# Patient Record
Sex: Female | Born: 1960 | Race: White | Hispanic: No | Marital: Married | Smoking: Never smoker
Health system: Southern US, Community
[De-identification: ages and names within clinical notes are randomized; demographics above are authoritative.]

## PROBLEM LIST (undated history)

## (undated) DIAGNOSIS — F419 Anxiety disorder, unspecified: Secondary | ICD-10-CM

## (undated) DIAGNOSIS — M26609 Unspecified temporomandibular joint disorder, unspecified side: Secondary | ICD-10-CM

## (undated) DIAGNOSIS — D649 Anemia, unspecified: Secondary | ICD-10-CM

## (undated) DIAGNOSIS — R079 Chest pain, unspecified: Secondary | ICD-10-CM

## (undated) DIAGNOSIS — K760 Fatty (change of) liver, not elsewhere classified: Secondary | ICD-10-CM

## (undated) DIAGNOSIS — IMO0001 Reserved for inherently not codable concepts without codable children: Secondary | ICD-10-CM

## (undated) DIAGNOSIS — I10 Essential (primary) hypertension: Secondary | ICD-10-CM

## (undated) DIAGNOSIS — K219 Gastro-esophageal reflux disease without esophagitis: Secondary | ICD-10-CM

## (undated) DIAGNOSIS — E7800 Pure hypercholesterolemia, unspecified: Secondary | ICD-10-CM

## (undated) HISTORY — PX: HERNIA REPAIR: SHX51

## (undated) HISTORY — DX: Pure hypercholesterolemia, unspecified: E78.00

## (undated) HISTORY — PX: SUBMANDIBULAR MASS EXCISION: SHX5310

## (undated) HISTORY — PX: ABDOMINAL HYSTERECTOMY: SHX81

## (undated) HISTORY — PX: KNEE SURGERY: SHX244

## (undated) HISTORY — PX: BILATERAL SALPINGOOPHORECTOMY: SHX1223

---

## 1994-03-14 HISTORY — PX: SUBMANDIBULAR MASS EXCISION: SHX5310

## 2004-05-12 ENCOUNTER — Ambulatory Visit: Payer: Self-pay | Admitting: Internal Medicine

## 2004-08-11 ENCOUNTER — Ambulatory Visit: Payer: Self-pay

## 2004-09-06 ENCOUNTER — Ambulatory Visit: Payer: Self-pay | Admitting: Unknown Physician Specialty

## 2005-08-02 ENCOUNTER — Ambulatory Visit: Payer: Self-pay | Admitting: Internal Medicine

## 2006-03-21 ENCOUNTER — Ambulatory Visit: Payer: Self-pay | Admitting: Gastroenterology

## 2006-03-29 ENCOUNTER — Other Ambulatory Visit: Payer: Self-pay

## 2006-04-04 ENCOUNTER — Ambulatory Visit: Payer: Self-pay | Admitting: Gastroenterology

## 2006-08-10 ENCOUNTER — Ambulatory Visit: Payer: Self-pay | Admitting: Internal Medicine

## 2007-03-15 HISTORY — PX: ABDOMINAL HYSTERECTOMY: SHX81

## 2007-03-15 HISTORY — PX: BILATERAL SALPINGOOPHORECTOMY: SHX1223

## 2007-04-18 ENCOUNTER — Ambulatory Visit: Payer: Self-pay | Admitting: Urology

## 2007-06-13 ENCOUNTER — Ambulatory Visit: Payer: Self-pay | Admitting: Oncology

## 2007-06-20 ENCOUNTER — Ambulatory Visit: Payer: Self-pay | Admitting: Oncology

## 2007-06-28 ENCOUNTER — Encounter: Admission: RE | Admit: 2007-06-28 | Discharge: 2007-06-28 | Payer: Self-pay | Admitting: Sports Medicine

## 2007-07-01 ENCOUNTER — Encounter: Admission: RE | Admit: 2007-07-01 | Discharge: 2007-07-01 | Payer: Self-pay | Admitting: Obstetrics and Gynecology

## 2007-07-13 ENCOUNTER — Ambulatory Visit: Payer: Self-pay | Admitting: Oncology

## 2007-07-23 ENCOUNTER — Encounter (INDEPENDENT_AMBULATORY_CARE_PROVIDER_SITE_OTHER): Payer: Self-pay | Admitting: Obstetrics and Gynecology

## 2007-07-23 ENCOUNTER — Inpatient Hospital Stay (HOSPITAL_COMMUNITY): Admission: RE | Admit: 2007-07-23 | Discharge: 2007-07-25 | Payer: Self-pay | Admitting: Obstetrics and Gynecology

## 2007-08-15 ENCOUNTER — Ambulatory Visit: Payer: Self-pay | Admitting: Internal Medicine

## 2007-08-22 ENCOUNTER — Ambulatory Visit: Payer: Self-pay | Admitting: Gastroenterology

## 2007-12-13 ENCOUNTER — Ambulatory Visit: Payer: Self-pay | Admitting: Oncology

## 2008-01-13 ENCOUNTER — Ambulatory Visit: Payer: Self-pay | Admitting: Oncology

## 2008-02-12 ENCOUNTER — Ambulatory Visit: Payer: Self-pay | Admitting: Oncology

## 2008-04-14 ENCOUNTER — Ambulatory Visit: Payer: Self-pay | Admitting: Oncology

## 2008-04-22 ENCOUNTER — Ambulatory Visit: Payer: Self-pay | Admitting: Oncology

## 2008-05-12 ENCOUNTER — Ambulatory Visit: Payer: Self-pay | Admitting: Oncology

## 2008-07-12 ENCOUNTER — Ambulatory Visit: Payer: Self-pay | Admitting: Oncology

## 2008-07-22 ENCOUNTER — Ambulatory Visit: Payer: Self-pay | Admitting: Oncology

## 2008-08-12 ENCOUNTER — Ambulatory Visit: Payer: Self-pay | Admitting: Oncology

## 2008-08-18 ENCOUNTER — Ambulatory Visit: Payer: Self-pay | Admitting: Internal Medicine

## 2009-03-14 ENCOUNTER — Ambulatory Visit: Payer: Self-pay | Admitting: Oncology

## 2009-03-24 ENCOUNTER — Ambulatory Visit: Payer: Self-pay | Admitting: Oncology

## 2009-04-14 ENCOUNTER — Ambulatory Visit: Payer: Self-pay | Admitting: Oncology

## 2010-01-25 ENCOUNTER — Ambulatory Visit: Payer: Self-pay | Admitting: Gastroenterology

## 2010-02-16 ENCOUNTER — Ambulatory Visit: Payer: Self-pay | Admitting: Surgery

## 2010-03-11 ENCOUNTER — Ambulatory Visit: Payer: Self-pay | Admitting: Internal Medicine

## 2010-04-04 ENCOUNTER — Encounter: Payer: Self-pay | Admitting: Obstetrics and Gynecology

## 2010-05-04 ENCOUNTER — Ambulatory Visit: Payer: Self-pay | Admitting: Gastroenterology

## 2010-05-06 LAB — PATHOLOGY REPORT

## 2010-05-10 ENCOUNTER — Ambulatory Visit: Payer: Self-pay | Admitting: Surgery

## 2010-05-18 ENCOUNTER — Ambulatory Visit: Payer: Self-pay | Admitting: Surgery

## 2010-07-27 NOTE — Op Note (Signed)
NAMELUEVENIA, MCAVOY                ACCOUNT NO.:  [id number]   MEDICAL RECORD NO.:  [id number]          PATIENT TYPE:  INP   LOCATION:  9319                          FACILITY:  [facility]   PHYSICIAN:  Gerald Leitz, MD          DATE OF BIRTH:  02/23/1961   DATE OF PROCEDURE:  07/23/2007  DATE OF DISCHARGE:                               OPERATIVE REPORT   PREOPERATIVE DIAGNOSIS:  Right adnexal mass.   POSTOPERATIVE DIAGNOSIS:  Right adnexal mass.   PROCEDURE:  Total abdominal hysterectomy and bilateral salpingo-  oophorectomy.   SURGEON:  Gerald Leitz, MD   ASSISTANT:  Bing Neighbors. Delcambre, MD   ANESTHESIA:  General.   FINDINGS:  A large 8-10 cm right ovarian mass.  The left ovary appears  normal.   SPECIMENS:  Bilateral ovaries, uterus, and cervix.   DISPOSITION OF SPECIMEN:  Pathology.  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  Final pathology is pending.   ESTIMATED BLOOD LOSS:  100 mL.   URINE OUTPUT:  450 mL.   FLUIDS:  One liter of lactated Ringer's.   COMPLICATIONS:  None.   PROCEDURE IN DETAIL:  The patient was taken to the operating room.  She  was placed under general anesthesia.  She was prepped and draped in the  usual sterile fashion.  Pfannenstiel skin incision was made with a  scalpel and carried down to the underlying layer of fascia.  The fascia  was incised in the midline and the incision was extended laterally with  Mayo scissors.  Superior aspect of the fascial incision was grasped with  Kocher clamps, elevated and the underlying rectus muscles were dissected  off with Mayo scissors.  This was repeated on the inferior aspect of the  fascial incision.  The rectus muscles were separated in the midline.  The peritoneum was identified, tented up, and entered sharply with  Metzenbaum scissors.  The pelvis was examined with the findings noted  above.  Pelvic washings were collected.  Balfour retractor was placed  into the incision and the bowel was  packed away with moist laparotomy  sponges.  Two Kelly clamps were placed on the cornua and used for  retractions.  The round ligament on the right was suture ligated with 0-  Vicryl and transected.  The ureter on the right was identified and the  infundibulopelvic ligament was clamped with a Heaney clamp.  The right  ovary and fallopian tube was excised and sent to pathology.  The  infundibulopelvic ligament was suture ligated with a free tie of 0-  Vicryl followed by suture ligature of 0-Vicryl and excellent hemostasis  was noted.  Attention was turned to the left round ligament which was  suture ligated with 0-Vicryl and then transected.  The anterior leaf of  the broad ligament was incised along with bladder flexion to the midline  in both sides.  The bladder was gently dissected up to lower uterine  segment and the cervix using a sponge stick.  Infundibulopelvic ligament  on the left was then doubly clamped, transected  and suture ligated with  free tie of 0-Vicryl followed by suture ligature of 0-Vicryl, excellent  hemostasis was noted.  The uterine arteries were skeletonized  bilaterally, clamped with Heaney clamp, transected and suture ligated  with 0-Vicryl.  The uterosacral ligaments were clamped on both sides,  transected and suture ligated in a similar fashion.  The cervix and  uterus were amputated with scissors.  The vaginal cuff angles were  closed with 0-Vicryl and transfixed to the ipsilateral uterosacral  ligament.  The remainder of the vaginal cuff was closed with 0-Vicryl in  a running locked fashion.  Excellent hemostasis was noted.  The pelvis  was irrigated copiously with warm normal saline.  Excellent hemostasis  was assured.  All laparotomy sponges and instruments were removed from  the abdomen.  The fascia was closed with 0-PDS and the skin was closed  with staples.  Sponge, lap, and needle counts were correct x3.  The  patient was taken to the recovery room and  awakened in the stable  condition.      Gerald Leitz, MD  Electronically Signed     TC/MEDQ  D:  07/23/2007  T:  07/24/2007  Job:  [id number]

## 2010-07-27 NOTE — Discharge Summary (Signed)
Evelyn James, Evelyn James                ACCOUNT NO.:  [id number]   MEDICAL RECORD NO.:  [id number]          PATIENT TYPE:  INP   LOCATION:  9319                          FACILITY:  [facility]   PHYSICIAN:  Gerald Leitz, MD          DATE OF BIRTH:  10/25/1960   DATE OF ADMISSION:  07/23/2007  DATE OF DISCHARGE:  07/25/2007                               DISCHARGE SUMMARY   ADMISSION DIAGNOSIS:  Right adnexal mass.   DISCHARGE DIAGNOSIS:  Right adnexal mass, status post total abdominal  hysterectomy and bilateral salpingo-oophorectomy.   BRIEF HOSPITAL COURSE:  The patient was admitted on Jul 23, 2007 after  undergoing a total abdominal hysterectomy and bilateral salpingo-  oophorectomy for right adnexal mass.  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  Final pathology is  pending.  The patient did well postoperatively.  Hemoglobin on postop  day #1 was 9.9.  She was discharged home in stable condition.  She is to  return to the office on Jul 25, 2007 at 2 p.m. for staple removal.   ACTIVITY AT HOME:  Ad lib.   DIET:  Diabetic.      Gerald Leitz, MD  Electronically Signed     TC/MEDQ  D:  07/25/2007  T:  07/25/2007  Job:  [id number]

## 2010-07-27 NOTE — H&P (Signed)
Evelyn James, Evelyn James NO.:  [id number]   MEDICAL RECORD NO.:  [id number]           PATIENT TYPE:   LOCATION:                                 FACILITY:   PHYSICIAN:  Gerald Leitz, MD          DATE OF BIRTH:  Apr 01, 1960   DATE OF ADMISSION:  DATE OF DISCHARGE:                              HISTORY & PHYSICAL   The patient scheduled for surgery on Jul 23, 2007.   HISTORY OF PRESENT ILLNESS:  This is a 50 year old G2, P2 with a right  adnexal mass measuring 8.6 x 7.4 x 7.3 cm. This is thought to be arising  from the right ovary.  She had a CA-125 of 8.1. The patient desires  removal of this mass.   PAST MEDICAL HISTORY:  1. Diabetes.  2. Gastroesophageal reflux disease.  3. Hypercholesterolemia.  4. Hypertension.  5. Anxiety.   OB HISTORY:  Spontaneous vaginal delivery x2.   GYN HISTORY:  Contraception:  Depo-Provera. No history of sexually  transmitted diseases.  No history of abnormal Pap smear.  Last Pap smear  in March 2009 was normal.   PAST SURGICAL HISTORY:  1. 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  2. Left arthroscopic knee surgery in July 2007.   MEDICATIONS:  1. Depo-Provera.  2. Nexium 40 mg b.i.d.  3. Metformin 1000 mg b.i.d.  4. Pravachol 20 mg q. day.  5. Lisinopril 20 mg q. day.  6. Lexapro 15 mg q. day.  7. Flexeril p.r.n.  8. Oxycodone p.r.n.   ALLERGIES:  SULFA.   SOCIAL HISTORY:  The patient is divorced.  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 The patient denies  tobacco, alcohol or illicit drug use.   FAMILY HISTORY:  Negative for breast, ovarian, and colon cancer.   REVIEW OF SYSTEMS:  Negative except as stated in history of current  illness.   PHYSICAL EXAMINATION:  VITAL SIGNS:  Blood pressure 120/72.  Weight 212  pounds.  Temperature 97.6.  CARDIOVASCULAR:  Regular rate and rhythm.  LUNGS: Clear to auscultation bilaterally.  ABDOMEN:  Soft, nontender, nondistended.  Positive  bowel sounds.  EXTREMITIES:  No clubbing, cyanosis or edema.  PELVIC:  Normal external female genitalia.  No vulvar, vaginal or  cervical lesions are noted.  Bimanual exam reveals right adnexal mass  and fullness. No tenderness.   IMPRESSION AND PLAN:  Large right adnexal mass thought to arise from the  right ovary.  The patient is scheduled for total abdominal hysterectomy  and bilateral salpingo-oophorectomy given the size of the mass.  Risks,  benefits, and alternatives of the surgery were discussed with the  patient including but not limited to infection, bleeding, damage to  bowel, bladder and surrounding organs with the need for further surgery.  She is aware that this mass is most likely benign. If it is found to be  malignant, she is advised that she may require staging at a later date.  She is aware that removal of her ovaries would result in surgical  menopause.  All of her questions were answered.  We also discussed risk  of transfusion, HIV, hepatitis B and C.  She voiced understanding of all  risks and desires to proceed with abdominal hysterectomy and bilateral  salpingo-oophorectomy.      Gerald Leitz, MD  Electronically Signed     TC/MEDQ  D:  07/22/2007  T:  07/22/2007  Job:  [id number]

## 2010-07-30 NOTE — Discharge Summary (Signed)
Evelyn James, Evelyn James                ACCOUNT NO.:  [id number]   MEDICAL RECORD NO.:  [id number]          PATIENT TYPE:  INP   LOCATION:  9319                          FACILITY:  [facility]   PHYSICIAN:  Gerald Leitz, MD          DATE OF BIRTH:  19-Jul-1960   DATE OF ADMISSION:  07/23/2007  DATE OF DISCHARGE:  07/25/2007                               DISCHARGE SUMMARY   ADMISSION DIAGNOSIS:  Right adnexal mass.   POSTOPERATIVE DIAGNOSES:  1. Right adnexal mass.  2. Status post total abdominal hysterectomy and bilateral salpingo-      oophorectomy.   BRIEF HOSPITAL COURSE:  The patient has noted an 8-10-cm right ovarian  mass and underwent total abdominal hysterectomy and bilateral salpingo-  oophorectomy on Jul 23, 2007.  Frozen section was sent, which showed a  benign mucinous adenoma.  She did well postoperatively.  On postop day  #1, she did receive routine postoperative care and was discharged home  on postop day #2 in stable condition on the following medications.  She  will followup in [facility] on Jul 27, 2007 for staple removal and  pelvic rest for 6 weeks.      Gerald Leitz, MD  Electronically Signed     TC/MEDQ  D:  09/04/2007  T:  09/05/2007  Job:  [id number]

## 2010-10-05 ENCOUNTER — Other Ambulatory Visit: Payer: Self-pay | Admitting: Dermatology

## 2011-02-16 LAB — HM PAP SMEAR

## 2011-05-17 ENCOUNTER — Ambulatory Visit: Payer: Self-pay | Admitting: Internal Medicine

## 2011-05-17 LAB — HM MAMMOGRAPHY

## 2011-06-01 ENCOUNTER — Ambulatory Visit: Payer: Self-pay | Admitting: Gastroenterology

## 2011-09-26 LAB — HM DIABETES EYE EXAM

## 2011-12-08 ENCOUNTER — Ambulatory Visit (HOSPITAL_COMMUNITY)
Admission: EM | Admit: 2011-12-08 | Discharge: 2011-12-09 | DRG: 125 | Disposition: A | Discharge status: Home / Self Care | Payer: BC Managed Care – PPO | Attending: Cardiology | Admitting: Cardiology

## 2011-12-08 ENCOUNTER — Emergency Department (HOSPITAL_COMMUNITY): Payer: BC Managed Care – PPO

## 2011-12-08 ENCOUNTER — Encounter (HOSPITAL_COMMUNITY): Payer: Self-pay | Admitting: Emergency Medicine

## 2011-12-08 DIAGNOSIS — I200 Unstable angina: Secondary | ICD-10-CM

## 2011-12-08 DIAGNOSIS — I10 Essential (primary) hypertension: Secondary | ICD-10-CM | POA: Diagnosis present

## 2011-12-08 DIAGNOSIS — Z881 Allergy status to other antibiotic agents status: Secondary | ICD-10-CM

## 2011-12-08 DIAGNOSIS — Z882 Allergy status to sulfonamides status: Secondary | ICD-10-CM

## 2011-12-08 DIAGNOSIS — E785 Hyperlipidemia, unspecified: Secondary | ICD-10-CM | POA: Diagnosis present

## 2011-12-08 DIAGNOSIS — D649 Anemia, unspecified: Secondary | ICD-10-CM | POA: Diagnosis present

## 2011-12-08 DIAGNOSIS — Z791 Long term (current) use of non-steroidal anti-inflammatories (NSAID): Secondary | ICD-10-CM

## 2011-12-08 DIAGNOSIS — R0789 Other chest pain: Principal | ICD-10-CM | POA: Diagnosis present

## 2011-12-08 DIAGNOSIS — R079 Chest pain, unspecified: Secondary | ICD-10-CM | POA: Diagnosis present

## 2011-12-08 DIAGNOSIS — K219 Gastro-esophageal reflux disease without esophagitis: Secondary | ICD-10-CM | POA: Diagnosis present

## 2011-12-08 DIAGNOSIS — Z6841 Body Mass Index (BMI) 40.0 and over, adult: Secondary | ICD-10-CM

## 2011-12-08 DIAGNOSIS — R002 Palpitations: Secondary | ICD-10-CM | POA: Diagnosis present

## 2011-12-08 DIAGNOSIS — E119 Type 2 diabetes mellitus without complications: Secondary | ICD-10-CM | POA: Diagnosis present

## 2011-12-08 DIAGNOSIS — M26609 Unspecified temporomandibular joint disorder, unspecified side: Secondary | ICD-10-CM | POA: Diagnosis present

## 2011-12-08 DIAGNOSIS — D72829 Elevated white blood cell count, unspecified: Secondary | ICD-10-CM | POA: Diagnosis present

## 2011-12-08 DIAGNOSIS — Z8249 Family history of ischemic heart disease and other diseases of the circulatory system: Secondary | ICD-10-CM

## 2011-12-08 DIAGNOSIS — Z7982 Long term (current) use of aspirin: Secondary | ICD-10-CM

## 2011-12-08 DIAGNOSIS — E663 Overweight: Secondary | ICD-10-CM | POA: Diagnosis present

## 2011-12-08 HISTORY — DX: Unspecified temporomandibular joint disorder, unspecified side: M26.609

## 2011-12-08 HISTORY — DX: Chest pain, unspecified: R07.9

## 2011-12-08 HISTORY — DX: Anemia, unspecified: D64.9

## 2011-12-08 HISTORY — DX: Reserved for inherently not codable concepts without codable children: IMO0001

## 2011-12-08 HISTORY — DX: Gastro-esophageal reflux disease without esophagitis: K21.9

## 2011-12-08 HISTORY — DX: Essential (primary) hypertension: I10

## 2011-12-08 LAB — CBC
Platelets: 261 10*3/uL (ref 150–400)
RBC: 4.39 MIL/uL (ref 3.87–5.11)
WBC: 11.9 10*3/uL — ABNORMAL HIGH (ref 4.0–10.5)

## 2011-12-08 LAB — BASIC METABOLIC PANEL
CO2: 27 mEq/L (ref 19–32)
Calcium: 10 mg/dL (ref 8.4–10.5)
Chloride: 101 mEq/L (ref 96–112)
Sodium: 140 mEq/L (ref 135–145)

## 2011-12-08 LAB — D-DIMER, QUANTITATIVE: D-Dimer, Quant: 0.27 ug/mL-FEU (ref 0.00–0.48)

## 2011-12-08 LAB — GLUCOSE, CAPILLARY: Glucose-Capillary: 93 mg/dL (ref 70–99)

## 2011-12-08 MED ORDER — ASPIRIN 325 MG PO TABS
325.0000 mg | ORAL_TABLET | ORAL | Status: AC
  Administered 2011-12-08: 325 mg via ORAL
  Filled 2011-12-08: qty 1

## 2011-12-08 NOTE — ED Notes (Signed)
Pt reports having dental work done about 8:20--received novacaine on L lower side for cavity filling--reports has got novacaine before, with no reaction--this time she said that dentist had to give her more than normal; since this AM, has been having chest tightness; denies SOB/n/v/diaphoresis

## 2011-12-08 NOTE — ED Notes (Signed)
States started having chest tightness today. Located center chest with no radiation. States has had tightness all day. Has had similar episodes, however, shorter in duration. Rates pain as 3/10

## 2011-12-08 NOTE — ED Notes (Signed)
i-stat troponin  .00

## 2011-12-08 NOTE — ED Notes (Signed)
i-Stat Troponin Results:  cTnl 0.00

## 2011-12-08 NOTE — ED Notes (Signed)
Wt 236  Ht 5\' 3" 

## 2011-12-08 NOTE — ED Provider Notes (Signed)
History     CSN: [id number]  Arrival date & time 12/08/11  1533   First MD Initiated Contact with Patient 12/08/11 2002      Chief Complaint  Patient presents with  . Chest Pain    (Consider location/radiation/quality/duration/timing/severity/associated sxs/prior treatment) HPI Pt states she started having central chest pressure starting at 0815 after several injections of local anesthetic for dental procedure. No SOB, throat swelling. + episodic nausea. Pain has since resolved. No lower ext swelling or pain. No prev CAD. Has had several negative stress test the most recent several years ago.  Past Medical History  Diagnosis Date  . Diabetes mellitus   . Hypertension   . Hyperlipidemia   . Reflux   . TMJ dysfunction   . Anemia     Noted 11/2011  . Chest pain     Admitted 11/2011: cath with mild nonobstructive coronary plaque, normal EF, negative cardiac enzymes and negative d-dimer.    Past Surgical History  Procedure Date  . Abdominal hysterectomy   . Submandibular mass excision   . Knee surgery     Family History  Problem Relation Age of Onset  . Heart disease Father     Father had CABG in his 72s  . Lung cancer Mother     History  Substance Use Topics  . Smoking status: Never Smoker   . Smokeless tobacco: Not on file  . Alcohol Use: No    OB History    Grav Para Term Preterm Abortions TAB SAB Ect Mult Living                  Review of Systems  Constitutional: Negative for fever and chills.  HENT: Negative for neck pain.   Respiratory: Negative for cough, shortness of breath, wheezing and stridor.   Cardiovascular: Positive for chest pain. Negative for palpitations and leg swelling.  Gastrointestinal: Positive for nausea. Negative for vomiting, abdominal pain and diarrhea.  Musculoskeletal: Negative for back pain.  Skin: Negative for rash and wound.  Neurological: Negative for dizziness, weakness, light-headedness, numbness and headaches.     Allergies  Sulfa antibiotics  Home Medications   Current Outpatient Rx  Name Route Sig Dispense Refill  . COLESTIPOL HCL 1 G PO TABS Oral Take 1 g by mouth 2 (two) times daily.    Marland Kitchen DICYCLOMINE HCL 10 MG PO CAPS Oral Take 10 mg by mouth 4 (four) times daily -  before meals and at bedtime.    Marland Kitchen ESCITALOPRAM OXALATE 10 MG PO TABS Oral Take 5 mg by mouth daily.    Marland Kitchen ESTROGENS CONJUGATED 0.45 MG PO TABS Oral Take 0.45 mg by mouth daily. Take daily for 21 days then do not take for 7 days.    Marland Kitchen LISINOPRIL 20 MG PO TABS Oral Take 20 mg by mouth daily.    Marland Kitchen NAPROXEN SODIUM 220 MG PO TABS Oral Take 220 mg by mouth daily as needed. For pain    . PANTOPRAZOLE SODIUM 40 MG PO TBEC Oral Take 40 mg by mouth daily.    Marland Kitchen PRAVASTATIN SODIUM 20 MG PO TABS Oral Take 20 mg by mouth daily.    . SUCRALFATE 1 G PO TABS Oral Take 1 g by mouth 4 (four) times daily.    . ASPIRIN 81 MG PO TBEC Oral Take 1 tablet (81 mg total) by mouth daily.      Many patients with diabetes and mild coronary plaq ...  . METFORMIN HCL 1000 MG PO  TABS Oral Take 1 tablet (1,000 mg total) by mouth 2 (two) times daily with a meal.      IMPORTANT: DO NOT RESTART UNTIL THE EVENING OF 9/2 ...    BP 133/68  Pulse 84  Temp 98.6 F (37 C) (Oral)  Resp 16  Ht 5\' 3"  (1.6 m)  Wt 235 lb 4.8 oz (106.731 kg)  BMI 41.68 kg/m2  SpO2 98%  Physical Exam  Nursing note and vitals reviewed. Constitutional: She is oriented to person, place, and time. She appears well-developed and well-nourished. No distress.  HENT:  Head: Normocephalic and atraumatic.  Mouth/Throat: Oropharynx is clear and moist.  Eyes: EOM are normal. Pupils are equal, round, and reactive to light.  Neck: Normal range of motion. Neck supple.  Cardiovascular: Normal rate and regular rhythm.   Pulmonary/Chest: Effort normal and breath sounds normal. No respiratory distress. She has no wheezes. She has no rales.  Abdominal: Soft. Bowel sounds are normal. She exhibits no  mass. There is no tenderness. There is no rebound and no guarding.  Musculoskeletal: Normal range of motion. She exhibits no edema and no tenderness.       No lower ext, calf swelling or tenderness  Neurological: She is alert and oriented to person, place, and time.  Skin: Skin is warm and dry. No rash noted. No erythema.  Psychiatric: She has a normal mood and affect. Her behavior is normal.    ED Course  Procedures (including critical care time)  Labs Reviewed  CBC - Abnormal; Notable for the following:    WBC 11.9 (*)     Hemoglobin 11.7 (*)     All other components within normal limits  BASIC METABOLIC PANEL - Abnormal; Notable for the following:    Glucose, Bld 104 (*)     All other components within normal limits  GLUCOSE, CAPILLARY - Abnormal; Notable for the following:    Glucose-Capillary 106 (*)     All other components within normal limits  GLUCOSE, CAPILLARY  D-DIMER, QUANTITATIVE  POCT I-STAT TROPONIN I  POCT I-STAT TROPONIN I  POCT I-STAT TROPONIN I  LAB REPORT - SCANNED   Dg Chest 2 View  12/08/2011  *RADIOLOGY REPORT*  Clinical Data: Chest tightness.  CHEST - 2 VIEW  Comparison: No priors.  Findings: Lung volumes are normal.  No consolidative airspace disease.  No pleural effusions.  No pneumothorax.  No pulmonary nodule or mass noted.  Pulmonary vasculature and the cardiomediastinal silhouette are within normal limits.  IMPRESSION: 1. No radiographic evidence of acute cardiopulmonary disease.   Original Report Authenticated By: Florencia Reasons, M.D.      1. Unstable angina      Date: 12/08/2011  Rate: 98  Rhythm: normal sinus rhythm  QRS Axis: normal  Intervals: normal  ST/T Wave abnormalities: nonspecific T wave changes  Conduction Disutrbances:none  Narrative Interpretation:   Old EKG Reviewed: none available Inverted T wave in V3   MDM          Loren Racer, MD 12/10/11 [phone]

## 2011-12-08 NOTE — ED Notes (Signed)
The pt has returned from c-t.  Pt alert no complaints of pain anywhere.  repalced on the monitor .Marland Kitchen Sign other at his bedside

## 2011-12-08 NOTE — ED Notes (Signed)
The pt has had chest tightness since this am when she was in the dentists office getting lidocaine.  At present she feels better but has some sl chest tightness.  She has had a stress test in the past but cardiac  Injury was ruled out.  Alert oriented skin warm and dry

## 2011-12-09 ENCOUNTER — Encounter (HOSPITAL_COMMUNITY): Payer: Self-pay | Admitting: Physician Assistant

## 2011-12-09 ENCOUNTER — Encounter (HOSPITAL_COMMUNITY): Admission: EM | Disposition: A | Discharge status: Home / Self Care | Payer: Self-pay | Source: Home / Self Care

## 2011-12-09 DIAGNOSIS — I200 Unstable angina: Secondary | ICD-10-CM

## 2011-12-09 DIAGNOSIS — R079 Chest pain, unspecified: Secondary | ICD-10-CM | POA: Diagnosis present

## 2011-12-09 HISTORY — PX: LEFT HEART CATHETERIZATION WITH CORONARY ANGIOGRAM: SHX5451

## 2011-12-09 LAB — POCT I-STAT TROPONIN I
Troponin i, poc: 0 ng/mL (ref 0.00–0.08)
Troponin i, poc: 0 ng/mL (ref 0.00–0.08)

## 2011-12-09 SURGERY — LEFT HEART CATHETERIZATION WITH CORONARY ANGIOGRAM
Anesthesia: LOCAL

## 2011-12-09 MED ORDER — MIDAZOLAM HCL 2 MG/2ML IJ SOLN
INTRAMUSCULAR | Status: AC
  Filled 2011-12-09: qty 2

## 2011-12-09 MED ORDER — ASPIRIN 81 MG PO CHEW
324.0000 mg | CHEWABLE_TABLET | ORAL | Status: AC
  Administered 2011-12-09: 324 mg via ORAL
  Filled 2011-12-09: qty 4

## 2011-12-09 MED ORDER — ACETAMINOPHEN 325 MG PO TABS: 650.0000 mg | ORAL_TABLET | ORAL | Status: DC | PRN

## 2011-12-09 MED ORDER — ASPIRIN 81 MG PO TBEC: 81.0000 mg | DELAYED_RELEASE_TABLET | Freq: Every day | ORAL | Status: DC

## 2011-12-09 MED ORDER — SODIUM CHLORIDE 0.9 % IV SOLN
INTRAVENOUS | Status: DC
  Administered 2011-12-09: 13:00:00 via INTRAVENOUS

## 2011-12-09 MED ORDER — SODIUM CHLORIDE 0.9 % IV SOLN: INTRAVENOUS | Status: DC

## 2011-12-09 MED ORDER — LISINOPRIL 20 MG PO TABS: 20.0000 mg | ORAL_TABLET | Freq: Every day | ORAL | Status: DC

## 2011-12-09 MED ORDER — PANTOPRAZOLE SODIUM 40 MG PO TBEC: 40.0000 mg | DELAYED_RELEASE_TABLET | Freq: Every day | ORAL | Status: DC

## 2011-12-09 MED ORDER — HEPARIN (PORCINE) IN NACL 2-0.9 UNIT/ML-% IJ SOLN
INTRAMUSCULAR | Status: AC
  Filled 2011-12-09: qty 1000

## 2011-12-09 MED ORDER — ESCITALOPRAM OXALATE 5 MG PO TABS: 5.0000 mg | ORAL_TABLET | Freq: Every day | ORAL | Status: DC

## 2011-12-09 MED ORDER — LIDOCAINE HCL (PF) 1 % IJ SOLN
INTRAMUSCULAR | Status: AC
  Filled 2011-12-09: qty 30

## 2011-12-09 MED ORDER — ONDANSETRON HCL 4 MG/2ML IJ SOLN: 4.0000 mg | Freq: Four times a day (QID) | INTRAMUSCULAR | Status: DC | PRN

## 2011-12-09 MED ORDER — FENTANYL CITRATE 0.05 MG/ML IJ SOLN
INTRAMUSCULAR | Status: AC
  Filled 2011-12-09: qty 2

## 2011-12-09 MED ORDER — METFORMIN HCL 1000 MG PO TABS: 1000.0000 mg | ORAL_TABLET | Freq: Two times a day (BID) | ORAL | Status: DC

## 2011-12-09 MED ORDER — DICYCLOMINE HCL 10 MG PO CAPS: 10.0000 mg | ORAL_CAPSULE | Freq: Three times a day (TID) | ORAL | Status: DC

## 2011-12-09 MED ORDER — COLESTIPOL HCL 1 G PO TABS: 1.0000 g | ORAL_TABLET | Freq: Two times a day (BID) | ORAL | Status: DC

## 2011-12-09 MED ORDER — ATORVASTATIN CALCIUM 10 MG PO TABS: 10.0000 mg | ORAL_TABLET | Freq: Every day | ORAL | Status: DC

## 2011-12-09 MED ORDER — SUCRALFATE 1 G PO TABS: 1.0000 g | ORAL_TABLET | Freq: Four times a day (QID) | ORAL | Status: DC

## 2011-12-09 MED ORDER — NITROGLYCERIN 0.2 MG/ML ON CALL CATH LAB
INTRAVENOUS | Status: AC
  Filled 2011-12-09: qty 1

## 2011-12-09 NOTE — CV Procedure (Signed)
  Cardiac Catheterization Procedure Note  Name: Evelyn James MRN: [id number] DOB: June 24, 1960  Procedure: Left Heart Cath, Selective Coronary Angiography, LV angiography  Indication:    Procedural details: The right radial was prepped, draped, and anesthetized with 1% lidocaine. Using modified Seldinger technique, a 5 French sheath was introduced into the right radial artery. Standard Judkins catheters were used for coronary angiography and left ventriculography. Catheter exchanges were performed over a guidewire. There were no immediate procedural complications. The patient was transferred to the post catheterization recovery area for further monitoring.  Procedural Findings:   Hemodynamics:     AO 139/79    LV 138/7   Coronary angiography:   Coronary dominance: Right  Left mainstem:   Normal  Left anterior descending (LAD):   Moderate sized.  Mild 25% long stenosis.  Mild nonobstructive apical disease.  Diagonal large and normal.    Left circumflex (LCx):  AV groove normal.  OM1 small and normal.  OM2 moderated sized and normal.  Posterolateral small and normal.  Right coronary artery (RCA):  Large.  Normal.  Moderate sized PDA with minimal distal plaque.  PL modere to small sized and normal.  Left ventriculography: Left ventricular systolic function is normal, LVEF is estimated at 55%, there is no significant mitral regurgitation   Final Conclusions:  Mild coronary plaque.  Normal LV function.    Recommendations: No further cardiac workup.  Continue with primary risk reduction.   Rollene Rotunda 12/09/2011, 1:59 PM

## 2011-12-09 NOTE — ED Provider Notes (Signed)
Medical screening examination/treatment/procedure(s) were performed by non-physician practitioner and as supervising physician I was immediately available for consultation/collaboration.   Celene Kras, MD 12/09/11 [phone]

## 2011-12-09 NOTE — H&P (Signed)
CARDIOLOGY H&P  Patient ID: Evelyn James, MRN: [id number], DOB/AGE: 1960-04-06 51 y.o. Admit date: 12/08/2011   Date of Consult: 12/09/2011 Primary Physician: Verna Czech Primary Cardiologist: None, new  Chief Complaint: chest pain Reason for Consult: chest pain, abnormal EKG  HPI: Ms. Evelyn James is a 51 y/o F with no prior cardiac hx but DMx10 yrs, HTN, HL, family hx of CAD who presented to [facility] with complaints of chest pain. She was at her dentist's office yesterday for a filling and after being given the numbing medication began to develop transient chest tightness and trembling. This eased off, but during her procedure she required more numbing medicine and again developed the same sensation. This too spontaneously resolved after a few minutes. She went on to work then developed recurrent chest pressure with associated weakness. She checked her BP which was 228/91 (unusual for her), HR was in the 70s. She became concerned so came to the ER. BP was 152/76 on arrival. Her CP lasted from about 3:30pm-6pm at its most prominent, and gradually eased off to nothing by 11pm. She had mild nausea but denied any associated SOB, diaphoresis, palpitations, presyncope, or syncope. Troponins neg x 3, d-dimer negative. WBC slightly elevated at 11.9, Hgb slightly decreased at 11.9. No complaints of bleeding. This AM she noted she was still having some low grade residual pressure. Her pain was made somewhat worse by leaning over, possibly relieved by leaning back but she isn't sure. She does endorse some left breast tenderness that is different than her CP and she attributes this to her fibrocystic breast disease. She also has had some R sided jaw pain the last several weeks but she thought this was due to TMJ dysfunction. She also report more frequent chest pressure similar to her initial presenting CP that occurs with emotional stress and is relieved by calming down.  In the past, she has had sensation of  racing heart with associated diaphoresis and weakness that sometimes initiates abruptly after bending over or lifting something heavy. The palpitations have also occurred in the context of just generally walking around. This CP was unlike those episodes. She was able to do the elliptical 1 week ago at 5 minute intervals without CP or SOB but hasn't been back due to work constraints.   Initially we were called to proctor stress echo testing on her but this was deferred given EKG changes between her various tracings. Admit EKG 9/26:  NSR 98bpm with TWI III, avF, V3; low voltage QRS. Early AM EKG today: while still having chest pressure showed NSR 86bpm with again TWI III, avF, V3. Pre-stress-echo tracing while standing: NSR 95bpm with TWI in III, V2-V5 with 0.46mm ST depression in V3. Pre-stress echo tracing while supine: NSR 81bpm with resolution of inferior TWI but did show residual 0.14mm ST depression in V3.  Past Medical History  Diagnosis Date  . Diabetes mellitus   . Hypertension   . Hyperlipidemia   . Reflux   . TMJ dysfunction       Most Recent Cardiac Studies: Stress test 2 yrs ago which pt states was [facility] at [facility]   Surgical History:  Past Surgical History  Procedure Date  . Abdominal hysterectomy   . Submandibular mass excision   . Knee surgery      Home Meds: Prior to Admission medications   Medication Sig Start Date End Date Taking? Authorizing Provider  colestipol (COLESTID) 1 G tablet Take 1 g by mouth 2 (two) times  daily.   Yes Historical Provider, MD  dicyclomine (BENTYL) 10 MG capsule Take 10 mg by mouth 4 (four) times daily -  before meals and at bedtime.   Yes Historical Provider, MD  escitalopram (LEXAPRO) 10 MG tablet Take 5 mg by mouth daily.   Yes Historical Provider, MD  estrogens, conjugated, (PREMARIN) 0.45 MG tablet Take 0.45 mg by mouth daily. Take daily for 21 days then do not take for 7 days.   Yes Historical Provider, MD  lisinopril (PRINIVIL,ZESTRIL)  20 MG tablet Take 20 mg by mouth daily.   Yes Historical Provider, MD  metFORMIN (GLUCOPHAGE) 1000 MG tablet Take 1,000 mg by mouth 2 (two) times daily with a meal.   Yes Historical Provider, MD  naproxen sodium (ANAPROX) 220 MG tablet Take 220 mg by mouth daily as needed. For pain   Yes Historical Provider, MD  pantoprazole (PROTONIX) 40 MG tablet Take 40 mg by mouth daily.   Yes Historical Provider, MD  pravastatin (PRAVACHOL) 20 MG tablet Take 20 mg by mouth daily.   Yes Historical Provider, MD  sucralfate (CARAFATE) 1 G tablet Take 1 g by mouth 4 (four) times daily.   Yes Historical Provider, MD    Inpatient Medications:     . aspirin  325 mg Oral STAT    Allergies:  Allergies  Allergen Reactions  . Sulfa Antibiotics Other (See Comments)    Topical--red itchy; oral--makes mouth raw    History   Social History  . Marital Status: Divorced    Spouse Name: N/A    Number of Children: N/A  . Years of Education: N/A   Occupational History  . Not on file.   Social History Main Topics  . Smoking status: Never Smoker   . Smokeless tobacco: Not on file  . Alcohol Use: No  . Drug Use: No  . Sexually Active:    Other Topics Concern  . Not on file   Social History Narrative  . No narrative on file     Family History  Problem Relation Age of Onset  . Heart disease Father     Father had CABG in his 30s  . Lung cancer Mother      Review of Systems: General: negative for chills, fever, night sweats or weight changes.  Cardiovascular: negative for edema, orthopnea, paroxysmal nocturnal dyspnea, shortness of breath or dyspnea on exertion. Otherwise see above Dermatological: negative for rash Respiratory: negative for cough or wheezing Urologic: negative for hematuria Abdominal: negative for nausea, vomiting, diarrhea, bright red blood per rectum, melena, or hematemesis Neurologic: negative for visual changes, syncope, or dizziness All other systems reviewed and are  otherwise negative except as noted above.  Labs: Trop neg x 3 Lab Results  Component Value Date   WBC 11.9* 12/08/2011   HGB 11.7* 12/08/2011   HCT 36.7 12/08/2011   MCV 83.6 12/08/2011   PLT 261 12/08/2011     Lab 12/08/11 1546  NA 140  K 4.0  CL 101  CO2 27  BUN 12  CREATININE 0.57  CALCIUM 10.0  PROT --  BILITOT --  ALKPHOS --  ALT --  AST --  GLUCOSE 104*    Lab Results  Component Value Date   DDIMER 0.27 12/08/2011    Radiology/Studies:  Dg Chest 2 View 12/08/2011  *RADIOLOGY REPORT*  Clinical Data: Chest tightness.  CHEST - 2 VIEW  Comparison: No priors.  Findings: Lung volumes are normal.  No consolidative airspace disease.  No pleural  effusions.  No pneumothorax.  No pulmonary nodule or mass noted.  Pulmonary vasculature and the cardiomediastinal silhouette are within normal limits.  IMPRESSION: 1. No radiographic evidence of acute cardiopulmonary disease.   Original Report Authenticated By: Florencia Reasons, M.D.     EKG:  Admit EKG 9/26:  NSR 98bpm with TWI III, avF, V3; low voltage QRS.  Early AM EKG today: while still having chest pressure showed NSR 86bpm with again TWI III, avF, V3. Pre-stress-echo tracing while standing: NSR 95bpm with TWI in III, V2-V5 with 0.26mm ST depression in V3. Pre-stress echo tracing while supine: NSR 81bpm with resolution of inferior TWI but did show residual 0.39mm ST depression in V3  Physical Exam: Blood pressure 126/67, pulse 83, temperature 98.7 F (37.1 C), temperature source Oral, resp. rate 13, height 5\' 3"  (1.6 m), weight 235 lb 4.8 oz (106.731 kg), SpO2 97.00%. General: Well developed, well nourished overweight WF in no acute distress. Head: Normocephalic, atraumatic, sclera non-icteric, no xanthomas, nares are without discharge.  Neck: Negative for carotid bruits. JVD not elevated. Lungs: Clear bilaterally to auscultation without wheezes, rales, or rhonchi. Breathing is unlabored. Heart: RRR with S1 S2. No murmurs, rubs,  or gallops appreciated. Abdomen: Soft, non-tender, non-distended with normoactive bowel sounds. No hepatomegaly. No rebound/guarding. No obvious abdominal masses. Msk:  Strength and tone appear normal for age. Extremities: No clubbing or cyanosis. No edema.  Distal pedal pulses are 2+ and equal bilaterally. Neuro: Alert and oriented X 3. No facial asymmetry. No focal deficit. Moves all extremities spontaneously. Psych:  Responds to questions appropriately with a normal affect.   Assessment and Plan:   1. Chest pain with typical/atypical features with dynamic EKG changes, concerning for [place] 2. Intermittent palpitations independent of #1, ?SVT 3. HTN, accelerated yesterday, currently controlled 4. Hyperlipidemia, on Pravastatin 5. NIDDM, on Metformin 6. Family hx of CAD  She describes both typical and atypical features for cardiac CP. She has had some changes between EKG tracings while in the ED. Cardiac risk factors include HTN, HL, diabetes for 10 years, and family history of CAD. Will discuss chest pain with MD - do agree she needs further cardiac testing. Her occasional palpitations sound independent of this chest pain and if they recur, she may benefit from event monitoring to r/o transient arrhythmia.  Signed, Ronie Spies PA-C 12/09/2011, 11:36 AM  History and all data above reviewed.  Patient examined.  I agree with the findings as above.  The patient has pain with typical and atypical features.  She has dynamic EKG changes.  Her pain has been at rest yesterday and today and increasing in frequency and severity with emotional stress.  This would be considered unstable angina.  She has significant risk factors including diabetes.  She was to have a dobutamine echo ordered by the ER.  However, the pretest probability is too high making this a higher risk procedure. The patient exam reveals COR:RRR, no rub  ,  Lungs: Clear  ,  Abd: Positive bowel sounds, no rebound no guarding, Ext No edema  .   All available labs, radiology testing, previous records reviewed. Agree with documented assessment and plan. According to ACC/AHA guidelines and appropriate use criteria cardiac catheterization is indicated.  The patient understands that risks included but are not limited to stroke (1 in 1000), death (1 in 1000), kidney failure [usually temporary] (1 in 500), bleeding (1 in 200), allergic reaction [possibly serious] (1 in 200).  The patient understands and agrees to proceed.  Fayrene Fearing Adelae Yodice  12:37 PM  12/09/2011

## 2011-12-09 NOTE — ED Notes (Signed)
i-Stat Troponin Results:  cTnl 0.00 ng/mL

## 2011-12-09 NOTE — Discharge Summary (Signed)
Discharge Summary   Patient ID: Evelyn James MRN: [id number], DOB/AGE: 1960-10-12 51 y.o. Admit date: 12/08/2011 D/C date:     12/09/2011  Primary Cardiologist: seen by Dr. Antoine Poche this admission  Primary Discharge Diagnoses:  1. Chest pain, noncardiac - cardiac cath with mild coronary plaque, normal LV function 2. Palpitations - would recommend OP cardiac monitoring if they recur 3. Mild anemia  Secondary Discharge Diagnoses:  1. Non-insulin-dependent diabetes mellitus 2. HTN 3. Hyperlipidemia 4. H/o acid reflux 5. TMJ dysfunction  Hospital Course: Evelyn James is a 51 y/o F with hx of HTN, HL, DM, family history of CAD who presented to [facility] with complaints of chest pain. This first occurred while undergoing a dental procedure, spontaneously resolved, then recurred while she was at work. Please see H&P for full details of HPI. She also had increasing chest pressure with emotional stress over the last few weeks. She denied any SOB, diaphoresis, vomiting, or syncope. She did have mild nausea and weakness. In the past she has also had palpitations/racing heart on occasions that tended to occur while bending over or lifting something heavy, but she has not had this recently with the presenting chief complaint. She was able to do the elliptical 1 week ago without symptoms but had not been back to the gym due to work constraints. CE's were negative x 3 and d-dimer was negative. Initially the plan was for stress echocardiogram. However, this morning it was noted that she had dynamic EKG changes between several of the tracings she had had thus far. It was unclear if these were lead placement/impedence issues or truly related to coronary ischemia. She was not felt to be a candidate for cardiac CT as images were felt to be less accurate given her habitus. Due to continued intermittent chest pressure and cardiac risk factors, cardiac cath was recommended. This demonstrated only mild  nonobstructive coronary plaque. Primary risk reduction was recommended. No further cardiac workup was planned. Dr. Antoine Poche has seen and examined the patient and feels she is stable for discharge.  She had a mild leukocytosis felt secondary to her dental procedure. She was afebrile and CXR was without infection. Of note she was also mildly anemic this admission with Hgb 11.7. She denied any overt obvious sources of bleeding including melena, hematemesis, hematuria or BRBPR. She was instructed to f/u with her PCP regarding this. Given this finding, she was instructed to talk to her PCP about starting low dose aspirin regimen. She was also told to let her doctor know if she has recurrent heart racing/palpitations as she may benefit from event monitoring.  Discharge Vitals: Blood pressure 139/79, pulse 77, temperature 98.3 F (36.8 C), temperature source Oral, resp. rate 18, height 5\' 3"  (1.6 m), weight 235 lb 4.8 oz (106.731 kg), SpO2 96.00%.  Labs: Lab Results  Component Value Date   WBC 11.9* 12/08/2011   HGB 11.7* 12/08/2011   HCT 36.7 12/08/2011   MCV 83.6 12/08/2011   PLT 261 12/08/2011     Lab 12/08/11 1546  NA 140  K 4.0  CL 101  CO2 27  BUN 12  CREATININE 0.57  CALCIUM 10.0  PROT --  BILITOT --  ALKPHOS --  ALT --  AST --  GLUCOSE 104*    Lab Results  Component Value Date   DDIMER 0.27 12/08/2011    Diagnostic Studies/Procedures   Dg Chest 2 View 12/08/2011  *RADIOLOGY REPORT*  Clinical Data: Chest tightness.  CHEST - 2 VIEW  Comparison: No priors.  Findings: Lung volumes are normal.  No consolidative airspace disease.  No pleural effusions.  No pneumothorax.  No pulmonary nodule or mass noted.  Pulmonary vasculature and the cardiomediastinal silhouette are within normal limits.  IMPRESSION: 1. No radiographic evidence of acute cardiopulmonary disease.   Original Report Authenticated By: Florencia Reasons, M.D.     Discharge Medications   Current Discharge Medication List     START taking these medications   Details  aspirin 81 MG EC tablet Take 1 tablet (81 mg total) by mouth daily.   Comments: Many patients with diabetes and mild coronary plaque would benefit from being on a low-dose aspirin daily. However, with your mild anemia and history of acid reflux, please contact your primary doctor to make sure they approve of you starting a low-dose aspirin regimen.      CONTINUE these medications which have CHANGED   Details  metFORMIN (GLUCOPHAGE) 1000 MG tablet Take 1 tablet (1,000 mg total) by mouth 2 (two) times daily with a meal.   Comments: IMPORTANT: DO NOT RESTART UNTIL THE EVENING OF 12/11/11.      CONTINUE these medications which have NOT CHANGED   Details  colestipol (COLESTID) 1 G tablet Take 1 g by mouth 2 (two) times daily.    dicyclomine (BENTYL) 10 MG capsule Take 10 mg by mouth 4 (four) times daily -  before meals and at bedtime.    escitalopram (LEXAPRO) 10 MG tablet Take 5 mg by mouth daily.    estrogens, conjugated, (PREMARIN) 0.45 MG tablet Take 0.45 mg by mouth daily. Take daily for 21 days then do not take for 7 days.    lisinopril (PRINIVIL,ZESTRIL) 20 MG tablet Take 20 mg by mouth daily.    naproxen sodium (ANAPROX) 220 MG tablet Take 220 mg by mouth daily as needed. For pain    pantoprazole (PROTONIX) 40 MG tablet Take 40 mg by mouth daily.    pravastatin (PRAVACHOL) 20 MG tablet Take 20 mg by mouth daily.    sucralfate (CARAFATE) 1 G tablet Take 1 g by mouth 4 (four) times daily.        Disposition   The patient will be discharged in stable condition to home. Discharge Orders    Future Orders Please Complete By Expires   Diet - low sodium heart healthy      Comments:   Diabetic diet   Increase activity slowly      Comments:   No driving for 2 days. No lifting over 5 lbs for 1 week. No sexual activity for 1 week. You may return to work on 12/12/11. Keep procedure site clean & dry. If you notice increased pain,  swelling, bleeding or pus, call/return!  You may shower, but no soaking baths/hot tubs/pools for 1 week.     Follow-up Information    Follow up with Primary Care Doctor. (Your hemoglobin was mildly low at 11.7. Please follow up with primary doctor for anemia as well as to evaluate for other causes of chest pain if it happens again. Also, please notify your doctor if you have recurrent heart palpitations/racing.)            Duration of Discharge Encounter: Greater than 30 minutes including physician and PA time.  Signed, Ronie Spies PA-C 12/09/2011, 2:58 PM

## 2011-12-09 NOTE — ED Provider Notes (Signed)
51 year old female with hypertension, hyperlipidemia and diabetes in CDU on chest pain protocol. Currently she denies any chest pain, shortness of breath, nausea, vomiting, diaphoresis, headache, leg swelling. She has seen a cardiologist in the past for chest pain who performed a stress test which was negative. She is a nonsmoker. Denies any significant family history of heart disease. On exam patient is resting comfortably in bed and in NAD. She is AAO x3. Heart RRR. Lungs CTA A&P bilateral. No extremity edema. Distal pulses intact. Her skin is warm and dry. She is a normal mood and affect. Awaiting stress test this morning. 12:40 PM Patient being admitted for catheterization.  GERDA ARAKELYAN, PA-C 12/09/11 1240

## 2012-02-27 ENCOUNTER — Encounter: Payer: Self-pay | Admitting: Internal Medicine

## 2012-02-27 ENCOUNTER — Ambulatory Visit (INDEPENDENT_AMBULATORY_CARE_PROVIDER_SITE_OTHER): Payer: BC Managed Care – PPO | Admitting: Internal Medicine

## 2012-02-27 VITALS — BP 119/79 | HR 86 | Temp 99.0°F | Ht 64.0 in | Wt 236.0 lb

## 2012-02-27 DIAGNOSIS — R5381 Other malaise: Secondary | ICD-10-CM

## 2012-02-27 DIAGNOSIS — I2510 Atherosclerotic heart disease of native coronary artery without angina pectoris: Secondary | ICD-10-CM

## 2012-02-27 DIAGNOSIS — E119 Type 2 diabetes mellitus without complications: Secondary | ICD-10-CM

## 2012-02-27 DIAGNOSIS — G4730 Sleep apnea, unspecified: Secondary | ICD-10-CM

## 2012-02-27 DIAGNOSIS — R079 Chest pain, unspecified: Secondary | ICD-10-CM

## 2012-02-27 DIAGNOSIS — I10 Essential (primary) hypertension: Secondary | ICD-10-CM

## 2012-02-27 DIAGNOSIS — Z139 Encounter for screening, unspecified: Secondary | ICD-10-CM

## 2012-02-27 DIAGNOSIS — E1159 Type 2 diabetes mellitus with other circulatory complications: Secondary | ICD-10-CM | POA: Insufficient documentation

## 2012-02-27 DIAGNOSIS — D649 Anemia, unspecified: Secondary | ICD-10-CM | POA: Insufficient documentation

## 2012-02-27 DIAGNOSIS — E7800 Pure hypercholesterolemia, unspecified: Secondary | ICD-10-CM | POA: Insufficient documentation

## 2012-02-27 DIAGNOSIS — R5383 Other fatigue: Secondary | ICD-10-CM

## 2012-02-27 LAB — CBC WITH DIFFERENTIAL/PLATELET
Eosinophils Relative: 2 % (ref 0.0–5.0)
HCT: 35.3 % — ABNORMAL LOW (ref 36.0–46.0)
Hemoglobin: 11.3 g/dL — ABNORMAL LOW (ref 12.0–15.0)
Lymphs Abs: 2.2 10*3/uL (ref 0.7–4.0)
Monocytes Relative: 3.4 % (ref 3.0–12.0)
Neutro Abs: 6.5 10*3/uL (ref 1.4–7.7)
WBC: 9.2 10*3/uL (ref 4.5–10.5)

## 2012-02-27 LAB — HEPATIC FUNCTION PANEL
AST: 20 U/L (ref 0–37)
Albumin: 3.8 g/dL (ref 3.5–5.2)
Alkaline Phosphatase: 63 U/L (ref 39–117)
Bilirubin, Direct: 0.1 mg/dL (ref 0.0–0.3)
Total Protein: 7 g/dL (ref 6.0–8.3)

## 2012-02-27 LAB — FERRITIN: Ferritin: 13.8 ng/mL (ref 10.0–291.0)

## 2012-02-27 LAB — TSH: TSH: 1.26 u[IU]/mL (ref 0.35–5.50)

## 2012-02-27 LAB — BASIC METABOLIC PANEL
BUN: 13 mg/dL (ref 6–23)
Chloride: 100 mEq/L (ref 96–112)
Creatinine, Ser: 0.6 mg/dL (ref 0.4–1.2)
Glucose, Bld: 137 mg/dL — ABNORMAL HIGH (ref 70–99)

## 2012-02-27 LAB — LDL CHOLESTEROL, DIRECT: Direct LDL: 140.9 mg/dL

## 2012-02-27 LAB — LIPID PANEL: VLDL: 33.4 mg/dL (ref 0.0–40.0)

## 2012-02-27 MED ORDER — CYCLOBENZAPRINE HCL 10 MG PO TABS: ORAL_TABLET | ORAL | Status: DC

## 2012-02-27 MED ORDER — PRAVASTATIN SODIUM 20 MG PO TABS: 20.0000 mg | ORAL_TABLET | Freq: Every day | ORAL | Status: DC

## 2012-02-27 MED ORDER — PANTOPRAZOLE SODIUM 40 MG PO TBEC: 40.0000 mg | DELAYED_RELEASE_TABLET | Freq: Every day | ORAL | Status: DC

## 2012-02-27 MED ORDER — METFORMIN HCL 1000 MG PO TABS: 1000.0000 mg | ORAL_TABLET | Freq: Two times a day (BID) | ORAL | Status: DC

## 2012-02-27 MED ORDER — SE-TAN PLUS 162-115.2-1 MG PO CAPS: 1.0000 | ORAL_CAPSULE | Freq: Every day | ORAL | Status: DC

## 2012-02-27 MED ORDER — ESTROGENS CONJUGATED 0.45 MG PO TABS: 0.4500 mg | ORAL_TABLET | Freq: Every day | ORAL | Status: DC

## 2012-02-27 MED ORDER — LISINOPRIL 20 MG PO TABS: 20.0000 mg | ORAL_TABLET | Freq: Every day | ORAL | Status: DC

## 2012-02-27 MED ORDER — GLUCOSE BLOOD VI STRP: ORAL_STRIP | Status: DC

## 2012-02-27 MED ORDER — ESCITALOPRAM OXALATE 10 MG PO TABS: ORAL_TABLET | ORAL | Status: DC

## 2012-02-27 MED ORDER — FLUTICASONE PROPIONATE 50 MCG/ACT NA SUSP: 2.0000 | Freq: Every day | NASAL | Status: DC

## 2012-02-28 ENCOUNTER — Telehealth: Payer: Self-pay | Admitting: Internal Medicine

## 2012-02-28 ENCOUNTER — Encounter: Payer: Self-pay | Admitting: Internal Medicine

## 2012-02-28 MED ORDER — PRAVASTATIN SODIUM 40 MG PO TABS: 40.0000 mg | ORAL_TABLET | Freq: Every day | ORAL | Status: DC

## 2012-02-28 NOTE — Telephone Encounter (Signed)
Pt notified of labs via my chart messaging.  

## 2012-02-28 NOTE — Telephone Encounter (Signed)
Order sent to pharmacy for increased pravastatin dose (increased to 40mg  q day).

## 2012-03-04 ENCOUNTER — Encounter: Payer: Self-pay | Admitting: Internal Medicine

## 2012-03-04 NOTE — Progress Notes (Signed)
Subjective:    Patient ID: Evelyn James, female    DOB: Mar 20, 1960, 51 y.o.   MRN: [id number]  HPI 51 year old female with past history of diabetes, hypercholesterolemia, hypertension and reoccurring allergy problems who comes in today to follow up on these issues as well as for a complete physical exam.  She states she has been doing relatively well.  States blood sugars in the am averaging 120s adn pm sugars 150-170.  She has joined a gym.  Plans to get more serious about exercise.  States she previously went to the ER with chest pressure.  W/up included a heart cath.  States she had minimal build up.  Continues to see cardiology.  No significant chest pain or pressure since. She does have some occasional acid reflux.  Takes protonix.  She is taking one iron per day.  Bowels stable.    Past Medical History  Diagnosis Date  . Diabetes mellitus   . Hypertension   . Hypercholesterolemia   . Reflux   . TMJ dysfunction   . Anemia     Noted 11/2011  . Chest pain     Admitted 11/2011: cath with mild nonobstructive coronary plaque, normal EF, negative cardiac enzymes and negative d-dimer.    Current Outpatient Prescriptions on File Prior to Visit  Medication Sig Dispense Refill  . cetirizine (ZYRTEC) 10 MG tablet Take 10 mg by mouth daily.      . colestipol (COLESTID) 1 G tablet Take 1 g by mouth 2 (two) times daily.      Marland Kitchen dicyclomine (BENTYL) 10 MG capsule Take 10 mg by mouth 4 (four) times daily -  before meals and at bedtime.      Marland Kitchen escitalopram (LEXAPRO) 10 MG tablet Take 2 tablets per day  60 tablet  5  . estrogens, conjugated, (PREMARIN) 0.45 MG tablet Take 1 tablet (0.45 mg total) by mouth daily.  30 tablet  5  . fluticasone (FLOVENT DISKUS) 50 MCG/BLIST diskus inhaler Inhale 1 puff into the lungs 2 (two) times daily.      Marland Kitchen lisinopril (PRINIVIL,ZESTRIL) 20 MG tablet Take 1 tablet (20 mg total) by mouth daily.  90 tablet  3  . metFORMIN (GLUCOPHAGE) 1000 MG tablet Take 1 tablet (1,000  mg total) by mouth 2 (two) times daily with a meal.  180 tablet  3  . naproxen sodium (ANAPROX) 220 MG tablet Take 220 mg by mouth daily as needed. For pain      . pantoprazole (PROTONIX) 40 MG tablet Take 1 tablet (40 mg total) by mouth daily.  90 tablet  3  . sucralfate (CARAFATE) 1 G tablet Take 1 g by mouth 4 (four) times daily.      Marland Kitchen aspirin 81 MG EC tablet Take 1 tablet (81 mg total) by mouth daily.      . fluticasone (FLONASE) 50 MCG/ACT nasal spray Place 2 sprays into the nose daily.  16 g  5    Review of Systems Patient denies any headache, lightheadedness or dizziness.  No significant sinus or allergy symptoms.  No chest pain, tightness or palpitations.  No increased shortness of breath, cough or congestion.  No nausea or vomiting.  Occasional acid reflux.  No abdominal pain or cramping.  No bowel change, such as diarrhea, constipation, BRBPR or melana.  No urine change.        Objective:   Physical Exam Filed Vitals:   02/27/12 0806  BP: 119/79  Pulse: 86  Temp:  99 F (37.2 C)   Blood pressure recheck:  63/32  51 year old female in no acute distress.   HEENT:  Nares- clear.  Oropharynx - without lesions. NECK:  Supple.  Nontender.  No audible bruit.  HEART:  Appears to be regular. LUNGS:  No crackles or wheezing audible.  Respirations even and unlabored.  RADIAL PULSE:  Equal bilaterally.    BREASTS:  No nipple discharge or nipple retraction present.  Could not appreciate any distinct nodules or axillary adenopathy.  ABDOMEN:  Soft, nontender.  Bowel sounds present and normal.  No audible abdominal bruit.  GU:  Normal external genitalia.  Vaginal vault without lesions.  S/p hysterectomy.  Could not appreciate any adnexal masses or tenderness.   RECTAL:  Heme negative.   EXTREMITIES:  No increased edema present.  DP pulses palpable and equal bilaterally.           Assessment & Plan:  GI.  Colonoscopy 05/04/10 negative for colitis.  Two adenomas removed.  EGD revealed  gastritis.  Bx negative for sprue, Barretts and H. Pylori.  Saw Dr Katrinka Blazing.  Had abdominal ultrasound, HIDA (normal function) and MRI liver - normal.  Had hernia repair 05/18/10.   On Protonix.  Follow.  Overall doing better.   GYN.  S/p hysterectomy and BSO.  Follow.    INCREASED PSYCHOSOCIAL STRESSORS.  Stable.    HEALTH MAINTENANCE.  Physical today.  She is s/p hysterectomy.  GI as outlined above.  Mammogram 05/17/11 - BiRADS II.

## 2012-03-05 DIAGNOSIS — I25118 Atherosclerotic heart disease of native coronary artery with other forms of angina pectoris: Secondary | ICD-10-CM | POA: Insufficient documentation

## 2012-03-05 DIAGNOSIS — I2510 Atherosclerotic heart disease of native coronary artery without angina pectoris: Secondary | ICD-10-CM | POA: Insufficient documentation

## 2012-03-05 DIAGNOSIS — G4730 Sleep apnea, unspecified: Secondary | ICD-10-CM | POA: Insufficient documentation

## 2012-03-05 NOTE — Assessment & Plan Note (Signed)
Continue CPAP.  

## 2012-03-05 NOTE — Assessment & Plan Note (Signed)
Sugars as outlined.  Low carb diet and exercise.  Follow.  Check met b and a1c.

## 2012-03-05 NOTE — Assessment & Plan Note (Signed)
Blood pressure under good control.  Same meds.  Check metabolic panel.  

## 2012-03-05 NOTE — Assessment & Plan Note (Signed)
Had a heart cath recently.  Insignificant disease.  Continue aggressive risk factor modification.

## 2012-03-05 NOTE — Assessment & Plan Note (Signed)
Low cholesterol diet and exercise.  On Pravastatin.  Follow.  Check lipid panel and liver function.

## 2012-03-05 NOTE — Assessment & Plan Note (Signed)
Has had GI w/up as outlined.  Recheck cbc/ferritin.  Taking iron daily.

## 2012-03-05 NOTE — Assessment & Plan Note (Signed)
Insignificant disease on recent cath.  Aggressive risk factor modification.

## 2012-03-14 DIAGNOSIS — K760 Fatty (change of) liver, not elsewhere classified: Secondary | ICD-10-CM

## 2012-03-14 HISTORY — DX: Fatty (change of) liver, not elsewhere classified: K76.0

## 2012-03-27 ENCOUNTER — Encounter: Payer: Self-pay | Admitting: Internal Medicine

## 2012-04-02 ENCOUNTER — Other Ambulatory Visit: Payer: Self-pay | Admitting: Internal Medicine

## 2012-04-03 ENCOUNTER — Encounter: Payer: Self-pay | Admitting: Internal Medicine

## 2012-04-03 NOTE — Telephone Encounter (Signed)
Sent in to pharmacy.  

## 2012-04-03 NOTE — Telephone Encounter (Signed)
Already sent in to pharmacy. 

## 2012-04-09 ENCOUNTER — Ambulatory Visit (INDEPENDENT_AMBULATORY_CARE_PROVIDER_SITE_OTHER): Payer: BC Managed Care – PPO | Admitting: Internal Medicine

## 2012-04-09 ENCOUNTER — Encounter: Payer: Self-pay | Admitting: Internal Medicine

## 2012-04-09 VITALS — BP 116/80 | HR 101 | Temp 98.3°F | Ht 64.0 in | Wt 236.0 lb

## 2012-04-09 DIAGNOSIS — D649 Anemia, unspecified: Secondary | ICD-10-CM

## 2012-04-09 DIAGNOSIS — E7800 Pure hypercholesterolemia, unspecified: Secondary | ICD-10-CM

## 2012-04-09 DIAGNOSIS — I10 Essential (primary) hypertension: Secondary | ICD-10-CM

## 2012-04-09 DIAGNOSIS — E119 Type 2 diabetes mellitus without complications: Secondary | ICD-10-CM

## 2012-04-09 DIAGNOSIS — I2510 Atherosclerotic heart disease of native coronary artery without angina pectoris: Secondary | ICD-10-CM

## 2012-04-10 ENCOUNTER — Encounter: Payer: Self-pay | Admitting: Internal Medicine

## 2012-04-10 NOTE — Progress Notes (Signed)
Subjective:    Patient ID: Evelyn James, female    DOB: 07-Oct-1960, 52 y.o.   MRN: [id number]  HPI 52 year old female with past history of diabetes, hypercholesterolemia, hypertension and reoccurring allergy problems who comes in today for a scheduled follow up.  She states she has been doing relatively well.  Last a1c elevated - 7.3.  Had wanted to start medication.  She declined.  She wanted to work on diet and exercise.  Is exercising 3x/week. Is building up - time.  No chest pain or tightness with increased activity or exertion.  Is watching what she eats.  States her blood sugar recently has been in the 90s.  Brought in no recorded sugar readings.  She is taking one iron per day.  Bowels stable.   She does report that over the weekend, she developed a sore throat and some headache - sinus congestion.  Started mucinex.  Doing better.  Feels better.  No chest congestion.    Past Medical History  Diagnosis Date  . Diabetes mellitus   . Hypertension   . Hypercholesterolemia   . Reflux   . TMJ dysfunction   . Anemia     Noted 11/2011  . Chest pain     Admitted 11/2011: cath with mild nonobstructive coronary plaque, normal EF, negative cardiac enzymes and negative d-dimer.    Current Outpatient Prescriptions on File Prior to Visit  Medication Sig Dispense Refill  . aspirin 81 MG EC tablet Take 1 tablet (81 mg total) by mouth daily.      Marland Kitchen BAYER CONTOUR TEST test strip CHECK BLOOD SUGAR TWICE A DAY DX 250.00  100 each  1  . cetirizine (ZYRTEC) 10 MG tablet Take 10 mg by mouth daily.      . colestipol (COLESTID) 1 G tablet Take 1 g by mouth 2 (two) times daily.      . cyclobenzaprine (FLEXERIL) 10 MG tablet 1/2 - 1 tablet q pm prn  30 tablet  0  . dicyclomine (BENTYL) 10 MG capsule Take 10 mg by mouth 4 (four) times daily -  before meals and at bedtime.      Marland Kitchen escitalopram (LEXAPRO) 10 MG tablet Take 2 tablets per day  60 tablet  5  . estrogens, conjugated, (PREMARIN) 0.45 MG tablet  Take 1 tablet (0.45 mg total) by mouth daily.  30 tablet  5  . FeFum-FePo-FA-B Cmp-C-Zn-Mn-Cu (SE-TAN PLUS) 162-115.2-1 MG CAPS Take 1 capsule by mouth daily.  30 capsule  6  . fluticasone (FLONASE) 50 MCG/ACT nasal spray Place 2 sprays into the nose daily.  16 g  5  . fluticasone (FLOVENT DISKUS) 50 MCG/BLIST diskus inhaler Inhale 1 puff into the lungs 2 (two) times daily.      Marland Kitchen lisinopril (PRINIVIL,ZESTRIL) 20 MG tablet Take 1 tablet (20 mg total) by mouth daily.  90 tablet  3  . metFORMIN (GLUCOPHAGE) 1000 MG tablet Take 1 tablet (1,000 mg total) by mouth 2 (two) times daily with a meal.  180 tablet  3  . naproxen sodium (ANAPROX) 220 MG tablet Take 220 mg by mouth daily as needed. For pain      . pantoprazole (PROTONIX) 40 MG tablet Take 1 tablet (40 mg total) by mouth daily.  90 tablet  3  . pravastatin (PRAVACHOL) 40 MG tablet Take 1 tablet (40 mg total) by mouth daily.  30 tablet  3  . sucralfate (CARAFATE) 1 G tablet Take 1 g by mouth 4 (  four) times daily.        Review of Systems Patient denies any significant headache, lightheadedness or dizziness.  Recently minimal sinus headache.  No significant headache today.  Feeling better.  No chest pain, tightness or palpitations.  No increased shortness of breath, cough or congestion.  No nausea or vomiting.  No significant acid reflux reported.  No abdominal pain or cramping.  No bowel change, such as diarrhea, constipation, BRBPR or melana.  No urine change.   Is exercising.  Has adjusted her diet.      Objective:   Physical Exam  Filed Vitals:   04/09/12 1606  BP: 116/80  Pulse: 101  Temp: 98.3 F (79.1 C)   52 year old female in no acute distress.   HEENT:  Nares- clear.  Slightly erythematous turbinates.  Oropharynx - without lesions. NECK:  Supple.  Nontender.  No audible bruit.  HEART:  Appears to be regular. LUNGS:  No crackles or wheezing audible.  Respirations even and unlabored.  RADIAL PULSE:  Equal bilaterally.     ABDOMEN:  Soft, nontender.  Bowel sounds present and normal.  No audible abdominal bruit.   EXTREMITIES:  No increased edema present.  DP pulses palpable and equal bilaterally.           Assessment & Plan:  GI.  Colonoscopy 05/04/10 negative for colitis.  Two adenomas removed.  EGD revealed gastritis.  Bx negative for sprue, Barretts and H. Pylori.  Saw Dr Katrinka Blazing.  Had abdominal ultrasound, HIDA (normal function) and MRI liver - normal.  Had hernia repair 05/18/10.   On Protonix.  Follow.  Overall doing better.   GYN.  S/p hysterectomy and BSO.  Pelvic last visit.    INCREASED PSYCHOSOCIAL STRESSORS.  Stable.    HEALTH MAINTENANCE.  Physical 02/27/12.  She is s/p hysterectomy.  GI as outlined above.  Mammogram 05/17/11 - BiRADS II.   Scheduled for a follow up mammogram.

## 2012-04-10 NOTE — Assessment & Plan Note (Signed)
Last a1c 7.3.  See above.  Declined medication changes.  Working on diet and exercises.  Per her report, sugars improved.  Follow.

## 2012-04-10 NOTE — Assessment & Plan Note (Signed)
See above.  Goal LDL 70.  Working on diet and exercise.  Will recheck lipid profile prior to next appt and adjust pravastatin if needed.

## 2012-04-10 NOTE — Assessment & Plan Note (Signed)
Recently found to have minimal disease on cath.  Aggressive risk factor modification.  Before adjusting medication, she wanted to see what she could do with diet and exercise.  Will follow.  Goal LDL 70.  Obtain better sugar control.

## 2012-04-10 NOTE — Assessment & Plan Note (Signed)
Continue iron supplements.  Last hgb stable - 11.3.  Has had extensive GI w/up as outlined.

## 2012-04-10 NOTE — Assessment & Plan Note (Signed)
Blood pressure under good control.  Same medication regimen.  Follow.   

## 2012-04-28 ENCOUNTER — Other Ambulatory Visit: Payer: Self-pay

## 2012-05-23 ENCOUNTER — Encounter: Payer: Self-pay | Admitting: Internal Medicine

## 2012-05-23 ENCOUNTER — Ambulatory Visit: Payer: Self-pay | Admitting: Internal Medicine

## 2012-06-11 ENCOUNTER — Encounter: Payer: Self-pay | Admitting: Internal Medicine

## 2012-07-04 ENCOUNTER — Other Ambulatory Visit (INDEPENDENT_AMBULATORY_CARE_PROVIDER_SITE_OTHER): Payer: BC Managed Care – PPO

## 2012-07-04 ENCOUNTER — Other Ambulatory Visit: Payer: Self-pay

## 2012-07-04 ENCOUNTER — Encounter: Payer: Self-pay | Admitting: Internal Medicine

## 2012-07-04 DIAGNOSIS — D649 Anemia, unspecified: Secondary | ICD-10-CM

## 2012-07-04 DIAGNOSIS — E119 Type 2 diabetes mellitus without complications: Secondary | ICD-10-CM

## 2012-07-04 DIAGNOSIS — E7800 Pure hypercholesterolemia, unspecified: Secondary | ICD-10-CM

## 2012-07-04 DIAGNOSIS — I10 Essential (primary) hypertension: Secondary | ICD-10-CM

## 2012-07-04 LAB — BASIC METABOLIC PANEL
BUN: 15 mg/dL (ref 6–23)
CO2: 27 mEq/L (ref 19–32)
Calcium: 9 mg/dL (ref 8.4–10.5)
Creatinine, Ser: 0.6 mg/dL (ref 0.4–1.2)

## 2012-07-04 LAB — HEPATIC FUNCTION PANEL
Albumin: 3.6 g/dL (ref 3.5–5.2)
Bilirubin, Direct: 0 mg/dL (ref 0.0–0.3)
Total Protein: 6.8 g/dL (ref 6.0–8.3)

## 2012-07-04 LAB — CBC WITH DIFFERENTIAL/PLATELET
Basophils Relative: 0.4 % (ref 0.0–3.0)
Eosinophils Absolute: 0.2 10*3/uL (ref 0.0–0.7)
Eosinophils Relative: 2.2 % (ref 0.0–5.0)
Lymphocytes Relative: 28.7 % (ref 12.0–46.0)
Neutrophils Relative %: 63.2 % (ref 43.0–77.0)
RBC: 4.25 Mil/uL (ref 3.87–5.11)
WBC: 9.5 10*3/uL (ref 4.5–10.5)

## 2012-07-04 LAB — FERRITIN: Ferritin: 10.1 ng/mL (ref 10.0–291.0)

## 2012-07-04 LAB — LIPID PANEL
HDL: 51.7 mg/dL (ref >39.00)
LDL Cholesterol: 79 mg/dL (ref 0–99)
Total CHOL/HDL Ratio: 3
Triglycerides: 167 mg/dL — ABNORMAL HIGH (ref 0.0–149.0)

## 2012-07-04 MED ORDER — PRAVASTATIN SODIUM 40 MG PO TABS: 40.0000 mg | ORAL_TABLET | Freq: Every day | ORAL | Status: DC

## 2012-07-04 NOTE — Telephone Encounter (Signed)
Pravastatin #90 x 3rf sent to [facility]

## 2012-07-11 ENCOUNTER — Encounter: Payer: Self-pay | Admitting: Internal Medicine

## 2012-07-11 ENCOUNTER — Ambulatory Visit (INDEPENDENT_AMBULATORY_CARE_PROVIDER_SITE_OTHER): Payer: BC Managed Care – PPO | Admitting: Internal Medicine

## 2012-07-11 VITALS — BP 110/80 | HR 96 | Temp 98.6°F | Ht 64.0 in | Wt 234.2 lb

## 2012-07-11 DIAGNOSIS — I10 Essential (primary) hypertension: Secondary | ICD-10-CM

## 2012-07-11 DIAGNOSIS — D649 Anemia, unspecified: Secondary | ICD-10-CM

## 2012-07-11 DIAGNOSIS — E7800 Pure hypercholesterolemia, unspecified: Secondary | ICD-10-CM

## 2012-07-11 DIAGNOSIS — E119 Type 2 diabetes mellitus without complications: Secondary | ICD-10-CM

## 2012-07-11 DIAGNOSIS — G4730 Sleep apnea, unspecified: Secondary | ICD-10-CM

## 2012-07-11 DIAGNOSIS — I2510 Atherosclerotic heart disease of native coronary artery without angina pectoris: Secondary | ICD-10-CM

## 2012-07-11 MED ORDER — GLIMEPIRIDE 2 MG PO TABS: 2.0000 mg | ORAL_TABLET | Freq: Every day | ORAL | Status: DC

## 2012-07-12 ENCOUNTER — Encounter: Payer: Self-pay | Admitting: Internal Medicine

## 2012-07-12 NOTE — Assessment & Plan Note (Signed)
Recently found to have minimal disease on cath.  Aggressive risk factor modification.  Currently asymptomatic.  Follow.   

## 2012-07-12 NOTE — Assessment & Plan Note (Signed)
Lipid profile just checked 07/09/12 and revealed total cholesterol 164, triglycerides 167, HDL 52 and LDL 79.  Continue low cholesterol diet, exercise and current med regimen.       

## 2012-07-12 NOTE — Assessment & Plan Note (Signed)
Continue CPAP.  

## 2012-07-12 NOTE — Assessment & Plan Note (Signed)
Last a1c 7.3.  Discussed the importance of diet and exercise.  Check and record sugars.  Keep up to date with eye checks.  On metformin.  Discussed other treatment options.  Prefers generic medication.  Add amaryl 2mg  q day.  Follow.  Get her back in soon to reassess.

## 2012-07-12 NOTE — Progress Notes (Signed)
Subjective:    Patient ID: Evelyn James, female    DOB: 1960/11/01, 52 y.o.   MRN: [id number]  HPI 52 year old female with past history of diabetes, hypercholesterolemia, hypertension and reoccurring allergy problems who comes in today for a scheduled follow up.  She states she has been doing relatively well.  Last a1c elevated - 7.3.  Had wanted to start medication.  She declined.  She wanted to work on diet and exercise.  She comes in today stating that she is not watching what she eats.  has started exercising - 15 minutes per day.   No chest pain or tightness with increased activity or exertion.  Brought in no recorded sugar readings.  She is taking one iron per day.  Bowels stable.     Past Medical History  Diagnosis Date  . Diabetes mellitus   . Hypertension   . Hypercholesterolemia   . Reflux   . TMJ dysfunction   . Anemia     Noted 11/2011  . Chest pain     Admitted 11/2011: cath with mild nonobstructive coronary plaque, normal EF, negative cardiac enzymes and negative d-dimer.    Current Outpatient Prescriptions on File Prior to Visit  Medication Sig Dispense Refill  . aspirin 81 MG EC tablet Take 1 tablet (81 mg total) by mouth daily.      Marland Kitchen BAYER CONTOUR TEST test strip CHECK BLOOD SUGAR TWICE A DAY DX 250.00  100 each  1  . cetirizine (ZYRTEC) 10 MG tablet Take 10 mg by mouth daily.      . colestipol (COLESTID) 1 G tablet Take 1 g by mouth 2 (two) times daily.      . cyclobenzaprine (FLEXERIL) 10 MG tablet 1/2 - 1 tablet q pm prn  30 tablet  0  . dicyclomine (BENTYL) 10 MG capsule Take 10 mg by mouth 4 (four) times daily -  before meals and at bedtime.      Marland Kitchen escitalopram (LEXAPRO) 10 MG tablet Take 2 tablets per day  60 tablet  5  . estrogens, conjugated, (PREMARIN) 0.45 MG tablet Take 1 tablet (0.45 mg total) by mouth daily.  30 tablet  5  . FeFum-FePo-FA-B Cmp-C-Zn-Mn-Cu (SE-TAN PLUS) 162-115.2-1 MG CAPS Take 1 capsule by mouth daily.  30 capsule  6  . fluticasone  (FLONASE) 50 MCG/ACT nasal spray Place 2 sprays into the nose daily.  16 g  5  . lisinopril (PRINIVIL,ZESTRIL) 20 MG tablet Take 1 tablet (20 mg total) by mouth daily.  90 tablet  3  . metFORMIN (GLUCOPHAGE) 1000 MG tablet Take 1 tablet (1,000 mg total) by mouth 2 (two) times daily with a meal.  180 tablet  3  . naproxen sodium (ANAPROX) 220 MG tablet Take 220 mg by mouth daily as needed. For pain      . pantoprazole (PROTONIX) 40 MG tablet Take 1 tablet (40 mg total) by mouth daily.  90 tablet  3  . pravastatin (PRAVACHOL) 40 MG tablet Take 1 tablet (40 mg total) by mouth daily.  90 tablet  3  . sucralfate (CARAFATE) 1 G tablet Take 1 g by mouth 4 (four) times daily.       No current facility-administered medications on file prior to visit.    Review of Systems Patient denies any significant headache, lightheadedness or dizziness.  Some minimal sinus pressure and nasal congestion.  Discussed using saline and Flonase.  No chest pain, tightness or palpitations.  No increased shortness of  breath, cough or congestion.  No nausea or vomiting.  No significant acid reflux reported.  No abdominal pain or cramping.  No bowel change, such as diarrhea, constipation, BRBPR or melana.  No urine change.  Started exercising.  Not watching what she eats.  Plans to get more serious about her diet.  Feels she is handling stress well.      Objective:   Physical Exam  Filed Vitals:   07/11/12 0955  BP: 110/80  Pulse: 96  Temp: 98.6 F (37 C)   Blood pressure recheck:  128/82, pulse 76  52 year old female in no acute distress.   HEENT:  Nares- clear.  Slightly erythematous turbinates.  Oropharynx - without lesions. NECK:  Supple.  Nontender.  No audible bruit.  HEART:  Appears to be regular. LUNGS:  No crackles or wheezing audible.  Respirations even and unlabored.  RADIAL PULSE:  Equal bilaterally.   ABDOMEN:  Soft, nontender.  Bowel sounds present and normal.  No audible abdominal bruit.    EXTREMITIES:  No increased edema present.  DP pulses palpable and equal bilaterally.           Assessment & Plan:  GI.  Colonoscopy 05/04/10 negative for colitis.  Two adenomas removed.  EGD revealed gastritis.  Bx negative for sprue, Barretts and H. Pylori.  Saw Dr Katrinka Blazing.  Had abdominal ultrasound, HIDA (normal function) and MRI liver - normal.  Had hernia repair 05/18/10.   On Protonix.  Follow.  Overall doing better.   GYN.  S/p hysterectomy and BSO.    INCREASED PSYCHOSOCIAL STRESSORS.  Stable.    HEALTH MAINTENANCE.  Physical 02/27/12.  She is s/p hysterectomy.  GI as outlined above.  Mammogram 05/23/12 - BiRADS II.

## 2012-07-12 NOTE — Assessment & Plan Note (Signed)
Continue iron supplements.  Last hgb stable - 11.3.  Has had extensive GI w/up as outlined.

## 2012-07-12 NOTE — Assessment & Plan Note (Signed)
Blood pressure under good control.  Same medication regimen.  Follow.   

## 2012-08-24 ENCOUNTER — Encounter: Payer: Self-pay | Admitting: Internal Medicine

## 2012-08-24 ENCOUNTER — Ambulatory Visit (INDEPENDENT_AMBULATORY_CARE_PROVIDER_SITE_OTHER): Payer: BC Managed Care – PPO | Admitting: Internal Medicine

## 2012-08-24 VITALS — BP 100/70 | HR 82 | Temp 98.2°F | Ht 64.0 in | Wt 237.5 lb

## 2012-08-24 DIAGNOSIS — I10 Essential (primary) hypertension: Secondary | ICD-10-CM

## 2012-08-24 DIAGNOSIS — E119 Type 2 diabetes mellitus without complications: Secondary | ICD-10-CM

## 2012-08-24 DIAGNOSIS — I2510 Atherosclerotic heart disease of native coronary artery without angina pectoris: Secondary | ICD-10-CM

## 2012-08-24 DIAGNOSIS — D649 Anemia, unspecified: Secondary | ICD-10-CM

## 2012-08-24 DIAGNOSIS — E7800 Pure hypercholesterolemia, unspecified: Secondary | ICD-10-CM

## 2012-08-24 DIAGNOSIS — G4730 Sleep apnea, unspecified: Secondary | ICD-10-CM

## 2012-08-24 LAB — HM DIABETES FOOT EXAM

## 2012-08-26 ENCOUNTER — Encounter: Payer: Self-pay | Admitting: Internal Medicine

## 2012-08-26 NOTE — Assessment & Plan Note (Signed)
Continue CPAP.  

## 2012-08-26 NOTE — Assessment & Plan Note (Signed)
Blood pressure under good control.  Same medication regimen.  Follow.   

## 2012-08-26 NOTE — Assessment & Plan Note (Signed)
Recently found to have minimal disease on cath.  Aggressive risk factor modification.  Currently asymptomatic.  Follow.   

## 2012-08-26 NOTE — Progress Notes (Signed)
Subjective:    Patient ID: Evelyn James, female    DOB: 1960-04-01, 52 y.o.   MRN: [id number]  HPI 52 year old female with past history of diabetes, hypercholesterolemia, hypertension and reoccurring allergy problems who comes in today for a scheduled follow up.  She states she has been doing relatively well.  Last a1c elevated - 7.3.  Added amaryl to her med regimen.  Her sugars are doing better.  She has adjusted her diet.  AM sugars averaging - 100-120 and PM sugars averaging 100-130s recently.  See attached list for details.  She has had some increased stress recently with her father's medical issues.  He has been diagnosed with mild dementia.  Overall she is handling things relatively well.  No acid reflux.  Bowels stable.  Overall she feels she is doing relatively well.      Past Medical History  Diagnosis Date  . Diabetes mellitus   . Hypertension   . Hypercholesterolemia   . Reflux   . TMJ dysfunction   . Anemia     Noted 11/2011  . Chest pain     Admitted 11/2011: cath with mild nonobstructive coronary plaque, normal EF, negative cardiac enzymes and negative d-dimer.    Current Outpatient Prescriptions on File Prior to Visit  Medication Sig Dispense Refill  . BAYER CONTOUR TEST test strip CHECK BLOOD SUGAR TWICE A DAY DX 250.00  100 each  1  . cetirizine (ZYRTEC) 10 MG tablet Take 10 mg by mouth daily.      . colestipol (COLESTID) 1 G tablet Take 1 g by mouth 2 (two) times daily.      . cyclobenzaprine (FLEXERIL) 10 MG tablet 1/2 - 1 tablet q pm prn  30 tablet  0  . dicyclomine (BENTYL) 10 MG capsule Take 10 mg by mouth 4 (four) times daily -  before meals and at bedtime.      Marland Kitchen escitalopram (LEXAPRO) 10 MG tablet Take 2 tablets per day  60 tablet  5  . estrogens, conjugated, (PREMARIN) 0.45 MG tablet Take 1 tablet (0.45 mg total) by mouth daily.  30 tablet  5  . FeFum-FePo-FA-B Cmp-C-Zn-Mn-Cu (SE-TAN PLUS) 162-115.2-1 MG CAPS Take 1 capsule by mouth daily.  30 capsule  6  .  fluticasone (FLONASE) 50 MCG/ACT nasal spray Place 2 sprays into the nose daily.  16 g  5  . glimepiride (AMARYL) 2 MG tablet Take 1 tablet (2 mg total) by mouth daily before breakfast.  30 tablet  2  . lisinopril (PRINIVIL,ZESTRIL) 20 MG tablet Take 1 tablet (20 mg total) by mouth daily.  90 tablet  3  . metFORMIN (GLUCOPHAGE) 1000 MG tablet Take 1 tablet (1,000 mg total) by mouth 2 (two) times daily with a meal.  180 tablet  3  . naproxen sodium (ANAPROX) 220 MG tablet Take 220 mg by mouth daily as needed. For pain      . pantoprazole (PROTONIX) 40 MG tablet Take 1 tablet (40 mg total) by mouth daily.  90 tablet  3  . pravastatin (PRAVACHOL) 40 MG tablet Take 1 tablet (40 mg total) by mouth daily.  90 tablet  3  . sucralfate (CARAFATE) 1 G tablet Take 1 g by mouth 4 (four) times daily.      Marland Kitchen aspirin 81 MG EC tablet Take 1 tablet (81 mg total) by mouth daily.       No current facility-administered medications on file prior to visit.    Review of  Systems Patient denies any significant headache, lightheadedness or dizziness.  No significant sinus or allergy symptoms.  No chest pain, tightness or palpitations.  No increased shortness of breath, cough or congestion.  No nausea or vomiting.  No significant acid reflux reported.  No abdominal pain or cramping.  No bowel change, such as diarrhea, constipation, BRBPR or melana.  No urine change.  Started exercising.  Trying to watch what she eats.  Sugars improving.   Feels she is handling stress well.      Objective:   Physical Exam  Filed Vitals:   08/24/12 0816  BP: 100/70  Pulse: 82  Temp: 98.2 F (36.8 C)   Blood pressure recheck:  128/82, pulse 61  52 year old female in no acute distress.   HEENT:  Nares- clear.  Oropharynx - without lesions. NECK:  Supple.  Nontender.  No audible bruit.  HEART:  Appears to be regular. LUNGS:  No crackles or wheezing audible.  Respirations even and unlabored.  RADIAL PULSE:  Equal bilaterally.    ABDOMEN:  Soft, nontender.  Bowel sounds present and normal.  No audible abdominal bruit.   EXTREMITIES:  No increased edema present.  DP pulses palpable and equal bilaterally.           Assessment & Plan:  GI.  Colonoscopy 05/04/10 negative for colitis.  Two adenomas removed.  EGD revealed gastritis.  Bx negative for sprue, Barretts and H. Pylori.  Saw Dr Katrinka Blazing.  Had abdominal ultrasound, HIDA (normal function) and MRI liver - normal.  Had hernia repair 05/18/10.   On Protonix.  Follow.  Overall doing better. Feels stable.      GYN.  S/p hysterectomy and BSO.    INCREASED PSYCHOSOCIAL STRESSORS.  Stable.    HEALTH MAINTENANCE.  Physical 02/27/12.  She is s/p hysterectomy.  GI as outlined above.  Mammogram 05/23/12 - BiRADS II.

## 2012-08-26 NOTE — Assessment & Plan Note (Signed)
Last a1c 7.3.  Discussed the importance of diet and exercise.  Keep up to date with eye checks.  On metformin and amaryl.  Sugars as outlined.  Improving.  Follow.

## 2012-08-26 NOTE — Assessment & Plan Note (Signed)
Lipid profile just checked 07/09/12 and revealed total cholesterol 164, triglycerides 167, HDL 52 and LDL 79.  Continue low cholesterol diet, exercise and current med regimen.

## 2012-08-26 NOTE — Assessment & Plan Note (Signed)
Continue iron supplements.  Last hgb stable - 11.4.  Has had extensive GI w/up as outlined.

## 2012-08-28 ENCOUNTER — Telehealth: Payer: Self-pay | Admitting: *Deleted

## 2012-08-28 NOTE — Telephone Encounter (Signed)
Called [facility] for the Pantoprazole 40 mg, form was place in Dr. Alverda Skeans

## 2012-09-05 ENCOUNTER — Telehealth: Payer: Self-pay | Admitting: *Deleted

## 2012-09-05 NOTE — Telephone Encounter (Signed)
Faxed over PA request form for the Pantoprazole SOD DR 40 mg tab to [phone]

## 2012-09-08 ENCOUNTER — Encounter: Payer: Self-pay | Admitting: Internal Medicine

## 2012-09-11 ENCOUNTER — Other Ambulatory Visit: Payer: Self-pay | Admitting: Internal Medicine

## 2012-09-13 ENCOUNTER — Telehealth: Payer: Self-pay | Admitting: *Deleted

## 2012-09-13 NOTE — Telephone Encounter (Signed)
Received a Fax from [facility] Pantoprazole was been APPROVED   06.12.2014-07.03.2015

## 2012-09-13 NOTE — Telephone Encounter (Signed)
Faxed over [place] request form for pantoprazole to [phone]

## 2012-09-27 ENCOUNTER — Telehealth: Payer: Self-pay | Admitting: *Deleted

## 2012-09-27 NOTE — Telephone Encounter (Signed)
Called [facility] for the Prior Authorization on the Pantoprazole, received PA request form, place PA request form in Dr.Scotts folder

## 2012-09-28 NOTE — Telephone Encounter (Signed)
Faxed over PA request form to [phone]  

## 2012-10-03 ENCOUNTER — Other Ambulatory Visit: Payer: Self-pay | Admitting: Internal Medicine

## 2012-10-17 ENCOUNTER — Encounter: Payer: Self-pay | Admitting: *Deleted

## 2012-10-17 ENCOUNTER — Encounter: Payer: Self-pay | Admitting: Internal Medicine

## 2012-10-31 ENCOUNTER — Other Ambulatory Visit: Payer: Self-pay | Admitting: Internal Medicine

## 2012-10-31 ENCOUNTER — Other Ambulatory Visit (INDEPENDENT_AMBULATORY_CARE_PROVIDER_SITE_OTHER): Payer: BC Managed Care – PPO

## 2012-10-31 DIAGNOSIS — E7800 Pure hypercholesterolemia, unspecified: Secondary | ICD-10-CM

## 2012-10-31 DIAGNOSIS — D649 Anemia, unspecified: Secondary | ICD-10-CM

## 2012-10-31 DIAGNOSIS — E119 Type 2 diabetes mellitus without complications: Secondary | ICD-10-CM

## 2012-10-31 DIAGNOSIS — I10 Essential (primary) hypertension: Secondary | ICD-10-CM

## 2012-10-31 LAB — BASIC METABOLIC PANEL
Calcium: 9.1 mg/dL (ref 8.4–10.5)
Chloride: 100 mEq/L (ref 96–112)
Creatinine, Ser: 0.6 mg/dL (ref 0.4–1.2)

## 2012-10-31 LAB — LIPID PANEL
LDL Cholesterol: 73 mg/dL (ref 0–99)
Total CHOL/HDL Ratio: 3
Triglycerides: 152 mg/dL — ABNORMAL HIGH (ref 0.0–149.0)

## 2012-10-31 LAB — CBC WITH DIFFERENTIAL/PLATELET
Basophils Relative: 0.6 % (ref 0.0–3.0)
Eosinophils Absolute: 0.2 10*3/uL (ref 0.0–0.7)
Eosinophils Relative: 2 % (ref 0.0–5.0)
Lymphocytes Relative: 28.7 % (ref 12.0–46.0)
MCHC: 32.6 g/dL (ref 30.0–36.0)
Monocytes Relative: 6.6 % (ref 3.0–12.0)
Neutrophils Relative %: 62.1 % (ref 43.0–77.0)
RBC: 3.89 Mil/uL (ref 3.87–5.11)
WBC: 8.5 10*3/uL (ref 4.5–10.5)

## 2012-10-31 LAB — HEPATIC FUNCTION PANEL
ALT: 17 U/L (ref 0–35)
Bilirubin, Direct: 0.1 mg/dL (ref 0.0–0.3)
Total Bilirubin: 0.3 mg/dL (ref 0.3–1.2)

## 2012-10-31 LAB — HEMOGLOBIN A1C: Hgb A1c MFr Bld: 6.6 % — ABNORMAL HIGH (ref 4.6–6.5)

## 2012-11-04 ENCOUNTER — Encounter: Payer: Self-pay | Admitting: Internal Medicine

## 2012-11-05 ENCOUNTER — Encounter: Payer: Self-pay | Admitting: Internal Medicine

## 2012-11-05 ENCOUNTER — Ambulatory Visit (INDEPENDENT_AMBULATORY_CARE_PROVIDER_SITE_OTHER): Payer: BC Managed Care – PPO | Admitting: Internal Medicine

## 2012-11-05 VITALS — BP 104/70 | HR 84 | Temp 98.6°F | Wt 238.5 lb

## 2012-11-05 DIAGNOSIS — D649 Anemia, unspecified: Secondary | ICD-10-CM

## 2012-11-05 DIAGNOSIS — E119 Type 2 diabetes mellitus without complications: Secondary | ICD-10-CM

## 2012-11-05 DIAGNOSIS — E7800 Pure hypercholesterolemia, unspecified: Secondary | ICD-10-CM

## 2012-11-05 DIAGNOSIS — I10 Essential (primary) hypertension: Secondary | ICD-10-CM

## 2012-11-05 DIAGNOSIS — G4730 Sleep apnea, unspecified: Secondary | ICD-10-CM

## 2012-11-05 DIAGNOSIS — Z23 Encounter for immunization: Secondary | ICD-10-CM

## 2012-11-05 DIAGNOSIS — I2510 Atherosclerotic heart disease of native coronary artery without angina pectoris: Secondary | ICD-10-CM

## 2012-11-05 MED ORDER — CEPHALEXIN 500 MG PO CAPS: 500.0000 mg | ORAL_CAPSULE | Freq: Three times a day (TID) | ORAL | Status: DC

## 2012-11-05 NOTE — Progress Notes (Signed)
Subjective:    Patient ID: Evelyn James, female    DOB: [birth year], 52 y.o.   MRN: [id number]  HPI 52 year old female with past history of diabetes, hypercholesterolemia, hypertension and reoccurring allergy problems who comes in today for a scheduled follow up.  She states she has been doing relatively well.  Last a1c just checked.  Improved - 6.6.  Previously added amaryl to her med regimen.  Her sugars are doing better.  She has adjusted her diet.  Brought in no recorded sugar readings.  She is exercising now.  Goes to the gym with her father.   No acid reflux.  Bowels stable.  Overall she feels she is doing relatively well.   Not taking her iron regularly.  She does report noticing a vaginal cyst.  Present for one week.  Is draining.     Past Medical History  Diagnosis Date  . Diabetes mellitus   . Hypertension   . Hypercholesterolemia   . Reflux   . TMJ dysfunction   . Anemia     Noted 11/2011  . Chest pain     Admitted 11/2011: cath with mild nonobstructive coronary plaque, normal EF, negative cardiac enzymes and negative d-dimer.    Current Outpatient Prescriptions on File Prior to Visit  Medication Sig Dispense Refill  . BAYER CONTOUR TEST test strip CHECK BLOOD SUGAR TWICE A DAY  100 each  3  . cetirizine (ZYRTEC) 10 MG tablet Take 10 mg by mouth daily.      . colestipol (COLESTID) 1 G tablet Take 1 g by mouth 2 (two) times daily.      . cyclobenzaprine (FLEXERIL) 10 MG tablet 1/2 - 1 tablet q pm prn  30 tablet  0  . dicyclomine (BENTYL) 10 MG capsule Take 10 mg by mouth 4 (four) times daily -  before meals and at bedtime.      Marland Kitchen escitalopram (LEXAPRO) 10 MG tablet Take 2 tablets per day  60 tablet  5  . FeFum-FePo-FA-B Cmp-C-Zn-Mn-Cu (SE-TAN PLUS) 162-115.2-1 MG CAPS Take 1 capsule by mouth daily.  30 capsule  6  . fluticasone (FLONASE) 50 MCG/ACT nasal spray Place 2 sprays into the nose daily.  16 g  5  . glimepiride (AMARYL) 2 MG tablet TAKE 1 TABLET (2 MG TOTAL) BY MOUTH  DAILY BEFORE BREAKFAST.  30 tablet  5  . lisinopril (PRINIVIL,ZESTRIL) 20 MG tablet Take 1 tablet (20 mg total) by mouth daily.  90 tablet  3  . metFORMIN (GLUCOPHAGE) 1000 MG tablet Take 1 tablet (1,000 mg total) by mouth 2 (two) times daily with a meal.  180 tablet  3  . naproxen sodium (ANAPROX) 220 MG tablet Take 220 mg by mouth daily as needed. For pain      . pantoprazole (PROTONIX) 40 MG tablet Take 1 tablet (40 mg total) by mouth daily.  90 tablet  3  . pravastatin (PRAVACHOL) 40 MG tablet Take 1 tablet (40 mg total) by mouth daily.  90 tablet  3  . PREMARIN 0.45 MG tablet TAKE 1 TABLET BY MOUTH EVERY DAY  30 tablet  5  . sucralfate (CARAFATE) 1 G tablet Take 1 g by mouth 4 (four) times daily.       No current facility-administered medications on file prior to visit.    Review of Systems Patient denies any headache, lightheadedness or dizziness.  No significant sinus or allergy symptoms.  No chest pain, tightness or palpitations.  No increased shortness  of breath, cough or congestion.  No nausea or vomiting.  No significant acid reflux reported.  No abdominal pain or cramping.  No bowel change, such as diarrhea, constipation, BRBPR or melana.  No urine change.  Started exercising.  Trying to watch what she eats.  Sugars improving.   Feels she is handling stress well.  Vaginal cyst as outlined.       Objective:   Physical Exam  Filed Vitals:   11/05/12 0820  BP: 104/70  Pulse: 84  Temp: 98.6 F (70 C)   52 year old female in no acute distress.   HEENT:  Nares- clear.  Oropharynx - without lesions. NECK:  Supple.  Nontender.  No audible bruit.  HEART:  Appears to be regular. LUNGS:  No crackles or wheezing audible.  Respirations even and unlabored.  RADIAL PULSE:  Equal bilaterally.   ABDOMEN:  Soft, nontender.  Bowel sounds present and normal.  No audible abdominal bruit.  GU:  Vaginal cyst - labia (approximately 1/2 x 1 cm cyst).  Draining.  Able to express some pus from the  cyst.  Minimal tenderness.      EXTREMITIES:  No increased edema present.  DP pulses palpable and equal bilaterally.    FEET:  No lesi        Assessment & Plan:  GI.  Colonoscopy 05/04/10 negative for colitis.  Two adenomas removed.  EGD revealed gastritis.  Bx negative for sprue, Barretts and H. Pylori.  Saw Dr Katrinka Blazing.  Had abdominal ultrasound, HIDA (normal function) and MRI liver - normal.  Had hernia repair 05/18/10.   On Protonix.  Follow.  Overall doing better. Feels stable.      GYN.  S/p hysterectomy and BSO.  Has the vaginal cyst.  Is draining.  Will have her apply warm compresses and gave her keflex 500mg  tid x 1 week.  Follow.  Notify me if persistent.    INCREASED PSYCHOSOCIAL STRESSORS.  Stable.    HEALTH MAINTENANCE.  Physical 02/27/12.  She is s/p hysterectomy.  GI as outlined above.  Mammogram 05/23/12 - BiRADS II.

## 2012-11-05 NOTE — Assessment & Plan Note (Addendum)
Continue iron supplements. Has not been taking regularly.   Last hgb - 10.3.  Has had extensive GI w/up as outlined.  Start iron daily.  Recheck cbc/ferritin 4-6 weeks.

## 2012-11-07 ENCOUNTER — Encounter: Payer: Self-pay | Admitting: Internal Medicine

## 2012-11-07 ENCOUNTER — Other Ambulatory Visit: Payer: Self-pay | Admitting: *Deleted

## 2012-11-07 NOTE — Assessment & Plan Note (Signed)
Lipid profile just checked and revealed total cholesterol 164, triglycerides 153, HDL 52 and LDL 79.  Continue low cholesterol diet, exercise and current med regimen.

## 2012-11-07 NOTE — Assessment & Plan Note (Signed)
Continue CPAP.  

## 2012-11-07 NOTE — Assessment & Plan Note (Signed)
Blood pressure under good control.  Same medication regimen.  Follow.   

## 2012-11-08 ENCOUNTER — Encounter: Payer: Self-pay | Admitting: Internal Medicine

## 2012-11-08 MED ORDER — SE-TAN PLUS 162-115.2-1 MG PO CAPS: 1.0000 | ORAL_CAPSULE | Freq: Every day | ORAL | Status: DC

## 2012-11-08 NOTE — Assessment & Plan Note (Signed)
A1c improved.  Exercising now.  Continue diet, exercise and monitoring sugars.  Same medication regimen.  Follow.

## 2012-11-08 NOTE — Assessment & Plan Note (Signed)
Recently found to have minimal disease on cath.  Aggressive risk factor modification.  Currently asymptomatic.  Follow.   

## 2012-11-26 ENCOUNTER — Encounter: Payer: Self-pay | Admitting: Internal Medicine

## 2012-11-27 ENCOUNTER — Other Ambulatory Visit: Payer: Self-pay | Admitting: Internal Medicine

## 2012-12-03 ENCOUNTER — Other Ambulatory Visit: Payer: BC Managed Care – PPO

## 2012-12-06 ENCOUNTER — Ambulatory Visit (INDEPENDENT_AMBULATORY_CARE_PROVIDER_SITE_OTHER): Payer: BC Managed Care – PPO | Admitting: *Deleted

## 2012-12-06 ENCOUNTER — Other Ambulatory Visit (INDEPENDENT_AMBULATORY_CARE_PROVIDER_SITE_OTHER): Payer: BC Managed Care – PPO

## 2012-12-06 DIAGNOSIS — Z23 Encounter for immunization: Secondary | ICD-10-CM

## 2012-12-06 DIAGNOSIS — D649 Anemia, unspecified: Secondary | ICD-10-CM

## 2012-12-06 LAB — CBC WITH DIFFERENTIAL/PLATELET
Basophils Absolute: 0 10*3/uL (ref 0.0–0.1)
Eosinophils Absolute: 0.2 10*3/uL (ref 0.0–0.7)
HCT: 33.1 % — ABNORMAL LOW (ref 36.0–46.0)
Hemoglobin: 10.7 g/dL — ABNORMAL LOW (ref 12.0–15.0)
Lymphs Abs: 2.6 10*3/uL (ref 0.7–4.0)
MCHC: 32.3 g/dL (ref 30.0–36.0)
Monocytes Relative: 5.6 % (ref 3.0–12.0)
Neutro Abs: 6.6 10*3/uL (ref 1.4–7.7)
RDW: 15.1 % — ABNORMAL HIGH (ref 11.5–14.6)

## 2012-12-07 ENCOUNTER — Encounter: Payer: Self-pay | Admitting: Internal Medicine

## 2012-12-07 ENCOUNTER — Telehealth: Payer: Self-pay | Admitting: Internal Medicine

## 2012-12-07 DIAGNOSIS — D649 Anemia, unspecified: Secondary | ICD-10-CM

## 2012-12-07 MED ORDER — INTEGRA 62.5-62.5-40-3 MG PO CAPS: ORAL_CAPSULE | ORAL | Status: DC

## 2012-12-07 NOTE — Telephone Encounter (Signed)
Sent in rx for integra.  See pts my chart message.

## 2012-12-07 NOTE — Telephone Encounter (Signed)
Pt notified of lab results via my chart.  She was notified that she needed f/u labs in a month.  Please schedule her for non fasting labs in one month and contact her with the appt date and time.   Thanks.

## 2013-01-11 ENCOUNTER — Other Ambulatory Visit (INDEPENDENT_AMBULATORY_CARE_PROVIDER_SITE_OTHER): Payer: BC Managed Care – PPO

## 2013-01-11 DIAGNOSIS — D649 Anemia, unspecified: Secondary | ICD-10-CM

## 2013-01-11 LAB — CBC WITH DIFFERENTIAL/PLATELET
Basophils Absolute: 0 10*3/uL (ref 0.0–0.1)
Basophils Relative: 0.3 % (ref 0.0–3.0)
Eosinophils Absolute: 0.2 10*3/uL (ref 0.0–0.7)
Hemoglobin: 10.9 g/dL — ABNORMAL LOW (ref 12.0–15.0)
Lymphocytes Relative: 24.9 % (ref 12.0–46.0)
MCHC: 33 g/dL (ref 30.0–36.0)
Monocytes Absolute: 0.5 10*3/uL (ref 0.1–1.0)
Neutrophils Relative %: 67.3 % (ref 43.0–77.0)
RBC: 4.11 Mil/uL (ref 3.87–5.11)

## 2013-01-11 LAB — FERRITIN: Ferritin: 13.2 ng/mL (ref 10.0–291.0)

## 2013-01-13 ENCOUNTER — Encounter: Payer: Self-pay | Admitting: Internal Medicine

## 2013-01-14 ENCOUNTER — Encounter: Payer: Self-pay | Admitting: Internal Medicine

## 2013-01-14 NOTE — Telephone Encounter (Signed)
Responded to my chart message.  Will follow hgb.

## 2013-02-25 ENCOUNTER — Encounter: Payer: Self-pay | Admitting: Internal Medicine

## 2013-02-25 ENCOUNTER — Ambulatory Visit (INDEPENDENT_AMBULATORY_CARE_PROVIDER_SITE_OTHER): Payer: BC Managed Care – PPO | Admitting: Internal Medicine

## 2013-02-25 VITALS — BP 118/80 | HR 88 | Temp 98.1°F | Ht 64.0 in | Wt 240.5 lb

## 2013-02-25 DIAGNOSIS — D649 Anemia, unspecified: Secondary | ICD-10-CM

## 2013-02-25 DIAGNOSIS — I2510 Atherosclerotic heart disease of native coronary artery without angina pectoris: Secondary | ICD-10-CM

## 2013-02-25 DIAGNOSIS — I10 Essential (primary) hypertension: Secondary | ICD-10-CM

## 2013-02-25 DIAGNOSIS — E7800 Pure hypercholesterolemia, unspecified: Secondary | ICD-10-CM

## 2013-02-25 DIAGNOSIS — Z9109 Other allergy status, other than to drugs and biological substances: Secondary | ICD-10-CM

## 2013-02-25 DIAGNOSIS — E119 Type 2 diabetes mellitus without complications: Secondary | ICD-10-CM

## 2013-02-25 DIAGNOSIS — R52 Pain, unspecified: Secondary | ICD-10-CM

## 2013-02-25 DIAGNOSIS — G4730 Sleep apnea, unspecified: Secondary | ICD-10-CM

## 2013-02-25 NOTE — Assessment & Plan Note (Addendum)
Lipid profile last checked and revealed total cholesterol 164, triglycerides 153, HDL 52 and LDL 79.  Continue low cholesterol diet, exercise and current med regimen.

## 2013-02-25 NOTE — Progress Notes (Signed)
Pre-visit discussion using our clinic review tool. No additional management support is needed unless otherwise documented below in the visit note.  

## 2013-02-25 NOTE — Assessment & Plan Note (Addendum)
Blood pressure under good control.  Same medication regimen.  Follow.   

## 2013-02-25 NOTE — Assessment & Plan Note (Addendum)
Last a1c improved.  Discussed the importance of diet and exercise.  Keep up to date with eye checks.  On metformin and amaryl.  Sugars as outlined.  Will decrease amaryl to 2mg  (1/2 tablet per day).  Follow.

## 2013-02-25 NOTE — Progress Notes (Signed)
Subjective:    Patient ID: Evelyn James, female    DOB: [birth year], 52 y.o.   MRN: [id number]  HPI 52 year old female with past history of diabetes, hypercholesterolemia, hypertension and reoccurring allergy problems who comes in today to follow up on these issues as well as for a complete physical exam.  She states she has been doing relatively well.  Last a1c improved - 6.6.  Previously added amaryl to her med regimen.  Her sugars are doing better. States in the am sugars averaging 90-120 and pm 130s.  She has adjusted her diet.  Brought in no recorded sugar readings.  Sometimes has to adjust her eating in the am to avoid low blood sugar.  No significant problems with lows.  She is exercising now.  Goes to the gym with her father.   Bowels stable. She does report some nausea in the evening.  Taking protonix in the am.  Some discomfort - right side - since Thanksgiving.   Some tenderness to palpation.  Does not worsen with movement.   Overall she feels she is doing relatively well.  Does have some sinus drainage.  flonase and zyrtec work well for her.     Past Medical History  Diagnosis Date  . Diabetes mellitus   . Hypertension   . Hypercholesterolemia   . Reflux   . TMJ dysfunction   . Anemia     Noted 11/2011  . Chest pain     Admitted 11/2011: cath with mild nonobstructive coronary plaque, normal EF, negative cardiac enzymes and negative d-dimer.    Current Outpatient Prescriptions on File Prior to Visit  Medication Sig Dispense Refill  . BAYER CONTOUR TEST test strip CHECK BLOOD SUGAR TWICE A DAY  100 each  3  . cetirizine (ZYRTEC) 10 MG tablet Take 10 mg by mouth daily.      . cyclobenzaprine (FLEXERIL) 10 MG tablet 1/2 - 1 tablet q pm prn  30 tablet  0  . escitalopram (LEXAPRO) 10 MG tablet TAKE 2 TABLETS BY MOUTH EVERY DAY  60 tablet  2  . Fe Fum-FePoly-Vit C-Vit B3 (INTEGRA) 62.5-62.5-40-3 MG CAPS Take one capsule bid  60 capsule  3  . fluticasone (FLONASE) 50 MCG/ACT nasal  spray Place 2 sprays into the nose daily.  16 g  5  . glimepiride (AMARYL) 2 MG tablet TAKE 1 TABLET (2 MG TOTAL) BY MOUTH DAILY BEFORE BREAKFAST.  30 tablet  5  . lisinopril (PRINIVIL,ZESTRIL) 20 MG tablet Take 1 tablet (20 mg total) by mouth daily.  90 tablet  3  . metFORMIN (GLUCOPHAGE) 1000 MG tablet Take 1 tablet (1,000 mg total) by mouth 2 (two) times daily with a meal.  180 tablet  3  . naproxen sodium (ANAPROX) 220 MG tablet Take 220 mg by mouth daily as needed. For pain      . pantoprazole (PROTONIX) 40 MG tablet Take 1 tablet (40 mg total) by mouth daily.  90 tablet  3  . pravastatin (PRAVACHOL) 40 MG tablet Take 1 tablet (40 mg total) by mouth daily.  90 tablet  3  . PREMARIN 0.45 MG tablet TAKE 1 TABLET BY MOUTH EVERY DAY  30 tablet  5   No current facility-administered medications on file prior to visit.    Review of Systems Patient denies any headache, lightheadedness or dizziness.  Some sinus drainage.   No chest pain, tightness or palpitations.  No increased shortness of breath, cough or congestion.  No vomiting.  Some pm nausea.  No significant acid reflux reported.  No abdominal pain or cramping.  No bowel change, such as diarrhea, constipation, BRBPR or melana.  No urine change.  Started exercising.  Trying to watch what she eats.  Sugars as outlined.    Feels she is handling stress well.  Right side pain as outlined.        Objective:   Physical Exam  Filed Vitals:   02/25/13 1543  BP: 118/80  Pulse: 88  Temp: 98.1 F (48.75 C)   52 year old female in no acute distress.   HEENT:  Nares- clear.  Oropharynx - without lesions. NECK:  Supple.  Nontender.  No audible bruit.  HEART:  Appears to be regular. LUNGS:  No crackles or wheezing audible.  Respirations even and unlabored.  RADIAL PULSE:  Equal bilaterally.    BREASTS:  No nipple discharge or nipple retraction present.  Could not appreciate any distinct nodules or axillary adenopathy.  ABDOMEN:  Soft, nontender.   Bowel sounds present and normal.  No audible abdominal bruit.  GU:  Not performed.     EXTREMITIES:  No increased edema present.  DP pulses palpable and equal bilaterally.   FEET:  Without lesions.          Assessment & Plan:  GI.  Colonoscopy 05/04/10 negative for colitis.  Two adenomas removed.  EGD revealed gastritis.  Bx negative for sprue, Barretts and H. Pylori.  Saw Dr Katrinka Blazing.  Had abdominal ultrasound, HIDA (normal function) and MRI liver - normal.  Had hernia repair 05/18/10.   On Protonix.  Some pm nausea.  Add zantac before her evening meal.        GYN.  S/p hysterectomy and BSO.  Currently asymptomatic.    INCREASED PSYCHOSOCIAL STRESSORS.  Stable.    HEALTH MAINTENANCE.  Physical today.  She is s/p hysterectomy.  GI as outlined above.  Mammogram 05/23/12 - BiRADS II.

## 2013-02-25 NOTE — Assessment & Plan Note (Addendum)
Continue iron supplements. Has not been taking regularly.   Last hgb - 10.9.  Has had extensive GI w/up as outlined.   Recheck cbc/ferritin.

## 2013-02-26 ENCOUNTER — Encounter: Payer: Self-pay | Admitting: Internal Medicine

## 2013-02-26 DIAGNOSIS — R52 Pain, unspecified: Secondary | ICD-10-CM | POA: Insufficient documentation

## 2013-02-26 DIAGNOSIS — Z9109 Other allergy status, other than to drugs and biological substances: Secondary | ICD-10-CM | POA: Insufficient documentation

## 2013-02-26 NOTE — Assessment & Plan Note (Signed)
Flonase and zyrtec as directed.  Follow.    

## 2013-02-26 NOTE — Assessment & Plan Note (Signed)
Continue CPAP.  

## 2013-02-26 NOTE — Assessment & Plan Note (Signed)
No abnormality noted on breast exam.  No abdominal pain.  Tylenol as directed.  Call with update over the next 1-2 weeks.  Further w/up pending response.

## 2013-02-26 NOTE — Assessment & Plan Note (Signed)
Recently found to have minimal disease on cath.  Aggressive risk factor modification.  Currently asymptomatic.  Follow.   

## 2013-02-28 ENCOUNTER — Other Ambulatory Visit: Payer: BC Managed Care – PPO

## 2013-03-01 ENCOUNTER — Other Ambulatory Visit (INDEPENDENT_AMBULATORY_CARE_PROVIDER_SITE_OTHER): Payer: BC Managed Care – PPO

## 2013-03-01 DIAGNOSIS — E119 Type 2 diabetes mellitus without complications: Secondary | ICD-10-CM

## 2013-03-01 DIAGNOSIS — G4730 Sleep apnea, unspecified: Secondary | ICD-10-CM

## 2013-03-01 DIAGNOSIS — I10 Essential (primary) hypertension: Secondary | ICD-10-CM

## 2013-03-01 DIAGNOSIS — E7800 Pure hypercholesterolemia, unspecified: Secondary | ICD-10-CM

## 2013-03-01 DIAGNOSIS — D649 Anemia, unspecified: Secondary | ICD-10-CM

## 2013-03-01 LAB — CBC WITH DIFFERENTIAL/PLATELET
Basophils Absolute: 0 10*3/uL (ref 0.0–0.1)
Eosinophils Absolute: 0.2 10*3/uL (ref 0.0–0.7)
Eosinophils Relative: 2.3 % (ref 0.0–5.0)
Hemoglobin: 10.7 g/dL — ABNORMAL LOW (ref 12.0–15.0)
Lymphocytes Relative: 29.5 % (ref 12.0–46.0)
MCHC: 32.6 g/dL (ref 30.0–36.0)
Monocytes Relative: 6 % (ref 3.0–12.0)
Neutro Abs: 5.1 10*3/uL (ref 1.4–7.7)
Neutrophils Relative %: 61.8 % (ref 43.0–77.0)
Platelets: 234 10*3/uL (ref 150.0–400.0)
RDW: 15.7 % — ABNORMAL HIGH (ref 11.5–14.6)
WBC: 8.3 10*3/uL (ref 4.5–10.5)

## 2013-03-01 LAB — HEPATIC FUNCTION PANEL
AST: 16 U/L (ref 0–37)
Albumin: 3.7 g/dL (ref 3.5–5.2)
Alkaline Phosphatase: 57 U/L (ref 39–117)
Total Bilirubin: 0.3 mg/dL (ref 0.3–1.2)

## 2013-03-01 LAB — TSH: TSH: 1.48 u[IU]/mL (ref 0.35–5.50)

## 2013-03-01 LAB — LIPID PANEL
Cholesterol: 167 mg/dL (ref 0–200)
LDL Cholesterol: 80 mg/dL (ref 0–99)
VLDL: 30.6 mg/dL (ref 0.0–40.0)

## 2013-03-01 LAB — BASIC METABOLIC PANEL
Chloride: 105 mEq/L (ref 96–112)
GFR: 136.65 mL/min (ref >60.00)
Potassium: 4.3 mEq/L (ref 3.5–5.1)

## 2013-03-01 LAB — MICROALBUMIN / CREATININE URINE RATIO
Creatinine,U: 230.8 mg/dL
Microalb Creat Ratio: 0.6 mg/g (ref 0.0–30.0)

## 2013-03-01 LAB — HEMOGLOBIN A1C: Hgb A1c MFr Bld: 6.6 % — ABNORMAL HIGH (ref 4.6–6.5)

## 2013-03-01 LAB — FERRITIN: Ferritin: 14.4 ng/mL (ref 10.0–291.0)

## 2013-03-10 ENCOUNTER — Other Ambulatory Visit: Payer: Self-pay | Admitting: Internal Medicine

## 2013-03-10 ENCOUNTER — Encounter: Payer: Self-pay | Admitting: Internal Medicine

## 2013-03-10 ENCOUNTER — Telehealth: Payer: Self-pay | Admitting: Internal Medicine

## 2013-03-10 DIAGNOSIS — D649 Anemia, unspecified: Secondary | ICD-10-CM

## 2013-03-10 NOTE — Telephone Encounter (Signed)
Pt notified of lab results and need for a f/u lab in 4-6 weeks.  Please schedule her for a non fasting lab appt in 4-6 weeks and contact her with an appt date and time.  Thanks.

## 2013-03-11 NOTE — Telephone Encounter (Signed)
Appointment 1/26  Sent pt my chart message letting pt know about appointment

## 2013-03-12 NOTE — Telephone Encounter (Signed)
Mailed unread message to pt  

## 2013-03-16 ENCOUNTER — Other Ambulatory Visit: Payer: Self-pay | Admitting: Internal Medicine

## 2013-03-17 ENCOUNTER — Other Ambulatory Visit: Payer: Self-pay | Admitting: Internal Medicine

## 2013-03-18 ENCOUNTER — Other Ambulatory Visit: Payer: Self-pay | Admitting: Internal Medicine

## 2013-03-18 ENCOUNTER — Encounter: Payer: Self-pay | Admitting: Internal Medicine

## 2013-03-18 DIAGNOSIS — M79621 Pain in right upper arm: Secondary | ICD-10-CM

## 2013-03-19 MED ORDER — CYCLOBENZAPRINE HCL 10 MG PO TABS: ORAL_TABLET | ORAL | Status: DC

## 2013-03-19 NOTE — Telephone Encounter (Signed)
See my Chart message.

## 2013-03-19 NOTE — Telephone Encounter (Signed)
Refilled flexeril #30 with no refills.   

## 2013-03-19 NOTE — Telephone Encounter (Signed)
Spoke to pt.  Having persistent pain right axilla.  Will check ultrasound.  States feels different from her other gallbladder pain.  Ultrasound ordered.

## 2013-03-20 NOTE — Telephone Encounter (Signed)
Rx faxed on 03/19/13 to [facility]

## 2013-03-25 ENCOUNTER — Ambulatory Visit: Payer: Self-pay | Admitting: Internal Medicine

## 2013-03-25 ENCOUNTER — Encounter: Payer: Self-pay | Admitting: Internal Medicine

## 2013-03-25 LAB — HM MAMMOGRAPHY: HM MAMMO: NEGATIVE

## 2013-04-08 ENCOUNTER — Encounter: Payer: Self-pay | Admitting: Internal Medicine

## 2013-04-08 ENCOUNTER — Other Ambulatory Visit (INDEPENDENT_AMBULATORY_CARE_PROVIDER_SITE_OTHER): Payer: BC Managed Care – PPO

## 2013-04-08 DIAGNOSIS — D509 Iron deficiency anemia, unspecified: Secondary | ICD-10-CM

## 2013-04-08 DIAGNOSIS — D649 Anemia, unspecified: Secondary | ICD-10-CM

## 2013-04-08 LAB — HEMOGLOBIN: HEMOGLOBIN: 10.1 g/dL — AB (ref 12.0–15.0)

## 2013-04-08 LAB — FERRITIN: FERRITIN: 11.4 ng/mL (ref 10.0–291.0)

## 2013-04-09 NOTE — Telephone Encounter (Signed)
Order placed for appt with GI

## 2013-04-13 ENCOUNTER — Other Ambulatory Visit: Payer: Self-pay | Admitting: Internal Medicine

## 2013-04-19 ENCOUNTER — Telehealth: Payer: Self-pay | Admitting: Internal Medicine

## 2013-04-19 NOTE — Telephone Encounter (Signed)
My chart message sent to see if pt still with right side pain/axillary pain.

## 2013-04-22 NOTE — Telephone Encounter (Signed)
Mailed unread message to pt, requested call back to office to discuss 

## 2013-04-24 ENCOUNTER — Other Ambulatory Visit (INDEPENDENT_AMBULATORY_CARE_PROVIDER_SITE_OTHER): Payer: BC Managed Care – PPO

## 2013-04-24 ENCOUNTER — Telehealth: Payer: Self-pay | Admitting: *Deleted

## 2013-04-24 DIAGNOSIS — D649 Anemia, unspecified: Secondary | ICD-10-CM

## 2013-04-24 NOTE — Telephone Encounter (Signed)
Order placed for B12

## 2013-04-24 NOTE — Telephone Encounter (Signed)
What labs and dx?  

## 2013-04-25 LAB — VITAMIN B12: Vitamin B-12: 99 pg/mL — ABNORMAL LOW (ref 211–911)

## 2013-04-26 ENCOUNTER — Encounter: Payer: Self-pay | Admitting: Internal Medicine

## 2013-04-29 ENCOUNTER — Ambulatory Visit: Payer: BC Managed Care – PPO

## 2013-04-29 ENCOUNTER — Ambulatory Visit (INDEPENDENT_AMBULATORY_CARE_PROVIDER_SITE_OTHER): Payer: BC Managed Care – PPO | Admitting: *Deleted

## 2013-04-29 ENCOUNTER — Other Ambulatory Visit: Payer: Self-pay | Admitting: *Deleted

## 2013-04-29 DIAGNOSIS — E538 Deficiency of other specified B group vitamins: Secondary | ICD-10-CM

## 2013-04-29 MED ORDER — CYANOCOBALAMIN 1000 MCG/ML IJ SOLN: INTRAMUSCULAR | Status: DC

## 2013-04-29 MED ORDER — CYANOCOBALAMIN 1000 MCG/ML IJ SOLN
1000.0000 ug | Freq: Once | INTRAMUSCULAR | Status: AC
  Administered 2013-04-29: 1000 ug via INTRAMUSCULAR

## 2013-05-02 ENCOUNTER — Encounter: Payer: Self-pay | Admitting: Internal Medicine

## 2013-05-06 ENCOUNTER — Other Ambulatory Visit: Payer: Self-pay | Admitting: Gastroenterology

## 2013-05-18 ENCOUNTER — Encounter: Payer: Self-pay | Admitting: Internal Medicine

## 2013-05-19 ENCOUNTER — Other Ambulatory Visit: Payer: Self-pay | Admitting: Internal Medicine

## 2013-05-20 ENCOUNTER — Ambulatory Visit: Payer: Self-pay | Admitting: Gastroenterology

## 2013-05-20 LAB — HM COLONOSCOPY

## 2013-05-21 ENCOUNTER — Other Ambulatory Visit: Payer: Self-pay | Admitting: Internal Medicine

## 2013-05-21 NOTE — Telephone Encounter (Signed)
Pt picked up glucometer.

## 2013-05-22 ENCOUNTER — Other Ambulatory Visit: Payer: Self-pay | Admitting: Internal Medicine

## 2013-05-22 LAB — PATHOLOGY REPORT

## 2013-05-27 ENCOUNTER — Encounter: Payer: Self-pay | Admitting: Internal Medicine

## 2013-05-27 ENCOUNTER — Other Ambulatory Visit: Payer: Self-pay | Admitting: Internal Medicine

## 2013-06-24 ENCOUNTER — Other Ambulatory Visit: Payer: Self-pay | Admitting: Internal Medicine

## 2013-06-25 ENCOUNTER — Ambulatory Visit: Payer: Self-pay | Admitting: Gastroenterology

## 2013-06-28 ENCOUNTER — Encounter: Payer: Self-pay | Admitting: Internal Medicine

## 2013-06-28 ENCOUNTER — Ambulatory Visit (INDEPENDENT_AMBULATORY_CARE_PROVIDER_SITE_OTHER): Payer: BC Managed Care – PPO | Admitting: Internal Medicine

## 2013-06-28 VITALS — BP 110/70 | HR 77 | Temp 98.1°F | Ht 64.0 in | Wt 238.5 lb

## 2013-06-28 DIAGNOSIS — D649 Anemia, unspecified: Secondary | ICD-10-CM

## 2013-06-28 DIAGNOSIS — M549 Dorsalgia, unspecified: Secondary | ICD-10-CM

## 2013-06-28 DIAGNOSIS — G4730 Sleep apnea, unspecified: Secondary | ICD-10-CM

## 2013-06-28 DIAGNOSIS — E7800 Pure hypercholesterolemia, unspecified: Secondary | ICD-10-CM

## 2013-06-28 DIAGNOSIS — E119 Type 2 diabetes mellitus without complications: Secondary | ICD-10-CM

## 2013-06-28 DIAGNOSIS — I10 Essential (primary) hypertension: Secondary | ICD-10-CM

## 2013-06-28 DIAGNOSIS — I2510 Atherosclerotic heart disease of native coronary artery without angina pectoris: Secondary | ICD-10-CM

## 2013-06-28 LAB — CBC WITH DIFFERENTIAL/PLATELET
BASOS ABS: 0 10*3/uL (ref 0.0–0.1)
Basophils Relative: 0.3 % (ref 0.0–3.0)
Eosinophils Absolute: 0.2 10*3/uL (ref 0.0–0.7)
Eosinophils Relative: 1.8 % (ref 0.0–5.0)
HEMATOCRIT: 32.1 % — AB (ref 36.0–46.0)
Hemoglobin: 10.5 g/dL — ABNORMAL LOW (ref 12.0–15.0)
LYMPHS ABS: 2.4 10*3/uL (ref 0.7–4.0)
Lymphocytes Relative: 25.5 % (ref 12.0–46.0)
MCHC: 32.8 g/dL (ref 30.0–36.0)
MCV: 83.2 fl (ref 78.0–100.0)
Monocytes Absolute: 0.5 10*3/uL (ref 0.1–1.0)
Monocytes Relative: 5.1 % (ref 3.0–12.0)
Neutro Abs: 6.2 10*3/uL (ref 1.4–7.7)
Neutrophils Relative %: 67.3 % (ref 43.0–77.0)
Platelets: 217 10*3/uL (ref 150.0–400.0)
RBC: 3.86 Mil/uL — ABNORMAL LOW (ref 3.87–5.11)
RDW: 15 % — AB (ref 11.5–14.6)
WBC: 9.2 10*3/uL (ref 4.5–10.5)

## 2013-06-28 LAB — LIPID PANEL
CHOLESTEROL: 153 mg/dL (ref 0–200)
HDL: 54.3 mg/dL (ref >39.00)
LDL CALC: 67 mg/dL (ref 0–99)
Total CHOL/HDL Ratio: 3
Triglycerides: 158 mg/dL — ABNORMAL HIGH (ref 0.0–149.0)
VLDL: 31.6 mg/dL (ref 0.0–40.0)

## 2013-06-28 LAB — URINALYSIS, ROUTINE W REFLEX MICROSCOPIC
BILIRUBIN URINE: NEGATIVE
HGB URINE DIPSTICK: NEGATIVE
KETONES UR: NEGATIVE
LEUKOCYTES UA: NEGATIVE
Nitrite: NEGATIVE
PH: 6.5 (ref 5.0–8.0)
Specific Gravity, Urine: 1.02 (ref 1.000–1.030)
Total Protein, Urine: NEGATIVE
Urine Glucose: NEGATIVE
Urobilinogen, UA: 0.2 (ref 0.0–1.0)

## 2013-06-28 LAB — HEPATIC FUNCTION PANEL
ALBUMIN: 3.3 g/dL — AB (ref 3.5–5.2)
ALK PHOS: 57 U/L (ref 39–117)
ALT: 15 U/L (ref 0–35)
AST: 16 U/L (ref 0–37)
BILIRUBIN DIRECT: 0 mg/dL (ref 0.0–0.3)
TOTAL PROTEIN: 6.5 g/dL (ref 6.0–8.3)
Total Bilirubin: 0.5 mg/dL (ref 0.3–1.2)

## 2013-06-28 LAB — BASIC METABOLIC PANEL
BUN: 11 mg/dL (ref 6–23)
CALCIUM: 8.8 mg/dL (ref 8.4–10.5)
CHLORIDE: 103 meq/L (ref 96–112)
CO2: 29 mEq/L (ref 19–32)
CREATININE: 0.6 mg/dL (ref 0.4–1.2)
GFR: 117.33 mL/min (ref >60.00)
Glucose, Bld: 120 mg/dL — ABNORMAL HIGH (ref 70–99)
Potassium: 4 mEq/L (ref 3.5–5.1)
Sodium: 141 mEq/L (ref 135–145)

## 2013-06-28 LAB — FERRITIN: Ferritin: 15.2 ng/mL (ref 10.0–291.0)

## 2013-06-28 LAB — HEMOGLOBIN A1C: Hgb A1c MFr Bld: 6.6 % — ABNORMAL HIGH (ref 4.6–6.5)

## 2013-06-28 NOTE — Progress Notes (Signed)
Subjective:    Patient ID: Evelyn James, female    DOB: 1960-05-19, 53 y.o.   MRN: [id number]  HPI 53 year old female with past history of diabetes, hypercholesterolemia, hypertension and reoccurring allergy problems who comes in today for a scheduled follow up.  She states she has been doing relatively well.  Last a1c improved - 6.6.  Previously added amaryl to her med regimen.  Her sugars are doing better. States in the am sugars averaging 130-140 and pm 150s.  She has adjusted her diet.  Brought in no recorded sugar readings.  No significant problems with lows.  She is not exercising regularly.    Bowels stable. She does report some nausea in the evening.  Taking protonix in the am.  She saw GI.  Back on Bentyl and carafate for one month.  Has f/u planned with GI.  She is having some right lower back pain.  More sore first thing in the am.  No abdominal pain.  Bowels better.       Past Medical History  Diagnosis Date  . Diabetes mellitus   . Hypertension   . Hypercholesterolemia   . Reflux   . TMJ dysfunction   . Anemia     Noted 11/2011  . Chest pain     Admitted 11/2011: cath with mild nonobstructive coronary plaque, normal EF, negative cardiac enzymes and negative d-dimer.    Current Outpatient Prescriptions on File Prior to Visit  Medication Sig Dispense Refill  . BAYER CONTOUR TEST test strip CHECK BLOOD SUGAR TWICE A DAY  100 each  3  . cetirizine (ZYRTEC) 10 MG tablet Take 10 mg by mouth daily.      . cyanocobalamin (,VITAMIN B-12,) 1000 MCG/ML injection INJECT 1ML INTO THIGH ONCE A WEEK X 3 WEEKS, THEN ONCE A MONTH  3 mL  3  . cyclobenzaprine (FLEXERIL) 10 MG tablet 1/2 - 1 tablet q pm prn  30 tablet  0  . escitalopram (LEXAPRO) 10 MG tablet TAKE 2 TABLETS BY MOUTH EVERY DAY  60 tablet  2  . Fe Fum-FePoly-Vit C-Vit B3 (INTEGRA) 62.5-62.5-40-3 MG CAPS TAKE ONE CAPSULE 2 TIMES A DAY  60 capsule  5  . fluticasone (FLONASE) 50 MCG/ACT nasal spray USE 2 SPRAYS IN EACH NOSTRIL  EVERY DAY  16 g  3  . glimepiride (AMARYL) 2 MG tablet TAKE 1 TABLET (2 MG TOTAL) BY MOUTH DAILY BEFORE BREAKFAST.  30 tablet  5  . lisinopril (PRINIVIL,ZESTRIL) 20 MG tablet TAKE 1 TABLET BY MOUTH EVERY DAY  90 tablet  1  . metFORMIN (GLUCOPHAGE) 1000 MG tablet TAKE 1 TABLET BY MOUTH TWICE A DAY WITH A MEAL  180 tablet  1  . naproxen sodium (ANAPROX) 220 MG tablet Take 220 mg by mouth daily as needed. For pain      . pantoprazole (PROTONIX) 40 MG tablet TAKE 1 TABLET BY MOUTH EVERY DAY  90 tablet  1  . pravastatin (PRAVACHOL) 40 MG tablet TAKE 1 TABLET (40 MG TOTAL) BY MOUTH DAILY.  90 tablet  1  . PREMARIN 0.45 MG tablet TAKE 1 TABLET BY MOUTH EVERY DAY  30 tablet  5   No current facility-administered medications on file prior to visit.    Review of Systems Patient denies any headache, lightheadedness or dizziness.   No chest pain, tightness or palpitations.  No increased shortness of breath, cough or congestion.  No vomiting.  Some pm nausea.  No significant acid reflux reported.  No abdominal pain or cramping.  No bowel change, such as diarrhea, constipation, BRBPR or melana.  No urine change.  Not exercising regularly.  Trying to watch what she eats.  Sugars as outlined.    Feels she is handling stress well.  Right lower back pain as outlined.        Objective:   Physical Exam  Filed Vitals:   06/28/13 0802  BP: 110/70  Pulse: 77  Temp: 98.1 F (36.7 C)   Blood pressure recheck:  118/72, pulse 4580  53 year old female in no acute distress.   HEENT:  Nares- clear.  Oropharynx - without lesions. NECK:  Supple.  Nontender.  No audible bruit.  HEART:  Appears to be regular. LUNGS:  No crackles or wheezing audible.  Respirations even and unlabored.  RADIAL PULSE:  Equal bilaterally.  ABDOMEN:  Soft, nontender.  Bowel sounds present and normal.  No audible abdominal bruit.    EXTREMITIES:  No increased edema present.  DP pulses palpable and equal bilaterally.   FEET:  Without  lesions.    MSK:  No pain to palpation.  No CVA tenderness.  No pain with straight leg raise.         Assessment & Plan:  GI.  Colonoscopy 05/04/10 negative for colitis.  Two adenomas removed.  EGD revealed gastritis.  Bx negative for sprue, Barretts and H. Pylori.  Saw Dr Katrinka BlazingSmith.  Had abdominal ultrasound, HIDA (normal function) and MRI liver - normal.  Had hernia repair 05/18/10.   On Protonix.  Some pm nausea.  Can increase protonix to bid.  Follow.  If persistent nausea will require further w/up.        GYN.  S/p hysterectomy and BSO.  Currently asymptomatic.    INCREASED PSYCHOSOCIAL STRESSORS.  Stable.    HEALTH MAINTENANCE.  Physical 02/25/13.  She is s/p hysterectomy.  GI as outlined above.  Mammogram 05/23/12 - BiRADS II.  Had f/u mammogram 03/25/13 Birads I.

## 2013-06-28 NOTE — Progress Notes (Signed)
Pre visit review using our clinic review tool, if applicable. No additional management support is needed unless otherwise documented below in the visit note. 

## 2013-06-30 ENCOUNTER — Encounter: Payer: Self-pay | Admitting: Internal Medicine

## 2013-06-30 DIAGNOSIS — M545 Low back pain, unspecified: Secondary | ICD-10-CM | POA: Insufficient documentation

## 2013-06-30 DIAGNOSIS — M549 Dorsalgia, unspecified: Secondary | ICD-10-CM | POA: Insufficient documentation

## 2013-06-30 NOTE — Assessment & Plan Note (Signed)
Last a1c improved.  Discussed the importance of diet and exercise.  Keep up to date with eye checks.  On metformin and amaryl.  Sugars as outlined.   Follow.   No lows.  Follow met b and a1c.

## 2013-06-30 NOTE — Assessment & Plan Note (Signed)
Continue CPAP.  

## 2013-06-30 NOTE — Assessment & Plan Note (Signed)
Recently found to have minimal disease on cath.  Aggressive risk factor modification.  Currently asymptomatic.  Follow.

## 2013-06-30 NOTE — Assessment & Plan Note (Signed)
Follow lipid panel.  Continue low cholesterol diet, exercise and current med regimen.

## 2013-06-30 NOTE — Assessment & Plan Note (Signed)
Continue iron supplements.  States has been taking regularly.   Has had extensive GI w/up as outlined.   Recheck cbc/ferritin.

## 2013-06-30 NOTE — Assessment & Plan Note (Signed)
Blood pressure under good control.  Same medication regimen.  Follow.   

## 2013-06-30 NOTE — Assessment & Plan Note (Addendum)
Right lower back pain.  Unclear as to the exact etiology.  Tylenol for pain.  Hold on xray.  Follow.  Further w/up pending.  Check urinalysis to confirm no infection.

## 2013-07-08 ENCOUNTER — Other Ambulatory Visit: Payer: Self-pay | Admitting: Internal Medicine

## 2013-07-28 ENCOUNTER — Other Ambulatory Visit: Payer: Self-pay | Admitting: Internal Medicine

## 2013-07-31 ENCOUNTER — Encounter: Payer: Self-pay | Admitting: Internal Medicine

## 2013-07-31 ENCOUNTER — Ambulatory Visit (INDEPENDENT_AMBULATORY_CARE_PROVIDER_SITE_OTHER): Payer: BC Managed Care – PPO | Admitting: Internal Medicine

## 2013-07-31 VITALS — BP 110/70 | HR 82 | Temp 98.0°F | Ht 64.0 in | Wt 235.5 lb

## 2013-07-31 DIAGNOSIS — G4730 Sleep apnea, unspecified: Secondary | ICD-10-CM

## 2013-07-31 DIAGNOSIS — D649 Anemia, unspecified: Secondary | ICD-10-CM

## 2013-07-31 DIAGNOSIS — I2510 Atherosclerotic heart disease of native coronary artery without angina pectoris: Secondary | ICD-10-CM

## 2013-07-31 DIAGNOSIS — E119 Type 2 diabetes mellitus without complications: Secondary | ICD-10-CM

## 2013-07-31 DIAGNOSIS — Z9109 Other allergy status, other than to drugs and biological substances: Secondary | ICD-10-CM

## 2013-07-31 DIAGNOSIS — E7800 Pure hypercholesterolemia, unspecified: Secondary | ICD-10-CM

## 2013-07-31 DIAGNOSIS — I10 Essential (primary) hypertension: Secondary | ICD-10-CM

## 2013-07-31 DIAGNOSIS — R21 Rash and other nonspecific skin eruption: Secondary | ICD-10-CM

## 2013-07-31 NOTE — Progress Notes (Signed)
Subjective:    Patient ID: Evelyn James, female    DOB: 09-01-1960, 53 y.o.   MRN: [id number]  HPI 53 year old female with past history of diabetes, hypercholesterolemia, hypertension and reoccurring allergy problems who comes in today for a scheduled follow up.  She states she has been doing relatively well.  Last a1c improved - 6.6.  Previously added amaryl to her med regimen.  Her sugars are doing better. States in the am sugars averaging 120s and pm 130s.  She has adjusted her diet.  Brought in no recorded sugar readings.  No significant problems with lows.  She is not exercising regularly.    Bowels stable.  Previously was having nausea in the pm.  Placed on Protonix bid.  Nausea improved.  Not a significant issue for her now.  No abdominal pain.  Bowels better.  Some fatigue.  No chest pain or tightness.  Breathing stable.  Has seen GI.  Planning for capsule study 09/12/13.       Past Medical History  Diagnosis Date  . Diabetes mellitus   . Hypertension   . Hypercholesterolemia   . Reflux   . TMJ dysfunction   . Anemia     Noted 11/2011  . Chest pain     Admitted 11/2011: cath with mild nonobstructive coronary plaque, normal EF, negative cardiac enzymes and negative d-dimer.    Current Outpatient Prescriptions on File Prior to Visit  Medication Sig Dispense Refill  . BAYER CONTOUR TEST test strip CHECK BLOOD SUGAR TWICE A DAY  100 each  3  . cetirizine (ZYRTEC) 10 MG tablet Take 10 mg by mouth daily.      . cyanocobalamin (,VITAMIN B-12,) 1000 MCG/ML injection INJECT 1ML INTO THIGH ONCE A WEEK X 3 WEEKS, THEN ONCE A MONTH  3 mL  3  . cyclobenzaprine (FLEXERIL) 10 MG tablet 1/2 - 1 tablet q pm prn  30 tablet  0  . escitalopram (LEXAPRO) 10 MG tablet TAKE 2 TABLETS BY MOUTH EVERY DAY  60 tablet  5  . Fe Fum-FePoly-Vit C-Vit B3 (INTEGRA) 62.5-62.5-40-3 MG CAPS TAKE ONE CAPSULE 2 TIMES A DAY  60 capsule  5  . fluticasone (FLONASE) 50 MCG/ACT nasal spray USE 2 SPRAYS IN EACH NOSTRIL  EVERY DAY  16 g  3  . glimepiride (AMARYL) 2 MG tablet TAKE 1 TABLET (2 MG TOTAL) BY MOUTH DAILY BEFORE BREAKFAST.  30 tablet  5  . lisinopril (PRINIVIL,ZESTRIL) 20 MG tablet TAKE 1 TABLET BY MOUTH EVERY DAY  90 tablet  1  . metFORMIN (GLUCOPHAGE) 1000 MG tablet TAKE 1 TABLET BY MOUTH TWICE A DAY WITH A MEAL  180 tablet  1  . naproxen sodium (ANAPROX) 220 MG tablet Take 220 mg by mouth daily as needed. For pain      . pantoprazole (PROTONIX) 40 MG tablet TAKE 1 TABLET BY MOUTH EVERY DAY  90 tablet  1  . pravastatin (PRAVACHOL) 40 MG tablet TAKE 1 TABLET (40 MG TOTAL) BY MOUTH DAILY.  90 tablet  1  . PREMARIN 0.45 MG tablet TAKE 1 TABLET BY MOUTH EVERY DAY  30 tablet  5   No current facility-administered medications on file prior to visit.    Review of Systems Patient denies any headache, lightheadedness or dizziness.   No chest pain, tightness or palpitations.  No increased shortness of breath, cough or congestion.  No vomiting.  Nausea has improved.  No significant acid reflux reported.  No abdominal pain or  cramping.  No bowel change, such as diarrhea, constipation, BRBPR or melana.  No urine change.  Not exercising regularly.  Trying to watch what she eats.  Sugars as outlined.    Feels she is handling stress well.       Objective:   Physical Exam  Filed Vitals:   07/31/13 0814  BP: 110/70  Pulse: 82  Temp: 98 F (2136.207 C)   53 year old female in no acute distress.   HEENT:  Nares- clear.  Oropharynx - without lesions. NECK:  Supple.  Nontender.  No audible bruit.  HEART:  Appears to be regular. LUNGS:  No crackles or wheezing audible.  Respirations even and unlabored.  RADIAL PULSE:  Equal bilaterally.  ABDOMEN:  Soft, nontender.  Bowel sounds present and normal.  No audible abdominal bruit.    EXTREMITIES:  No increased edema present.  DP pulses palpable and equal bilaterally.   FEET:  Without lesions.   SKIN:  Erythematous rash - circular area right top of ankle and foot.          Assessment & Plan:  GI.  Colonoscopy 05/04/10 negative for colitis.  Two adenomas removed.  EGD revealed gastritis.  Bx negative for sprue, Barretts and H. Pylori.  Saw Dr Katrinka BlazingSmith.  Had abdominal ultrasound, HIDA (normal function) and MRI liver - normal.  Had hernia repair 05/18/10.   On Protonix.  Was having pm nausea.  protonix increased to bid.  Nausea better.  Saw GI.  Had colonoscopy 05/20/13 - diverticulosis.  Planning for capsule study 09/12/13.         GYN.  S/p hysterectomy and BSO.  Currently asymptomatic.    INCREASED PSYCHOSOCIAL STRESSORS.  Stable.    HEALTH MAINTENANCE.  Physical 02/25/13.  She is s/p hysterectomy.  GI as outlined above.  Mammogram 05/23/12 - BiRADS II.  Had f/u mammogram 03/25/13 Birads I.    I spent 25 minutes with the patient and 50% of the time was spent in consultation regarding the above.

## 2013-07-31 NOTE — Progress Notes (Signed)
Pre visit review using our clinic review tool, if applicable. No additional management support is needed unless otherwise documented below in the visit note. 

## 2013-08-05 ENCOUNTER — Encounter: Payer: Self-pay | Admitting: Internal Medicine

## 2013-08-05 DIAGNOSIS — R21 Rash and other nonspecific skin eruption: Secondary | ICD-10-CM | POA: Insufficient documentation

## 2013-08-05 NOTE — Assessment & Plan Note (Signed)
Flonase and zyrtec as directed.  Follow.

## 2013-08-05 NOTE — Assessment & Plan Note (Signed)
Continue iron supplements.  States has been taking regularly.   Has had extensive GI w/up as outlined.   Follow cbc/ferritin.  Planning for capsule study 09/12/13.

## 2013-08-05 NOTE — Assessment & Plan Note (Signed)
Continue CPAP.  

## 2013-08-05 NOTE — Assessment & Plan Note (Signed)
lotrisone cream as directed.  Follow.  Notify me if persistent.

## 2013-08-05 NOTE — Assessment & Plan Note (Signed)
Blood pressure under good control.  Same medication regimen.  Follow.   

## 2013-08-05 NOTE — Assessment & Plan Note (Signed)
Last a1c improved.  Discussed the importance of diet and exercise.  Keep up to date with eye checks.  On metformin and amaryl.  Sugars as outlined.  Improved.  Follow.   No lows.  Follow met b and a1c.

## 2013-08-05 NOTE — Assessment & Plan Note (Signed)
Recently found to have minimal disease on cath.  Aggressive risk factor modification.  Currently asymptomatic.  Follow.

## 2013-08-05 NOTE — Assessment & Plan Note (Signed)
Follow lipid panel.  Continue low cholesterol diet, exercise and current med regimen.

## 2013-08-30 ENCOUNTER — Other Ambulatory Visit: Payer: Self-pay | Admitting: Internal Medicine

## 2013-09-30 DIAGNOSIS — K589 Irritable bowel syndrome without diarrhea: Secondary | ICD-10-CM | POA: Insufficient documentation

## 2013-11-07 ENCOUNTER — Encounter: Payer: Self-pay | Admitting: Internal Medicine

## 2013-11-11 ENCOUNTER — Other Ambulatory Visit (INDEPENDENT_AMBULATORY_CARE_PROVIDER_SITE_OTHER): Payer: BC Managed Care – PPO

## 2013-11-11 DIAGNOSIS — G4730 Sleep apnea, unspecified: Secondary | ICD-10-CM

## 2013-11-11 DIAGNOSIS — I10 Essential (primary) hypertension: Secondary | ICD-10-CM

## 2013-11-11 DIAGNOSIS — E119 Type 2 diabetes mellitus without complications: Secondary | ICD-10-CM

## 2013-11-11 DIAGNOSIS — D649 Anemia, unspecified: Secondary | ICD-10-CM

## 2013-11-11 DIAGNOSIS — E7800 Pure hypercholesterolemia, unspecified: Secondary | ICD-10-CM

## 2013-11-11 LAB — CBC WITH DIFFERENTIAL/PLATELET
BASOS PCT: 0.6 % (ref 0.0–3.0)
Basophils Absolute: 0 10*3/uL (ref 0.0–0.1)
EOS PCT: 2.1 % (ref 0.0–5.0)
Eosinophils Absolute: 0.2 10*3/uL (ref 0.0–0.7)
HEMATOCRIT: 34.3 % — AB (ref 36.0–46.0)
Hemoglobin: 11.1 g/dL — ABNORMAL LOW (ref 12.0–15.0)
LYMPHS ABS: 2.4 10*3/uL (ref 0.7–4.0)
Lymphocytes Relative: 30.1 % (ref 12.0–46.0)
MCHC: 32.3 g/dL (ref 30.0–36.0)
MCV: 83.6 fl (ref 78.0–100.0)
Monocytes Absolute: 0.4 10*3/uL (ref 0.1–1.0)
Monocytes Relative: 5.5 % (ref 3.0–12.0)
NEUTROS ABS: 4.8 10*3/uL (ref 1.4–7.7)
Neutrophils Relative %: 61.7 % (ref 43.0–77.0)
Platelets: 214 10*3/uL (ref 150.0–400.0)
RBC: 4.11 Mil/uL (ref 3.87–5.11)
RDW: 14.8 % (ref 11.5–15.5)
WBC: 7.8 10*3/uL (ref 4.0–10.5)

## 2013-11-11 LAB — HEMOGLOBIN A1C: Hgb A1c MFr Bld: 7.5 % — ABNORMAL HIGH (ref 4.6–6.5)

## 2013-11-11 LAB — LIPID PANEL
Cholesterol: 163 mg/dL (ref 0–200)
HDL: 56.9 mg/dL (ref >39.00)
LDL Cholesterol: 79 mg/dL (ref 0–99)
NONHDL: 106.1
Total CHOL/HDL Ratio: 3
Triglycerides: 138 mg/dL (ref 0.0–149.0)
VLDL: 27.6 mg/dL (ref 0.0–40.0)

## 2013-11-11 LAB — HEPATIC FUNCTION PANEL
ALT: 27 U/L (ref 0–35)
AST: 28 U/L (ref 0–37)
Albumin: 3.5 g/dL (ref 3.5–5.2)
Alkaline Phosphatase: 57 U/L (ref 39–117)
BILIRUBIN TOTAL: 0.5 mg/dL (ref 0.2–1.2)
Bilirubin, Direct: 0.1 mg/dL (ref 0.0–0.3)
Total Protein: 7.2 g/dL (ref 6.0–8.3)

## 2013-11-11 LAB — BASIC METABOLIC PANEL
BUN: 12 mg/dL (ref 6–23)
CO2: 28 mEq/L (ref 19–32)
Calcium: 8.9 mg/dL (ref 8.4–10.5)
Chloride: 102 mEq/L (ref 96–112)
Creatinine, Ser: 0.7 mg/dL (ref 0.4–1.2)
GFR: 100.69 mL/min (ref >60.00)
GLUCOSE: 133 mg/dL — AB (ref 70–99)
POTASSIUM: 4.2 meq/L (ref 3.5–5.1)
SODIUM: 140 meq/L (ref 135–145)

## 2013-11-11 LAB — TSH: TSH: 1.25 u[IU]/mL (ref 0.35–4.50)

## 2013-11-11 LAB — FERRITIN: Ferritin: 24.4 ng/mL (ref 10.0–291.0)

## 2013-11-12 ENCOUNTER — Encounter: Payer: Self-pay | Admitting: Internal Medicine

## 2013-11-12 ENCOUNTER — Other Ambulatory Visit: Payer: Self-pay | Admitting: Internal Medicine

## 2013-11-14 ENCOUNTER — Encounter: Payer: Self-pay | Admitting: Internal Medicine

## 2013-11-14 ENCOUNTER — Ambulatory Visit (INDEPENDENT_AMBULATORY_CARE_PROVIDER_SITE_OTHER): Payer: BC Managed Care – PPO | Admitting: Internal Medicine

## 2013-11-14 VITALS — BP 110/80 | HR 75 | Temp 98.0°F | Ht 64.0 in | Wt 240.5 lb

## 2013-11-14 DIAGNOSIS — I2510 Atherosclerotic heart disease of native coronary artery without angina pectoris: Secondary | ICD-10-CM

## 2013-11-14 DIAGNOSIS — Z23 Encounter for immunization: Secondary | ICD-10-CM

## 2013-11-14 DIAGNOSIS — E119 Type 2 diabetes mellitus without complications: Secondary | ICD-10-CM

## 2013-11-14 DIAGNOSIS — G4730 Sleep apnea, unspecified: Secondary | ICD-10-CM

## 2013-11-14 DIAGNOSIS — E7800 Pure hypercholesterolemia, unspecified: Secondary | ICD-10-CM

## 2013-11-14 DIAGNOSIS — D649 Anemia, unspecified: Secondary | ICD-10-CM

## 2013-11-14 DIAGNOSIS — I10 Essential (primary) hypertension: Secondary | ICD-10-CM

## 2013-11-14 MED ORDER — CYCLOBENZAPRINE HCL 10 MG PO TABS: ORAL_TABLET | ORAL | Status: DC

## 2013-11-14 MED ORDER — SITAGLIPTIN PHOSPHATE 100 MG PO TABS: 100.0000 mg | ORAL_TABLET | Freq: Every day | ORAL | Status: DC

## 2013-11-14 NOTE — Progress Notes (Signed)
Subjective:    Patient ID: Evelyn James, female    DOB: 08-18-1960, 53 y.o.   MRN: [id number]  HPI 53 year old female with past history of diabetes, hypercholesterolemia, hypertension and reoccurring allergy problems who comes in today for a scheduled follow up.  She states she has been doing relatively well.  A1c just checked and elevated - 7.5.   On metformin and amaryl.  Only taking 1/2 of her amaryl on average three times per week.  Does not take regularly because of concern regarding side effects and low sugar.  States in the am sugars averaging 130s and pm sugars averaging 160-200.   She has not been watching her diet.  She is not exercising regularly.  No abdominal pain.  Bowels better.  No chest pain or tightness.  Breathing stable.  Has seen GI.      Past Medical History  Diagnosis Date  . Diabetes mellitus   . Hypertension   . Hypercholesterolemia   . Reflux   . TMJ dysfunction   . Anemia     Noted 11/2011  . Chest pain     Admitted 11/2011: cath with mild nonobstructive coronary plaque, normal EF, negative cardiac enzymes and negative d-dimer.    Current Outpatient Prescriptions on File Prior to Visit  Medication Sig Dispense Refill  . BAYER CONTOUR TEST test strip CHECK BLOOD SUGAR TWICE A DAY  100 each  3  . cetirizine (ZYRTEC) 10 MG tablet Take 10 mg by mouth daily.      . cyanocobalamin (,VITAMIN B-12,) 1000 MCG/ML injection INJECT INTO THIGH ONCE A WEEK X 3 WEEKS, THEN ONCE A MONTH  3 mL  3  . cyclobenzaprine (FLEXERIL) 10 MG tablet 1/2 - 1 tablet q pm prn  30 tablet  0  . escitalopram (LEXAPRO) 10 MG tablet TAKE 2 TABLETS BY MOUTH EVERY DAY  60 tablet  5  . Fe Fum-FePoly-Vit C-Vit B3 (INTEGRA) 62.5-62.5-40-3 MG CAPS TAKE ONE CAPSULE BY MOUTH TWICE A DAY  60 capsule  5  . fluticasone (FLONASE) 50 MCG/ACT nasal spray USE 2 SPRAYS IN EACH NOSTRIL EVERY DAY  16 g  3  . glimepiride (AMARYL) 2 MG tablet TAKE 1 TABLET (2 MG TOTAL) BY MOUTH DAILY BEFORE BREAKFAST.  30  tablet  5  . lisinopril (PRINIVIL,ZESTRIL) 20 MG tablet TAKE 1 TABLET BY MOUTH EVERY DAY  90 tablet  1  . metFORMIN (GLUCOPHAGE) 1000 MG tablet TAKE 1 TABLET BY MOUTH TWICE A DAY WITH A MEAL  180 tablet  1  . naproxen sodium (ANAPROX) 220 MG tablet Take 220 mg by mouth daily as needed. For pain      . pantoprazole (PROTONIX) 40 MG tablet TAKE 1 TABLET BY MOUTH EVERY DAY  90 tablet  1  . pravastatin (PRAVACHOL) 40 MG tablet TAKE 1 TABLET (40 MG TOTAL) BY MOUTH DAILY.  90 tablet  1  . PREMARIN 0.45 MG tablet TAKE 1 TABLET BY MOUTH EVERY DAY  30 tablet  5   No current facility-administered medications on file prior to visit.    Review of Systems Patient denies any headache, lightheadedness or dizziness.   No chest pain, tightness or palpitations.  No increased shortness of breath, cough or congestion.  No vomiting.  No nausea.  No acid reflux reported.  No abdominal pain or cramping.  No bowel change, such as diarrhea, constipation, BRBPR or melana.  No urine change.  Not exercising regularly.  Sugars as outlined.  Not watching her diet.  Increased stress.  Feels she is handling stress well.       Objective:   Physical Exam  Filed Vitals:   11/14/13 0802  BP: 110/80  Pulse: 75  Temp: 98 F (36.7 C)   Blood pressure recheck:  13/80  53 year old female in no acute distress.   HEENT:  Nares- clear.  Oropharynx - without lesions. NECK:  Supple.  Nontender.  No audible bruit.  HEART:  Appears to be regular. LUNGS:  No crackles or wheezing audible.  Respirations even and unlabored.  RADIAL PULSE:  Equal bilaterally.  ABDOMEN:  Soft, nontender.  Bowel sounds present and normal.  No audible abdominal bruit.    EXTREMITIES:  No increased edema present.  DP pulses palpable and equal bilaterally.   FEET:  Without lesions.        Assessment & Plan:  GI.  Colonoscopy 05/04/10 negative for colitis.  Two adenomas removed.  EGD revealed gastritis.  Bx negative for sprue, Barretts and H. Pylori.   Saw Dr Katrinka Blazing.  Had abdominal ultrasound, HIDA (normal function) and MRI liver - normal.  Had hernia repair 05/18/10.   On Protonix.  Was having pm nausea.  Protonix increased to bid.  Nausea better.  Saw GI.  Had colonoscopy 05/20/13 - diverticulosis.          GYN.  S/p hysterectomy and BSO.  Currently asymptomatic.    INCREASED PSYCHOSOCIAL STRESSORS.  Stable.    HEALTH MAINTENANCE.  Physical 02/25/13.  She is s/p hysterectomy.  GI as outlined above.  Mammogram 05/23/12 - BiRADS II.  Had f/u mammogram 03/25/13 Birads I.

## 2013-11-14 NOTE — Progress Notes (Signed)
Pre visit review using our clinic review tool, if applicable. No additional management support is needed unless otherwise documented below in the visit note. 

## 2013-11-18 ENCOUNTER — Encounter: Payer: Self-pay | Admitting: Internal Medicine

## 2013-11-18 NOTE — Assessment & Plan Note (Signed)
Follow lipid panel.  Continue low cholesterol diet, exercise and current med regimen.  Recent cholesterol check with LDL 79.    

## 2013-11-18 NOTE — Assessment & Plan Note (Signed)
Blood pressure under good control.  Same medication regimen.  Follow.   

## 2013-11-18 NOTE — Assessment & Plan Note (Signed)
Continue iron supplements.  States has been taking regularly.   Has had extensive GI w/up as outlined.   Follow cbc/ferritin.  Recent hgb 11.1 (improved).    

## 2013-11-18 NOTE — Assessment & Plan Note (Signed)
Continue CPAP.  

## 2013-11-18 NOTE — Assessment & Plan Note (Signed)
A1c just checked 11/11/13 - 7.5.  Discussed the importance of diet and exercise.  Keep up to date with eye checks.  On metformin and amaryl.  Sugars as outlined.   Intolerant to [facility].  Change to januvia 135m q day.  Follow sugars.  Follow met b and a1c.

## 2013-11-18 NOTE — Assessment & Plan Note (Signed)
Recently found to have minimal disease on cath.  Aggressive risk factor modification.  Currently asymptomatic.  Follow.   

## 2013-12-03 ENCOUNTER — Other Ambulatory Visit: Payer: Self-pay | Admitting: Internal Medicine

## 2013-12-03 ENCOUNTER — Ambulatory Visit (INDEPENDENT_AMBULATORY_CARE_PROVIDER_SITE_OTHER): Payer: BC Managed Care – PPO | Admitting: Internal Medicine

## 2013-12-03 ENCOUNTER — Encounter: Payer: Self-pay | Admitting: Internal Medicine

## 2013-12-03 VITALS — BP 130/80 | HR 103 | Temp 97.9°F | Wt 238.2 lb

## 2013-12-03 DIAGNOSIS — N3000 Acute cystitis without hematuria: Secondary | ICD-10-CM

## 2013-12-03 DIAGNOSIS — N390 Urinary tract infection, site not specified: Secondary | ICD-10-CM

## 2013-12-03 LAB — POCT URINALYSIS DIPSTICK
Bilirubin, UA: NEGATIVE
Glucose, UA: 100
Nitrite, UA: POSITIVE
PH UA: 5
PROTEIN UA: 30
SPEC GRAV UA: 1.01
Urobilinogen, UA: 1

## 2013-12-03 MED ORDER — CIPROFLOXACIN HCL 500 MG PO TABS: 500.0000 mg | ORAL_TABLET | Freq: Two times a day (BID) | ORAL | Status: DC

## 2013-12-03 NOTE — Telephone Encounter (Signed)
Spoke with patient, appt scheduled today at 1:15 per Dr. Lorin Picket

## 2013-12-03 NOTE — Progress Notes (Signed)
Pre visit review using our clinic review tool, if applicable. No additional management support is needed unless otherwise documented below in the visit note. 

## 2013-12-06 ENCOUNTER — Encounter: Payer: Self-pay | Admitting: *Deleted

## 2013-12-06 ENCOUNTER — Other Ambulatory Visit: Payer: Self-pay | Admitting: Internal Medicine

## 2013-12-06 ENCOUNTER — Encounter: Payer: Self-pay | Admitting: Internal Medicine

## 2013-12-06 DIAGNOSIS — R319 Hematuria, unspecified: Secondary | ICD-10-CM

## 2013-12-06 LAB — URINE CULTURE: Colony Count: >=100000

## 2013-12-06 NOTE — Progress Notes (Signed)
Order placed for f/u urinalysis 

## 2013-12-08 ENCOUNTER — Encounter: Payer: Self-pay | Admitting: Internal Medicine

## 2013-12-08 DIAGNOSIS — N390 Urinary tract infection, site not specified: Secondary | ICD-10-CM | POA: Insufficient documentation

## 2013-12-08 NOTE — Progress Notes (Signed)
Subjective:    Patient ID: Evelyn James, female    DOB: Feb 26, 1961, 53 y.o.   MRN: [id number]  Urinary Tract Infection   53 year old female with past history of diabetes, hypercholesterolemia, hypertension and reoccurring allergy problems who comes in today as a work in with concerns regarding a possible urinary tract infection.  Was recently treated for sinus infection with augmentin.  Completed abx 11/29/13.  Yesterday am noticed some discomfort with end urination.  Started drinking more water.  Has noticed dysuria, urgency and increased frequency.  Abdominal pressure.  Back aching.  Took AZO.  No hematuria.  Eating and drinking well.  Previously had vaginal irritation.  Took monistart.  Better.  No vaginal problems now.       Past Medical History  Diagnosis Date  . Diabetes mellitus   . Hypertension   . Hypercholesterolemia   . Reflux   . TMJ dysfunction   . Anemia     Noted 11/2011  . Chest pain     Admitted 11/2011: cath with mild nonobstructive coronary plaque, normal EF, negative cardiac enzymes and negative d-dimer.    Current Outpatient Prescriptions on File Prior to Visit  Medication Sig Dispense Refill  . BAYER CONTOUR TEST test strip CHECK BLOOD SUGAR TWICE A DAY  100 each  3  . cetirizine (ZYRTEC) 10 MG tablet Take 10 mg by mouth daily.      . cyanocobalamin (,VITAMIN B-12,) 1000 MCG/ML injection INJECT INTO THIGH ONCE A WEEK X 3 WEEKS, THEN ONCE A MONTH  3 mL  3  . cyclobenzaprine (FLEXERIL) 10 MG tablet 1/2 - 1 tablet q pm prn  30 tablet  0  . escitalopram (LEXAPRO) 10 MG tablet TAKE 2 TABLETS BY MOUTH EVERY DAY  60 tablet  5  . Fe Fum-FePoly-Vit C-Vit B3 (INTEGRA) 62.5-62.5-40-3 MG CAPS TAKE ONE CAPSULE BY MOUTH TWICE A DAY  60 capsule  5  . fluticasone (FLONASE) 50 MCG/ACT nasal spray USE 2 SPRAYS IN EACH NOSTRIL EVERY DAY  16 g  3  . lisinopril (PRINIVIL,ZESTRIL) 20 MG tablet TAKE 1 TABLET BY MOUTH EVERY DAY  90 tablet  1  . naproxen sodium (ANAPROX) 220 MG  tablet Take 220 mg by mouth daily as needed. For pain      . pantoprazole (PROTONIX) 40 MG tablet TAKE 1 TABLET BY MOUTH EVERY DAY  90 tablet  1  . pravastatin (PRAVACHOL) 40 MG tablet TAKE 1 TABLET (40 MG TOTAL) BY MOUTH DAILY.  90 tablet  1  . PREMARIN 0.45 MG tablet TAKE 1 TABLET BY MOUTH EVERY DAY  30 tablet  5  . sitaGLIPtin (JANUVIA) 100 MG tablet Take 1 tablet (100 mg total) by mouth daily.  30 tablet  2   No current facility-administered medications on file prior to visit.    Review of Systems No fever.   No abdominal pain or cramping. Some abdominal pressure.   No bowel change, such as diarrhea, constipation, BRBPR or melana.  Increased urinary frequency, dysuria and abdominal pressure.  No hematuria.  No vaginal symptoms.  Took AZO.  Sinus symptoms better.  Eating and drinking well.        Objective:   Physical Exam  Filed Vitals:   12/03/13 1322  BP: 130/80  Pulse: 103  Temp: 97.9 F (21.45 C)   53 year old female in no acute distress.   HEENT:  Nares- clear.  Oropharynx - without lesions. NECK:  Supple.  Nontender.  HEART:  Appears to be regular. LUNGS:  No crackles or wheezing audible.  Respirations even and unlabored. ABDOMEN:  Soft, nontender.  Bowel sounds present and normal.  No audible abdominal bruit.    BACK:  Non tender.  No CVA tenderness.          Assessment & Plan:  GI.  Colonoscopy 05/04/10 negative for colitis.  Two adenomas removed.  EGD revealed gastritis.  Bx negative for sprue, Barretts and H. Pylori.  Saw Dr Katrinka Blazing.  Had abdominal ultrasound, HIDA (normal function) and MRI liver - normal.  Had hernia repair 05/18/10.   On Protonix.  Was having pm nausea.  Protonix increased to bid.  Nausea better.  Saw GI.  Had colonoscopy 05/20/13 - diverticulosis.          GYN.  S/p hysterectomy and BSO.  Currently asymptomatic.    HEALTH MAINTENANCE.  Physical 02/25/13.  She is s/p hysterectomy.  GI as outlined above.  Mammogram 05/23/12 - BiRADS II.  Had f/u mammogram  03/25/13 Birads I.

## 2013-12-08 NOTE — Assessment & Plan Note (Signed)
Urine dip c/w uti.  Send for culture.  Treat with cipro as directed.  Continue staying hydrated.  Follow.  Notify me if persistent problems.

## 2013-12-09 ENCOUNTER — Other Ambulatory Visit: Payer: Self-pay | Admitting: Internal Medicine

## 2013-12-09 ENCOUNTER — Encounter: Payer: Self-pay | Admitting: Internal Medicine

## 2013-12-10 ENCOUNTER — Encounter: Payer: Self-pay | Admitting: Internal Medicine

## 2013-12-12 ENCOUNTER — Other Ambulatory Visit: Payer: Self-pay | Admitting: Internal Medicine

## 2013-12-16 ENCOUNTER — Telehealth: Payer: Self-pay | Admitting: *Deleted

## 2013-12-16 NOTE — Telephone Encounter (Signed)
Fax from [facility], Pantoprazole approved through 12/16/14. Pharmacy notified.

## 2013-12-16 NOTE — Telephone Encounter (Signed)
Fax from [facility], needing PA on Pantoprazole. Started online, pending response

## 2013-12-17 ENCOUNTER — Other Ambulatory Visit (INDEPENDENT_AMBULATORY_CARE_PROVIDER_SITE_OTHER): Payer: BC Managed Care – PPO

## 2013-12-17 ENCOUNTER — Encounter: Payer: Self-pay | Admitting: Internal Medicine

## 2013-12-17 DIAGNOSIS — R319 Hematuria, unspecified: Secondary | ICD-10-CM

## 2013-12-17 LAB — URINALYSIS, ROUTINE W REFLEX MICROSCOPIC
Bilirubin Urine: NEGATIVE
Hgb urine dipstick: NEGATIVE
KETONES UR: NEGATIVE
LEUKOCYTES UA: NEGATIVE
Nitrite: NEGATIVE
RBC / HPF: NONE SEEN (ref 0–?)
SPECIFIC GRAVITY, URINE: 1.02 (ref 1.000–1.030)
Total Protein, Urine: NEGATIVE
UROBILINOGEN UA: 0.2 (ref 0.0–1.0)
Urine Glucose: NEGATIVE
pH: 6.5 (ref 5.0–8.0)

## 2013-12-23 ENCOUNTER — Encounter: Payer: Self-pay | Admitting: Internal Medicine

## 2013-12-28 ENCOUNTER — Other Ambulatory Visit: Payer: Self-pay | Admitting: Internal Medicine

## 2013-12-29 ENCOUNTER — Encounter: Payer: Self-pay | Admitting: Internal Medicine

## 2014-01-01 ENCOUNTER — Ambulatory Visit (INDEPENDENT_AMBULATORY_CARE_PROVIDER_SITE_OTHER): Payer: BC Managed Care – PPO | Admitting: Internal Medicine

## 2014-01-01 ENCOUNTER — Encounter: Payer: Self-pay | Admitting: Internal Medicine

## 2014-01-01 VITALS — BP 100/70 | HR 86 | Temp 98.1°F | Ht 64.0 in | Wt 236.8 lb

## 2014-01-01 DIAGNOSIS — G4730 Sleep apnea, unspecified: Secondary | ICD-10-CM

## 2014-01-01 DIAGNOSIS — E669 Obesity, unspecified: Secondary | ICD-10-CM | POA: Insufficient documentation

## 2014-01-01 DIAGNOSIS — E7800 Pure hypercholesterolemia, unspecified: Secondary | ICD-10-CM

## 2014-01-01 DIAGNOSIS — Z91048 Other nonmedicinal substance allergy status: Secondary | ICD-10-CM

## 2014-01-01 DIAGNOSIS — I2510 Atherosclerotic heart disease of native coronary artery without angina pectoris: Secondary | ICD-10-CM

## 2014-01-01 DIAGNOSIS — D649 Anemia, unspecified: Secondary | ICD-10-CM

## 2014-01-01 DIAGNOSIS — E119 Type 2 diabetes mellitus without complications: Secondary | ICD-10-CM

## 2014-01-01 DIAGNOSIS — Z9109 Other allergy status, other than to drugs and biological substances: Secondary | ICD-10-CM

## 2014-01-01 DIAGNOSIS — I10 Essential (primary) hypertension: Secondary | ICD-10-CM

## 2014-01-01 NOTE — Assessment & Plan Note (Signed)
Follow lipid panel.  Continue low cholesterol diet, exercise and current med regimen.  Recent cholesterol check with LDL 79.    

## 2014-01-01 NOTE — Progress Notes (Signed)
Subjective:    Patient ID: Evelyn James, female    DOB: [birth year], 53 y.o.   MRN: [id number]  HPI 53 year old female with past history of diabetes, hypercholesterolemia, hypertension and reoccurring allergy problems who comes in today for a scheduled follow up.  She states she has been doing relatively well.  Last A1c elevated - 7.5.   On metformin and januvia.  AM sugars averaging 130s-140s and pm sugars averaging 150s-200.   She has not been watching her diet.  She is not exercising regularly.  Has been at the beach recently.  Was not in her regular routine of eating.  No abdominal pain.  Bowels better.  No chest pain or tightness.  Breathing stable.  Has seen GI.   Plans to going a gym and start exercising more.  Is planning to get more serious about her diet.     Past Medical History  Diagnosis Date  . Diabetes mellitus   . Hypertension   . Hypercholesterolemia   . Reflux   . TMJ dysfunction   . Anemia     Noted 11/2011  . Chest pain     Admitted 11/2011: cath with mild nonobstructive coronary plaque, normal EF, negative cardiac enzymes and negative d-dimer.    Current Outpatient Prescriptions on File Prior to Visit  Medication Sig Dispense Refill  . BAYER CONTOUR TEST test strip CHECK BLOOD SUGAR TWICE A DAY  100 each  3  . cetirizine (ZYRTEC) 10 MG tablet Take 10 mg by mouth daily.      . cyanocobalamin (,VITAMIN B-12,) 1000 MCG/ML injection INJECT 1ML INTO THIGH ONCE A WEEK X 3 WEEKS, THEN ONCE A MONTH  3 mL  3  . cyclobenzaprine (FLEXERIL) 10 MG tablet 1/2 - 1 tablet q pm prn  30 tablet  0  . escitalopram (LEXAPRO) 10 MG tablet TAKE 2 TABLETS BY MOUTH EVERY DAY  60 tablet  5  . Fe Fum-FePoly-Vit C-Vit B3 (INTEGRA) 62.5-62.5-40-3 MG CAPS TAKE ONE CAPSULE BY MOUTH TWICE A DAY  60 capsule  5  . fluticasone (FLONASE) 50 MCG/ACT nasal spray USE 2 SPRAYS IN EACH NOSTRIL EVERY DAY  16 g  3  . lisinopril (PRINIVIL,ZESTRIL) 20 MG tablet TAKE 1 TABLET BY MOUTH EVERY DAY  90 tablet  1   . metFORMIN (GLUCOPHAGE) 1000 MG tablet TAKE 1 TABLET BY MOUTH TWICE A DAY WITH A MEAL  180 tablet  1  . naproxen sodium (ANAPROX) 220 MG tablet Take 220 mg by mouth daily as needed. For pain      . pantoprazole (PROTONIX) 40 MG tablet TAKE 1 TABLET BY MOUTH EVERY DAY  90 tablet  1  . pravastatin (PRAVACHOL) 40 MG tablet TAKE 1 TABLET (40 MG TOTAL) BY MOUTH DAILY.  90 tablet  1  . PREMARIN 0.45 MG tablet TAKE 1 TABLET BY MOUTH EVERY DAY  30 tablet  5  . sitaGLIPtin (JANUVIA) 100 MG tablet Take 1 tablet (100 mg total) by mouth daily.  30 tablet  2   No current facility-administered medications on file prior to visit.    Review of Systems Patient denies any headache, lightheadedness or dizziness.  Some minimal drainage.  Some cough related.  No fever.  No chest pain, tightness or palpitations.  No increased shortness of breath or chest congestion.  No vomiting.  No nausea.  No acid reflux reported.  No abdominal pain or cramping.  No bowel change, such as diarrhea, constipation, BRBPR or melana.  No  urine change.  Not exercising regularly.  Sugars as outlined.    Not watching her diet.       Objective:   Physical Exam  Filed Vitals:   01/01/14 0758  BP: 100/70  Pulse: 86  Temp: 98.1 F (36.7 C)   Blood pressure recheck:  80112/4368  53 year old female in no acute distress.   HEENT:  Nares- clear.  Slightly erythematous turbinates.  Oropharynx - without lesions. NECK:  Supple.  Nontender.  No audible bruit.  HEART:  Appears to be regular. LUNGS:  No crackles or wheezing audible.  Respirations even and unlabored.  RADIAL PULSE:  Equal bilaterally.  ABDOMEN:  Soft, nontender.  Bowel sounds present and normal.  No audible abdominal bruit.    EXTREMITIES:  No increased edema present.  DP pulses palpable and equal bilaterally.   FEET:  Without lesions.        Assessment & Plan:  GI.  Colonoscopy 05/04/10 negative for colitis.  Two adenomas removed.  EGD revealed gastritis.  Bx negative for  sprue, Barretts and H. Pylori.  Saw Dr Katrinka BlazingSmith.  Had abdominal ultrasound, HIDA (normal function) and MRI liver - normal.  Had hernia repair 05/18/10.   On Protonix.  Was having pm nausea.  Protonix increased to bid.  No nausea.  Saw GI.  Had colonoscopy 05/20/13 - diverticulosis.  Bowels better.  Takes a probiotic 2-3x/week.          GYN.  S/p hysterectomy and BSO.  Currently asymptomatic.    INCREASED PSYCHOSOCIAL STRESSORS.  Stable.    HEALTH MAINTENANCE.  Physical 02/25/13.  She is s/p hysterectomy.  GI as outlined above.  Mammogram 05/23/12 - BiRADS II.  Had f/u mammogram 03/25/13 Birads I.     Problem List Items Addressed This Visit   Anemia     Continue iron supplements.  States has been taking regularly.   Has had extensive GI w/up as outlined.   Follow cbc/ferritin.  Recent hgb 11.1 (improved).       Relevant Orders      CBC with Differential      Ferritin   CAD (coronary artery disease)     Follow lipid panel.  Continue low cholesterol diet, exercise and current med regimen.  Recent cholesterol check with LDL 79.       Diabetes mellitus     A1c lastchecked 11/11/13 - 7.5.  Discussed the importance of diet and exercise.  She does plan to get more serious about her diet.  planning to join a gym.  Is going to start exercising.  Keep up to date with eye checks.  On metformin and [place]uvia now.  Sugars as outlined.   Intolerant to [employer]aryl.  We discussed medication changes.  She wants to hold on changing at this time.  Will work on diet and exercise.  Send in readings over the next few weeks.       Relevant Orders      Hemoglobin A1c      Microalbumin / creatinine urine ratio   Environmental allergies     Some drainage today.  Flonase and saline nasal spray as directed.  Hold pneumonia vaccine until know for sure no progression.  Follow.       Hypercholesterolemia     Follow lipid panel.  Continue low cholesterol diet, exercise and current med regimen.  Recent cholesterol check with LDL 79.        Relevant Orders      Lipid panel  Hepatic function panel   Hypertension - Primary     Blood pressure is doing well.  Continue same medication regimen.  Follow.  Check metabolic panel with next labs.       Relevant Orders      Basic metabolic panel   Severe obesity (BMI >= 40)     Discussed diet, exercise and weight loss.  Follow.      Sleep apnea     Continue CPAP.

## 2014-01-01 NOTE — Assessment & Plan Note (Signed)
Blood pressure is doing well.  Continue same medication regimen.  Follow.  Check metabolic panel with next labs.

## 2014-01-01 NOTE — Assessment & Plan Note (Signed)
Follow lipid panel.  Continue low cholesterol diet, exercise and current med regimen.  Recent cholesterol check with LDL 79.

## 2014-01-01 NOTE — Assessment & Plan Note (Signed)
Continue iron supplements.  States has been taking regularly.   Has had extensive GI w/up as outlined.   Follow cbc/ferritin.  Recent hgb 11.1 (improved).

## 2014-01-01 NOTE — Patient Instructions (Signed)
Send in sugar readings over the next 3-4 weeks.

## 2014-01-01 NOTE — Assessment & Plan Note (Signed)
Some drainage today.  Flonase and saline nasal spray as directed.  Hold pneumonia vaccine until know for sure no progression.  Follow.

## 2014-01-01 NOTE — Assessment & Plan Note (Signed)
A1c lastchecked 11/11/13 - 7.5.  Discussed the importance of diet and exercise.  She does plan to get more serious about her diet.  planning to join a gym.  Is going to start exercising.  Keep up to date with eye checks.  On metformin and [place]uvia now.  Sugars as outlined.   Intolerant to [employer]aryl.  We discussed medication changes.  She wants to hold on changing at this time.  Will work on diet and exercise.  Send in readings over the next few weeks.

## 2014-01-01 NOTE — Assessment & Plan Note (Signed)
Continue CPAP.  

## 2014-01-01 NOTE — Progress Notes (Signed)
Pre visit review using our clinic review tool, if applicable. No additional management support is needed unless otherwise documented below in the visit note. 

## 2014-01-01 NOTE — Assessment & Plan Note (Signed)
Discussed diet, exercise and weight loss.  Follow.    

## 2014-01-06 ENCOUNTER — Ambulatory Visit (INDEPENDENT_AMBULATORY_CARE_PROVIDER_SITE_OTHER): Payer: BC Managed Care – PPO | Admitting: Internal Medicine

## 2014-01-06 ENCOUNTER — Encounter: Payer: Self-pay | Admitting: Internal Medicine

## 2014-01-06 VITALS — BP 130/76 | HR 96 | Temp 98.2°F | Wt 238.0 lb

## 2014-01-06 DIAGNOSIS — E119 Type 2 diabetes mellitus without complications: Secondary | ICD-10-CM

## 2014-01-06 DIAGNOSIS — R300 Dysuria: Secondary | ICD-10-CM

## 2014-01-06 DIAGNOSIS — T3695XA Adverse effect of unspecified systemic antibiotic, initial encounter: Secondary | ICD-10-CM

## 2014-01-06 DIAGNOSIS — B379 Candidiasis, unspecified: Secondary | ICD-10-CM

## 2014-01-06 DIAGNOSIS — N390 Urinary tract infection, site not specified: Secondary | ICD-10-CM

## 2014-01-06 LAB — POCT URINALYSIS DIPSTICK
Bilirubin, UA: NEGATIVE
Glucose, UA: NEGATIVE
Ketones, UA: NEGATIVE
NITRITE UA: NEGATIVE
PROTEIN UA: NEGATIVE
Spec Grav, UA: 1.015
UROBILINOGEN UA: NEGATIVE

## 2014-01-06 MED ORDER — CEPHALEXIN 500 MG PO CAPS: 500.0000 mg | ORAL_CAPSULE | Freq: Four times a day (QID) | ORAL | Status: DC

## 2014-01-06 MED ORDER — FLUCONAZOLE 150 MG PO TABS: 150.0000 mg | ORAL_TABLET | Freq: Once | ORAL | Status: DC

## 2014-01-06 NOTE — Progress Notes (Signed)
HPI  Pt presents to the clinic today with c/o urgency, frequency, dysuria and abdominal pressure. She reports this started 2 days ago. She denies fever, chills, nausea but has had some low back pain. She does get frequent UTI's. Last one 11/2013 treated with Cipro. She does have a history of DM 2, with A1C of 7.5 10/2013- likely a contributing factor. She has taken Uristat OTC with some relief. She denies vaginal complaints.   Review of Systems  Past Medical History  Diagnosis Date  . Diabetes mellitus   . Hypertension   . Hypercholesterolemia   . Reflux   . TMJ dysfunction   . Anemia     Noted 11/2011  . Chest pain     Admitted 11/2011: cath with mild nonobstructive coronary plaque, normal EF, negative cardiac enzymes and negative d-dimer.    Family History  Problem Relation Age of Onset  . Heart disease Father     Father had CABG in his 3560s  . Lung cancer Mother   . Colon polyps Father   . Breast cancer Paternal Grandmother   . Arthritis/Rheumatoid Paternal Grandfather     History   Social History  . Marital Status: Divorced    Spouse Name: N/A    Number of Children: 2  . Years of Education: N/A   Occupational History  . Not on file.   Social History Main Topics  . Smoking status: Never Smoker   . Smokeless tobacco: Never Used  . Alcohol Use: No  . Drug Use: No  . Sexual Activity: Not on file   Other Topics Concern  . Not on file   Social History Narrative  . No narrative on file    Allergies  Allergen Reactions  . Sulfa Antibiotics Other (See Comments)    Topical--red itchy; oral--makes mouth raw    Constitutional: Denies fever, malaise, fatigue, headache or abrupt weight changes.   GU: Pt reports urgency, frequency and pain with urination. Denies burning sensation, blood in urine, odor or discharge. Skin: Denies redness, rashes, lesions or ulcercations.   No other specific complaints in a complete review of systems (except as listed in HPI above).     Objective:   Physical Exam  BP 130/76  Pulse 96  Temp(Src) 98.2 F (36.8 C) (Oral)  Wt 238 lb (107.956 kg)  SpO2 98% Wt Readings from Last 3 Encounters:  01/06/14 238 lb (107.956 kg)  01/01/14 236 lb 12 oz (107.389 kg)  12/03/13 238 lb 4 oz (108.069 kg)    General: Appears her stated age, obese but well developed, well nourished in NAD. Cardiovascular: Normal rate and rhythm. S1,S2 noted.  No murmur, rubs or gallops noted.  Pulmonary/Chest: Normal effort and positive vesicular breath sounds. No respiratory distress. No wheezes, rales or ronchi noted.  Abdomen: Soft. Normal bowel sounds, no bruits noted. No distention or masses noted. Liver, spleen and kidneys non palpable. Tender to palpation over the bladder area. No CVA tenderness.      Assessment & Plan:   Urgency, Frequency, Dysuria secondary to UTI  Urinalysis: small leuks, trac blood Will send urine culture eRx sent if for keflex 500 mg QID x 5 days (recent Cipro and allergy to sulfa) eRx for Diflucan for antibiotic induced yeast infection OK to take AZO OTC Drink plenty of fluids  RTC as needed or if symptoms persist.

## 2014-01-06 NOTE — Patient Instructions (Addendum)

## 2014-01-07 ENCOUNTER — Encounter: Payer: Self-pay | Admitting: Internal Medicine

## 2014-01-07 NOTE — Assessment & Plan Note (Signed)
Last A1C reviewed Continue Metformin and Januvia at this time Reinforced the importance of diet and exercise

## 2014-01-08 ENCOUNTER — Other Ambulatory Visit: Payer: Self-pay | Admitting: Internal Medicine

## 2014-01-20 ENCOUNTER — Ambulatory Visit (INDEPENDENT_AMBULATORY_CARE_PROVIDER_SITE_OTHER): Payer: BC Managed Care – PPO | Admitting: Internal Medicine

## 2014-01-20 ENCOUNTER — Encounter: Payer: Self-pay | Admitting: Internal Medicine

## 2014-01-20 VITALS — BP 138/82 | HR 89 | Temp 98.0°F | Wt 236.0 lb

## 2014-01-20 DIAGNOSIS — N3289 Other specified disorders of bladder: Secondary | ICD-10-CM

## 2014-01-20 DIAGNOSIS — R350 Frequency of micturition: Secondary | ICD-10-CM

## 2014-01-20 DIAGNOSIS — R3989 Other symptoms and signs involving the genitourinary system: Secondary | ICD-10-CM

## 2014-01-20 LAB — POCT URINALYSIS DIPSTICK
Bilirubin, UA: NEGATIVE
Blood, UA: NEGATIVE
Glucose, UA: NEGATIVE
Ketones, UA: NEGATIVE
LEUKOCYTES UA: NEGATIVE
NITRITE UA: NEGATIVE
PH UA: 6
Protein, UA: NEGATIVE
Spec Grav, UA: 1.015
Urobilinogen, UA: NEGATIVE

## 2014-01-20 NOTE — Addendum Note (Signed)
Addended by: Roena MaladyEVONTENNO, Duff Pozzi Y on: 01/20/2014 04:52 PM   Modules accepted: Orders

## 2014-01-20 NOTE — Patient Instructions (Signed)

## 2014-01-20 NOTE — Progress Notes (Signed)
HPI  Pt presents to the clinic today with c/o frequency, bloating and a sensation of pressure in her bladder. She reports this started 2 days ago. She denies fever, chills, nausea or low back pain. She has tried increasing her fluids and taking AZO without much relief. She was seen 10/26 for the same. Diagnosed with a UTI which was treated with Keflex. Urine culture was supposed to be sent but no result in the EMR.   Review of Systems  Past Medical History  Diagnosis Date  . Diabetes mellitus   . Hypertension   . Hypercholesterolemia   . Reflux   . TMJ dysfunction   . Anemia     Noted 11/2011  . Chest pain     Admitted 11/2011: cath with mild nonobstructive coronary plaque, normal EF, negative cardiac enzymes and negative d-dimer.    Family History  Problem Relation Age of Onset  . Heart disease Father     Father had CABG in his 8460s  . Lung cancer Mother   . Colon polyps Father   . Breast cancer Paternal Grandmother   . Arthritis/Rheumatoid Paternal Grandfather     History   Social History  . Marital Status: Divorced    Spouse Name: N/A    Number of Children: 2  . Years of Education: N/A   Occupational History  . Not on file.   Social History Main Topics  . Smoking status: Never Smoker   . Smokeless tobacco: Never Used  . Alcohol Use: No  . Drug Use: No  . Sexual Activity: Not on file   Other Topics Concern  . Not on file   Social History Narrative    Allergies  Allergen Reactions  . Sulfa Antibiotics Other (See Comments)    Topical--red itchy; oral--makes mouth raw    Constitutional: Denies fever, malaise, fatigue, headache or abrupt weight changes.   GU: Pt reports urgency, frequency and sensation of bladder pressure. Denies burning sensation, blood in urine, odor or discharge. Skin: Denies redness, rashes, lesions or ulcercations.   No other specific complaints in a complete review of systems (except as listed in HPI above).    Objective:   Physical Exam  BP 138/82 mmHg  Pulse 89  Temp(Src) 98 F (36.7 C) (Oral)  Wt 236 lb (107.049 kg)  SpO2 98% Wt Readings from Last 3 Encounters:  01/20/14 236 lb (107.049 kg)  01/06/14 238 lb (107.956 kg)  01/01/14 236 lb 12 oz (107.389 kg)    General: Appears her stated age, obese but well developed, well nourished in NAD. Cardiovascular: Normal rate and rhythm. S1,S2 noted.  No murmur, rubs or gallops noted.  Pulmonary/Chest: Normal effort and positive vesicular breath sounds. No respiratory distress. No wheezes, rales or ronchi noted.  Abdomen: Soft. Normal bowel sounds, no bruits noted. No distention or masses noted. Liver, spleen and kidneys non palpable. Tender to palpation over the bladder area. No CVA tenderness.      Assessment & Plan:   Urgency, Frequency, Bladder Pressure:  ? cystitis Urinalysis: normal OK to take AZO OTC Drink plenty of fluids  RTC as needed or if symptoms persist.

## 2014-02-05 ENCOUNTER — Encounter: Payer: Self-pay | Admitting: Internal Medicine

## 2014-02-05 ENCOUNTER — Ambulatory Visit (INDEPENDENT_AMBULATORY_CARE_PROVIDER_SITE_OTHER): Payer: BC Managed Care – PPO | Admitting: Internal Medicine

## 2014-02-05 VITALS — BP 138/72 | HR 90 | Temp 97.6°F | Wt 238.0 lb

## 2014-02-05 DIAGNOSIS — R300 Dysuria: Secondary | ICD-10-CM

## 2014-02-05 LAB — POCT URINALYSIS DIPSTICK
BILIRUBIN UA: NEGATIVE
Glucose, UA: NEGATIVE
Ketones, UA: NEGATIVE
LEUKOCYTES UA: NEGATIVE
NITRITE UA: NEGATIVE
Protein, UA: NEGATIVE
RBC UA: NEGATIVE
Spec Grav, UA: 1.015
UROBILINOGEN UA: 0.2
pH, UA: 6.5

## 2014-02-05 MED ORDER — CIPROFLOXACIN HCL 500 MG PO TABS: 500.0000 mg | ORAL_TABLET | Freq: Two times a day (BID) | ORAL | Status: DC

## 2014-02-05 NOTE — Patient Instructions (Signed)
We will call or email with culture results.  Start Cipro 500mg  twice daily.  Continue increased fluid intake and Azo as needed.  Follow up immediately if any worsening pain, fever, flank pain or other concerns.

## 2014-02-05 NOTE — Progress Notes (Signed)
Pre visit review using our clinic review tool, if applicable. No additional management support is needed unless otherwise documented below in the visit note. 

## 2014-02-05 NOTE — Assessment & Plan Note (Signed)
Recent symptoms of dysuria and urgency most consistent with UTI. However UA is negative. We discussed that elevated BG may also contribute to urinary frequency, however from her report, BG are improved recently. Will start empiric Cipro and send urine for culture. Continue increased fluid intake and Azo as needed. Follow up if symptoms are not improving.

## 2014-02-05 NOTE — Progress Notes (Signed)
   Subjective:    Patient ID: Talmadge CoventryKathy W Roselani Grajeda, female    DOB: Apr 30, 1960, 53 y.o.   MRN: [id number]  HPI 53YO female presents for acute visit.  Started yesterday with increased urinary frequency and burning with urination. Symptoms are typical of previous UTI. No fever, chills. Feels slightly nauseous. Took Azo yesterday with some improvement.  DM - BG have been higher recently with some over 200, but mostly near 170. Started at a local gym and BG improved when exercising.  Review of Systems  Constitutional: Negative for fever, chills and fatigue.  Gastrointestinal: Negative for nausea, vomiting, abdominal pain, diarrhea, constipation and rectal pain.  Genitourinary: Positive for dysuria, urgency and frequency. Negative for hematuria, flank pain, decreased urine volume, vaginal bleeding, vaginal discharge, difficulty urinating, vaginal pain and pelvic pain.       Objective:    BP 138/72 mmHg  Pulse 90  Temp(Src) 97.6 F (36.4 C) (Oral)  Wt 238 lb (107.956 kg)  SpO2 96% Physical Exam  Constitutional: She is oriented to person, place, and time. She appears well-developed and well-nourished. No distress.  HENT:  Head: Normocephalic and atraumatic.  Right Ear: External ear normal.  Left Ear: External ear normal.  Nose: Nose normal.  Mouth/Throat: Oropharynx is clear and moist.  Eyes: Conjunctivae are normal. Pupils are equal, round, and reactive to light. Right eye exhibits no discharge. Left eye exhibits no discharge. No scleral icterus.  Neck: Normal range of motion. Neck supple. No tracheal deviation present. No thyromegaly present.  Cardiovascular: Normal rate, regular rhythm, normal heart sounds and intact distal pulses.  Exam reveals no gallop and no friction rub.   No murmur heard. Pulmonary/Chest: Effort normal and breath sounds normal. No accessory muscle usage. No tachypnea. No respiratory distress. She has no decreased breath sounds. She has no wheezes. She has no rhonchi.  She has no rales. She exhibits no tenderness.  Abdominal: There is no tenderness (no CVA tenderness).  Musculoskeletal: Normal range of motion. She exhibits no edema or tenderness.  Lymphadenopathy:    She has no cervical adenopathy.  Neurological: She is alert and oriented to person, place, and time. No cranial nerve deficit. She exhibits normal muscle tone. Coordination normal.  Skin: Skin is warm and dry. No rash noted. She is not diaphoretic. No erythema. No pallor.  Psychiatric: She has a normal mood and affect. Her behavior is normal. Judgment and thought content normal.          Assessment & Plan:   Problem List Items Addressed This Visit      Unprioritized   Dysuria - Primary    Recent symptoms of dysuria and urgency most consistent with UTI. However UA is negative. We discussed that elevated BG may also contribute to urinary frequency, however from her report, BG are improved recently. Will start empiric Cipro and send urine for culture. Continue increased fluid intake and Azo as needed. Follow up if symptoms are not improving.    Relevant Medications      ciprofloxacin (CIPRO) tablet   Other Relevant Orders      POCT Urinalysis Dipstick (Completed)      CULTURE, URINE COMPREHENSIVE       Return if symptoms worsen or fail to improve.

## 2014-02-08 LAB — CULTURE, URINE COMPREHENSIVE: Colony Count: 60000

## 2014-02-20 ENCOUNTER — Encounter (HOSPITAL_COMMUNITY): Payer: Self-pay | Admitting: Cardiology

## 2014-02-27 ENCOUNTER — Other Ambulatory Visit: Payer: Self-pay | Admitting: Internal Medicine

## 2014-03-12 ENCOUNTER — Encounter: Payer: Self-pay | Admitting: Internal Medicine

## 2014-03-12 ENCOUNTER — Other Ambulatory Visit (INDEPENDENT_AMBULATORY_CARE_PROVIDER_SITE_OTHER): Payer: BC Managed Care – PPO

## 2014-03-12 DIAGNOSIS — I10 Essential (primary) hypertension: Secondary | ICD-10-CM

## 2014-03-12 DIAGNOSIS — E7800 Pure hypercholesterolemia, unspecified: Secondary | ICD-10-CM

## 2014-03-12 DIAGNOSIS — D649 Anemia, unspecified: Secondary | ICD-10-CM

## 2014-03-12 DIAGNOSIS — E119 Type 2 diabetes mellitus without complications: Secondary | ICD-10-CM

## 2014-03-12 LAB — HEPATIC FUNCTION PANEL
ALT: 31 U/L (ref 0–35)
AST: 29 U/L (ref 0–37)
Albumin: 3.7 g/dL (ref 3.5–5.2)
Alkaline Phosphatase: 64 U/L (ref 39–117)
BILIRUBIN DIRECT: 0 mg/dL (ref 0.0–0.3)
TOTAL PROTEIN: 6.9 g/dL (ref 6.0–8.3)
Total Bilirubin: 0.3 mg/dL (ref 0.2–1.2)

## 2014-03-12 LAB — CBC WITH DIFFERENTIAL/PLATELET
Basophils Absolute: 0 10*3/uL (ref 0.0–0.1)
Basophils Relative: 0.5 % (ref 0.0–3.0)
EOS ABS: 0.3 10*3/uL (ref 0.0–0.7)
EOS PCT: 3.2 % (ref 0.0–5.0)
HCT: 36.5 % (ref 36.0–46.0)
Hemoglobin: 11.6 g/dL — ABNORMAL LOW (ref 12.0–15.0)
Lymphocytes Relative: 29.4 % (ref 12.0–46.0)
Lymphs Abs: 2.7 10*3/uL (ref 0.7–4.0)
MCHC: 31.7 g/dL (ref 30.0–36.0)
MCV: 84.1 fl (ref 78.0–100.0)
Monocytes Absolute: 0.6 10*3/uL (ref 0.1–1.0)
Monocytes Relative: 6.1 % (ref 3.0–12.0)
NEUTROS ABS: 5.6 10*3/uL (ref 1.4–7.7)
Neutrophils Relative %: 60.8 % (ref 43.0–77.0)
Platelets: 223 10*3/uL (ref 150.0–400.0)
RBC: 4.34 Mil/uL (ref 3.87–5.11)
RDW: 15.2 % (ref 11.5–15.5)
WBC: 9.2 10*3/uL (ref 4.0–10.5)

## 2014-03-12 LAB — LIPID PANEL
CHOLESTEROL: 165 mg/dL (ref 0–200)
HDL: 54.7 mg/dL (ref >39.00)
LDL Cholesterol: 71 mg/dL (ref 0–99)
NONHDL: 110.3
Total CHOL/HDL Ratio: 3
Triglycerides: 198 mg/dL — ABNORMAL HIGH (ref 0.0–149.0)
VLDL: 39.6 mg/dL (ref 0.0–40.0)

## 2014-03-12 LAB — HEMOGLOBIN A1C: Hgb A1c MFr Bld: 7.5 % — ABNORMAL HIGH (ref 4.6–6.5)

## 2014-03-12 LAB — BASIC METABOLIC PANEL
BUN: 14 mg/dL (ref 6–23)
CALCIUM: 9.1 mg/dL (ref 8.4–10.5)
CO2: 28 meq/L (ref 19–32)
Chloride: 104 mEq/L (ref 96–112)
Creatinine, Ser: 0.6 mg/dL (ref 0.4–1.2)
GFR: 117.03 mL/min (ref >60.00)
Glucose, Bld: 132 mg/dL — ABNORMAL HIGH (ref 70–99)
Potassium: 3.9 mEq/L (ref 3.5–5.1)
SODIUM: 140 meq/L (ref 135–145)

## 2014-03-12 LAB — MICROALBUMIN / CREATININE URINE RATIO
Creatinine,U: 139.9 mg/dL
Microalb Creat Ratio: 1.4 mg/g (ref 0.0–30.0)
Microalb, Ur: 1.9 mg/dL (ref 0.0–1.9)

## 2014-03-12 LAB — FERRITIN: Ferritin: 25.7 ng/mL (ref 10.0–291.0)

## 2014-03-18 ENCOUNTER — Encounter: Payer: BC Managed Care – PPO | Admitting: Internal Medicine

## 2014-03-31 ENCOUNTER — Ambulatory Visit (INDEPENDENT_AMBULATORY_CARE_PROVIDER_SITE_OTHER): Payer: BC Managed Care – PPO | Admitting: Internal Medicine

## 2014-03-31 ENCOUNTER — Encounter: Payer: Self-pay | Admitting: Internal Medicine

## 2014-03-31 ENCOUNTER — Other Ambulatory Visit (HOSPITAL_COMMUNITY)
Admission: RE | Admit: 2014-03-31 | Discharge: 2014-03-31 | Disposition: A | Discharge status: Home / Self Care | Payer: BC Managed Care – PPO | Source: Ambulatory Visit | Attending: Internal Medicine | Admitting: Internal Medicine

## 2014-03-31 VITALS — BP 124/80 | HR 88 | Temp 98.1°F | Ht 63.75 in | Wt 238.0 lb

## 2014-03-31 DIAGNOSIS — G4730 Sleep apnea, unspecified: Secondary | ICD-10-CM

## 2014-03-31 DIAGNOSIS — I10 Essential (primary) hypertension: Secondary | ICD-10-CM

## 2014-03-31 DIAGNOSIS — Z1239 Encounter for other screening for malignant neoplasm of breast: Secondary | ICD-10-CM

## 2014-03-31 DIAGNOSIS — Z1151 Encounter for screening for human papillomavirus (HPV): Secondary | ICD-10-CM | POA: Diagnosis present

## 2014-03-31 DIAGNOSIS — Z124 Encounter for screening for malignant neoplasm of cervix: Secondary | ICD-10-CM

## 2014-03-31 DIAGNOSIS — Z01419 Encounter for gynecological examination (general) (routine) without abnormal findings: Secondary | ICD-10-CM | POA: Insufficient documentation

## 2014-03-31 DIAGNOSIS — E7800 Pure hypercholesterolemia, unspecified: Secondary | ICD-10-CM

## 2014-03-31 DIAGNOSIS — I25118 Atherosclerotic heart disease of native coronary artery with other forms of angina pectoris: Secondary | ICD-10-CM

## 2014-03-31 DIAGNOSIS — E119 Type 2 diabetes mellitus without complications: Secondary | ICD-10-CM

## 2014-03-31 DIAGNOSIS — D649 Anemia, unspecified: Secondary | ICD-10-CM

## 2014-03-31 NOTE — Progress Notes (Signed)
Pre visit review using our clinic review tool, if applicable. No additional management support is needed unless otherwise documented below in the visit note. 

## 2014-04-01 ENCOUNTER — Encounter: Payer: Self-pay | Admitting: Internal Medicine

## 2014-04-01 LAB — CYTOLOGY - PAP

## 2014-04-01 NOTE — Progress Notes (Signed)
Subjective:    Patient ID: Evelyn James, female    DOB: 28-Jul-1960, 54 y.o.   MRN: [id number]  HPI 54 year old female with past history of diabetes, hypercholesterolemia, hypertension and reoccurring allergy problems who comes in today to follow up on these issues as well as for a complete physical exam.   She states she has been doing relatively well.   A1c just checked - elevated - 7.5.   On metformin and januvia.  AM sugars averaging 130s-140s and pm sugars averaging 170- 190   Brought in no recorded sugar readings.  She has not been watching her diet.  She is not exercising regularly.  Has had increased stress recently - caring for her father.  No abdominal pain.  No bowel issues reported.  No chest pain or tightness.  Breathing stable.  Has seen GI.   Plans to start exercising more.  Is planning to get more serious about her diet.  We discussed additional medication for her diabetes.  She is reluctant.  States she wants to try to get her sugars down with diet and exercise.     Past Medical History  Diagnosis Date  . Diabetes mellitus   . Hypertension   . Hypercholesterolemia   . Reflux   . TMJ dysfunction   . Anemia     Noted 11/2011  . Chest pain     Admitted 11/2011: cath with mild nonobstructive coronary plaque, normal EF, negative cardiac enzymes and negative d-dimer.    Current Outpatient Prescriptions on File Prior to Visit  Medication Sig Dispense Refill  . BAYER CONTOUR TEST test strip CHECK BLOOD SUGAR TWICE A DAY 100 each 3  . cetirizine (ZYRTEC) 10 MG tablet Take 10 mg by mouth daily.    . cyanocobalamin (,VITAMIN B-12,) 1000 MCG/ML injection INJECT INTO THIGH ONCE A WEEK X 3 WEEKS, THEN ONCE A MONTH 3 mL 3  . cyclobenzaprine (FLEXERIL) 10 MG tablet 1/2 - 1 tablet q pm prn 30 tablet 0  . escitalopram (LEXAPRO) 10 MG tablet TAKE 2 TABLETS BY MOUTH EVERY DAY 60 tablet 2  . Fe Fum-FePoly-Vit C-Vit B3 (INTEGRA) 62.5-62.5-40-3 MG CAPS TAKE ONE CAPSULE BY MOUTH TWICE A DAY  60 capsule 5  . fluticasone (FLONASE) 50 MCG/ACT nasal spray USE 2 SPRAYS IN EACH NOSTRIL EVERY DAY 16 g 3  . lisinopril (PRINIVIL,ZESTRIL) 20 MG tablet TAKE 1 TABLET BY MOUTH EVERY DAY 90 tablet 1  . metFORMIN (GLUCOPHAGE) 1000 MG tablet TAKE 1 TABLET BY MOUTH TWICE A DAY WITH A MEAL 180 tablet 1  . naproxen sodium (ANAPROX) 220 MG tablet Take 220 mg by mouth daily as needed. For pain    . pantoprazole (PROTONIX) 40 MG tablet TAKE 1 TABLET BY MOUTH EVERY DAY 90 tablet 1  . pravastatin (PRAVACHOL) 40 MG tablet TAKE 1 TABLET (40 MG TOTAL) BY MOUTH DAILY. 90 tablet 1  . PREMARIN 0.45 MG tablet TAKE 1 TABLET BY MOUTH EVERY DAY 30 tablet 5  . sitaGLIPtin (JANUVIA) 100 MG tablet Take 1 tablet (100 mg total) by mouth daily. 30 tablet 2   No current facility-administered medications on file prior to visit.    Review of Systems Patient denies any headache, lightheadedness or dizziness.  No increased sinus congestion or drainage.  No chest pain, tightness or palpitations.  No increased shortness of breath or chest congestion.  No vomiting.  No nausea.  No acid reflux reported.  No abdominal pain or cramping.  No bowel change,  such as diarrhea, constipation, BRBPR or melana.  No urine change.  Not exercising regularly.  Sugars as outlined.   Not watching her diet.   Increased stress.  Is better.       Objective:   Physical Exam  Filed Vitals:   03/31/14 1207  BP: 124/80  Pulse: 88  Temp: 98.1 F (3936.487 C)   54 year old female in no acute distress.   HEENT:  Nares- clear.  Oropharynx - without lesions. NECK:  Supple.  Nontender.  No audible bruit.  HEART:  Appears to be regular. LUNGS:  No crackles or wheezing audible.  Respirations even and unlabored.  RADIAL PULSE:  Equal bilaterally.    BREASTS:  No nipple discharge or nipple retraction present.  Could not appreciate any distinct nodules or axillary adenopathy.  ABDOMEN:  Soft, nontender.  Bowel sounds present and normal.  No audible  abdominal bruit.  GU:  Normal external genitalia.  Vaginal vault without lesions.  Cervix identified.  Pap performed. Could not appreciate any adnexal masses or tenderness.   RECTAL:  Heme negative.   EXTREMITIES:  No increased edema present.  DP pulses palpable and equal bilaterally.      FEET:  Without lesions.        Assessment & Plan:  1. Breast cancer screening - MM DIGITAL SCREENING BILATERAL; Future  2. Pap smear for cervical cancer screening - Cytology - PAP  3. Type 2 diabetes mellitus without complication Sugars as outlined.  A1c just checked - 7.5.  Discussed other treatment options.  She wants to work on diet and exercise before adding additional medication.  Will check and record her sugars bid and send in over the next 3-4 weeks.  Will adjust medication if persistent elevation.    4. Essential hypertension Blood pressure doing well.  Continue current medication regimen.    5. Severe obesity (BMI >= 40) Diet and exercise.    6. Anemia, unspecified anemia type Last hgb improved - 11.6.  Continue iron supplements.    7. Hypercholesterolemia Low cholesterol diet and exercise.  Continue pravastatin.  LDL 71.  Triglycerides increased.  Follow.    8. Sleep apnea Continue CPAP.    9. Coronary artery disease involving native coronary artery of native heart with other form of angina pectoris Continue aggressive risk factor modification.    10. GI.  Colonoscopy 05/04/10 negative for colitis.  Two adenomas removed.  EGD revealed gastritis.  Bx negative for sprue, Barretts and H. Pylori.  Saw Dr Katrinka BlazingSmith.  Had abdominal ultrasound, HIDA (normal function) and MRI liver - normal.  Had hernia repair 05/18/10.   On Protonix.  Was having pm nausea.  Protonix increased to bid.  No nausea.  Saw GI.  Had colonoscopy 05/20/13 - diverticulosis.  Bowels better.  Takes a probiotic 2-3x/week.          11. GYN.  S/p hysterectomy and BSO.  Currently asymptomatic.    12. INCREASED PSYCHOSOCIAL  STRESSORS.  Stable.    HEALTH MAINTENANCE.  Physical today.  She is s/p hysterectomy.  Previous abnormal pap.  PAP today.  GI as outlined above.  Mammogram 05/23/12 - BiRADS II.  Had f/u mammogram 03/25/13 Birads I.  Schedule f/u mammogram.

## 2014-04-11 ENCOUNTER — Other Ambulatory Visit: Payer: Self-pay | Admitting: *Deleted

## 2014-04-11 MED ORDER — ESTROGENS CONJUGATED 0.45 MG PO TABS: 0.4500 mg | ORAL_TABLET | Freq: Every day | ORAL | Status: DC

## 2014-04-18 ENCOUNTER — Encounter: Payer: Self-pay | Admitting: Internal Medicine

## 2014-04-29 ENCOUNTER — Encounter: Payer: Self-pay | Admitting: Internal Medicine

## 2014-05-01 ENCOUNTER — Other Ambulatory Visit: Payer: Self-pay | Admitting: *Deleted

## 2014-05-01 MED ORDER — SITAGLIPTIN PHOSPHATE 100 MG PO TABS: 100.0000 mg | ORAL_TABLET | Freq: Every day | ORAL | Status: DC

## 2014-05-06 ENCOUNTER — Ambulatory Visit: Payer: Self-pay | Admitting: Internal Medicine

## 2014-05-06 LAB — HM MAMMOGRAPHY: HM MAMMO: NEGATIVE

## 2014-05-09 ENCOUNTER — Encounter: Payer: Self-pay | Admitting: Internal Medicine

## 2014-05-23 ENCOUNTER — Other Ambulatory Visit: Payer: Self-pay | Admitting: Internal Medicine

## 2014-06-02 ENCOUNTER — Other Ambulatory Visit: Payer: Self-pay | Admitting: Internal Medicine

## 2014-06-05 ENCOUNTER — Other Ambulatory Visit: Payer: Self-pay

## 2014-06-05 MED ORDER — INTEGRA 62.5-62.5-40-3 MG PO CAPS: 1.0000 | ORAL_CAPSULE | Freq: Two times a day (BID) | ORAL | Status: DC

## 2014-06-05 NOTE — Telephone Encounter (Signed)
[facility] sent [facility]. [facility] notified [facility]. [facility]

## 2014-06-13 ENCOUNTER — Encounter: Payer: Self-pay | Admitting: Internal Medicine

## 2014-06-18 ENCOUNTER — Other Ambulatory Visit: Payer: Self-pay | Admitting: *Deleted

## 2014-06-18 MED ORDER — ESCITALOPRAM OXALATE 10 MG PO TABS: 20.0000 mg | ORAL_TABLET | Freq: Every day | ORAL | Status: DC

## 2014-06-27 ENCOUNTER — Other Ambulatory Visit (INDEPENDENT_AMBULATORY_CARE_PROVIDER_SITE_OTHER): Payer: BC Managed Care – PPO

## 2014-06-27 ENCOUNTER — Other Ambulatory Visit: Payer: Self-pay | Admitting: *Deleted

## 2014-06-27 DIAGNOSIS — E7800 Pure hypercholesterolemia, unspecified: Secondary | ICD-10-CM

## 2014-06-27 DIAGNOSIS — D649 Anemia, unspecified: Secondary | ICD-10-CM | POA: Diagnosis not present

## 2014-06-27 DIAGNOSIS — E119 Type 2 diabetes mellitus without complications: Secondary | ICD-10-CM | POA: Diagnosis not present

## 2014-06-27 LAB — LIPID PANEL
Cholesterol: 157 mg/dL (ref 0–200)
HDL: 54.8 mg/dL (ref >39.00)
LDL Cholesterol: 64 mg/dL (ref 0–99)
NonHDL: 102.2
TRIGLYCERIDES: 189 mg/dL — AB (ref 0.0–149.0)
Total CHOL/HDL Ratio: 3
VLDL: 37.8 mg/dL (ref 0.0–40.0)

## 2014-06-27 LAB — CBC WITH DIFFERENTIAL/PLATELET
BASOS ABS: 0 10*3/uL (ref 0.0–0.1)
Basophils Relative: 0.4 % (ref 0.0–3.0)
EOS ABS: 0.2 10*3/uL (ref 0.0–0.7)
Eosinophils Relative: 1.8 % (ref 0.0–5.0)
HEMATOCRIT: 35.3 % — AB (ref 36.0–46.0)
Hemoglobin: 11.6 g/dL — ABNORMAL LOW (ref 12.0–15.0)
LYMPHS ABS: 2.4 10*3/uL (ref 0.7–4.0)
Lymphocytes Relative: 26 % (ref 12.0–46.0)
MCHC: 32.9 g/dL (ref 30.0–36.0)
MCV: 81.6 fl (ref 78.0–100.0)
MONO ABS: 0.5 10*3/uL (ref 0.1–1.0)
Monocytes Relative: 5.2 % (ref 3.0–12.0)
Neutro Abs: 6.2 10*3/uL (ref 1.4–7.7)
Neutrophils Relative %: 66.6 % (ref 43.0–77.0)
PLATELETS: 209 10*3/uL (ref 150.0–400.0)
RBC: 4.33 Mil/uL (ref 3.87–5.11)
RDW: 15.1 % (ref 11.5–15.5)
WBC: 9.3 10*3/uL (ref 4.0–10.5)

## 2014-06-27 LAB — BASIC METABOLIC PANEL
BUN: 10 mg/dL (ref 6–23)
CO2: 29 mEq/L (ref 19–32)
Calcium: 9.4 mg/dL (ref 8.4–10.5)
Chloride: 101 mEq/L (ref 96–112)
Creatinine, Ser: 0.57 mg/dL (ref 0.40–1.20)
GFR: 116.9 mL/min (ref >60.00)
GLUCOSE: 172 mg/dL — AB (ref 70–99)
POTASSIUM: 4.3 meq/L (ref 3.5–5.1)
SODIUM: 137 meq/L (ref 135–145)

## 2014-06-27 LAB — HEPATIC FUNCTION PANEL
ALBUMIN: 3.7 g/dL (ref 3.5–5.2)
ALT: 24 U/L (ref 0–35)
AST: 23 U/L (ref 0–37)
Alkaline Phosphatase: 60 U/L (ref 39–117)
Bilirubin, Direct: 0 mg/dL (ref 0.0–0.3)
TOTAL PROTEIN: 6.7 g/dL (ref 6.0–8.3)
Total Bilirubin: 0.2 mg/dL (ref 0.2–1.2)

## 2014-06-27 LAB — FERRITIN: FERRITIN: 29.9 ng/mL (ref 10.0–291.0)

## 2014-06-27 LAB — HEMOGLOBIN A1C: Hgb A1c MFr Bld: 7.3 % — ABNORMAL HIGH (ref 4.6–6.5)

## 2014-06-27 MED ORDER — PRAVASTATIN SODIUM 40 MG PO TABS: ORAL_TABLET | ORAL | Status: DC

## 2014-06-28 ENCOUNTER — Encounter: Payer: Self-pay | Admitting: Internal Medicine

## 2014-06-30 ENCOUNTER — Ambulatory Visit (INDEPENDENT_AMBULATORY_CARE_PROVIDER_SITE_OTHER): Payer: BC Managed Care – PPO | Admitting: Internal Medicine

## 2014-06-30 ENCOUNTER — Encounter: Payer: Self-pay | Admitting: Internal Medicine

## 2014-06-30 VITALS — BP 120/80 | HR 81 | Temp 98.0°F | Ht 63.75 in | Wt 240.2 lb

## 2014-06-30 DIAGNOSIS — I10 Essential (primary) hypertension: Secondary | ICD-10-CM

## 2014-06-30 DIAGNOSIS — Z9109 Other allergy status, other than to drugs and biological substances: Secondary | ICD-10-CM

## 2014-06-30 DIAGNOSIS — G4730 Sleep apnea, unspecified: Secondary | ICD-10-CM

## 2014-06-30 DIAGNOSIS — I25118 Atherosclerotic heart disease of native coronary artery with other forms of angina pectoris: Secondary | ICD-10-CM

## 2014-06-30 DIAGNOSIS — N9489 Other specified conditions associated with female genital organs and menstrual cycle: Secondary | ICD-10-CM | POA: Diagnosis not present

## 2014-06-30 DIAGNOSIS — E119 Type 2 diabetes mellitus without complications: Secondary | ICD-10-CM

## 2014-06-30 DIAGNOSIS — Z0000 Encounter for general adult medical examination without abnormal findings: Secondary | ICD-10-CM

## 2014-06-30 DIAGNOSIS — D649 Anemia, unspecified: Secondary | ICD-10-CM

## 2014-06-30 DIAGNOSIS — E7800 Pure hypercholesterolemia, unspecified: Secondary | ICD-10-CM

## 2014-06-30 DIAGNOSIS — Z91048 Other nonmedicinal substance allergy status: Secondary | ICD-10-CM

## 2014-06-30 DIAGNOSIS — R102 Pelvic and perineal pain: Secondary | ICD-10-CM

## 2014-06-30 LAB — URINALYSIS, ROUTINE W REFLEX MICROSCOPIC
Bilirubin Urine: NEGATIVE
Ketones, ur: NEGATIVE
Leukocytes, UA: NEGATIVE
NITRITE: NEGATIVE
SPECIFIC GRAVITY, URINE: 1.02 (ref 1.000–1.030)
Total Protein, Urine: NEGATIVE
Urine Glucose: NEGATIVE
Urobilinogen, UA: 0.2 (ref 0.0–1.0)
pH: 6 (ref 5.0–8.0)

## 2014-06-30 MED ORDER — CYCLOBENZAPRINE HCL 10 MG PO TABS: ORAL_TABLET | ORAL | Status: DC

## 2014-06-30 MED ORDER — FLUTICASONE PROPIONATE 50 MCG/ACT NA SUSP: 2.0000 | Freq: Every day | NASAL | Status: AC

## 2014-06-30 NOTE — Progress Notes (Signed)
Patient ID: ALEXI DORMINEY, female   DOB: 08-30-1960, 54 y.o.   MRN: [id number]   Subjective:    Patient ID: [name], female    DOB: 29-Mar-1960, 54 y.o.   MRN: [id number]  HPI  Patient here for a scheduled follow up.  Still with increased stress with her father's medical issues.  She is planning to retire in 12/16.  Has not been exercising.  Discussed exercise and diet adjustment. A1c improved slightly.  Recent check 7.3,  States am sugars averaging 130s.  No chest pain or tightness.  No sob.  She reports that she feels at times she is not emptying her bladder fully.  No hematuria.  Some pressure.  No nausea or vomiting.  No fever.    Past Medical History  Diagnosis Date  . Diabetes mellitus   . Hypertension   . Hypercholesterolemia   . Reflux   . TMJ dysfunction   . Anemia     Noted 11/2011  . Chest pain     Admitted 11/2011: cath with mild nonobstructive coronary plaque, normal EF, negative cardiac enzymes and negative d-dimer.    Current Outpatient Prescriptions on File Prior to Visit  Medication Sig Dispense Refill  . BAYER CONTOUR TEST test strip CHECK BLOOD SUGAR TWICE A DAY 100 each 3  . cetirizine (ZYRTEC) 10 MG tablet Take 10 mg by mouth daily.    . cyanocobalamin (,VITAMIN B-12,) 1000 MCG/ML injection Inject 1 mL (1,000 mcg total) into the muscle every 30 (thirty) days. 3 mL 3  . escitalopram (LEXAPRO) 10 MG tablet Take 2 tablets (20 mg total) by mouth daily. 60 tablet 2  . estrogens, conjugated, (PREMARIN) 0.45 MG tablet Take 1 tablet (0.45 mg total) by mouth daily. Take daily for 21 days then do not take for 7 days. 30 tablet 5  . Fe Fum-FePoly-Vit C-Vit B3 (INTEGRA) 62.5-62.5-40-3 MG CAPS Take 1 capsule by mouth 2 (two) times daily. 60 capsule 5  . lisinopril (PRINIVIL,ZESTRIL) 20 MG tablet TAKE 1 TABLET BY MOUTH EVERY DAY 90 tablet 1  . metFORMIN (GLUCOPHAGE) 1000 MG tablet TAKE 1 TABLET BY MOUTH TWICE A DAY WITH A MEAL 180 tablet 1  . naproxen sodium (ANAPROX) 220  MG tablet Take 220 mg by mouth daily as needed. For pain    . pantoprazole (PROTONIX) 40 MG tablet TAKE 1 TABLET BY MOUTH EVERY DAY 90 tablet 1  . pravastatin (PRAVACHOL) 40 MG tablet TAKE 1 TABLET (40 MG TOTAL) BY MOUTH DAILY. 90 tablet 1  . sitaGLIPtin (JANUVIA) 100 MG tablet Take 1 tablet (100 mg total) by mouth daily. 30 tablet 2   No current facility-administered medications on file prior to visit.    Review of Systems  Constitutional: Negative for appetite change and unexpected weight change.  HENT: Negative for congestion and sinus pressure.   Respiratory: Negative for cough, chest tightness and shortness of breath.   Cardiovascular: Negative for chest pain, palpitations and leg swelling.  Gastrointestinal: Negative for nausea, abdominal pain and diarrhea.  Genitourinary: Negative for dysuria and difficulty urinating.  Neurological: Negative for dizziness, light-headedness and headaches.  Psychiatric/Behavioral: Negative for dysphoric mood and agitation.       Objective:    Physical Exam  Constitutional: She appears well-developed and well-nourished. No distress.  HENT:  Nose: Nose normal.  Mouth/Throat: Oropharynx is clear and moist.  Neck: Neck supple. No thyromegaly present.  Cardiovascular: Normal rate and regular rhythm.   Pulmonary/Chest: Breath sounds normal. No respiratory distress. She  has no wheezes.  Abdominal: Soft. Bowel sounds are normal. There is no tenderness.  Musculoskeletal: She exhibits no edema or tenderness.  Lymphadenopathy:    She has no cervical adenopathy.  Skin: No rash noted. No erythema.  Psychiatric: She has a normal mood and affect. Her behavior is normal.    BP 120/80 mmHg  Pulse 81  Temp(Src) 98 F (36.7 C) (Oral)  Ht 5' 3.75" (1.619 m)  Wt 240 lb 4 oz (108.977 kg)  BMI 41.58 kg/m2  SpO2 97% Wt Readings from Last 3 Encounters:  06/30/14 240 lb 4 oz (108.977 kg)  03/31/14 238 lb (107.956 kg)  02/05/14 238 lb (107.956 kg)      Lab Results  Component Value Date   WBC 9.3 06/27/2014   HGB 11.6* 06/27/2014   HCT 35.3* 06/27/2014   PLT 209.0 06/27/2014   GLUCOSE 172* 06/27/2014   CHOL 157 06/27/2014   TRIG 189.0* 06/27/2014   HDL 54.80 06/27/2014   LDLDIRECT 140.9 02/27/2012   LDLCALC 64 06/27/2014   ALT 24 06/27/2014   AST 23 06/27/2014   NA 137 06/27/2014   K 4.3 06/27/2014   CL 101 06/27/2014   CREATININE 0.57 06/27/2014   BUN 10 06/27/2014   CO2 29 06/27/2014   TSH 1.25 11/11/2013   HGBA1C 7.3* 06/27/2014   MICROALBUR 1.9 03/12/2014       Assessment & Plan:   Problem List Items Addressed This Visit    Anemia    GI w/up as outlined.  hgb 11.6.  Stable.  Follow.        Relevant Orders   CBC with Differential/Platelet   Ferritin   CAD (coronary artery disease)    Stable.  Continue risk factor modification.        Diabetes mellitus    a1c slightly improved.  Discussed diet and exercise.  Hold on changing medications.  Follow met b and a1c.        Relevant Orders   Hemoglobin Y1V   Basic metabolic panel   Microalbumin / creatinine urine ratio   Environmental allergies    Stable.        Health care maintenance    Physical 03/31/14 - physical.  Colonoscopy 05/04/10 -  Two adenomatous polyps.  Colonoscopy 05/20/13 - diverticulosis.  Mammogram 05/06/14 - Birads I.        Hypercholesterolemia    Low cholesterol diet and exercise.  Follow lipid panel and liver function.  Continue pravastatin.        Relevant Orders   Lipid panel   Hepatic function panel   Hypertension    Blood pressure doing well.  Same medication regimen.  Follow pressures.  Follow metabolic panel.        Pelvic pressure in female - Primary    With increased pressure and feeling not emptying bladder fully - at times. Check urinalysis.  Confirm no infection.       Relevant Orders   Urinalysis, Routine w reflex microscopic (Completed)   CULTURE, URINE COMPREHENSIVE (Completed)   Severe obesity (BMI >= 40)     Diet and exercise.        Sleep apnea    Continue CPAP.       Relevant Orders   TSH     I spent 25 minutes with the patient and more than 50% of the time was spent in consultation regarding the above.     Einar Pheasant, MD

## 2014-06-30 NOTE — Progress Notes (Signed)
Pre visit review using our clinic review tool, if applicable. No additional management support is needed unless otherwise documented below in the visit note. 

## 2014-07-02 ENCOUNTER — Encounter: Payer: Self-pay | Admitting: Internal Medicine

## 2014-07-02 LAB — CULTURE, URINE COMPREHENSIVE
Colony Count: NO GROWTH
ORGANISM ID, BACTERIA: NO GROWTH

## 2014-07-06 ENCOUNTER — Encounter: Payer: Self-pay | Admitting: Internal Medicine

## 2014-07-06 DIAGNOSIS — R102 Pelvic and perineal pain unspecified side: Secondary | ICD-10-CM | POA: Insufficient documentation

## 2014-07-06 DIAGNOSIS — Z0000 Encounter for general adult medical examination without abnormal findings: Secondary | ICD-10-CM | POA: Insufficient documentation

## 2014-07-06 NOTE — Assessment & Plan Note (Signed)
a1c slightly improved.  Discussed diet and exercise.  Hold on changing medications.  Follow met b and a1c.

## 2014-07-06 NOTE — Assessment & Plan Note (Signed)
Continue CPAP.  

## 2014-07-06 NOTE — Assessment & Plan Note (Signed)
GI w/up as outlined.  hgb 11.6.  Stable.  Follow.

## 2014-07-06 NOTE — Assessment & Plan Note (Signed)
Physical 03/31/14 - physical.  Colonoscopy 05/04/10 -  Two adenomatous polyps.  Colonoscopy 05/20/13 - diverticulosis.  Mammogram 05/06/14 - Birads I.

## 2014-07-06 NOTE — Assessment & Plan Note (Signed)
Low cholesterol diet and exercise.  Follow lipid panel and liver function.  Continue pravastatin.

## 2014-07-06 NOTE — Assessment & Plan Note (Signed)
Stable

## 2014-07-06 NOTE — Assessment & Plan Note (Signed)
With increased pressure and feeling not emptying bladder fully - at times. Check urinalysis.  Confirm no infection.

## 2014-07-06 NOTE — Assessment & Plan Note (Signed)
Stable. Continue risk factor modification.  

## 2014-07-06 NOTE — Assessment & Plan Note (Signed)
Blood pressure doing well.  Same medication regimen.  Follow pressures.  Follow metabolic panel.   

## 2014-07-06 NOTE — Assessment & Plan Note (Signed)
Diet and exercise.   

## 2014-07-09 ENCOUNTER — Other Ambulatory Visit: Payer: Self-pay | Admitting: *Deleted

## 2014-07-15 ENCOUNTER — Other Ambulatory Visit: Payer: Self-pay | Admitting: *Deleted

## 2014-07-15 MED ORDER — ESCITALOPRAM OXALATE 10 MG PO TABS: 20.0000 mg | ORAL_TABLET | Freq: Every day | ORAL | Status: DC

## 2014-08-04 ENCOUNTER — Other Ambulatory Visit: Payer: Self-pay | Admitting: *Deleted

## 2014-08-04 MED ORDER — SITAGLIPTIN PHOSPHATE 100 MG PO TABS: 100.0000 mg | ORAL_TABLET | Freq: Every day | ORAL | Status: DC

## 2014-08-18 IMAGING — CR DG CHEST 2V
2 series · 2 of 2 positions shown · non-contrast
Comparison: No priors.

CLINICAL DATA: Chest tightness.

CHEST - 2 VIEW

[w chest pa]
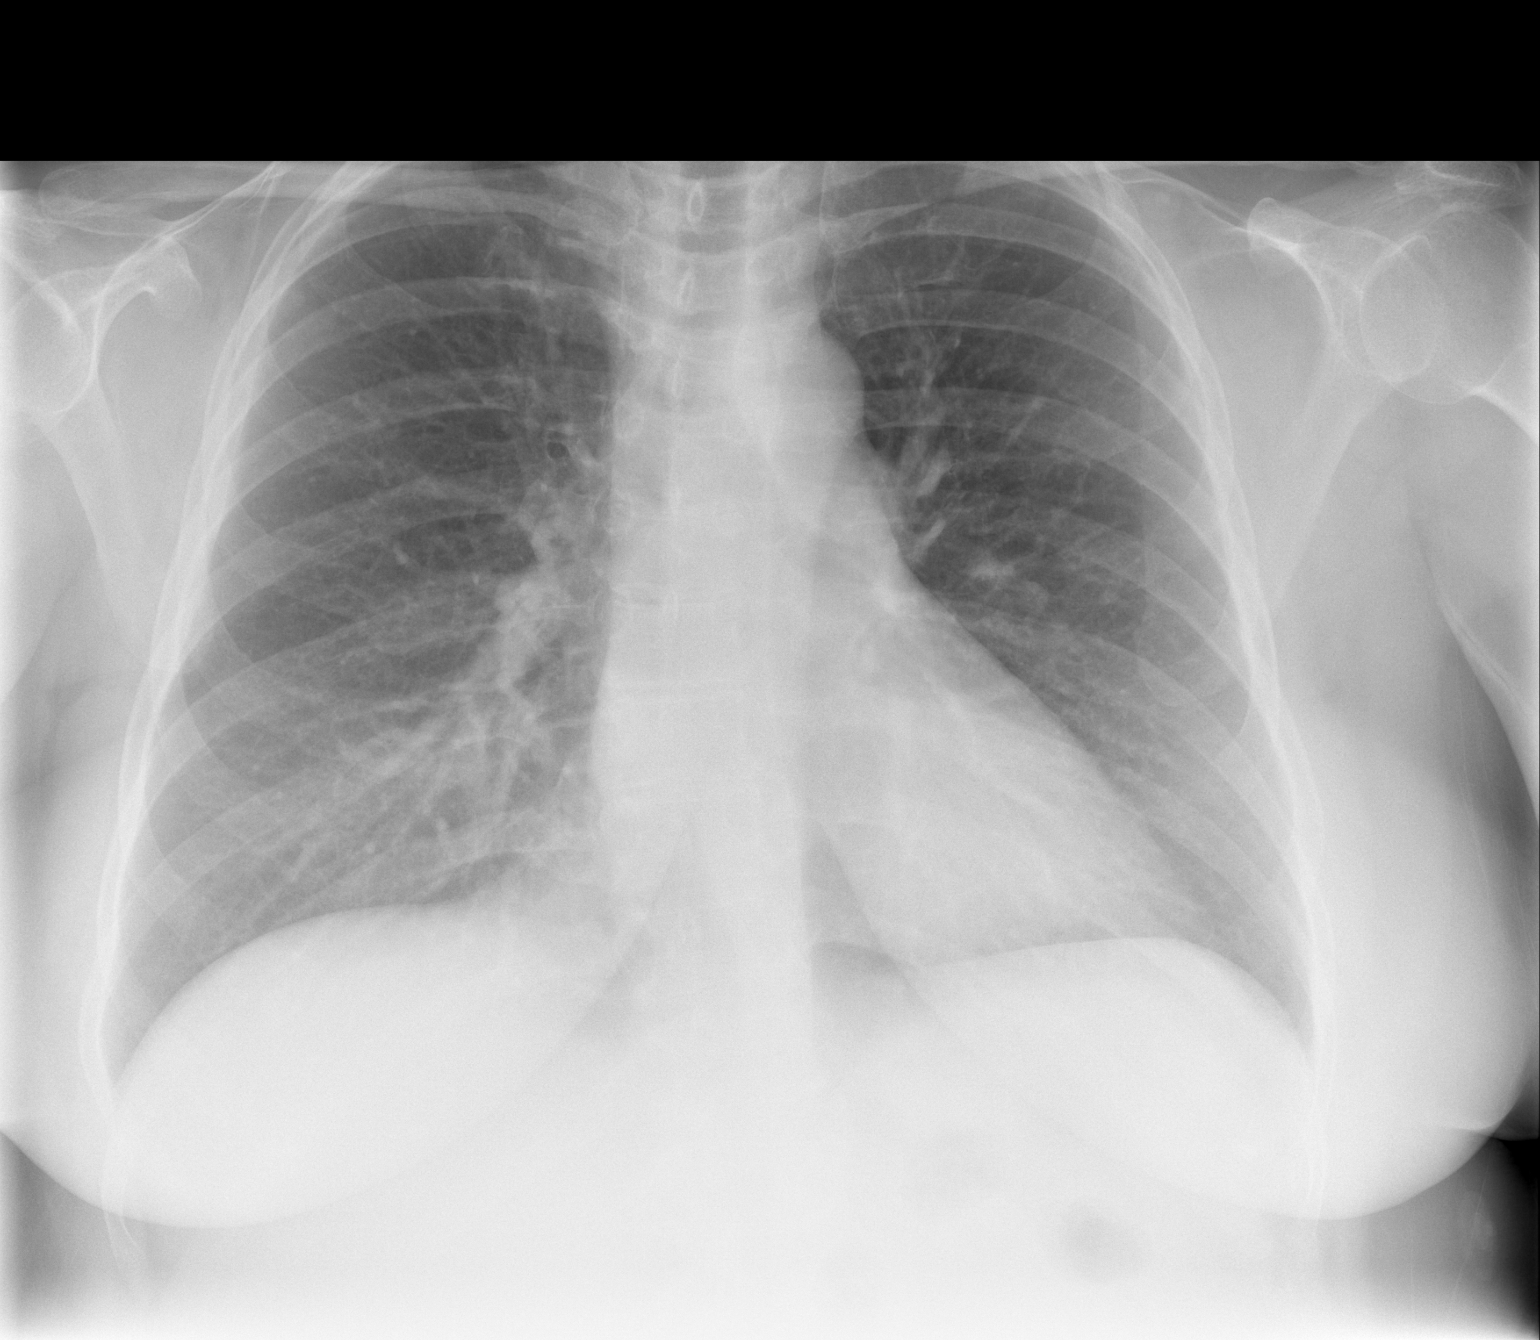

[w chest lat]
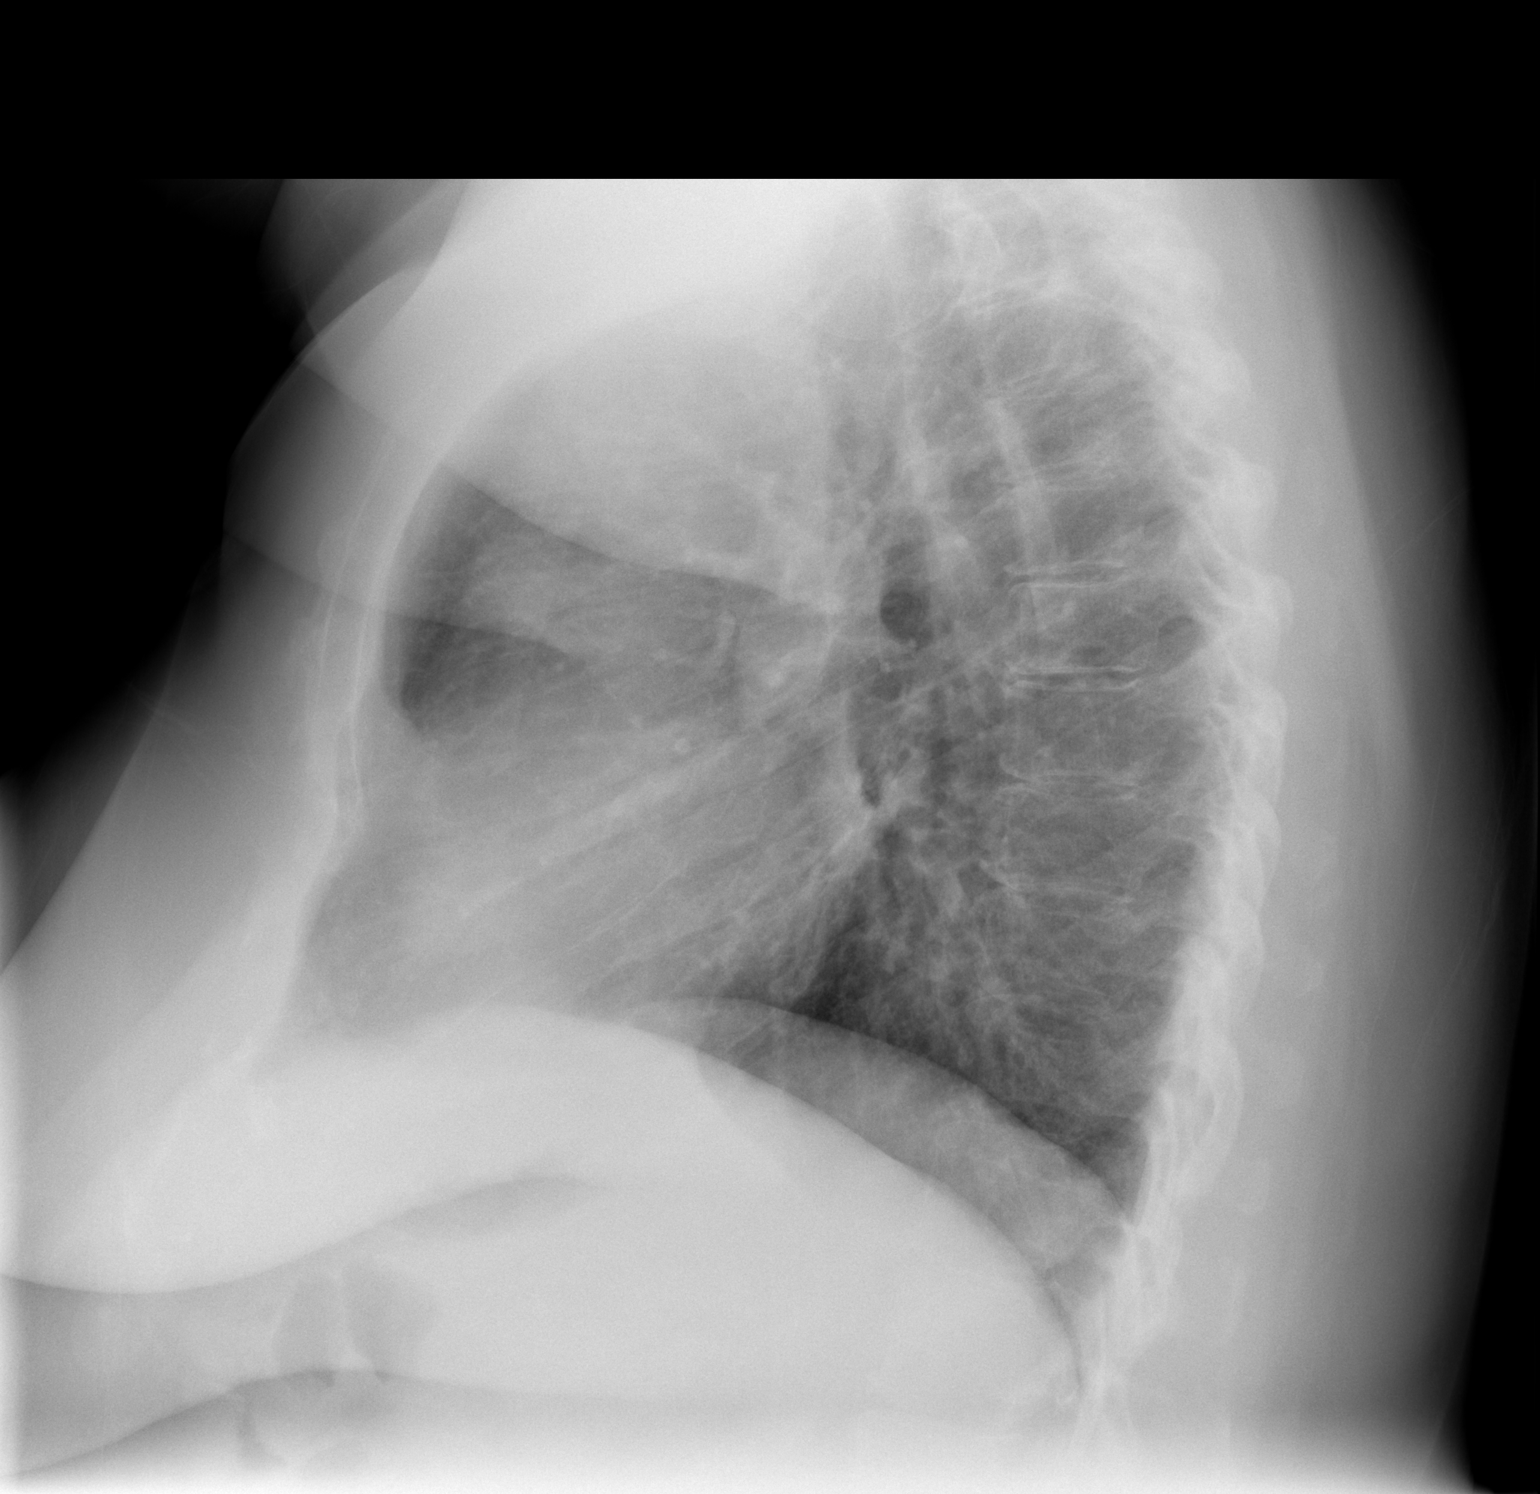

[2 of 2 positions shown; findings below may reference images not displayed]

FINDINGS: Lung volumes are normal.  No consolidative airspace
disease.  No pleural effusions.  No pneumothorax.  No pulmonary
nodule or mass noted.  Pulmonary vasculature and the
cardiomediastinal silhouette are within normal limits.
IMPRESSION: 1. No radiographic evidence of acute cardiopulmonary disease.

## 2014-09-03 ENCOUNTER — Other Ambulatory Visit: Payer: Self-pay | Admitting: *Deleted

## 2014-09-03 MED ORDER — LISINOPRIL 20 MG PO TABS: 20.0000 mg | ORAL_TABLET | Freq: Every day | ORAL | Status: DC

## 2014-09-18 ENCOUNTER — Ambulatory Visit (INDEPENDENT_AMBULATORY_CARE_PROVIDER_SITE_OTHER): Payer: BC Managed Care – PPO | Admitting: Nurse Practitioner

## 2014-09-18 VITALS — BP 120/70 | HR 94 | Temp 98.2°F | Resp 16 | Ht 63.75 in | Wt 237.8 lb

## 2014-09-18 DIAGNOSIS — R300 Dysuria: Secondary | ICD-10-CM | POA: Diagnosis not present

## 2014-09-18 DIAGNOSIS — R399 Unspecified symptoms and signs involving the genitourinary system: Secondary | ICD-10-CM | POA: Insufficient documentation

## 2014-09-18 LAB — POCT URINALYSIS DIPSTICK
Bilirubin, UA: NEGATIVE
Blood, UA: NEGATIVE
Glucose, UA: NEGATIVE
Ketones, UA: NEGATIVE
Leukocytes, UA: NEGATIVE
Nitrite, UA: NEGATIVE
PH UA: 6
Protein, UA: NEGATIVE
Spec Grav, UA: 1.025
Urobilinogen, UA: 0.2

## 2014-09-18 MED ORDER — CIPROFLOXACIN HCL 250 MG PO TABS: 250.0000 mg | ORAL_TABLET | Freq: Two times a day (BID) | ORAL | Status: DC

## 2014-09-18 NOTE — Patient Instructions (Signed)
We will contact you with your results from the culture.   Let us know if symptoms persist.

## 2014-09-18 NOTE — Progress Notes (Signed)
   Subjective:    Patient ID: Talmadge CoventryKathy W Walker, female    DOB: [birth year], 54 y.o.   MRN: [id number]  HPI  Ms. Dan HumphreysWalker is a 54 yo female with a CC UTI symptoms x 5 days.   1) Uristat since Sunday  Lots of water, symptoms worsening   Frequency and urgent Dysuria, discomfort, cramping lower pelvic area  Review of Systems  Constitutional: Negative for fever, chills, diaphoresis and fatigue.  Genitourinary: Positive for dysuria, urgency, frequency and pelvic pain. Negative for hematuria, flank pain, decreased urine volume, vaginal bleeding, vaginal discharge and difficulty urinating.      Objective:   Physical Exam  Constitutional: She is oriented to person, place, and time. She appears well-developed and well-nourished. No distress.  BP 120/70 mmHg  Pulse 94  Temp(Src) 98.2 F (36.8 C)  Resp 16  Ht 5' 3.75" (1.619 m)  Wt 237 lb 12.8 oz (107.865 kg)  BMI 41.15 kg/m2  SpO2 96%   HENT:  Head: Normocephalic and atraumatic.  Right Ear: External ear normal.  Left Ear: External ear normal.  Abdominal: There is no CVA tenderness.  Genitourinary:  Deferred- will do pelvic if no other findings on labs  Neurological: She is alert and oriented to person, place, and time.  Skin: Skin is warm and dry. No rash noted. She is not diaphoretic.  Psychiatric: She has a normal mood and affect. Her behavior is normal. Judgment and thought content normal.      Assessment & Plan:

## 2014-09-18 NOTE — Assessment & Plan Note (Signed)
Pt has severe symptoms without POCT urine being positive for anything. Will culture and treat with cipro 250 mg twice daily for 3 days. FU prn worsening/failure to improve.

## 2014-09-20 LAB — URINE CULTURE: Colony Count: >=100000

## 2014-10-01 ENCOUNTER — Encounter: Payer: Self-pay | Admitting: Nurse Practitioner

## 2014-10-01 NOTE — Assessment & Plan Note (Signed)
POCT urine shows probable UTI. Will start on Cipro bid for 3 days. Will obtain culture. FU prn worsening/failure to improve.

## 2014-10-14 ENCOUNTER — Encounter: Payer: Self-pay | Admitting: Internal Medicine

## 2014-10-14 MED ORDER — ESTROGENS CONJUGATED 0.45 MG PO TABS: 0.4500 mg | ORAL_TABLET | Freq: Every day | ORAL | Status: DC

## 2014-10-14 NOTE — Telephone Encounter (Signed)
Refilled premarin #[id number] with one refill.

## 2014-10-15 ENCOUNTER — Other Ambulatory Visit: Payer: Self-pay

## 2014-10-15 MED ORDER — ESTROGENS CONJUGATED 0.45 MG PO TABS: 0.4500 mg | ORAL_TABLET | Freq: Every day | ORAL | Status: DC

## 2014-10-22 ENCOUNTER — Other Ambulatory Visit: Payer: Self-pay

## 2014-10-22 MED ORDER — ESCITALOPRAM OXALATE 10 MG PO TABS: 20.0000 mg | ORAL_TABLET | Freq: Every day | ORAL | Status: DC

## 2014-10-22 NOTE — Telephone Encounter (Signed)
Pt lexapro was refilled for 3 months

## 2014-10-30 ENCOUNTER — Telehealth: Payer: Self-pay | Admitting: *Deleted

## 2014-10-30 DIAGNOSIS — R300 Dysuria: Secondary | ICD-10-CM

## 2014-10-30 NOTE — Telephone Encounter (Signed)
I have ok'd for urine and culture.

## 2014-10-30 NOTE — Telephone Encounter (Signed)
Pt is complaining of frequency & urgency since Monday. Would like to have it checked since she is coming in for labs anyway. Also states that she will go to Urgent care over the weekend if sx's are worse. Please add UA if you are okay with her having it.

## 2014-10-30 NOTE — Telephone Encounter (Signed)
Thanks

## 2014-10-30 NOTE — Telephone Encounter (Signed)
Pt has lab appoint scheduled with no orders.  Please enter orders with UA.

## 2014-10-30 NOTE — Telephone Encounter (Signed)
Patient requested to have a urine specimen in the morning along wit her fasting labs for a UTi. -Thanks

## 2014-10-30 NOTE — Telephone Encounter (Signed)
Need to know what symptoms she is having?  I will not be in the office tomorrow, so if any problems would need to be seen.  I do not mind ordering the urinalysis and the culture since she has an appt with me next Tuesday.  If any acute problems or symptoms - would need to be seen.  Just let me know.  Can order if need to.

## 2014-10-31 ENCOUNTER — Other Ambulatory Visit (INDEPENDENT_AMBULATORY_CARE_PROVIDER_SITE_OTHER): Payer: BC Managed Care – PPO

## 2014-10-31 DIAGNOSIS — R300 Dysuria: Secondary | ICD-10-CM | POA: Diagnosis not present

## 2014-10-31 DIAGNOSIS — E119 Type 2 diabetes mellitus without complications: Secondary | ICD-10-CM

## 2014-10-31 DIAGNOSIS — D649 Anemia, unspecified: Secondary | ICD-10-CM

## 2014-10-31 DIAGNOSIS — G4730 Sleep apnea, unspecified: Secondary | ICD-10-CM

## 2014-10-31 DIAGNOSIS — E7800 Pure hypercholesterolemia, unspecified: Secondary | ICD-10-CM

## 2014-10-31 LAB — URINALYSIS, ROUTINE W REFLEX MICROSCOPIC
BILIRUBIN URINE: NEGATIVE
HGB URINE DIPSTICK: NEGATIVE
KETONES UR: NEGATIVE
Leukocytes, UA: NEGATIVE
NITRITE: NEGATIVE
PH: 5.5 (ref 5.0–8.0)
RBC / HPF: NONE SEEN (ref 0–?)
Specific Gravity, Urine: >=1.03 — AB (ref 1.000–1.030)
Total Protein, Urine: NEGATIVE
Urine Glucose: NEGATIVE
Urobilinogen, UA: 0.2 (ref 0.0–1.0)

## 2014-10-31 LAB — CBC WITH DIFFERENTIAL/PLATELET
Basophils Absolute: 0.1 10*3/uL (ref 0.0–0.1)
Basophils Relative: 1 % (ref 0.0–3.0)
EOS ABS: 0.2 10*3/uL (ref 0.0–0.7)
EOS PCT: 2.1 % (ref 0.0–5.0)
HCT: 38.4 % (ref 36.0–46.0)
Hemoglobin: 12.4 g/dL (ref 12.0–15.0)
LYMPHS ABS: 3.1 10*3/uL (ref 0.7–4.0)
LYMPHS PCT: 27.1 % (ref 12.0–46.0)
MCHC: 32.4 g/dL (ref 30.0–36.0)
MCV: 83.9 fl (ref 78.0–100.0)
MONOS PCT: 5 % (ref 3.0–12.0)
Monocytes Absolute: 0.6 10*3/uL (ref 0.1–1.0)
Neutro Abs: 7.4 10*3/uL (ref 1.4–7.7)
Neutrophils Relative %: 64.8 % (ref 43.0–77.0)
PLATELETS: 243 10*3/uL (ref 150.0–400.0)
RBC: 4.58 Mil/uL (ref 3.87–5.11)
RDW: 14.9 % (ref 11.5–15.5)
WBC: 11.5 10*3/uL — ABNORMAL HIGH (ref 4.0–10.5)

## 2014-10-31 LAB — HEPATIC FUNCTION PANEL
ALT: 33 U/L (ref 0–35)
AST: 30 U/L (ref 0–37)
Albumin: 4.3 g/dL (ref 3.5–5.2)
Alkaline Phosphatase: 77 U/L (ref 39–117)
BILIRUBIN DIRECT: 0.1 mg/dL (ref 0.0–0.3)
BILIRUBIN TOTAL: 0.3 mg/dL (ref 0.2–1.2)
Total Protein: 7 g/dL (ref 6.0–8.3)

## 2014-10-31 LAB — FERRITIN: Ferritin: 44.9 ng/mL (ref 10.0–291.0)

## 2014-10-31 LAB — LIPID PANEL
CHOLESTEROL: 173 mg/dL (ref 0–200)
HDL: 53.2 mg/dL (ref >39.00)
NonHDL: 119.82
Total CHOL/HDL Ratio: 3
Triglycerides: 227 mg/dL — ABNORMAL HIGH (ref 0.0–149.0)
VLDL: 45.4 mg/dL — ABNORMAL HIGH (ref 0.0–40.0)

## 2014-10-31 LAB — BASIC METABOLIC PANEL
BUN: 13 mg/dL (ref 6–23)
CALCIUM: 9.6 mg/dL (ref 8.4–10.5)
CO2: 29 mEq/L (ref 19–32)
CREATININE: 0.63 mg/dL (ref 0.40–1.20)
Chloride: 100 mEq/L (ref 96–112)
GFR: 104.02 mL/min (ref >60.00)
GLUCOSE: 159 mg/dL — AB (ref 70–99)
POTASSIUM: 4.5 meq/L (ref 3.5–5.1)
Sodium: 141 mEq/L (ref 135–145)

## 2014-10-31 LAB — HEMOGLOBIN A1C: Hgb A1c MFr Bld: 7.3 % — ABNORMAL HIGH (ref 4.6–6.5)

## 2014-10-31 LAB — TSH: TSH: 2.32 u[IU]/mL (ref 0.35–4.50)

## 2014-10-31 LAB — MICROALBUMIN / CREATININE URINE RATIO
Creatinine,U: 178.6 mg/dL
MICROALB UR: 2.6 mg/dL — AB (ref 0.0–1.9)
Microalb Creat Ratio: 1.5 mg/g (ref 0.0–30.0)

## 2014-10-31 LAB — LDL CHOLESTEROL, DIRECT: LDL DIRECT: 97 mg/dL

## 2014-10-31 NOTE — Addendum Note (Signed)
Addended by: Cristela Blue on: 10/31/2014 08:10 AM   Modules accepted: Orders

## 2014-11-02 ENCOUNTER — Encounter: Payer: Self-pay | Admitting: Internal Medicine

## 2014-11-04 ENCOUNTER — Encounter: Payer: Self-pay | Admitting: Internal Medicine

## 2014-11-04 ENCOUNTER — Ambulatory Visit (INDEPENDENT_AMBULATORY_CARE_PROVIDER_SITE_OTHER): Payer: BC Managed Care – PPO | Admitting: Internal Medicine

## 2014-11-04 VITALS — BP 110/70 | HR 86 | Temp 97.9°F | Ht 63.75 in | Wt 234.0 lb

## 2014-11-04 DIAGNOSIS — E7800 Pure hypercholesterolemia, unspecified: Secondary | ICD-10-CM

## 2014-11-04 DIAGNOSIS — E119 Type 2 diabetes mellitus without complications: Secondary | ICD-10-CM

## 2014-11-04 DIAGNOSIS — I25118 Atherosclerotic heart disease of native coronary artery with other forms of angina pectoris: Secondary | ICD-10-CM

## 2014-11-04 DIAGNOSIS — D649 Anemia, unspecified: Secondary | ICD-10-CM

## 2014-11-04 DIAGNOSIS — R300 Dysuria: Secondary | ICD-10-CM | POA: Diagnosis not present

## 2014-11-04 DIAGNOSIS — I10 Essential (primary) hypertension: Secondary | ICD-10-CM

## 2014-11-04 DIAGNOSIS — R399 Unspecified symptoms and signs involving the genitourinary system: Secondary | ICD-10-CM

## 2014-11-04 DIAGNOSIS — D72829 Elevated white blood cell count, unspecified: Secondary | ICD-10-CM

## 2014-11-04 DIAGNOSIS — G4730 Sleep apnea, unspecified: Secondary | ICD-10-CM

## 2014-11-04 LAB — URINALYSIS, ROUTINE W REFLEX MICROSCOPIC
BILIRUBIN URINE: NEGATIVE
Hgb urine dipstick: NEGATIVE
Ketones, ur: NEGATIVE
Leukocytes, UA: NEGATIVE
Nitrite: NEGATIVE
PH: 6 (ref 5.0–8.0)
RBC / HPF: NONE SEEN (ref 0–?)
Specific Gravity, Urine: >=1.03 — AB (ref 1.000–1.030)
Total Protein, Urine: NEGATIVE
UROBILINOGEN UA: 0.2 (ref 0.0–1.0)
Urine Glucose: NEGATIVE

## 2014-11-04 MED ORDER — CYCLOBENZAPRINE HCL 10 MG PO TABS: ORAL_TABLET | ORAL | Status: DC

## 2014-11-04 NOTE — Progress Notes (Signed)
Patient ID: Evelyn James, female   DOB: 20-Jun-1960, 54 y.o.   MRN: [id number]   Subjective:    Patient ID: Evelyn James, female    DOB: 1960-10-24, 54 y.o.   MRN: [id number]  HPI  Patient here for a scheduled follow up.  She is helping take care of her father.  He recently had MI.  Increased stress related to his health issues.  Does not feel she needs any further intervention at this time.  Has adjusted her diet.  AM sugars now averaging 90-130-140 and pm sugars lower.  Just started this over the last several weeks.  No cardiac symptoms with increased activity or exertion.  No sob.  No acid reflux.  Some urinary issues.  Some discomfort with urination, especially end urination.  Worse in am.  Recent urinalysis obtained.  Urine culture not sent.  She is actually feeling a little better today and is ok to give another urine culture and wait for results.  Will let me know if symptoms worsen.  Bowels stable.     Past Medical History  Diagnosis Date  . Diabetes mellitus   . Hypertension   . Hypercholesterolemia   . Reflux   . TMJ dysfunction   . Anemia     Noted 11/2011  . Chest pain     Admitted 11/2011: cath with mild nonobstructive coronary plaque, normal EF, negative cardiac enzymes and negative d-dimer.   Past Surgical History  Procedure Laterality Date  . Abdominal hysterectomy    . Submandibular mass excision    . Knee surgery    . Bilateral salpingoophorectomy      benign ovarian tumor  . Left heart catheterization with coronary angiogram N/A 12/09/2011    Procedure: LEFT HEART CATHETERIZATION WITH CORONARY ANGIOGRAM;  Surgeon: Minus Breeding, MD;  Location: [facility] CATH LAB;  Service: Cardiovascular;  Laterality: N/A;   Family History  Problem Relation Age of Onset  . Heart disease Father     Father had CABG in his 70s  . Lung cancer Mother   . Colon polyps Father   . Breast cancer Paternal Grandmother   . Arthritis/Rheumatoid Paternal Grandfather    Social History    Social History  . Marital Status: Divorced    Spouse Name: N/A  . Number of Children: 2  . Years of Education: N/A   Social History Main Topics  . Smoking status: Never Smoker   . Smokeless tobacco: Never Used  . Alcohol Use: No  . Drug Use: No  . Sexual Activity: Not Asked   Other Topics Concern  . None   Social History Narrative    Outpatient Encounter Prescriptions as of 11/04/2014  Medication Sig  . BAYER CONTOUR TEST test strip CHECK BLOOD SUGAR TWICE A DAY  . cetirizine (ZYRTEC) 10 MG tablet Take 10 mg by mouth daily.  . cyanocobalamin (,VITAMIN B-12,) 1000 MCG/ML injection Inject 1 mL (1,000 mcg total) into the muscle every 30 (thirty) days.  . cyclobenzaprine (FLEXERIL) 10 MG tablet 1/2 - 1 tablet q pm prn  . escitalopram (LEXAPRO) 10 MG tablet Take 2 tablets (20 mg total) by mouth daily.  Marland Kitchen estrogens, conjugated, (PREMARIN) 0.45 MG tablet Take 1 tablet (0.45 mg total) by mouth daily.  . Fe Fum-FePoly-Vit C-Vit B3 (INTEGRA) 62.5-62.5-40-3 MG CAPS Take 1 capsule by mouth 2 (two) times daily.  . fluticasone (FLONASE) 50 MCG/ACT nasal spray Place 2 sprays into both nostrils daily.  Marland Kitchen lisinopril (PRINIVIL,ZESTRIL) 20 MG tablet Take 1  tablet (20 mg total) by mouth daily.  . metFORMIN (GLUCOPHAGE) 1000 MG tablet TAKE 1 TABLET BY MOUTH TWICE A DAY WITH A MEAL  . naproxen sodium (ANAPROX) 220 MG tablet Take 220 mg by mouth daily as needed. For pain  . pantoprazole (PROTONIX) 40 MG tablet TAKE 1 TABLET BY MOUTH EVERY DAY  . pravastatin (PRAVACHOL) 40 MG tablet TAKE 1 TABLET (40 MG TOTAL) BY MOUTH DAILY.  . sitaGLIPtin (JANUVIA) 100 MG tablet Take 1 tablet (100 mg total) by mouth daily.  . [DISCONTINUED] cyclobenzaprine (FLEXERIL) 10 MG tablet 1/2 - 1 tablet q pm prn  . [DISCONTINUED] ciprofloxacin (CIPRO) 250 MG tablet Take 1 tablet (250 mg total) by mouth 2 (two) times daily.   No facility-administered encounter medications on file as of 11/04/2014.    Review of Systems   Constitutional: Negative for appetite change and unexpected weight change.  HENT: Negative for congestion and sinus pressure.   Respiratory: Negative for cough, chest tightness and shortness of breath.   Cardiovascular: Negative for chest pain, palpitations and leg swelling.  Gastrointestinal: Negative for nausea, vomiting, abdominal pain and diarrhea.  Genitourinary: Positive for dysuria. Negative for hematuria.  Musculoskeletal: Negative for back pain.  Skin: Negative for color change and rash.  Neurological: Negative for dizziness, light-headedness and headaches.  Psychiatric/Behavioral: Negative for dysphoric mood and agitation.       Objective:    Physical Exam  Constitutional: She appears well-developed and well-nourished. No distress.  HENT:  Nose: Nose normal.  Mouth/Throat: Oropharynx is clear and moist.  Eyes: Conjunctivae are normal. Right eye exhibits no discharge. Left eye exhibits no discharge.  Neck: Neck supple. No thyromegaly present.  Cardiovascular: Normal rate and regular rhythm.   Pulmonary/Chest: Breath sounds normal. No respiratory distress. She has no wheezes.  Abdominal: Soft. Bowel sounds are normal. There is no tenderness.  Musculoskeletal: She exhibits no edema or tenderness.  Lymphadenopathy:    She has no cervical adenopathy.  Skin: No rash noted. No erythema.  Psychiatric: She has a normal mood and affect. Her behavior is normal.    BP 110/70 mmHg  Pulse 86  Temp(Src) 97.9 F (36.6 C) (Oral)  Ht 5' 3.75" (1.619 m)  Wt 234 lb (106.142 kg)  BMI 40.49 kg/m2  SpO2 94% Wt Readings from Last 3 Encounters:  11/04/14 234 lb (106.142 kg)  09/18/14 237 lb 12.8 oz (107.865 kg)  06/30/14 240 lb 4 oz (108.977 kg)     Lab Results  Component Value Date   WBC 11.5* 10/31/2014   HGB 12.4 10/31/2014   HCT 38.4 10/31/2014   PLT 243.0 10/31/2014   GLUCOSE 159* 10/31/2014   CHOL 173 10/31/2014   TRIG 227.0* 10/31/2014   HDL 53.20 10/31/2014    LDLDIRECT 97.0 10/31/2014   LDLCALC 64 06/27/2014   ALT 33 10/31/2014   AST 30 10/31/2014   NA 141 10/31/2014   K 4.5 10/31/2014   CL 100 10/31/2014   CREATININE 0.63 10/31/2014   BUN 13 10/31/2014   CO2 29 10/31/2014   TSH 2.32 10/31/2014   HGBA1C 7.3* 10/31/2014   MICROALBUR 2.6* 10/31/2014       Assessment & Plan:   Problem List Items Addressed This Visit    Anemia    EGD and colonoscopy as outlined in overview.  Recommended f/u colonoscopy in 10 years.  Recent hgb 10/31/14 - wnl.       CAD (coronary artery disease)    Continue risk factor modification.  Stable.  Diabetes mellitus    Low carb diet and exercise.  She has adjusted her diet.  Sugars per her report have improved.  a1c 7.3.  Follow met b and a1c.        Relevant Orders   Hemoglobin A1c   Dysuria - Primary    Check urinalysis and urine culture.       Relevant Orders   Urinalysis, Routine w reflex microscopic (not at [facility]) (Completed)   CULTURE, URINE COMPREHENSIVE (Completed)   Hypercholesterolemia    Low cholesterol diet and exercise.  On pravastatin.  Recent LDL 97.  Triglycerides 227.  Low carb diet.  Will follow.        Relevant Orders   Lipid panel   Hepatic function panel   Hypertension    Blood pressure doing well.  Same medication regimen.  Follow pressures.  Follow metabolic panel.        Relevant Orders   Basic metabolic panel   Severe obesity (BMI >= 40)    Diet and exercise.  Follow.  She has adjusted her diet.        Sleep apnea    Continue CPAP.       UTI symptoms    Recent urinalysis.  Urine culture not sent.  Symptoms have improved some.  Recheck urinalysis and urine culture.  Hold on abx.         Other Visit Diagnoses    Leukocytosis        Relevant Orders    CBC with Differential/Platelet        Einar Pheasant, MD

## 2014-11-04 NOTE — Progress Notes (Signed)
Pre visit review using our clinic review tool, if applicable. No additional management support is needed unless otherwise documented below in the visit note. 

## 2014-11-06 LAB — CULTURE, URINE COMPREHENSIVE

## 2014-11-07 ENCOUNTER — Other Ambulatory Visit: Payer: Self-pay | Admitting: *Deleted

## 2014-11-07 MED ORDER — CIPROFLOXACIN HCL 500 MG PO TABS: 500.0000 mg | ORAL_TABLET | Freq: Two times a day (BID) | ORAL | Status: AC

## 2014-11-08 ENCOUNTER — Encounter: Payer: Self-pay | Admitting: Internal Medicine

## 2014-11-08 NOTE — Assessment & Plan Note (Addendum)
EGD and colonoscopy as outlined in overview.  Recommended f/u colonoscopy in 10 years.  Recent hgb 10/31/14 - wnl.

## 2014-11-08 NOTE — Assessment & Plan Note (Signed)
Continue CPAP.  

## 2014-11-08 NOTE — Assessment & Plan Note (Signed)
Recent urinalysis.  Urine culture not sent.  Symptoms have improved some.  Recheck urinalysis and urine culture.  Hold on abx.

## 2014-11-08 NOTE — Assessment & Plan Note (Signed)
Low carb diet and exercise.  She has adjusted her diet.  Sugars per her report have improved.  a1c 7.3.  Follow met b and a1c.

## 2014-11-08 NOTE — Assessment & Plan Note (Signed)
Diet and exercise.  Follow.  She has adjusted her diet.

## 2014-11-08 NOTE — Assessment & Plan Note (Signed)
Check urinalysis and urine culture 

## 2014-11-08 NOTE — Assessment & Plan Note (Signed)
Blood pressure doing well.  Same medication regimen.  Follow pressures.  Follow metabolic panel.   

## 2014-11-08 NOTE — Assessment & Plan Note (Signed)
Continue risk factor modification.  Stable.  

## 2014-11-08 NOTE — Assessment & Plan Note (Signed)
Low cholesterol diet and exercise.  On pravastatin.  Recent LDL 97.  Triglycerides 227.  Low carb diet.  Will follow.

## 2014-11-28 ENCOUNTER — Other Ambulatory Visit: Payer: Self-pay | Admitting: Internal Medicine

## 2014-12-17 ENCOUNTER — Other Ambulatory Visit: Payer: Self-pay | Admitting: Internal Medicine

## 2015-01-11 ENCOUNTER — Other Ambulatory Visit: Payer: Self-pay | Admitting: Internal Medicine

## 2015-01-20 ENCOUNTER — Other Ambulatory Visit: Payer: Self-pay | Admitting: Internal Medicine

## 2015-01-29 ENCOUNTER — Other Ambulatory Visit (INDEPENDENT_AMBULATORY_CARE_PROVIDER_SITE_OTHER): Payer: BC Managed Care – PPO

## 2015-01-29 DIAGNOSIS — E7800 Pure hypercholesterolemia, unspecified: Secondary | ICD-10-CM | POA: Diagnosis not present

## 2015-01-29 DIAGNOSIS — I10 Essential (primary) hypertension: Secondary | ICD-10-CM

## 2015-01-29 DIAGNOSIS — D72829 Elevated white blood cell count, unspecified: Secondary | ICD-10-CM | POA: Diagnosis not present

## 2015-01-29 DIAGNOSIS — E119 Type 2 diabetes mellitus without complications: Secondary | ICD-10-CM | POA: Diagnosis not present

## 2015-01-29 LAB — CBC WITH DIFFERENTIAL/PLATELET
BASOS PCT: 0.4 % (ref 0.0–3.0)
Basophils Absolute: 0 10*3/uL (ref 0.0–0.1)
EOS PCT: 1.6 % (ref 0.0–5.0)
Eosinophils Absolute: 0.2 10*3/uL (ref 0.0–0.7)
HCT: 37 % (ref 36.0–46.0)
HEMOGLOBIN: 11.9 g/dL — AB (ref 12.0–15.0)
LYMPHS ABS: 2.7 10*3/uL (ref 0.7–4.0)
Lymphocytes Relative: 24.6 % (ref 12.0–46.0)
MCHC: 32.1 g/dL (ref 30.0–36.0)
MCV: 84.4 fl (ref 78.0–100.0)
MONO ABS: 0.7 10*3/uL (ref 0.1–1.0)
MONOS PCT: 5.9 % (ref 3.0–12.0)
NEUTROS PCT: 67.5 % (ref 43.0–77.0)
Neutro Abs: 7.5 10*3/uL (ref 1.4–7.7)
Platelets: 228 10*3/uL (ref 150.0–400.0)
RBC: 4.38 Mil/uL (ref 3.87–5.11)
RDW: 14.2 % (ref 11.5–15.5)
WBC: 11.1 10*3/uL — ABNORMAL HIGH (ref 4.0–10.5)

## 2015-01-29 LAB — LIPID PANEL
CHOLESTEROL: 159 mg/dL (ref 0–200)
HDL: 57.6 mg/dL (ref >39.00)
LDL CALC: 74 mg/dL (ref 0–99)
NONHDL: 101.63
Total CHOL/HDL Ratio: 3
Triglycerides: 137 mg/dL (ref 0.0–149.0)
VLDL: 27.4 mg/dL (ref 0.0–40.0)

## 2015-01-29 LAB — BASIC METABOLIC PANEL
BUN: 11 mg/dL (ref 6–23)
CHLORIDE: 100 meq/L (ref 96–112)
CO2: 29 meq/L (ref 19–32)
CREATININE: 0.6 mg/dL (ref 0.40–1.20)
Calcium: 9.2 mg/dL (ref 8.4–10.5)
GFR: 109.95 mL/min (ref >60.00)
Glucose, Bld: 131 mg/dL — ABNORMAL HIGH (ref 70–99)
POTASSIUM: 4.5 meq/L (ref 3.5–5.1)
SODIUM: 139 meq/L (ref 135–145)

## 2015-01-29 LAB — HEPATIC FUNCTION PANEL
ALT: 26 U/L (ref 0–35)
AST: 25 U/L (ref 0–37)
Albumin: 4 g/dL (ref 3.5–5.2)
Alkaline Phosphatase: 66 U/L (ref 39–117)
BILIRUBIN TOTAL: 0.3 mg/dL (ref 0.2–1.2)
Bilirubin, Direct: 0.1 mg/dL (ref 0.0–0.3)
Total Protein: 6.9 g/dL (ref 6.0–8.3)

## 2015-01-29 LAB — HEMOGLOBIN A1C: HEMOGLOBIN A1C: 7.4 % — AB (ref 4.6–6.5)

## 2015-01-30 ENCOUNTER — Encounter: Payer: Self-pay | Admitting: Internal Medicine

## 2015-01-31 ENCOUNTER — Other Ambulatory Visit: Payer: Self-pay | Admitting: Internal Medicine

## 2015-02-01 IMAGING — MG MM CAD SCREENING MAMMO
1 series · 4 of 4 positions shown · non-contrast
Comparison: none

REASON FOR EXAM: SCR MAMMO NO ORDER
COMMENTS:

PROCEDURE:     MAM - MAM DGTL SCRN MAM NO ORDER W/CAD  - May 23, 2012  [DATE]
RESULT:     Bilateral digital screening mammogram with CAD dated 05/23/2012
Comparison study/studies dated:
05/17/2011, 03/11/2010, 08/18/2008, and 08/15/2007

[R CC · right · 4 of 4 slices shown]
[im 1/4]
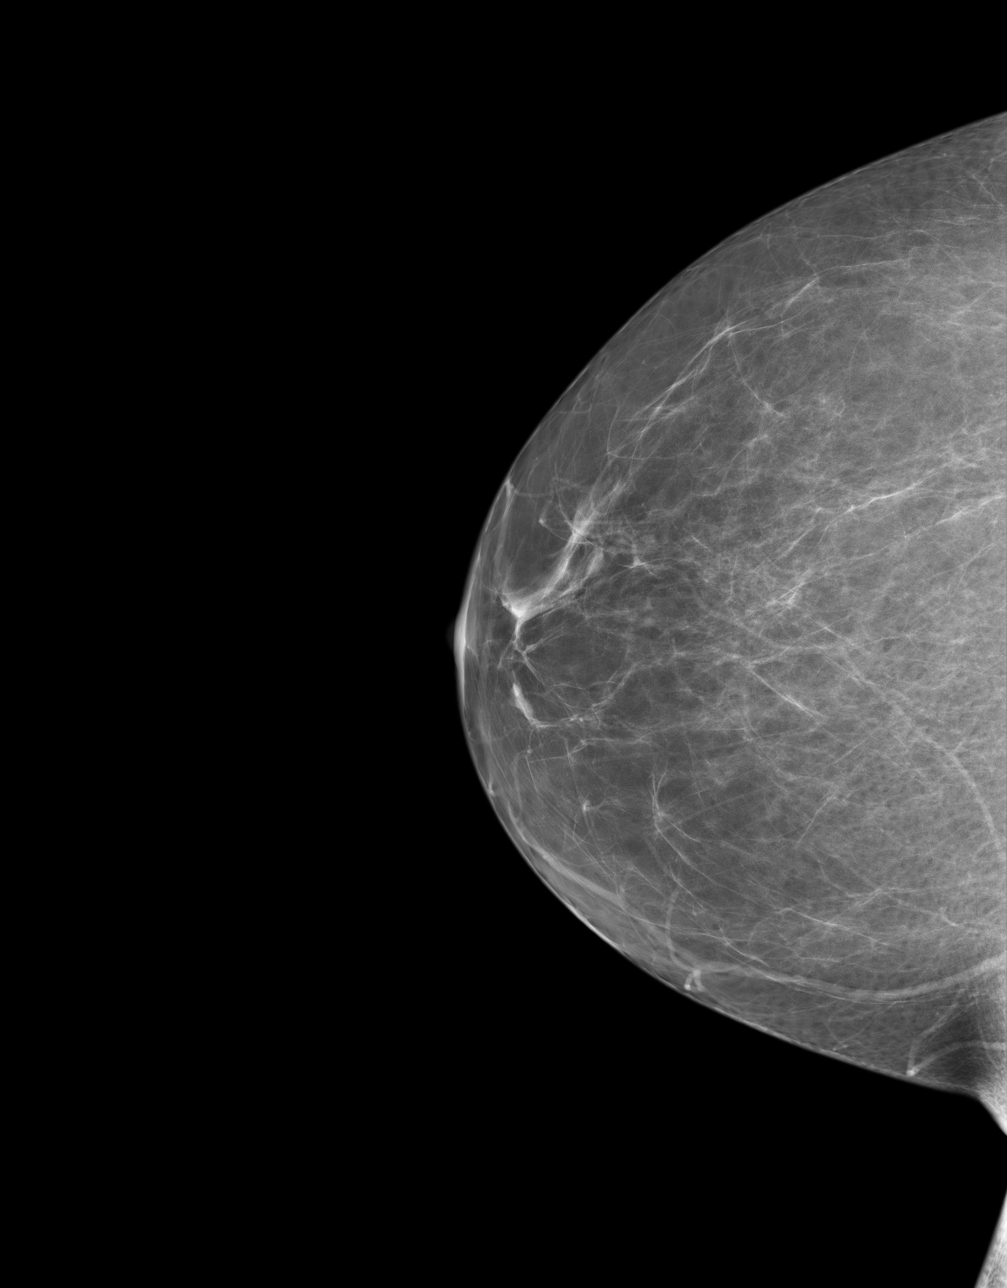
[im 2/4]
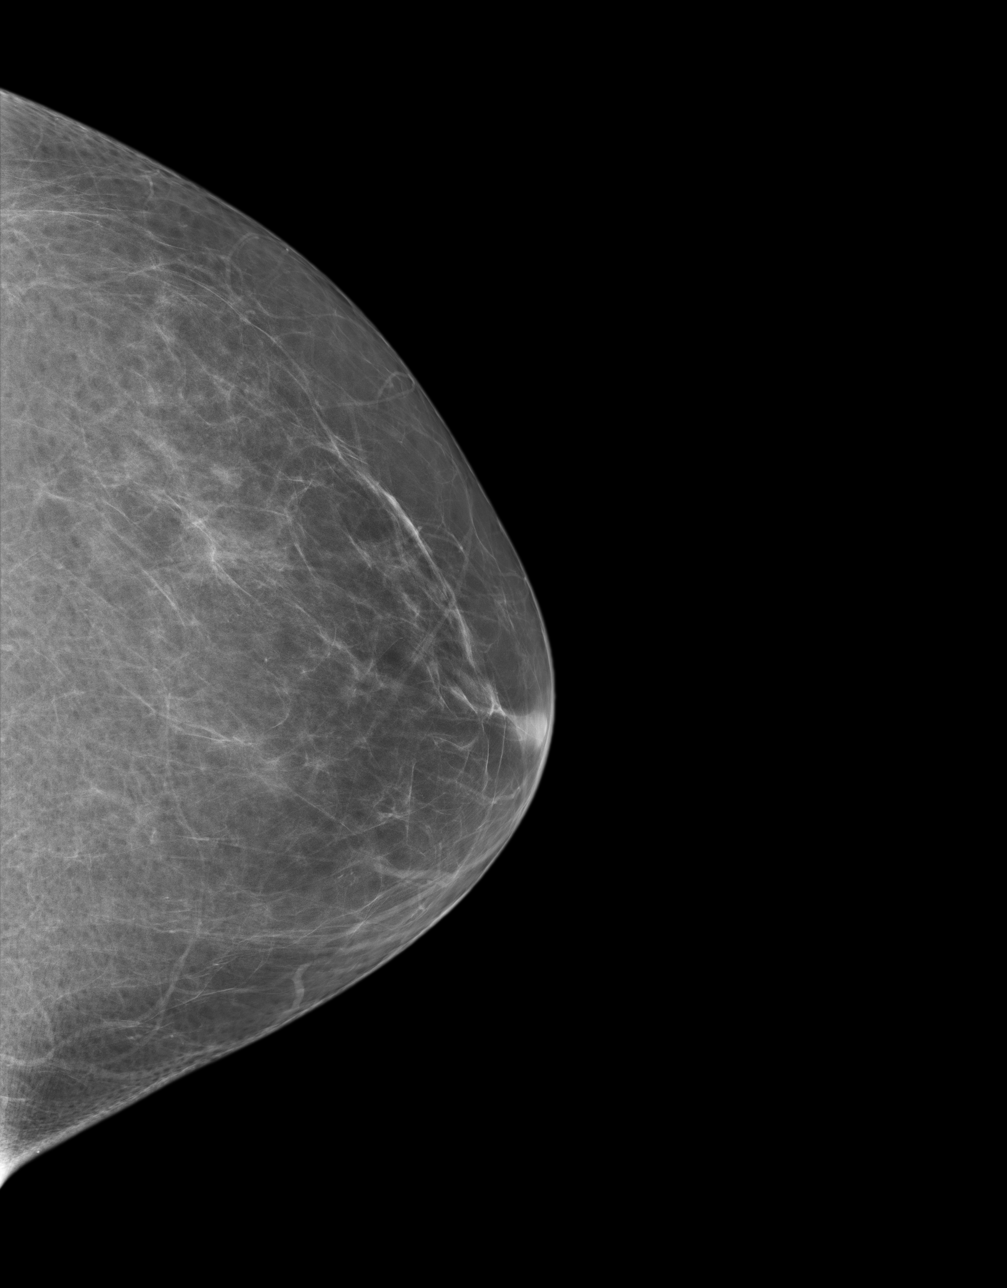
[im 3/4]
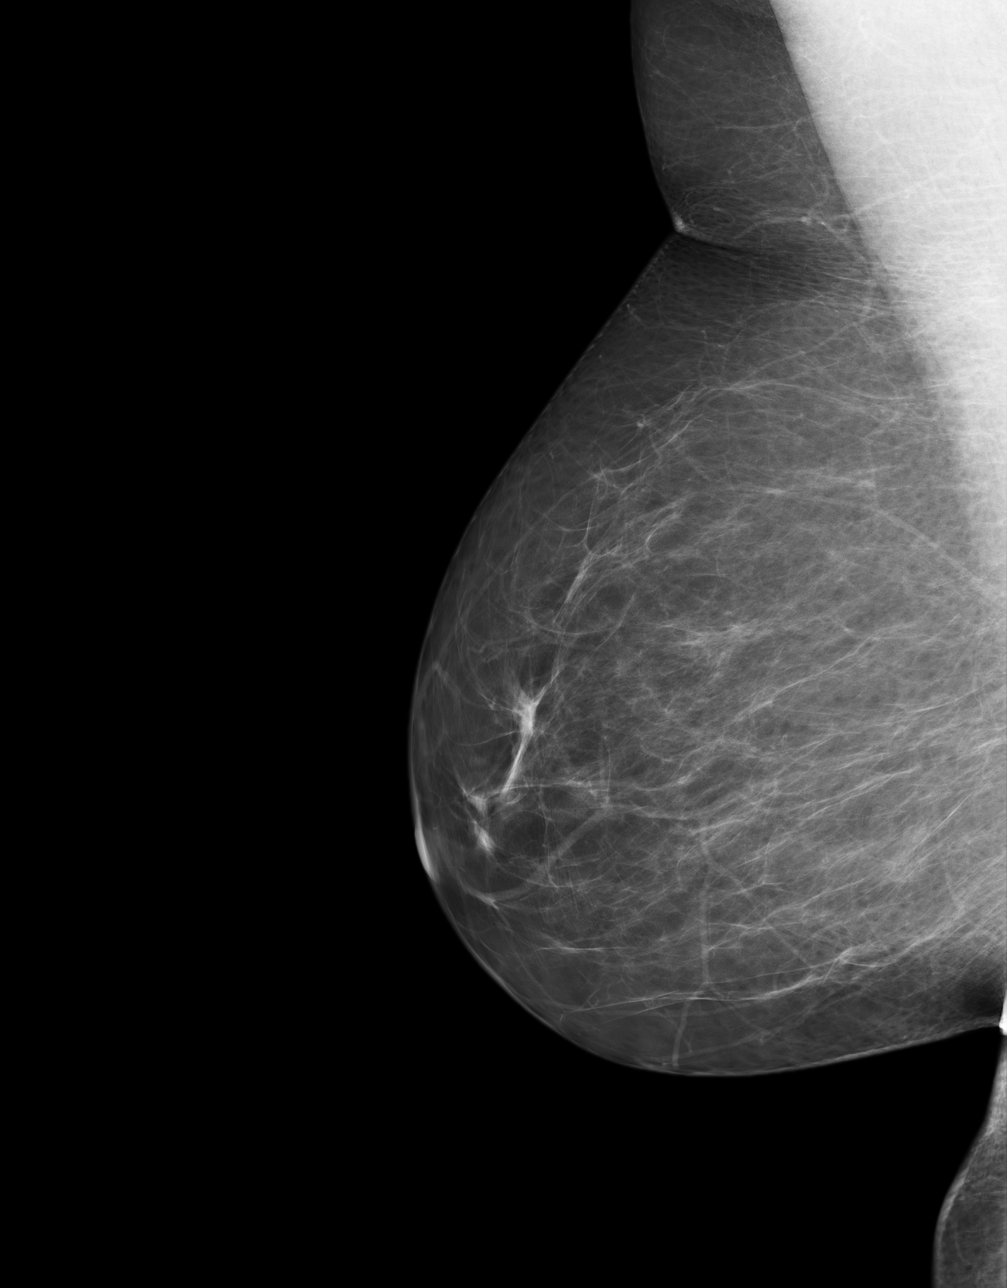
[im 4/4]
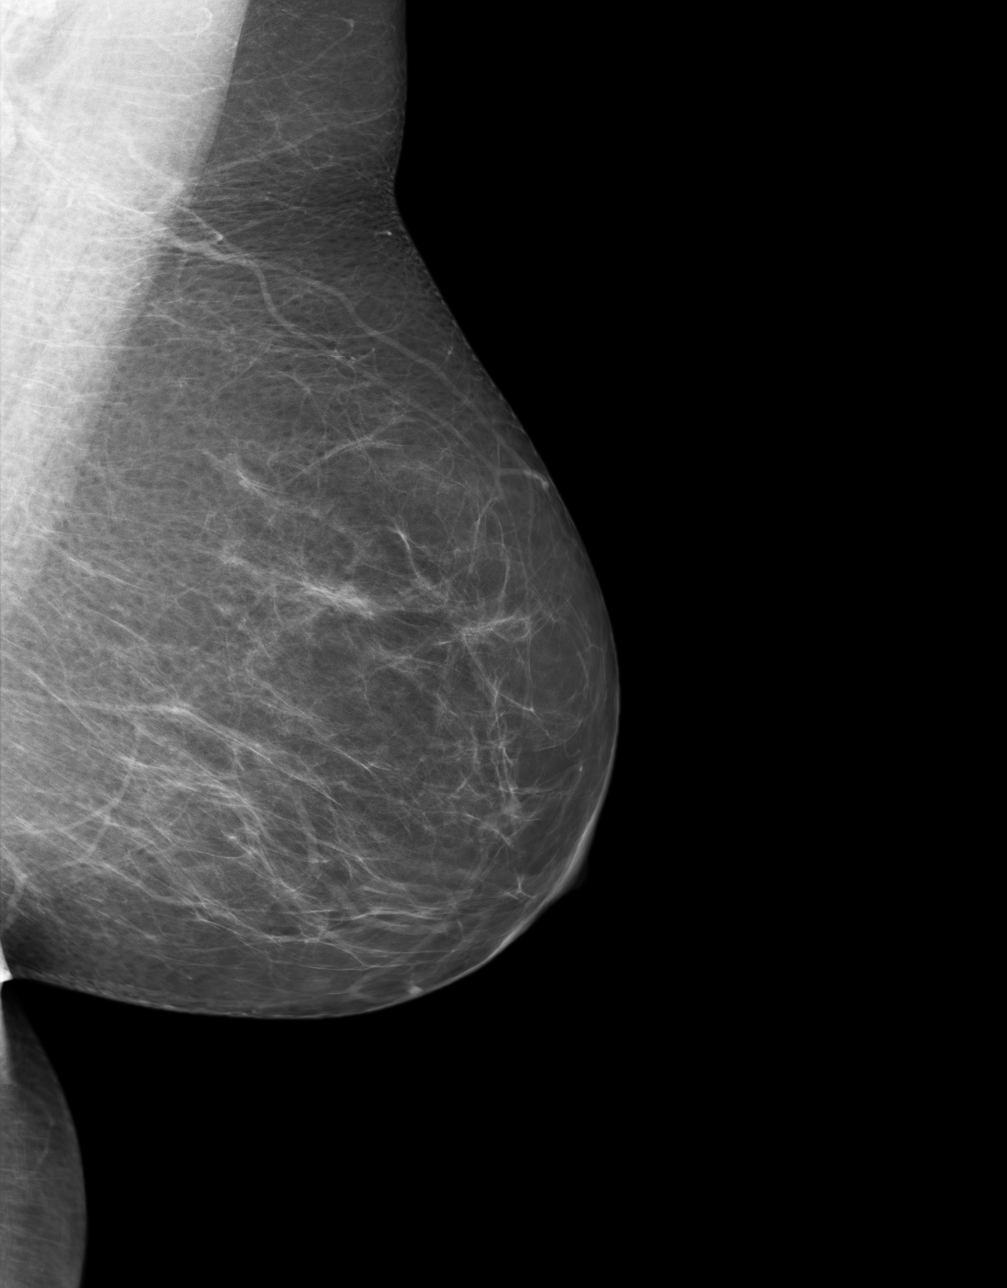

[4 of 4 positions shown; findings below may reference images not displayed]

FINDINGS: BREAST COMPOSITION: The breast composition is SCATTERED FIBROGLANDULAR
TISSUE (glandular tissue is 25-50%)

There is no radiographic evidence of malignant type calcifications ,
architectural distortion, spiculated masses or nodules.
IMPRESSION: BI-RADS: Category 2- Benign Finding

A NEGATIVE MAMMOGRAM REPORT DOES NOT PRECLUDE BIOPSY OR OTHER EVALUATION OF
A CLINICALLY PALPABLE OR OTHERWISE SUSPICIOUS MASS OR LESION. BREAST CANCER
MAY NOT BE DETECTED IN UP TO 10% OF CASES.

## 2015-02-02 ENCOUNTER — Ambulatory Visit (INDEPENDENT_AMBULATORY_CARE_PROVIDER_SITE_OTHER): Payer: BC Managed Care – PPO | Admitting: Internal Medicine

## 2015-02-02 ENCOUNTER — Encounter: Payer: Self-pay | Admitting: Internal Medicine

## 2015-02-02 VITALS — BP 110/70 | HR 96 | Temp 98.1°F | Resp 18 | Ht 63.75 in | Wt 232.0 lb

## 2015-02-02 DIAGNOSIS — Z91048 Other nonmedicinal substance allergy status: Secondary | ICD-10-CM

## 2015-02-02 DIAGNOSIS — G4730 Sleep apnea, unspecified: Secondary | ICD-10-CM | POA: Diagnosis not present

## 2015-02-02 DIAGNOSIS — R110 Nausea: Secondary | ICD-10-CM

## 2015-02-02 DIAGNOSIS — I25118 Atherosclerotic heart disease of native coronary artery with other forms of angina pectoris: Secondary | ICD-10-CM | POA: Diagnosis not present

## 2015-02-02 DIAGNOSIS — D649 Anemia, unspecified: Secondary | ICD-10-CM

## 2015-02-02 DIAGNOSIS — I10 Essential (primary) hypertension: Secondary | ICD-10-CM | POA: Diagnosis not present

## 2015-02-02 DIAGNOSIS — Z9109 Other allergy status, other than to drugs and biological substances: Secondary | ICD-10-CM

## 2015-02-02 DIAGNOSIS — E119 Type 2 diabetes mellitus without complications: Secondary | ICD-10-CM | POA: Diagnosis not present

## 2015-02-02 DIAGNOSIS — E7800 Pure hypercholesterolemia, unspecified: Secondary | ICD-10-CM

## 2015-02-02 NOTE — Progress Notes (Signed)
Patient ID: Evelyn James, female   DOB: 1961-02-06, 54 y.o.   MRN: [id number]   Subjective:    Patient ID: Evelyn James, female    DOB: October 06, 1960, 54 y.o.   MRN: [id number]  HPI  Patient with past history of hypercholesterolemia, diabetes, hypertension, anemia and GERD.  She comes in today to follow up on these issues.  Tries to stay active.  Reports no cardiac symptoms with increased activity or exertion.  No sob.  Does report some intermittent nausea and intermittent loose stool.  Present over the last two months.  Discussed nexium bid.  Discussed probiotics.  No vomiting. Eating and drinking well.  No blood in the stool.     Past Medical History  Diagnosis Date  . Diabetes mellitus   . Hypertension   . Hypercholesterolemia   . Reflux   . TMJ dysfunction   . Anemia     Noted 11/2011  . Chest pain     Admitted 11/2011: cath with mild nonobstructive coronary plaque, normal EF, negative cardiac enzymes and negative d-dimer.   Past Surgical History  Procedure Laterality Date  . Abdominal hysterectomy    . Submandibular mass excision    . Knee surgery    . Bilateral salpingoophorectomy      benign ovarian tumor  . Left heart catheterization with coronary angiogram N/A 12/09/2011    Procedure: LEFT HEART CATHETERIZATION WITH CORONARY ANGIOGRAM;  Surgeon: Minus Breeding, MD;  Location: [facility] CATH LAB;  Service: Cardiovascular;  Laterality: N/A;   Family History  Problem Relation Age of Onset  . Heart disease Father     Father had CABG in his 89s  . Lung cancer Mother   . Colon polyps Father   . Breast cancer Paternal Grandmother   . Arthritis/Rheumatoid Paternal Grandfather    Social History   Social History  . Marital Status: Divorced    Spouse Name: N/A  . Number of Children: 2  . Years of Education: N/A   Social History Main Topics  . Smoking status: Never Smoker   . Smokeless tobacco: Never Used  . Alcohol Use: No  . Drug Use: No  . Sexual Activity: Not Asked    Other Topics Concern  . None   Social History Narrative    Outpatient Encounter Prescriptions as of 02/02/2015  Medication Sig  . BAYER CONTOUR TEST test strip CHECK BLOOD SUGAR TWICE A DAY  . cetirizine (ZYRTEC) 10 MG tablet Take 10 mg by mouth daily.  . cyanocobalamin (,VITAMIN B-12,) 1000 MCG/ML injection Inject 1 mL (1,000 mcg total) into the muscle every 30 (thirty) days.  . cyclobenzaprine (FLEXERIL) 10 MG tablet 1/2 - 1 tablet q pm prn  . escitalopram (LEXAPRO) 10 MG tablet take 2 tablets by mouth once daily  . estrogens, conjugated, (PREMARIN) 0.45 MG tablet Take 1 tablet (0.45 mg total) by mouth daily.  . Fe Fum-FePoly-Vit C-Vit B3 (INTEGRA) 62.5-62.5-40-3 MG CAPS take 1 capsule by mouth twice a day  . fluticasone (FLONASE) 50 MCG/ACT nasal spray Place 2 sprays into both nostrils daily.  Marland Kitchen lisinopril (PRINIVIL,ZESTRIL) 20 MG tablet Take 1 tablet (20 mg total) by mouth daily.  . metFORMIN (GLUCOPHAGE) 1000 MG tablet TAKE 1 TABLET BY MOUTH TWICE A DAY WITH A MEAL  . naproxen sodium (ANAPROX) 220 MG tablet Take 220 mg by mouth daily as needed. For pain  . pantoprazole (PROTONIX) 40 MG tablet TAKE 1 TABLET BY MOUTH EVERY DAY  . pravastatin (PRAVACHOL) 40 MG tablet  take 1 tablet by mouth once daily  . Probiotic Product ([place]) Take by mouth daily as needed.  . [DISCONTINUED] sitaGLIPtin (JANUVIA) 100 MG tablet Take 1 tablet (100 mg total) by mouth daily.   No facility-administered encounter medications on file as of 02/02/2015.    Review of Systems  Constitutional: Negative for appetite change and unexpected weight change.  HENT: Negative for congestion and sinus pressure.   Eyes: Negative for pain and discharge.  Respiratory: Negative for cough, chest tightness and shortness of breath.   Cardiovascular: Negative for chest pain, palpitations and leg swelling.  Gastrointestinal:       Intermittent loose stool and nausea as outlined.  No vomiting.  No blood  in the stool.    Genitourinary: Negative for dysuria and difficulty urinating.  Musculoskeletal: Negative for back pain and joint swelling.  Skin: Negative for color change and rash.  Neurological: Negative for dizziness, light-headedness and headaches.  Psychiatric/Behavioral: Negative for dysphoric mood and agitation.       Objective:    Physical Exam  Constitutional: She appears well-developed and well-nourished. No distress.  HENT:  Nose: Nose normal.  Mouth/Throat: Oropharynx is clear and moist.  Eyes: Conjunctivae are normal. Right eye exhibits no discharge. Left eye exhibits no discharge.  Neck: Neck supple. No thyromegaly present.  Cardiovascular: Normal rate and regular rhythm.   Pulmonary/Chest: Breath sounds normal. No respiratory distress. She has no wheezes.  Abdominal: Soft. Bowel sounds are normal. There is no tenderness.  Musculoskeletal: She exhibits no edema or tenderness.  Lymphadenopathy:    She has no cervical adenopathy.  Skin: No rash noted. No erythema.  Psychiatric: She has a normal mood and affect. Her behavior is normal.    BP 110/70 mmHg  Pulse 96  Temp(Src) 98.1 F (36.7 C) (Oral)  Resp 18  Ht 5' 3.75" (1.619 m)  Wt 232 lb (105.235 kg)  BMI 40.15 kg/m2  SpO2 97% Wt Readings from Last 3 Encounters:  02/02/15 232 lb (105.235 kg)  11/04/14 234 lb (106.142 kg)  09/18/14 237 lb 12.8 oz (107.865 kg)     Lab Results  Component Value Date   WBC 11.1* 01/29/2015   HGB 11.9* 01/29/2015   HCT 37.0 01/29/2015   PLT 228.0 01/29/2015   GLUCOSE 131* 01/29/2015   CHOL 159 01/29/2015   TRIG 137.0 01/29/2015   HDL 57.60 01/29/2015   LDLDIRECT 97.0 10/31/2014   LDLCALC 74 01/29/2015   ALT 26 01/29/2015   AST 25 01/29/2015   NA 139 01/29/2015   K 4.5 01/29/2015   CL 100 01/29/2015   CREATININE 0.60 01/29/2015   BUN 11 01/29/2015   CO2 29 01/29/2015   TSH 2.32 10/31/2014   HGBA1C 7.4* 01/29/2015   MICROALBUR 2.6* 10/31/2014        Assessment & Plan:   Problem List Items Addressed This Visit    Anemia    EGD and colonoscopy 05/20/13 as outlined.  Recommended f/u colonoscopy in 10 years.  hgb 11.9 01/29/15.  Follow cbc.        Relevant Orders   CBC with Differential/Platelet   Ferritin   CAD (coronary artery disease)    Asymptomatic.  Continue risk factor modification.  Stable.        Diabetes mellitus ([place])    Low carb diet and exercise.  Discussed diet and exercise.  Follow met b and a1c.  Discussed adding medication.  She wants to get more serious about her sugars and diet adjustment.  Lab Results  Component Value Date   HGBA1C 7.4* 01/29/2015        Relevant Orders   Hemoglobin A1c   Environmental allergies    Stable.       Hypercholesterolemia    On pravastatin.  Low cholesterol diet and exercise.  Follow lipid panel and liver function tests.        Relevant Orders   Lipid panel   Hepatic function panel   Hypertension - Primary    Blood pressure under good control.  Continue same medication regimen.  Follow pressures.  Follow metabolic panel.        Relevant Orders   Basic metabolic panel   Nausea    Nausea and loose stool as outlined.  Discussed nexium bid.  Probiotic daily.  Discussed further w/up.  She declines.  Will update me on symptoms.        Severe obesity (BMI >= 40) (H[facility])    Discussed diet and exercise.        Sleep apnea    Continue CPAP.           Einar Pheasant, MD

## 2015-02-02 NOTE — Progress Notes (Signed)
Pre-visit discussion using our clinic review tool. No additional management support is needed unless otherwise documented below in the visit note.  

## 2015-02-08 ENCOUNTER — Encounter: Payer: Self-pay | Admitting: Internal Medicine

## 2015-02-08 DIAGNOSIS — R110 Nausea: Secondary | ICD-10-CM | POA: Insufficient documentation

## 2015-02-08 NOTE — Assessment & Plan Note (Signed)
Stable

## 2015-02-08 NOTE — Assessment & Plan Note (Signed)
Blood pressure under good control.  Continue same medication regimen.  Follow pressures.  Follow metabolic panel.   

## 2015-02-08 NOTE — Assessment & Plan Note (Signed)
Nausea and loose stool as outlined.  Discussed nexium bid.  Probiotic daily.  Discussed further w/up.  She declines.  Will update me on symptoms.

## 2015-02-08 NOTE — Assessment & Plan Note (Signed)
Continue CPAP.  

## 2015-02-08 NOTE — Assessment & Plan Note (Signed)
EGD and colonoscopy 05/20/13 as outlined.  Recommended f/u colonoscopy in 10 years.  hgb 11.9 01/29/15.  Follow cbc.

## 2015-02-08 NOTE — Assessment & Plan Note (Signed)
Asymptomatic.  Continue risk factor modification.  Stable.

## 2015-02-08 NOTE — Assessment & Plan Note (Signed)
Discussed diet and exercise 

## 2015-02-08 NOTE — Assessment & Plan Note (Signed)
Low carb diet and exercise.  Discussed diet and exercise.  Follow met b and a1c.  Discussed adding medication.  She wants to get more serious about her sugars and diet adjustment.   Lab Results  Component Value Date   HGBA1C 7.4* 01/29/2015

## 2015-02-08 NOTE — Assessment & Plan Note (Signed)
On pravastatin.  Low cholesterol diet and exercise.  Follow lipid panel and liver function tests.   

## 2015-02-27 ENCOUNTER — Other Ambulatory Visit: Payer: Self-pay | Admitting: Internal Medicine

## 2015-03-19 ENCOUNTER — Other Ambulatory Visit: Payer: Self-pay | Admitting: Internal Medicine

## 2015-04-10 ENCOUNTER — Other Ambulatory Visit: Payer: Self-pay | Admitting: Internal Medicine

## 2015-04-13 NOTE — Telephone Encounter (Signed)
ok'd refill premarin #[phone] with 2 refills.

## 2015-04-13 NOTE — Telephone Encounter (Signed)
Patient last seen in November and medication was refilled in august. Please advise?

## 2015-04-22 ENCOUNTER — Other Ambulatory Visit: Payer: Self-pay | Admitting: Internal Medicine

## 2015-05-12 ENCOUNTER — Ambulatory Visit (INDEPENDENT_AMBULATORY_CARE_PROVIDER_SITE_OTHER): Payer: BC Managed Care – PPO | Admitting: Internal Medicine

## 2015-05-12 ENCOUNTER — Other Ambulatory Visit: Payer: Self-pay | Admitting: Internal Medicine

## 2015-05-12 ENCOUNTER — Encounter: Payer: Self-pay | Admitting: Internal Medicine

## 2015-05-12 VITALS — BP 124/82 | HR 99 | Temp 99.0°F | Wt 231.0 lb

## 2015-05-12 DIAGNOSIS — J309 Allergic rhinitis, unspecified: Secondary | ICD-10-CM

## 2015-05-12 DIAGNOSIS — S0300XA Dislocation of jaw, unspecified side, initial encounter: Secondary | ICD-10-CM

## 2015-05-12 MED ORDER — PREDNISONE 10 MG PO TABS: ORAL_TABLET | ORAL | Status: DC

## 2015-05-12 NOTE — Progress Notes (Signed)
Pre visit review using our clinic review tool, if applicable. No additional management support is needed unless otherwise documented below in the visit note. 

## 2015-05-12 NOTE — Progress Notes (Signed)
HPI  Pt presents to the clinic today with c/o left side facial pain and pressure, tooth pain, nasal congestion, sore throat and cough. This started 1 month ago. She is blowing blood tinged mucous out of her nose. The cough is productive of yellow mucous at times. She denies fever, chills or body aches but has felt fatigue. She has tried Mucinex D, Zyrtec and Flonase with some relief. She has no history of seasonal allergies or breathing problems.  Review of Systems    Past Medical History  Diagnosis Date  . Diabetes mellitus   . Hypertension   . Hypercholesterolemia   . Reflux   . TMJ dysfunction   . Anemia     Noted 11/2011  . Chest pain     Admitted 11/2011: cath with mild nonobstructive coronary plaque, normal EF, negative cardiac enzymes and negative d-dimer.    Family History  Problem Relation Age of Onset  . Heart disease Father     Father had CABG in his 64s  . Lung cancer Mother   . Colon polyps Father   . Breast cancer Paternal Grandmother   . Arthritis/Rheumatoid Paternal Grandfather     Social History   Social History  . Marital Status: Divorced    Spouse Name: N/A  . Number of Children: 2  . Years of Education: N/A   Occupational History  . Not on file.   Social History Main Topics  . Smoking status: Never Smoker   . Smokeless tobacco: Never Used  . Alcohol Use: No  . Drug Use: No  . Sexual Activity: Not on file   Other Topics Concern  . Not on file   Social History Narrative    Allergies  Allergen Reactions  . Sulfa Antibiotics Other (See Comments)    Topical--red itchy; oral--makes mouth raw     Constitutional:  Denies headache, fatigue, fever or abrupt weight changes.  HEENT:  Positive facial pain, nasal congestion and sore throat. Denies eye redness, ear pain, ringing in the ears, wax buildup, runny nose or bloody nose. Respiratory: Positive cough. Denies difficulty breathing or shortness of breath.  Cardiovascular: Denies chest pain,  chest tightness, palpitations or swelling in the hands or feet.   No other specific complaints in a complete review of systems (except as listed in HPI above).  Objective:  BP 124/82 mmHg  Pulse 99  Temp(Src) 99 F (37.2 C) (Oral)  Wt 231 lb (104.781 kg)  SpO2 98%   General: Appears her stated age, in NAD. HEENT: Head: normal shape and size, left maxillary sinus tenderness noted; Eyes: sclera white, no icterus, conjunctiva pink; Ears: Tm's gray and intact, normal light reflex; Nose: mucosa pink and moist, septum midline; Throat/Mouth: + PND. Teeth present, mucosa pink and moist, no exudate noted, no lesions or ulcerations noted. TMJ click noted on left. Neck:  No cervical adenopathy noted.  Cardiovascular: Normal rate and rhythm. S1,S2 noted.  No murmur, rubs or gallops noted.  Pulmonary/Chest: Normal effort and positive vesicular breath sounds. No respiratory distress. No wheezes, rales or ronchi noted.      Assessment & Plan:   Allergic Rhinitis  Can use a Neti Pot which can be purchased from your local drug store. Flonase 2 sprays each nostril for 3 days and then as needed. Zyrtec OTC as needed  TMJ, left:  eRx for Pred Taper x 6 days- monitor sugar levels No Aleve or Ibuprofen, Tylenol as needed for pain   RTC as needed or if symptoms  persist.

## 2015-05-12 NOTE — Patient Instructions (Signed)

## 2015-05-25 ENCOUNTER — Encounter: Payer: Self-pay | Admitting: Internal Medicine

## 2015-06-10 ENCOUNTER — Other Ambulatory Visit: Payer: Self-pay | Admitting: Internal Medicine

## 2015-07-07 ENCOUNTER — Other Ambulatory Visit (INDEPENDENT_AMBULATORY_CARE_PROVIDER_SITE_OTHER): Payer: BC Managed Care – PPO

## 2015-07-07 DIAGNOSIS — E7800 Pure hypercholesterolemia, unspecified: Secondary | ICD-10-CM

## 2015-07-07 DIAGNOSIS — I10 Essential (primary) hypertension: Secondary | ICD-10-CM | POA: Diagnosis not present

## 2015-07-07 DIAGNOSIS — D649 Anemia, unspecified: Secondary | ICD-10-CM | POA: Diagnosis not present

## 2015-07-07 DIAGNOSIS — E119 Type 2 diabetes mellitus without complications: Secondary | ICD-10-CM | POA: Diagnosis not present

## 2015-07-07 LAB — LIPID PANEL
CHOLESTEROL: 169 mg/dL (ref 0–200)
HDL: 58.7 mg/dL (ref >39.00)
LDL Cholesterol: 71 mg/dL (ref 0–99)
NONHDL: 110.62
TRIGLYCERIDES: 197 mg/dL — AB (ref 0.0–149.0)
Total CHOL/HDL Ratio: 3
VLDL: 39.4 mg/dL (ref 0.0–40.0)

## 2015-07-07 LAB — CBC WITH DIFFERENTIAL/PLATELET
BASOS ABS: 0 10*3/uL (ref 0.0–0.1)
BASOS PCT: 0.3 % (ref 0.0–3.0)
EOS ABS: 0.2 10*3/uL (ref 0.0–0.7)
Eosinophils Relative: 2.4 % (ref 0.0–5.0)
HCT: 37.2 % (ref 36.0–46.0)
HEMOGLOBIN: 12 g/dL (ref 12.0–15.0)
LYMPHS PCT: 30.8 % (ref 12.0–46.0)
Lymphs Abs: 3.2 10*3/uL (ref 0.7–4.0)
MCHC: 32.4 g/dL (ref 30.0–36.0)
MCV: 83.1 fl (ref 78.0–100.0)
MONOS PCT: 5.7 % (ref 3.0–12.0)
Monocytes Absolute: 0.6 10*3/uL (ref 0.1–1.0)
NEUTROS ABS: 6.2 10*3/uL (ref 1.4–7.7)
NEUTROS PCT: 60.8 % (ref 43.0–77.0)
Platelets: 207 10*3/uL (ref 150.0–400.0)
RBC: 4.47 Mil/uL (ref 3.87–5.11)
RDW: 14.8 % (ref 11.5–15.5)
WBC: 10.3 10*3/uL (ref 4.0–10.5)

## 2015-07-07 LAB — HEPATIC FUNCTION PANEL
ALBUMIN: 4 g/dL (ref 3.5–5.2)
ALK PHOS: 66 U/L (ref 39–117)
ALT: 41 U/L — AB (ref 0–35)
AST: 34 U/L (ref 0–37)
Bilirubin, Direct: 0.1 mg/dL (ref 0.0–0.3)
Total Bilirubin: 0.3 mg/dL (ref 0.2–1.2)
Total Protein: 7 g/dL (ref 6.0–8.3)

## 2015-07-07 LAB — BASIC METABOLIC PANEL
BUN: 11 mg/dL (ref 6–23)
CALCIUM: 9.5 mg/dL (ref 8.4–10.5)
CHLORIDE: 101 meq/L (ref 96–112)
CO2: 30 mEq/L (ref 19–32)
CREATININE: 0.54 mg/dL (ref 0.40–1.20)
GFR: 123.97 mL/min (ref >60.00)
Glucose, Bld: 156 mg/dL — ABNORMAL HIGH (ref 70–99)
Potassium: 4.3 mEq/L (ref 3.5–5.1)
Sodium: 141 mEq/L (ref 135–145)

## 2015-07-07 LAB — FERRITIN: FERRITIN: 44.5 ng/mL (ref 10.0–291.0)

## 2015-07-07 LAB — HEMOGLOBIN A1C: HEMOGLOBIN A1C: 8.3 % — AB (ref 4.6–6.5)

## 2015-07-08 ENCOUNTER — Encounter: Payer: Self-pay | Admitting: Internal Medicine

## 2015-07-09 ENCOUNTER — Ambulatory Visit (INDEPENDENT_AMBULATORY_CARE_PROVIDER_SITE_OTHER): Payer: BC Managed Care – PPO | Admitting: Internal Medicine

## 2015-07-09 ENCOUNTER — Encounter: Payer: Self-pay | Admitting: Internal Medicine

## 2015-07-09 VITALS — BP 110/70 | HR 82 | Temp 98.2°F | Resp 18 | Ht 64.0 in | Wt 236.0 lb

## 2015-07-09 DIAGNOSIS — E119 Type 2 diabetes mellitus without complications: Secondary | ICD-10-CM

## 2015-07-09 DIAGNOSIS — R51 Headache: Secondary | ICD-10-CM

## 2015-07-09 DIAGNOSIS — G4452 New daily persistent headache (NDPH): Secondary | ICD-10-CM

## 2015-07-09 DIAGNOSIS — R6889 Other general symptoms and signs: Secondary | ICD-10-CM | POA: Diagnosis not present

## 2015-07-09 DIAGNOSIS — E7800 Pure hypercholesterolemia, unspecified: Secondary | ICD-10-CM

## 2015-07-09 DIAGNOSIS — Z0000 Encounter for general adult medical examination without abnormal findings: Secondary | ICD-10-CM

## 2015-07-09 DIAGNOSIS — M25552 Pain in left hip: Secondary | ICD-10-CM

## 2015-07-09 DIAGNOSIS — Z1239 Encounter for other screening for malignant neoplasm of breast: Secondary | ICD-10-CM

## 2015-07-09 DIAGNOSIS — R1013 Epigastric pain: Secondary | ICD-10-CM

## 2015-07-09 DIAGNOSIS — R519 Headache, unspecified: Secondary | ICD-10-CM | POA: Insufficient documentation

## 2015-07-09 DIAGNOSIS — I25118 Atherosclerotic heart disease of native coronary artery with other forms of angina pectoris: Secondary | ICD-10-CM

## 2015-07-09 DIAGNOSIS — I10 Essential (primary) hypertension: Secondary | ICD-10-CM

## 2015-07-09 DIAGNOSIS — E538 Deficiency of other specified B group vitamins: Secondary | ICD-10-CM | POA: Diagnosis not present

## 2015-07-09 DIAGNOSIS — Z0001 Encounter for general adult medical examination with abnormal findings: Secondary | ICD-10-CM

## 2015-07-09 LAB — HM DIABETES FOOT EXAM

## 2015-07-09 MED ORDER — CYCLOBENZAPRINE HCL 10 MG PO TABS: ORAL_TABLET | ORAL | Status: DC

## 2015-07-09 MED ORDER — LORAZEPAM 0.5 MG PO TABS: ORAL_TABLET | ORAL | Status: DC

## 2015-07-09 NOTE — Progress Notes (Signed)
Pre-visit discussion using our clinic review tool. No additional management support is needed unless otherwise documented below in the visit note.  

## 2015-07-12 ENCOUNTER — Encounter: Payer: Self-pay | Admitting: Internal Medicine

## 2015-07-12 DIAGNOSIS — M25552 Pain in left hip: Secondary | ICD-10-CM | POA: Insufficient documentation

## 2015-07-12 DIAGNOSIS — R1013 Epigastric pain: Secondary | ICD-10-CM | POA: Insufficient documentation

## 2015-07-12 NOTE — Assessment & Plan Note (Signed)
Protonix.  Monitor for triggers.  Check liver panel.  Desires no further intervention.  Follow.

## 2015-07-12 NOTE — Assessment & Plan Note (Signed)
Blood pressure under good control.  Continue same medication regimen.  Follow pressures.  Follow metabolic panel.   

## 2015-07-12 NOTE — Assessment & Plan Note (Signed)
Low carb diet and exercise.  Follow met b and a1c.  Discussed changing medication.  She is going to check and record blood sugars and send in readings.  Wants to hold on endocrinology referral.  Review sugars.  Adjust medication as needed.  Follow met b and a1c.  Recent check 8.3.   

## 2015-07-12 NOTE — Assessment & Plan Note (Signed)
Physical today 07/09/14.  Colonoscopy 05/04/14.  mammmogram 05/06/14 - [facility] I.  She will schedule her mammogram.

## 2015-07-12 NOTE — Assessment & Plan Note (Signed)
Asymptomatic.  Continue risk factor modification.    

## 2015-07-12 NOTE — Assessment & Plan Note (Signed)
Discussed diet and exercise.  Follow.  

## 2015-07-12 NOTE — Assessment & Plan Note (Signed)
Headache persistent - daily.  Does not usually have headaches.  No sinus issues.  No vision change.  Persistent headache.  Check esr.  MRI brain.  Further w/up pending.

## 2015-07-12 NOTE — Assessment & Plan Note (Signed)
Tylenol.  Desires no further intervention.  Follow.

## 2015-07-12 NOTE — Progress Notes (Signed)
Patient ID: Evelyn James, female   DOB: Jul 15, 1960, 55 y.o.   MRN: [id number]   Subjective:    Patient ID: Evelyn James, female    DOB: 1960/04/10, 55 y.o.   MRN: [id number]  HPI  Patient here for her physical exam.  She has not been checking her sugars regularly.  Taking her medication.  Not watching her diet.  States her blood sugar yesterday am was 152 and last evening 198.  Not exercising.  Discussed diet and exercise.  She states she does plan to get more serious about her diet and exercise.  Had some issues with TMJ.  Is better.  She does report headache over the last two months.  Persistent daily headache now.  No sinus issues.  No vision changes.  Usually does not have headaches.  No chest pain or tightness.  No sob.  No acid reflux.  No abdominal pain or cramping.  She does report some loose bowels at times.  No blood reported.  Some epigastric discomfort.  Also some left hip pain.  No pain radiating down her leg.  Discussed lab results.     Past Medical History  Diagnosis Date  . Diabetes mellitus   . Hypertension   . Hypercholesterolemia   . Reflux   . TMJ dysfunction   . Anemia     Noted 11/2011  . Chest pain     Admitted 11/2011: cath with mild nonobstructive coronary plaque, normal EF, negative cardiac enzymes and negative d-dimer.   Past Surgical History  Procedure Laterality Date  . Abdominal hysterectomy    . Submandibular mass excision    . Knee surgery    . Bilateral salpingoophorectomy      benign ovarian tumor  . Left heart catheterization with coronary angiogram N/A 12/09/2011    Procedure: LEFT HEART CATHETERIZATION WITH CORONARY ANGIOGRAM;  Surgeon: Minus Breeding, MD;  Location: [facility] CATH LAB;  Service: Cardiovascular;  Laterality: N/A;   Family History  Problem Relation Age of Onset  . Heart disease Father     Father had CABG in his 68s  . Lung cancer Mother   . Colon polyps Father   . Breast cancer Paternal Grandmother   . Arthritis/Rheumatoid Paternal  Grandfather    Social History   Social History  . Marital Status: Divorced    Spouse Name: N/A  . Number of Children: 2  . Years of Education: N/A   Social History Main Topics  . Smoking status: Never Smoker   . Smokeless tobacco: Never Used  . Alcohol Use: No  . Drug Use: No  . Sexual Activity: Not Asked   Other Topics Concern  . None   Social History Narrative    Outpatient Encounter Prescriptions as of 07/09/2015  Medication Sig  . BAYER CONTOUR TEST test strip CHECK BLOOD SUGAR TWICE A DAY  . cetirizine (ZYRTEC) 10 MG tablet Take 10 mg by mouth daily.  . cyanocobalamin (,VITAMIN B-12,) 1000 MCG/ML injection INJECT 1 MILLILITER INTO THE MUSCLE EVERY MONTH.  . cyclobenzaprine (FLEXERIL) 10 MG tablet 1/2 - 1 tablet q pm prn  . escitalopram (LEXAPRO) 10 MG tablet take 2 tablets by mouth once daily  . Fe Fum-FePoly-Vit C-Vit B3 (INTEGRA) 62.5-62.5-40-3 MG CAPS take 1 capsule by mouth twice a day  . fluticasone (FLONASE) 50 MCG/ACT nasal spray Place 2 sprays into both nostrils daily.  Marland Kitchen JANUVIA 100 MG tablet take 1 tablet by mouth once daily  . lisinopril (PRINIVIL,ZESTRIL) 20 MG tablet take  1 tablet by mouth once daily  . metFORMIN (GLUCOPHAGE) 1000 MG tablet TAKE 1 TABLET BY MOUTH TWICE A DAY WITH A MEAL  . naproxen sodium (ANAPROX) 220 MG tablet Take 220 mg by mouth daily as needed. For pain  . pantoprazole (PROTONIX) 40 MG tablet TAKE 1 TABLET BY MOUTH EVERY DAY  . pravastatin (PRAVACHOL) 40 MG tablet take 1 tablet by mouth once daily  . PREMARIN 0.45 MG tablet take 1 tablet by mouth once daily  . Probiotic Product ([place]) Take by mouth daily as needed.  . [DISCONTINUED] cyclobenzaprine (FLEXERIL) 10 MG tablet 1/2 - 1 tablet q pm prn  . [DISCONTINUED] predniSONE (DELTASONE) 10 MG tablet Take 3 tabs on days 1-2, take 2 tabs on days 3-4, take 1 tab on days 5-6  . LORazepam (ATIVAN) 0.5 MG tablet Take 1/2 tablet prior to procedure.  May repeat x 1 if needed.    No facility-administered encounter medications on file as of 07/09/2015.    Review of Systems  Constitutional: Negative for appetite change and unexpected weight change.  HENT: Negative for congestion and sinus pressure.   Eyes: Negative for pain and visual disturbance.  Respiratory: Negative for cough, chest tightness and shortness of breath.   Cardiovascular: Negative for chest pain, palpitations and leg swelling.  Gastrointestinal: Positive for diarrhea. Negative for nausea, vomiting and abdominal pain.  Genitourinary: Negative for dysuria and difficulty urinating.  Musculoskeletal: Negative for back pain.       Left hip pain as outlined.   Skin: Negative for color change and rash.  Neurological: Positive for headaches. Negative for dizziness and light-headedness.  Hematological: Negative for adenopathy. Does not bruise/bleed easily.  Psychiatric/Behavioral: Negative for dysphoric mood and agitation.       Objective:    Physical Exam  Constitutional: She is oriented to person, place, and time. She appears well-developed and well-nourished. No distress.  HENT:  Nose: Nose normal.  Mouth/Throat: Oropharynx is clear and moist.  Eyes: Right eye exhibits no discharge. Left eye exhibits no discharge. No scleral icterus.  Neck: Neck supple. No thyromegaly present.  Cardiovascular: Normal rate and regular rhythm.   Pulmonary/Chest: Breath sounds normal. No accessory muscle usage. No tachypnea. No respiratory distress. She has no decreased breath sounds. She has no wheezes. She has no rhonchi. Right breast exhibits no inverted nipple, no mass, no nipple discharge and no tenderness (no axillary adenopathy). Left breast exhibits no inverted nipple, no mass, no nipple discharge and no tenderness (no axilarry adenopathy).  Abdominal: Soft. Bowel sounds are normal. There is no tenderness.  Musculoskeletal: She exhibits no edema or tenderness.  Lymphadenopathy:    She has no cervical  adenopathy.  Neurological: She is alert and oriented to person, place, and time.  Skin: Skin is warm. No rash noted. No erythema.  Psychiatric: She has a normal mood and affect. Her behavior is normal.    BP 110/70 mmHg  Pulse 82  Temp(Src) 98.2 F (36.8 C) (Oral)  Resp 18  Ht 5' 4"  (1.626 m)  Wt 236 lb (107.049 kg)  BMI 40.49 kg/m2  SpO2 94% Wt Readings from Last 3 Encounters:  07/09/15 236 lb (107.049 kg)  05/12/15 231 lb (104.781 kg)  02/02/15 232 lb (105.235 kg)     Lab Results  Component Value Date   WBC 10.3 07/07/2015   HGB 12.0 07/07/2015   HCT 37.2 07/07/2015   PLT 207.0 07/07/2015   GLUCOSE 156* 07/07/2015   CHOL 169 07/07/2015  TRIG 197.0* 07/07/2015   HDL 58.70 07/07/2015   LDLDIRECT 97.0 10/31/2014   LDLCALC 71 07/07/2015   ALT 41* 07/07/2015   AST 34 07/07/2015   NA 141 07/07/2015   K 4.3 07/07/2015   CL 101 07/07/2015   CREATININE 0.54 07/07/2015   BUN 11 07/07/2015   CO2 30 07/07/2015   TSH 2.32 10/31/2014   HGBA1C 8.3* 07/07/2015   MICROALBUR 2.6* 10/31/2014        Assessment & Plan:   Problem List Items Addressed This Visit    CAD (coronary artery disease)    Asymptomatic.  Continue risk factor modification.        Diabetes mellitus ([place])    Low carb diet and exercise.  Follow met b and a1c.  Discussed changing medication.  She is going to check and record blood sugars and send in readings.  Wants to hold on endocrinology referral.  Review sugars.  Adjust medication as needed.  Follow met b and a1c.  Recent check 8.3.        Epigastric pain    Protonix.  Monitor for triggers.  Check liver panel.  Desires no further intervention.  Follow.       Headache    Headache persistent - daily.  Does not usually have headaches.  No sinus issues.  No vision change.  Persistent headache.  Check esr.  MRI brain.  Further w/up pending.        Relevant Medications   cyclobenzaprine (FLEXERIL) 10 MG tablet   Other Relevant Orders   Hepatic  function panel   Sedimentation rate   MR Brain W Wo Contrast   Health care maintenance    Physical today 07/09/14.  Colonoscopy 05/04/14.  mammmogram 05/06/14 - [facility] I.  She will schedule her mammogram.        Hypercholesterolemia    On pravastatin.  Low cholesterol diet and exercise.  Follow lipid panel and liver function tests.  LDL just checked 71.       Hypertension    Blood pressure under good control.  Continue same medication regimen.  Follow pressures.  Follow metabolic panel.        Left hip pain    Tylenol.  Desires no further intervention.  Follow.       Severe obesity (BMI >= 40) (HCC)    Discussed diet and exercise.  Follow.        Other Visit Diagnoses    Encounter for general adult medical examination with abnormal findings    -  Primary    Screening breast examination        Relevant Orders    MM DIGITAL SCREENING BILATERAL    B12 deficiency        Relevant Orders    Vitamin B12      I spent 25 minutes with the patient and more than 50% of the time was spent in consultation regarding the above.     Einar Pheasant, MD

## 2015-07-12 NOTE — Assessment & Plan Note (Signed)
On pravastatin.  Low cholesterol diet and exercise.  Follow lipid panel and liver function tests.  LDL just checked 71.

## 2015-07-13 ENCOUNTER — Other Ambulatory Visit: Payer: Self-pay | Admitting: Internal Medicine

## 2015-07-14 ENCOUNTER — Ambulatory Visit
Admission: RE | Admit: 2015-07-14 | Discharge: 2015-07-14 | Disposition: A | Discharge status: Home / Self Care | Payer: BC Managed Care – PPO | Source: Ambulatory Visit | Attending: Internal Medicine | Admitting: Internal Medicine

## 2015-07-14 DIAGNOSIS — Z1231 Encounter for screening mammogram for malignant neoplasm of breast: Secondary | ICD-10-CM | POA: Insufficient documentation

## 2015-07-14 DIAGNOSIS — Z1239 Encounter for other screening for malignant neoplasm of breast: Secondary | ICD-10-CM

## 2015-07-16 ENCOUNTER — Other Ambulatory Visit (INDEPENDENT_AMBULATORY_CARE_PROVIDER_SITE_OTHER): Payer: BC Managed Care – PPO

## 2015-07-16 ENCOUNTER — Encounter: Payer: Self-pay | Admitting: Internal Medicine

## 2015-07-16 ENCOUNTER — Other Ambulatory Visit: Payer: BC Managed Care – PPO

## 2015-07-16 DIAGNOSIS — G4452 New daily persistent headache (NDPH): Secondary | ICD-10-CM

## 2015-07-16 DIAGNOSIS — E538 Deficiency of other specified B group vitamins: Secondary | ICD-10-CM | POA: Diagnosis not present

## 2015-07-16 LAB — VITAMIN B12: Vitamin B-12: 339 pg/mL (ref 211–911)

## 2015-07-16 LAB — HEPATIC FUNCTION PANEL
ALT: 48 U/L — AB (ref 0–35)
AST: 43 U/L — ABNORMAL HIGH (ref 0–37)
Albumin: 4 g/dL (ref 3.5–5.2)
Alkaline Phosphatase: 64 U/L (ref 39–117)
BILIRUBIN DIRECT: 0 mg/dL (ref 0.0–0.3)
TOTAL PROTEIN: 6.8 g/dL (ref 6.0–8.3)
Total Bilirubin: 0.2 mg/dL (ref 0.2–1.2)

## 2015-07-16 LAB — SEDIMENTATION RATE: Sed Rate: 36 mm/hr — ABNORMAL HIGH (ref 0–22)

## 2015-07-17 ENCOUNTER — Other Ambulatory Visit: Payer: Self-pay | Admitting: Internal Medicine

## 2015-07-17 DIAGNOSIS — R7989 Other specified abnormal findings of blood chemistry: Secondary | ICD-10-CM

## 2015-07-17 DIAGNOSIS — R945 Abnormal results of liver function studies: Secondary | ICD-10-CM

## 2015-07-17 NOTE — Progress Notes (Signed)
Order placed for abdominal ultrasound.   

## 2015-07-22 ENCOUNTER — Ambulatory Visit: Payer: BC Managed Care – PPO

## 2015-07-25 ENCOUNTER — Other Ambulatory Visit: Payer: Self-pay | Admitting: Internal Medicine

## 2015-07-27 ENCOUNTER — Ambulatory Visit
Admission: RE | Admit: 2015-07-27 | Discharge: 2015-07-27 | Disposition: A | Discharge status: Home / Self Care | Payer: BC Managed Care – PPO | Source: Ambulatory Visit | Attending: Internal Medicine | Admitting: Internal Medicine

## 2015-07-27 DIAGNOSIS — R932 Abnormal findings on diagnostic imaging of liver and biliary tract: Secondary | ICD-10-CM | POA: Insufficient documentation

## 2015-07-27 DIAGNOSIS — R945 Abnormal results of liver function studies: Secondary | ICD-10-CM

## 2015-07-27 DIAGNOSIS — I77811 Abdominal aortic ectasia: Secondary | ICD-10-CM | POA: Diagnosis not present

## 2015-07-27 DIAGNOSIS — R7989 Other specified abnormal findings of blood chemistry: Secondary | ICD-10-CM | POA: Diagnosis not present

## 2015-07-28 ENCOUNTER — Telehealth: Payer: Self-pay | Admitting: Internal Medicine

## 2015-07-28 ENCOUNTER — Other Ambulatory Visit: Payer: Self-pay | Admitting: Internal Medicine

## 2015-07-28 DIAGNOSIS — R7989 Other specified abnormal findings of blood chemistry: Secondary | ICD-10-CM

## 2015-07-28 DIAGNOSIS — R945 Abnormal results of liver function studies: Secondary | ICD-10-CM

## 2015-07-28 NOTE — Telephone Encounter (Signed)
Pt called to get her lab results. Call pt @ [phone]. Thank you!

## 2015-07-28 NOTE — Telephone Encounter (Signed)
Called patient and notified of results per Dr. Roby LoftsScott's note.  Lab appointment scheduled.

## 2015-07-28 NOTE — Progress Notes (Signed)
Order placed for f/u liver panel.  

## 2015-07-29 ENCOUNTER — Ambulatory Visit
Admission: RE | Admit: 2015-07-29 | Discharge: 2015-07-29 | Disposition: A | Discharge status: Home / Self Care | Payer: BC Managed Care – PPO | Source: Ambulatory Visit | Attending: Internal Medicine | Admitting: Internal Medicine

## 2015-07-29 DIAGNOSIS — G4452 New daily persistent headache (NDPH): Secondary | ICD-10-CM

## 2015-07-30 ENCOUNTER — Telehealth: Payer: Self-pay | Admitting: *Deleted

## 2015-07-30 MED ORDER — LORAZEPAM 0.5 MG PO TABS: ORAL_TABLET | ORAL | Status: DC

## 2015-07-30 NOTE — Telephone Encounter (Signed)
MRI was attempted to be done yesterday she took the meds and got in the scanner and they had a power outage so the test had to be rescheduled, so she needs the prescription again for the MRI that is tomorrow morning. thanks

## 2015-07-30 NOTE — Telephone Encounter (Signed)
ok'd refill

## 2015-07-30 NOTE — Telephone Encounter (Signed)
Please advise 

## 2015-07-30 NOTE — Telephone Encounter (Signed)
Patient requested to have Ativan ordered before her MRI in the morning.

## 2015-07-30 NOTE — Telephone Encounter (Signed)
See medications from 07/09/15.  Per note, should have been sent in.  If not, then ok with same directions as before and same number of pills.

## 2015-07-31 ENCOUNTER — Ambulatory Visit
Admission: RE | Admit: 2015-07-31 | Discharge: 2015-07-31 | Disposition: A | Discharge status: Home / Self Care | Payer: BC Managed Care – PPO | Source: Ambulatory Visit | Attending: Internal Medicine | Admitting: Internal Medicine

## 2015-07-31 DIAGNOSIS — J323 Chronic sphenoidal sinusitis: Secondary | ICD-10-CM | POA: Insufficient documentation

## 2015-07-31 DIAGNOSIS — G4452 New daily persistent headache (NDPH): Secondary | ICD-10-CM | POA: Diagnosis present

## 2015-07-31 MED ORDER — GADOBENATE DIMEGLUMINE 529 MG/ML IV SOLN
20.0000 mL | Freq: Once | INTRAVENOUS | Status: AC | PRN
  Administered 2015-07-31: 20 mL via INTRAVENOUS

## 2015-08-01 ENCOUNTER — Other Ambulatory Visit: Payer: Self-pay | Admitting: Internal Medicine

## 2015-08-06 ENCOUNTER — Telehealth: Payer: Self-pay | Admitting: *Deleted

## 2015-08-06 DIAGNOSIS — R519 Headache, unspecified: Secondary | ICD-10-CM

## 2015-08-06 DIAGNOSIS — R51 Headache: Principal | ICD-10-CM

## 2015-08-06 DIAGNOSIS — J323 Chronic sphenoidal sinusitis: Secondary | ICD-10-CM

## 2015-08-06 NOTE — Telephone Encounter (Signed)
Order placed for ent referral.  °

## 2015-08-06 NOTE — Telephone Encounter (Signed)
Patient is aware of MRI results and agrees to see ENT.

## 2015-08-06 NOTE — Telephone Encounter (Signed)
Patient has requested a call in regards to her MRI results from 07-31-15

## 2015-08-28 ENCOUNTER — Other Ambulatory Visit (INDEPENDENT_AMBULATORY_CARE_PROVIDER_SITE_OTHER): Payer: BC Managed Care – PPO

## 2015-08-28 DIAGNOSIS — R7989 Other specified abnormal findings of blood chemistry: Secondary | ICD-10-CM

## 2015-08-28 DIAGNOSIS — R945 Abnormal results of liver function studies: Secondary | ICD-10-CM

## 2015-08-28 LAB — HEPATIC FUNCTION PANEL
ALT: 30 U/L (ref 0–35)
AST: 21 U/L (ref 0–37)
Albumin: 3.9 g/dL (ref 3.5–5.2)
Alkaline Phosphatase: 63 U/L (ref 39–117)
Bilirubin, Direct: 0.1 mg/dL (ref 0.0–0.3)
TOTAL PROTEIN: 6.3 g/dL (ref 6.0–8.3)
Total Bilirubin: 0.3 mg/dL (ref 0.2–1.2)

## 2015-08-29 ENCOUNTER — Encounter: Payer: Self-pay | Admitting: Internal Medicine

## 2015-09-01 ENCOUNTER — Other Ambulatory Visit: Payer: Self-pay | Admitting: Internal Medicine

## 2015-09-08 ENCOUNTER — Encounter: Payer: Self-pay | Admitting: Internal Medicine

## 2015-09-09 MED ORDER — FLUCONAZOLE 150 MG PO TABS: 150.0000 mg | ORAL_TABLET | Freq: Once | ORAL | Status: DC

## 2015-09-09 NOTE — Addendum Note (Signed)
Addended by: Charm BargesSCOTT, Jeramiah Mccaughey S on: 09/09/2015 09:36 PM   Modules accepted: Orders

## 2015-09-09 NOTE — Telephone Encounter (Signed)
rx written for diflucan x 1.

## 2015-09-15 ENCOUNTER — Other Ambulatory Visit: Payer: Self-pay | Admitting: Internal Medicine

## 2015-09-17 ENCOUNTER — Encounter: Payer: Self-pay | Admitting: Internal Medicine

## 2015-09-17 ENCOUNTER — Ambulatory Visit (INDEPENDENT_AMBULATORY_CARE_PROVIDER_SITE_OTHER): Payer: BC Managed Care – PPO | Admitting: Internal Medicine

## 2015-09-17 VITALS — BP 110/70 | HR 87 | Temp 98.0°F | Resp 18 | Ht 64.0 in | Wt 229.5 lb

## 2015-09-17 DIAGNOSIS — D649 Anemia, unspecified: Secondary | ICD-10-CM | POA: Diagnosis not present

## 2015-09-17 DIAGNOSIS — I10 Essential (primary) hypertension: Secondary | ICD-10-CM | POA: Diagnosis not present

## 2015-09-17 DIAGNOSIS — E119 Type 2 diabetes mellitus without complications: Secondary | ICD-10-CM

## 2015-09-17 DIAGNOSIS — E7800 Pure hypercholesterolemia, unspecified: Secondary | ICD-10-CM | POA: Diagnosis not present

## 2015-09-17 DIAGNOSIS — I25118 Atherosclerotic heart disease of native coronary artery with other forms of angina pectoris: Secondary | ICD-10-CM

## 2015-09-17 DIAGNOSIS — G4452 New daily persistent headache (NDPH): Secondary | ICD-10-CM

## 2015-09-17 DIAGNOSIS — E669 Obesity, unspecified: Secondary | ICD-10-CM

## 2015-09-17 DIAGNOSIS — G4730 Sleep apnea, unspecified: Secondary | ICD-10-CM

## 2015-09-17 NOTE — Progress Notes (Signed)
Patient ID: Evelyn James, female   DOB: 1960-11-25, 55 y.o.   MRN: [id number]

## 2015-09-17 NOTE — Progress Notes (Signed)
Patient ID: Evelyn James, female   DOB: 1960-06-19, 55 y.o.   MRN: [id number]   Subjective:    Patient ID: Evelyn James, female    DOB: [birth year], 56 y.o.   MRN: [id number]  HPI  Patient here for a scheduled follow up.  She has been seeing ENT recently.  Persistent headaches.  Found to have sphenoid sinusitis.  See note.  Just finished 2 weeks of augmentin and prednisone.  Symptoms improved while on the medication, but still some persistent headache.  Has f/u planned with ENT next week.  Per ENT, will do f/u CT and then may need surgery.  See note.  No chest pain.  No sob.  No acid reflux.  Discussed diet and exercise.  Eating more vegetables.  Eating lighter.  Has decreased her snacking at night.  States am sugars averaging 140s and pm sugars averaging 170s.  a1c recently last checked 8.3.  Has made these changes since.     Past Medical History  Diagnosis Date  . Diabetes mellitus   . Hypertension   . Hypercholesterolemia   . Reflux   . TMJ dysfunction   . Anemia     Noted 11/2011  . Chest pain     Admitted 11/2011: cath with mild nonobstructive coronary plaque, normal EF, negative cardiac enzymes and negative d-dimer.   Past Surgical History  Procedure Laterality Date  . Abdominal hysterectomy    . Submandibular mass excision    . Knee surgery    . Bilateral salpingoophorectomy      benign ovarian tumor  . Left heart catheterization with coronary angiogram N/A 12/09/2011    Procedure: LEFT HEART CATHETERIZATION WITH CORONARY ANGIOGRAM;  Surgeon: Minus Breeding, MD;  Location: [facility] CATH LAB;  Service: Cardiovascular;  Laterality: N/A;   Family History  Problem Relation Age of Onset  . Heart disease Father     Father had CABG in his 49s  . Lung cancer Mother   . Colon polyps Father   . Breast cancer Paternal Grandmother   . Arthritis/Rheumatoid Paternal Grandfather    Social History   Social History  . Marital Status: Divorced    Spouse Name: N/A  . Number of Children: 2   . Years of Education: N/A   Social History Main Topics  . Smoking status: Never Smoker   . Smokeless tobacco: Never Used  . Alcohol Use: No  . Drug Use: No  . Sexual Activity: Not Asked   Other Topics Concern  . None   Social History Narrative    Outpatient Encounter Prescriptions as of 09/17/2015  Medication Sig  . BAYER CONTOUR NEXT TEST test strip TEST once daily  . cetirizine (ZYRTEC) 10 MG tablet Take 10 mg by mouth daily.  . cyanocobalamin (,VITAMIN B-12,) 1000 MCG/ML injection INJECT 1 MILLILITER INTO THE MUSCLE EVERY MONTH.  . cyclobenzaprine (FLEXERIL) 10 MG tablet 1/2 - 1 tablet q pm prn  . escitalopram (LEXAPRO) 10 MG tablet take 2 tablets by mouth once daily  . escitalopram (LEXAPRO) 20 MG tablet take 1 tablet by mouth once daily  . Fe Fum-FePoly-Vit C-Vit B3 (INTEGRA) 62.5-62.5-40-3 MG CAPS take 1 capsule by mouth twice a day  . fluticasone (FLONASE) 50 MCG/ACT nasal spray Place 2 sprays into both nostrils daily.  Marland Kitchen JANUVIA 100 MG tablet take 1 tablet by mouth once daily  . lisinopril (PRINIVIL,ZESTRIL) 20 MG tablet take 1 tablet by mouth once daily  . LORazepam (ATIVAN) 0.5 MG tablet Take 1/2 tablet  prior to procedure.  May repeat x 1 if needed.  . metFORMIN (GLUCOPHAGE) 1000 MG tablet TAKE 1 TABLET BY MOUTH TWICE A DAY WITH A MEAL  . naproxen sodium (ANAPROX) 220 MG tablet Take 220 mg by mouth daily as needed. For pain  . pantoprazole (PROTONIX) 40 MG tablet TAKE 1 TABLET BY MOUTH EVERY DAY  . pravastatin (PRAVACHOL) 40 MG tablet take 1 tablet by mouth once daily  . PREMARIN 0.45 MG tablet take 1 tablet by mouth once daily  . Probiotic Product ([place]) Take by mouth daily as needed.  . [DISCONTINUED] fluconazole (DIFLUCAN) 150 MG tablet Take 1 tablet (150 mg total) by mouth once.   No facility-administered encounter medications on file as of 09/17/2015.    Review of Systems  Constitutional: Negative for appetite change and unexpected weight  change.  HENT: Positive for congestion and sinus pressure.   Respiratory: Negative for cough, chest tightness and shortness of breath.   Cardiovascular: Negative for chest pain, palpitations and leg swelling.  Gastrointestinal: Negative for nausea, vomiting, abdominal pain and diarrhea.  Genitourinary: Negative for dysuria and difficulty urinating.  Musculoskeletal: Negative for back pain and joint swelling.  Skin: Negative for color change and rash.  Neurological: Positive for headaches. Negative for dizziness and light-headedness.  Psychiatric/Behavioral: Negative for dysphoric mood and agitation.       Objective:    Physical Exam  Constitutional: She appears well-developed and well-nourished. No distress.  HENT:  Nose: Nose normal.  Mouth/Throat: Oropharynx is clear and moist.  Neck: Neck supple. No thyromegaly present.  Cardiovascular: Normal rate and regular rhythm.   Pulmonary/Chest: Breath sounds normal. No respiratory distress. She has no wheezes.  Abdominal: Soft. Bowel sounds are normal. There is no tenderness.  Musculoskeletal: She exhibits no edema or tenderness.  Lymphadenopathy:    She has no cervical adenopathy.  Skin: No rash noted. No erythema.  Psychiatric: She has a normal mood and affect. Her behavior is normal.    BP 110/70 mmHg  Pulse 87  Temp(Src) 98 F (36.7 C) (Oral)  Resp 18  Ht _0  (1.626 m)  Wt 229 lb 8 oz (104.101 kg)  BMI 39.37 kg/m2  SpO2 95% Wt Readings from Last 3 Encounters:  09/17/15 229 lb 8 oz (104.101 kg)  07/09/15 236 lb (107.049 kg)  05/12/15 231 lb (104.781 kg)     Lab Results  Component Value Date   WBC 10.3 07/07/2015   HGB 12.0 07/07/2015   HCT 37.2 07/07/2015   PLT 207.0 07/07/2015   GLUCOSE 156* 07/07/2015   CHOL 169 07/07/2015   TRIG 197.0* 07/07/2015   HDL 58.70 07/07/2015   LDLDIRECT 97.0 10/31/2014   LDLCALC 71 07/07/2015   ALT 30 08/28/2015   AST 21 08/28/2015   NA 141 07/07/2015   K 4.3 07/07/2015    CL 101 07/07/2015   CREATININE 0.54 07/07/2015   BUN 11 07/07/2015   CO2 30 07/07/2015   TSH 2.32 10/31/2014   HGBA1C 8.3* 07/07/2015   MICROALBUR 2.6* 10/31/2014    Mr Brain W Wo Contrast  07/31/2015  CLINICAL DATA:  55 year old female with new daily persistent headache symptoms for 3 months. No known injury. Initial encounter. EXAM: MRI HEAD WITHOUT AND WITH CONTRAST TECHNIQUE: Multiplanar, multiecho pulse sequences of the brain and surrounding structures were obtained without and with intravenous contrast. CONTRAST:  42m MULTIHANCE GADOBENATE DIMEGLUMINE 529 MG/ML IV SOLN COMPARISON:  Neck CT 08/11/2004. FINDINGS: Cerebral volume is normal. No restricted diffusion  to suggest acute infarction. No midline shift, mass effect, evidence of mass lesion, ventriculomegaly, extra-axial collection or acute intracranial hemorrhage. Cervicomedullary junction and pituitary are within normal limits. Major intracranial vascular flow voids appear normal. Pearline Cables and white matter signal is within normal limits throughout the brain. No encephalomalacia or chronic cerebral blood products identified. No abnormal enhancement identified. No dural thickening. Major venous vascular structures are normally enhancing. Hyperostosis of the calvarium which appears benign/normal variant. Visualized bone marrow signal is within normal limits. Visible internal auditory structures appear normal. Mastoids are clear. Negative orbit and scalp soft tissues. There is mild to moderate mucosal thickening and enhancement in the left sphenoid sinus which is new since the 2006 comparison. Only trace ethmoid sinus mucosal thickening otherwise. No associated abnormal skullbase signal or enhancement outside of the left sphenoid sinus. IMPRESSION: 1.  Normal MRI appearance of the brain. 2. Mild to moderate left sphenoid sinus inflammatory changes which are new since 2006. No complicating features. Consider this as a possible source of headaches.  Electronically Signed   By: Genevie Ann M.D.   On: 07/31/2015 11:08       Assessment & Plan:   Problem List Items Addressed This Visit    Anemia    EGD and colonoscopy as outlined.  Follow cbc.       Relevant Orders   CBC with Differential/Platelet   Ferritin   CAD (coronary artery disease)    Asymptomatic.  Continue risk factor modification.        Diabetes mellitus ([place])    Discussed diet and exercise.  She has made some adjustments.  Sugar as outlined.  Check met b and a1c.        Relevant Orders   Hemoglobin K2M   Basic metabolic panel   Microalbumin / creatinine urine ratio   Headache    Found to have sphenoid sinusitis on MRI.  Seeing ENT.  Just finished abx.  Planning for f/u next week.  See note.       Hypercholesterolemia    On pravastatin.  Low cholesterol diet and exercise.  Follow lipid panel and liver function tests.        Relevant Orders   Lipid panel   Hepatic function panel   Hypertension - Primary    Blood pressure under good control.  Continue same medication regimen.  Follow pressures.  Follow metabolic panel.        Obesity (BMI 30-39.9)    Diet and exercise.        Sleep apnea    CPAP.           Einar Pheasant, MD

## 2015-09-17 NOTE — Progress Notes (Signed)
Pre-visit discussion using our clinic review tool. No additional management support is needed unless otherwise documented below in the visit note.  

## 2015-09-20 ENCOUNTER — Encounter: Payer: Self-pay | Admitting: Internal Medicine

## 2015-09-20 NOTE — Assessment & Plan Note (Signed)
Diet and exercise.   

## 2015-09-20 NOTE — Assessment & Plan Note (Signed)
Discussed diet and exercise.  She has made some adjustments.  Sugar as outlined.  Check met b and a1c.

## 2015-09-20 NOTE — Assessment & Plan Note (Signed)
CPAP.  

## 2015-09-20 NOTE — Assessment & Plan Note (Signed)
Found to have sphenoid sinusitis on MRI.  Seeing ENT.  Just finished abx.  Planning for f/u next week.  See note.

## 2015-09-20 NOTE — Assessment & Plan Note (Signed)
Asymptomatic.  Continue risk factor modification.    

## 2015-09-20 NOTE — Assessment & Plan Note (Signed)
On pravastatin.  Low cholesterol diet and exercise.  Follow lipid panel and liver function tests.   

## 2015-09-20 NOTE — Assessment & Plan Note (Signed)
Blood pressure under good control.  Continue same medication regimen.  Follow pressures.  Follow metabolic panel.   

## 2015-09-20 NOTE — Assessment & Plan Note (Signed)
EGD and colonoscopy as outlined.  Follow cbc.

## 2015-09-30 ENCOUNTER — Other Ambulatory Visit: Payer: Self-pay | Admitting: Family Medicine

## 2015-09-30 ENCOUNTER — Ambulatory Visit (INDEPENDENT_AMBULATORY_CARE_PROVIDER_SITE_OTHER): Payer: BC Managed Care – PPO | Admitting: Family Medicine

## 2015-09-30 VITALS — BP 130/80 | HR 99 | Temp 98.4°F | Ht 64.0 in | Wt 236.0 lb

## 2015-09-30 DIAGNOSIS — L298 Other pruritus: Secondary | ICD-10-CM

## 2015-09-30 DIAGNOSIS — R300 Dysuria: Secondary | ICD-10-CM

## 2015-09-30 DIAGNOSIS — N898 Other specified noninflammatory disorders of vagina: Secondary | ICD-10-CM

## 2015-09-30 LAB — POCT URINALYSIS DIPSTICK
Bilirubin, UA: NEGATIVE
Blood, UA: 10
Glucose, UA: NEGATIVE
Ketones, UA: NEGATIVE
NITRITE UA: NEGATIVE
Spec Grav, UA: 1.015
UROBILINOGEN UA: 0.2
pH, UA: 7.5

## 2015-09-30 MED ORDER — CEFDINIR 300 MG PO CAPS
300.0000 mg | ORAL_CAPSULE | Freq: Two times a day (BID) | ORAL | Status: DC
Start: 2015-09-30 — End: 2015-10-21

## 2015-09-30 NOTE — Patient Instructions (Signed)
Nice to meet you. You likely have a UTI. We will treat this with Omnicef. We will send her urine for culture to ensure there is an infection and confirm that it is sensitive to this medication. We will evaluate for a yeast infection as well. If you develop abdominal pain, blood in your urine, fevers, or any new or changing symptoms please seek medical attention.

## 2015-09-30 NOTE — Progress Notes (Signed)
  Marikay AlarEric Sonnenberg, MD Phone: [phone]  Dorris FetchKathy W Dan HumphreysWalker is a 55 y.o. female who presents today for same-day visit.  Patient reports UTI symptoms over the last several days. Started with itching at her urethra. Then developed dysuria, mild cramping sensation, urinary frequency, and urinary urgency. No fevers. No abdominal pain. No vaginal discharge. Thinks the itching may be related to wearing pads and with the heat her vagina becoming irritated. She's not been sexually active in a number of years.  PMH: Has a history of UTIs. This feels similar.   ROS see history of present illness  Objective  Physical Exam Filed Vitals:   09/30/15 1510  BP: 130/80  Pulse: 99  Temp: 98.4 F (36.9 C)    BP Readings from Last 3 Encounters:  09/30/15 130/80  09/17/15 110/70  07/09/15 110/70   Wt Readings from Last 3 Encounters:  09/30/15 236 lb (107.049 kg)  09/17/15 229 lb 8 oz (104.101 kg)  07/09/15 236 lb (107.049 kg)    Physical Exam  Constitutional: She is well-developed, well-nourished, and in no distress.  Cardiovascular: Normal rate, regular rhythm and normal heart sounds.   Pulmonary/Chest: Effort normal and breath sounds normal.  Abdominal: Soft. Bowel sounds are normal. She exhibits no distension. There is no tenderness. There is no rebound and no guarding.  Genitourinary:  Normal labia, normal vaginal mucosa, normal urethral meatus, small amount of white thick discharge noted, patient is status post hysterectomy thus no cervix was noted, benign bimanual exam  Neurological: She is alert. Gait normal.  Skin: Skin is warm and dry.     Assessment/Plan: Please see individual problem list.  Dysuria New onset symptoms. UA and symptoms consistent with UTI. Itching at urethra could be related to yeast infection. We'll send urine for culture and microscopy. We will send a wet prep. Given patient's sulfa allergy and most recent cultures we will treat with Omnicef. If not sensitive to  third-generation cephalosporins on culture would likely treat with Cipro as has been responsive to this in the past. She's given return precautions.    Orders Placed This Encounter  Procedures  . WET PREP BY MOLECULAR PROBE  . Urine Culture  . Urine Microscopic Only  . POCT Urinalysis Dipstick    Meds ordered this encounter  Medications  . cefdinir (OMNICEF) 300 MG capsule    Sig: Take 1 capsule (300 mg total) by mouth 2 (two) times daily.    Dispense:  10 capsule    Refill:  0     Marikay AlarEric Sonnenberg, MD [facility] [place]

## 2015-09-30 NOTE — Progress Notes (Signed)
Pre visit review using our clinic review tool, if applicable. No additional management support is needed unless otherwise documented below in the visit note. 

## 2015-09-30 NOTE — Assessment & Plan Note (Signed)
New onset symptoms. UA and symptoms consistent with UTI. Itching at urethra could be related to yeast infection. We'll send urine for culture and microscopy. We will send a wet prep. Given patient's sulfa allergy and most recent cultures we will treat with Omnicef. If not sensitive to third-generation cephalosporins on culture would likely treat with Cipro as has been responsive to this in the past. She's given return precautions.

## 2015-10-01 LAB — WET PREP BY MOLECULAR PROBE
Candida species: POSITIVE — AB
Gardnerella vaginalis: NEGATIVE
Trichomonas vaginosis: NEGATIVE

## 2015-10-01 LAB — URINALYSIS, MICROSCOPIC ONLY

## 2015-10-03 LAB — URINE CULTURE: Colony Count: >=100000

## 2015-10-06 ENCOUNTER — Encounter: Payer: Self-pay | Admitting: Family Medicine

## 2015-10-06 ENCOUNTER — Other Ambulatory Visit: Payer: Self-pay | Admitting: Internal Medicine

## 2015-10-07 ENCOUNTER — Other Ambulatory Visit: Payer: Self-pay | Admitting: Family Medicine

## 2015-10-07 MED ORDER — FLUCONAZOLE 150 MG PO TABS: 150.0000 mg | ORAL_TABLET | ORAL | 0 refills | Status: DC

## 2015-10-15 ENCOUNTER — Other Ambulatory Visit: Payer: Self-pay | Admitting: Internal Medicine

## 2015-10-16 NOTE — Telephone Encounter (Signed)
Last seen by you on 09/17/15. Medication last refilled 07/14/15 with two refills. Please advise?

## 2015-10-21 ENCOUNTER — Encounter
Admission: RE | Admit: 2015-10-21 | Discharge: 2015-10-21 | Disposition: A | Discharge status: Home / Self Care | Payer: BC Managed Care – PPO | Source: Ambulatory Visit | Attending: Otolaryngology | Admitting: Otolaryngology

## 2015-10-21 DIAGNOSIS — G4730 Sleep apnea, unspecified: Secondary | ICD-10-CM | POA: Diagnosis not present

## 2015-10-21 DIAGNOSIS — J323 Chronic sphenoidal sinusitis: Secondary | ICD-10-CM | POA: Diagnosis not present

## 2015-10-21 DIAGNOSIS — R51 Headache: Secondary | ICD-10-CM | POA: Diagnosis not present

## 2015-10-21 DIAGNOSIS — E119 Type 2 diabetes mellitus without complications: Secondary | ICD-10-CM | POA: Diagnosis not present

## 2015-10-21 DIAGNOSIS — Z6841 Body Mass Index (BMI) 40.0 and over, adult: Secondary | ICD-10-CM | POA: Diagnosis not present

## 2015-10-21 DIAGNOSIS — I10 Essential (primary) hypertension: Secondary | ICD-10-CM | POA: Diagnosis not present

## 2015-10-21 DIAGNOSIS — H6990 Unspecified Eustachian tube disorder, unspecified ear: Secondary | ICD-10-CM | POA: Diagnosis not present

## 2015-10-21 DIAGNOSIS — F419 Anxiety disorder, unspecified: Secondary | ICD-10-CM | POA: Diagnosis not present

## 2015-10-21 DIAGNOSIS — B488 Other specified mycoses: Secondary | ICD-10-CM | POA: Diagnosis not present

## 2015-10-21 DIAGNOSIS — Z882 Allergy status to sulfonamides status: Secondary | ICD-10-CM | POA: Diagnosis not present

## 2015-10-21 DIAGNOSIS — K219 Gastro-esophageal reflux disease without esophagitis: Secondary | ICD-10-CM | POA: Diagnosis not present

## 2015-10-21 DIAGNOSIS — Z79899 Other long term (current) drug therapy: Secondary | ICD-10-CM | POA: Diagnosis not present

## 2015-10-21 DIAGNOSIS — D649 Anemia, unspecified: Secondary | ICD-10-CM | POA: Diagnosis not present

## 2015-10-21 DIAGNOSIS — E669 Obesity, unspecified: Secondary | ICD-10-CM | POA: Diagnosis not present

## 2015-10-21 HISTORY — DX: Gastro-esophageal reflux disease without esophagitis: K21.9

## 2015-10-21 HISTORY — DX: Anxiety disorder, unspecified: F41.9

## 2015-10-21 HISTORY — DX: Fatty (change of) liver, not elsewhere classified: K76.0

## 2015-10-21 LAB — CBC
HCT: 35.5 % (ref 35.0–47.0)
HEMOGLOBIN: 11.9 g/dL — AB (ref 12.0–16.0)
MCH: 28.3 pg (ref 26.0–34.0)
MCHC: 33.7 g/dL (ref 32.0–36.0)
MCV: 84.1 fL (ref 80.0–100.0)
PLATELETS: 155 10*3/uL (ref 150–440)
RBC: 4.22 MIL/uL (ref 3.80–5.20)
RDW: 14.5 % (ref 11.5–14.5)
WBC: 9.3 10*3/uL (ref 3.6–11.0)

## 2015-10-21 LAB — BASIC METABOLIC PANEL
ANION GAP: 10 (ref 5–15)
BUN: 10 mg/dL (ref 6–20)
CHLORIDE: 101 mmol/L (ref 101–111)
CO2: 30 mmol/L (ref 22–32)
Calcium: 9.3 mg/dL (ref 8.9–10.3)
Creatinine, Ser: 0.6 mg/dL (ref 0.44–1.00)
GFR calc Af Amer: >60 mL/min (ref >60)
Glucose, Bld: 108 mg/dL — ABNORMAL HIGH (ref 65–99)
POTASSIUM: 3.9 mmol/L (ref 3.5–5.1)
SODIUM: 141 mmol/L (ref 135–145)

## 2015-10-21 NOTE — Patient Instructions (Addendum)
  Your procedure is scheduled WJ:XBJYNWGNon:tomorrow at 09:30 am. Report to Same Day Surgery.   Remember: Instructions that are not followed completely may result in serious medical risk, up to and including death, or upon the discretion of your surgeon and anesthesiologist your surgery may need to be rescheduled.    _x___ 1. Do not eat food or drink liquids after midnight. No gum chewing or hard candies.     ____ 2. No Alcohol for 24 hours before or after surgery.   ____ 3. Bring all medications with you on the day of surgery if instructed.    __x__ 4. Notify your doctor if there is any change in your medical condition     (cold, fever, infections).     Do not wear jewelry, make-up, hairpins, clips or nail polish.  Do not wear lotions, powders, or perfumes. You may wear deodorant.  Do not shave 48 hours prior to surgery. Men may shave face and neck.  Do not bring valuables to the hospital.    [facility] is not responsible for any belongings or valuables.               Contacts, dentures or bridgework may not be worn into surgery.  Leave your suitcase in the car. After surgery it may be brought to your room.  For patients admitted to the hospital, discharge time is determined by your treatment team.   Patients discharged the day of surgery will not be allowed to drive home.    Please read over the following fact sheets that you were given:   [facility]  __x__ Take these medicines the morning of surgery with A SIP OF WATER:    1. Nexium   ____ Fleet Enema (as directed)   ____ Use CHG Soap as directed on instruction sheet  ____ Use inhalers on the day of surgery and bring to hospital day of surgery  ____ Stop metformin 2 days prior to surgery    ____ Take 1/2 of usual insulin dose the night before surgery and none on the morning of surgery.   ____ Stop Coumadin/Plavix/aspirin on   _x__ Stop Anti-inflammatories such as Advil, Aleve, Ibuprofen, Motrin,  Naproxen,  Naprosyn, Goodies powders or aspirin products. Tylenol OK to take.   _x___ Stop supplements vitamins and melatonin until after surgery.    _x___ Bring C-Pap to the hospital.

## 2015-10-21 NOTE — Pre-Procedure Instructions (Signed)
Comparing today's EKG with EKG done on 12/09/2011, no change.  See below results:  Cardiac Catheterization Procedure Note  Name: Talmadge CoventryKathy W Walker MRN: [id number] DOB: [birth year]  Procedure: Left Heart Cath, Selective Coronary Angiography, LV angiography  Indication:                           Procedural details: The right radial was prepped, draped, and anesthetized with 1% lidocaine. Using modified Seldinger technique, a 5 French sheath was introduced into the right radial artery. Standard Judkins catheters were used for coronary angiography and left ventriculography. Catheter exchanges were performed over a guidewire. There were no immediate procedural complications. The patient was transferred to the post catheterization recovery area for further monitoring.  Procedural Findings:                        Hemodynamics:                                                                    AO 139/79                                                                   LV 138/7                        Coronary angiography:   Coronary dominance: Right  Left mainstem:   Normal  Left anterior descending (LAD):   Moderate sized.  Mild 25% long stenosis.  Mild nonobstructive apical disease.  Diagonal large and normal.    Left circumflex (LCx):  AV groove normal.  OM1 small and normal.  OM2 moderated sized and normal.  Posterolateral small and normal.  Right coronary artery (RCA):  Large.  Normal.  Moderate sized PDA with minimal distal plaque.  PL modere to small sized and normal.  Left ventriculography: Left ventricular systolic function is normal, LVEF is estimated at 55%, there is no significant mitral regurgitation   Final Conclusions:  Mild coronary plaque.  Normal LV function.    Recommendations: No further cardiac workup.  Continue with primary risk reduction.   Rollene RotundaJames Hochrein 12/09/2011, 1:59 PM

## 2015-10-22 ENCOUNTER — Ambulatory Visit: Payer: BC Managed Care – PPO | Admitting: Anesthesiology

## 2015-10-22 ENCOUNTER — Encounter
Admission: RE | Disposition: A | Discharge status: Home / Self Care | Payer: Self-pay | Source: Ambulatory Visit | Attending: Otolaryngology

## 2015-10-22 ENCOUNTER — Ambulatory Visit
Admission: RE | Admit: 2015-10-22 | Discharge: 2015-10-22 | Disposition: A | Discharge status: Home / Self Care | Payer: BC Managed Care – PPO | Source: Ambulatory Visit | Attending: Otolaryngology | Admitting: Otolaryngology

## 2015-10-22 ENCOUNTER — Encounter: Payer: Self-pay | Admitting: Anesthesiology

## 2015-10-22 DIAGNOSIS — D649 Anemia, unspecified: Secondary | ICD-10-CM | POA: Insufficient documentation

## 2015-10-22 DIAGNOSIS — Z6841 Body Mass Index (BMI) 40.0 and over, adult: Secondary | ICD-10-CM | POA: Insufficient documentation

## 2015-10-22 DIAGNOSIS — G4730 Sleep apnea, unspecified: Secondary | ICD-10-CM | POA: Insufficient documentation

## 2015-10-22 DIAGNOSIS — B488 Other specified mycoses: Secondary | ICD-10-CM | POA: Insufficient documentation

## 2015-10-22 DIAGNOSIS — Z79899 Other long term (current) drug therapy: Secondary | ICD-10-CM | POA: Insufficient documentation

## 2015-10-22 DIAGNOSIS — F419 Anxiety disorder, unspecified: Secondary | ICD-10-CM | POA: Insufficient documentation

## 2015-10-22 DIAGNOSIS — Z882 Allergy status to sulfonamides status: Secondary | ICD-10-CM | POA: Insufficient documentation

## 2015-10-22 DIAGNOSIS — H6990 Unspecified Eustachian tube disorder, unspecified ear: Secondary | ICD-10-CM | POA: Insufficient documentation

## 2015-10-22 DIAGNOSIS — J323 Chronic sphenoidal sinusitis: Secondary | ICD-10-CM | POA: Diagnosis not present

## 2015-10-22 DIAGNOSIS — R51 Headache: Secondary | ICD-10-CM | POA: Insufficient documentation

## 2015-10-22 DIAGNOSIS — I10 Essential (primary) hypertension: Secondary | ICD-10-CM | POA: Insufficient documentation

## 2015-10-22 DIAGNOSIS — E669 Obesity, unspecified: Secondary | ICD-10-CM | POA: Insufficient documentation

## 2015-10-22 DIAGNOSIS — K219 Gastro-esophageal reflux disease without esophagitis: Secondary | ICD-10-CM | POA: Insufficient documentation

## 2015-10-22 DIAGNOSIS — E119 Type 2 diabetes mellitus without complications: Secondary | ICD-10-CM | POA: Insufficient documentation

## 2015-10-22 HISTORY — PX: IMAGE GUIDED SINUS SURGERY: SHX6570

## 2015-10-22 HISTORY — PX: SPHENOIDECTOMY: SHX2421

## 2015-10-22 LAB — GLUCOSE, CAPILLARY
Glucose-Capillary: 156 mg/dL — ABNORMAL HIGH (ref 65–99)
Glucose-Capillary: 194 mg/dL — ABNORMAL HIGH (ref 65–99)

## 2015-10-22 SURGERY — SPHENOIDECTOMY
Anesthesia: General | Wound class: Clean Contaminated

## 2015-10-22 MED ORDER — SUGAMMADEX SODIUM 200 MG/2ML IV SOLN
INTRAVENOUS | Status: DC | PRN
  Administered 2015-10-22: 225 mg via INTRAVENOUS

## 2015-10-22 MED ORDER — PHENYLEPHRINE HCL 10 MG/ML IJ SOLN
INTRAMUSCULAR | Status: DC | PRN
Start: 2015-10-22 — End: 2015-10-22
  Administered 2015-10-22: 200 ug via INTRAVENOUS

## 2015-10-22 MED ORDER — ONDANSETRON HCL 4 MG/2ML IJ SOLN
4.0000 mg | Freq: Once | INTRAMUSCULAR | Status: AC | PRN
  Administered 2015-10-22: 4 mg via INTRAVENOUS

## 2015-10-22 MED ORDER — LIDOCAINE-EPINEPHRINE 1 %-1:100000 IJ SOLN
INTRAMUSCULAR | Status: AC
  Filled 2015-10-22: qty 1

## 2015-10-22 MED ORDER — PROPOFOL 10 MG/ML IV BOLUS
INTRAVENOUS | Status: DC | PRN
  Administered 2015-10-22: 200 mg via INTRAVENOUS
  Administered 2015-10-22: 30 mg via INTRAVENOUS

## 2015-10-22 MED ORDER — SUCCINYLCHOLINE CHLORIDE 20 MG/ML IJ SOLN
INTRAMUSCULAR | Status: DC | PRN
  Administered 2015-10-22: 140 mg via INTRAVENOUS

## 2015-10-22 MED ORDER — DEXAMETHASONE SODIUM PHOSPHATE 10 MG/ML IJ SOLN
INTRAMUSCULAR | Status: DC | PRN
  Administered 2015-10-22: 5 mg via INTRAVENOUS

## 2015-10-22 MED ORDER — AMOXICILLIN-POT CLAVULANATE 875-125 MG PO TABS: 1.0000 | ORAL_TABLET | Freq: Two times a day (BID) | ORAL | 0 refills | Status: DC

## 2015-10-22 MED ORDER — HYDROCODONE-ACETAMINOPHEN 5-325 MG PO TABS: 1.0000 | ORAL_TABLET | ORAL | 0 refills | Status: DC | PRN

## 2015-10-22 MED ORDER — HYDROCODONE-ACETAMINOPHEN 5-325 MG PO TABS
1.0000 | ORAL_TABLET | ORAL | Status: DC | PRN
  Administered 2015-10-22: 1 via ORAL

## 2015-10-22 MED ORDER — ONDANSETRON HCL 4 MG/2ML IJ SOLN
INTRAMUSCULAR | Status: AC
  Filled 2015-10-22: qty 2

## 2015-10-22 MED ORDER — FENTANYL CITRATE (PF) 100 MCG/2ML IJ SOLN
INTRAMUSCULAR | Status: AC
  Filled 2015-10-22: qty 2

## 2015-10-22 MED ORDER — LIDOCAINE-EPINEPHRINE 1 %-1:100000 IJ SOLN
INTRAMUSCULAR | Status: DC | PRN
  Administered 2015-10-22: 3.5 mL

## 2015-10-22 MED ORDER — OXYMETAZOLINE HCL 0.05 % NA SOLN
NASAL | Status: AC
  Filled 2015-10-22: qty 30

## 2015-10-22 MED ORDER — BACITRACIN ZINC 500 UNIT/GM EX OINT
TOPICAL_OINTMENT | CUTANEOUS | Status: AC
  Filled 2015-10-22: qty 0.9

## 2015-10-22 MED ORDER — SODIUM CHLORIDE 0.9 % IV SOLN
INTRAVENOUS | Status: DC
  Administered 2015-10-22: 10:00:00 via INTRAVENOUS

## 2015-10-22 MED ORDER — FENTANYL CITRATE (PF) 100 MCG/2ML IJ SOLN
INTRAMUSCULAR | Status: DC | PRN
  Administered 2015-10-22: 50 ug via INTRAVENOUS

## 2015-10-22 MED ORDER — ACETAMINOPHEN 10 MG/ML IV SOLN
INTRAVENOUS | Status: DC | PRN
  Administered 2015-10-22: 1000 mg via INTRAVENOUS

## 2015-10-22 MED ORDER — ROCURONIUM BROMIDE 100 MG/10ML IV SOLN
INTRAVENOUS | Status: DC | PRN
  Administered 2015-10-22: 15 mg via INTRAVENOUS
  Administered 2015-10-22: 10 mg via INTRAVENOUS

## 2015-10-22 MED ORDER — MIDAZOLAM HCL 2 MG/2ML IJ SOLN
INTRAMUSCULAR | Status: DC | PRN
  Administered 2015-10-22: 1 mg via INTRAVENOUS

## 2015-10-22 MED ORDER — ACETAMINOPHEN 10 MG/ML IV SOLN
INTRAVENOUS | Status: AC
  Filled 2015-10-22: qty 100

## 2015-10-22 MED ORDER — HYDROCODONE-ACETAMINOPHEN 5-325 MG PO TABS
ORAL_TABLET | ORAL | Status: AC
  Filled 2015-10-22: qty 1

## 2015-10-22 MED ORDER — OXYMETAZOLINE HCL 0.05 % NA SOLN
NASAL | Status: DC | PRN
  Administered 2015-10-22: 1 via TOPICAL

## 2015-10-22 MED ORDER — ONDANSETRON HCL 4 MG/2ML IJ SOLN
INTRAMUSCULAR | Status: DC | PRN
  Administered 2015-10-22: 4 mg via INTRAVENOUS

## 2015-10-22 MED ORDER — FLUCONAZOLE 100 MG PO TABS: 100.0000 mg | ORAL_TABLET | Freq: Every day | ORAL | 0 refills | Status: AC

## 2015-10-22 MED ORDER — FENTANYL CITRATE (PF) 100 MCG/2ML IJ SOLN
25.0000 ug | INTRAMUSCULAR | Status: AC | PRN
  Administered 2015-10-22 (×6): 25 ug via INTRAVENOUS

## 2015-10-22 MED ORDER — PROMETHAZINE HCL 12.5 MG PO TABS: 12.5000 mg | ORAL_TABLET | Freq: Four times a day (QID) | ORAL | 0 refills | Status: DC | PRN

## 2015-10-22 SURGICAL SUPPLY — 42 items
BALLN SINUPLASTY KIT 6X16 (BALLOONS) ×3
BALLOON SINUPLASTY KIT 6X16 (BALLOONS) ×2 IMPLANT
BATTERY INSTRU NAVIGATION (MISCELLANEOUS) ×9 IMPLANT
CANISTER SUC SOCK COL 7IN (MISCELLANEOUS) IMPLANT
CANISTER SUCT 1200ML W/VALVE (MISCELLANEOUS) ×3 IMPLANT
CANISTER SUCT 3000ML (MISCELLANEOUS) IMPLANT
CNTNR SPEC 2.5X3XGRAD LEK (MISCELLANEOUS)
COAG SUCT 10F 3.5MM HAND CTRL (MISCELLANEOUS) ×3 IMPLANT
CONT SPEC 4OZ STER OR WHT (MISCELLANEOUS)
CONTAINER SPEC 2.5X3XGRAD LEK (MISCELLANEOUS) IMPLANT
DEVICE INFLATION 20/61 (MISCELLANEOUS) ×3 IMPLANT
DRESSING NASL FOAM PST OP SINU (MISCELLANEOUS) ×2 IMPLANT
DRSG NASAL 4CM NASOPORE (MISCELLANEOUS) IMPLANT
DRSG NASAL FOAM POST OP SINU (MISCELLANEOUS) ×3
ELECT REM PT RETURN 9FT ADLT (ELECTROSURGICAL) ×3
ELECTRODE REM PT RTRN 9FT ADLT (ELECTROSURGICAL) ×2 IMPLANT
GAUZE PACK 2X3YD (MISCELLANEOUS) IMPLANT
GLOVE BIO SURGEON STRL SZ7.5 (GLOVE) ×6 IMPLANT
GOWN STRL REUS W/ TWL LRG LVL3 (GOWN DISPOSABLE) ×6 IMPLANT
GOWN STRL REUS W/TWL LRG LVL3 (GOWN DISPOSABLE) ×3
IRRIGATOR 4MM STR (IRRIGATION / IRRIGATOR) IMPLANT
IV NS 1000ML (IV SOLUTION) ×1
IV NS 1000ML BAXH (IV SOLUTION) ×2 IMPLANT
KIT RM TURNOVER STRD PROC AR (KITS) ×3 IMPLANT
NAVIGATION MASK REG  ST (MISCELLANEOUS) ×3 IMPLANT
NS IRRIG 500ML POUR BTL (IV SOLUTION) ×3 IMPLANT
PACK HEAD/NECK (MISCELLANEOUS) ×3 IMPLANT
PACKING NASAL EPIS 4X2.4 XEROG (MISCELLANEOUS) IMPLANT
PATTIES SURGICAL .5 X3 (DISPOSABLE) ×6 IMPLANT
SET HANDPIECE IRR DIEGO (MISCELLANEOUS) IMPLANT
SINUPLASTY IRRIG CATH  REL (CATHETERS) ×3 IMPLANT
SOL ANTI-FOG 6CC FOG-OUT (MISCELLANEOUS) ×2 IMPLANT
SOL FOG-OUT ANTI-FOG 6CC (MISCELLANEOUS) ×1
SPLINT NASAL REUTER (MISCELLANEOUS) IMPLANT
SPLINT NASAL REUTER .5MM (MISCELLANEOUS) IMPLANT
SUT CHROMIC 4 0 RB 1X27 (SUTURE) IMPLANT
SUT ETHILON 3 0 PS 1 (SUTURE) IMPLANT
SWAB CULTURE AMIES ANAERIB BLU (MISCELLANEOUS) IMPLANT
SYR 30ML LL (SYRINGE) ×3 IMPLANT
TRAP SPECIMEN MUCOUS 40CC (MISCELLANEOUS) IMPLANT
TUBING CONNECTING 10 (TUBING) ×3 IMPLANT
WATER STERILE IRR 1000ML POUR (IV SOLUTION) ×3 IMPLANT

## 2015-10-22 NOTE — Op Note (Signed)
..  10/22/2015  12:35 PM    Evelyn James  [id number]   Pre-Op Dx:  Chronic Rhinosinusitis refractory to medical treatment, Eustachian Tube Dysfunction  Post-op Dx: Same  Procedure:      1)  Image Guided Sinus Surgery,   2)  Left Sphenoidotomy  Surgeon:  Kerry DoryVAUGHT,Dixon Luczak  Anes:  General  EBL:  20  Complications:  None  Indications:  Left chronic sphenoid sinusitis  Findings: osteitic bone and fungus ball in left sphenoid sinus  Procedure: After the patient was identified in holding and the benefits of the procedure were reviewed as well as the consent and risks.  The patient was taken to the operating room and with the patient in a comfortable supine position,  general orotracheal anesthesia was induced without difficulty.  A proper time-out was performed.      The Stryker image guidance system was set up and calibrated in the normal fashion with an acceptable error of 1.695mm. This was referred to throughout the case, but was not used as a guide as the calibration was significant off.      The patient next received preoperative Afrin spray for topical decongestion and vasoconstriction and 1% Xylocaine with 1:100,000 epinephrine, 3 cc's, was infiltrated into the inferior turbinates, septum, and anterior middle turbinates bilaterally.  Several minutes were allowed for this to take effect.  Cottoniod pledgets soaked in Afrin were placed into both nasal cavities and left while the patient was prepped and draped in the standard fashion.  The materials were removed from the nose and observed to be intact and correct in number.  The nose was next inspected with a zero degree endoscope and the middle turbinate on the left was lateralized.  Afrin soaked pledgets were placed medial to the middle turbinate and superior turbinate.  At this time, attention was directed to the patient's left sinus cavities.  Under endoscopic visualization, the sphenoid os was visualized and noted to have  significant thickend bone surrounding the os.  Using straightbiting forceps, balloon dilation, mushroom punch, the opening was enlarged.  Fungus type material was filling the sinus and was removed with repeated irrigation.  Under visualization, the os was widely patent and not more fungal type material was noted within the sinus.  The sinus was then irrigated again with sterile saline.  Stamberger sinufoam was place around the sphenoid os as well as the medial and lateral aspect of the middle turbinate.  Care of the patient at this time was transferred to anesthesia and was extubated and taken to PACU in good condition.  Dispo:   PACU to home  Plan: Ice, elevation, narcotic analgesia and prophylactic antibiotics. We will reevaluate the patient in the office in 7 days.  Return to work in 7-10 days, strenuous activities in two weeks.   [name] 10/22/2015 12:35 PM

## 2015-10-22 NOTE — Transfer of Care (Signed)
Immediate Anesthesia Transfer of Care Note  Patient: Evelyn CoventryKathy W Walker  Procedure(s) Performed: Procedure(s): SPHENOIDECTOMY (Left) IMAGE GUIDED SINUS SURGERY (N/A)  Patient Location: PACU  Anesthesia Type:General  Level of Consciousness: sedated  Airway & Oxygen Therapy: Patient Spontanous Breathing and Patient connected to face mask oxygen  Post-op Assessment: Report given to RN and Post -op Vital signs reviewed and stable  Post vital signs: Reviewed and stable  Last Vitals:  Vitals:   10/22/15 1005  BP: (!) 123/54  Pulse: 79  Resp: 18  Temp: 37 C    Last Pain:  Vitals:   10/22/15 1005  TempSrc: Oral         Complications: No apparent anesthesia complications

## 2015-10-22 NOTE — Discharge Instructions (Signed)

## 2015-10-22 NOTE — H&P (Signed)
..  History and Physical paper copy reviewed and updated date of procedure and will be scanned into system.  

## 2015-10-22 NOTE — Anesthesia Preprocedure Evaluation (Signed)
Anesthesia Evaluation  Patient identified by MRN, date of birth, ID band Patient awake    Reviewed: Allergy & Precautions, NPO status , Patient's Chart, lab work & pertinent test results, reviewed documented beta blocker date and time   Airway Mallampati: III  TM Distance: >3 FB     Dental  (+) Chipped   Pulmonary sleep apnea ,           Cardiovascular hypertension,      Neuro/Psych  Headaches, Anxiety    GI/Hepatic GERD  Controlled,  Endo/Other  diabetes  Renal/GU      Musculoskeletal   Abdominal   Peds  Hematology  (+) anemia ,   Anesthesia Other Findings Obesity.  Reproductive/Obstetrics                             Anesthesia Physical Anesthesia Plan  ASA: III  Anesthesia Plan: General   Post-op Pain Management:    Induction: Intravenous  Airway Management Planned: Oral ETT  Additional Equipment:   Intra-op Plan:   Post-operative Plan:   Informed Consent: I have reviewed the patients History and Physical, chart, labs and discussed the procedure including the risks, benefits and alternatives for the proposed anesthesia with the patient or authorized representative who has indicated his/her understanding and acceptance.     Plan Discussed with: CRNA  Anesthesia Plan Comments:         Anesthesia Quick Evaluation

## 2015-10-22 NOTE — Anesthesia Procedure Notes (Signed)
Procedure Name: Intubation Date/Time: 10/22/2015 11:22 AM Performed by: Lenard SimmerKARENZ, Jamiah Recore Pre-anesthesia Checklist: Patient identified, Emergency Drugs available, Suction available, Patient being monitored and Timeout performed Patient Re-evaluated:Patient Re-evaluated prior to inductionOxygen Delivery Method: Circle system utilized Preoxygenation: Pre-oxygenation with 100% oxygen Intubation Type: IV induction Ventilation: Mask ventilation without difficulty Laryngoscope Size: Mac and 3 Grade View: Grade I Tube type: Oral Rae Tube size: 7.0 mm Number of attempts: 1 Placement Confirmation: ETT inserted through vocal cords under direct vision,  breath sounds checked- equal and bilateral and positive ETCO2 Secured at: 23 cm Tube secured with: Tape Dental Injury: Teeth and Oropharynx as per pre-operative assessment

## 2015-10-23 LAB — SURGICAL PATHOLOGY

## 2015-10-23 NOTE — Anesthesia Postprocedure Evaluation (Signed)
Anesthesia Post Note  Patient: Talmadge CoventryKathy W Walker  Procedure(s) Performed: Procedure(s) (LRB): SPHENOIDECTOMY (Left) IMAGE GUIDED SINUS SURGERY (N/A)  Patient location during evaluation: PACU Anesthesia Type: General Level of consciousness: awake and alert Pain management: pain level controlled Vital Signs Assessment: post-procedure vital signs reviewed and stable Respiratory status: spontaneous breathing, nonlabored ventilation, respiratory function stable and patient connected to nasal cannula oxygen Cardiovascular status: blood pressure returned to baseline and stable Postop Assessment: no signs of nausea or vomiting Anesthetic complications: no    Last Vitals:  Vitals:   10/22/15 1421 10/22/15 1514  BP: (!) 151/81 (!) 109/58  Pulse: 76 76  Resp: 16 16  Temp: 36.1 C 36.1 C    Last Pain:  Vitals:   10/22/15 1514  TempSrc: Tympanic  PainSc:                  Takara Sermons S

## 2015-10-26 ENCOUNTER — Other Ambulatory Visit: Payer: Self-pay | Admitting: Internal Medicine

## 2015-11-11 ENCOUNTER — Other Ambulatory Visit (HOSPITAL_COMMUNITY)
Admission: RE | Admit: 2015-11-11 | Discharge: 2015-11-11 | Disposition: A | Discharge status: Home / Self Care | Payer: BC Managed Care – PPO | Source: Ambulatory Visit | Attending: Family Medicine | Admitting: Family Medicine

## 2015-11-11 ENCOUNTER — Encounter: Payer: Self-pay | Admitting: Family Medicine

## 2015-11-11 ENCOUNTER — Ambulatory Visit (INDEPENDENT_AMBULATORY_CARE_PROVIDER_SITE_OTHER): Payer: BC Managed Care – PPO | Admitting: Family Medicine

## 2015-11-11 VITALS — BP 112/70 | HR 98 | Temp 98.2°F | Wt 231.6 lb

## 2015-11-11 DIAGNOSIS — Z113 Encounter for screening for infections with a predominantly sexual mode of transmission: Secondary | ICD-10-CM | POA: Insufficient documentation

## 2015-11-11 DIAGNOSIS — R10819 Abdominal tenderness, unspecified site: Secondary | ICD-10-CM | POA: Diagnosis not present

## 2015-11-11 DIAGNOSIS — R300 Dysuria: Secondary | ICD-10-CM

## 2015-11-11 LAB — POCT URINALYSIS DIPSTICK
Blood, UA: NEGATIVE
Glucose, UA: NEGATIVE
Ketones, UA: 5
LEUKOCYTES UA: NEGATIVE
NITRITE UA: NEGATIVE
PH UA: 6
Spec Grav, UA: >=1.03
UROBILINOGEN UA: 0.2

## 2015-11-11 NOTE — Progress Notes (Signed)
  Evelyn AlarEric Taylore Hinde, MD Phone: [phone]  Evelyn James is a 55 y.o. female who presents today for same-day visit.  Patient notes onset of dysuria on Monday. Notes urinary frequency and urgency with some suprapubic cramping as well. No vaginal discharge. No vaginal itching. No fevers. No abdominal pain. She has not been sexually active in several years. Notes her symptoms are better today.  ROS see history of present illness  Objective  Physical Exam Vitals:   11/11/15 0948  BP: 112/70  Pulse: 98  Temp: 98.2 F (36.8 C)    BP Readings from Last 3 Encounters:  11/11/15 112/70  10/22/15 (!) 109/58  10/21/15 (!) 141/73   Wt Readings from Last 3 Encounters:  11/11/15 231 lb 9.6 oz (105.1 kg)  10/22/15 236 lb (107 kg)  10/21/15 236 lb (107 kg)    Physical Exam  Constitutional: No distress.  Cardiovascular: Normal rate, regular rhythm and normal heart sounds.   Pulmonary/Chest: Effort normal and breath sounds normal.  Abdominal: Soft. Bowel sounds are normal. She exhibits no distension. There is tenderness (mild suprapubic tenderness). There is no rebound and no guarding.  Genitourinary:  Genitourinary Comments: Normal labia, normal vaginal mucosa, minimal white discharge, cervix is absent, no discomfort on bimanual exam  Neurological: She is alert. Gait normal.  Skin: Skin is warm and dry. She is not diaphoretic.     Assessment/Plan: Please see individual problem list.  Dysuria Patient with several days of dysuria and urinary frequency and urgency concerning for UTI. UA is negative. Exam revealed suprapubic tenderness. Given negative UA and suprapubic tenderness pelvic exam was performed with minimal white discharge. Wet prep and gonorrhea chlamydia were collected. Urine will be sent for culture. Will call patient with results. She is given return precautions.   Orders Placed This Encounter  Procedures  . Urine Culture  . WET PREP BY MOLECULAR PROBE  . POCT  Urinalysis Dipstick    Evelyn AlarEric Cheyrl Buley, MD [facility] [place] [place]

## 2015-11-11 NOTE — Assessment & Plan Note (Signed)
Patient with several days of dysuria and urinary frequency and urgency concerning for UTI. UA is negative. Exam revealed suprapubic tenderness. Given negative UA and suprapubic tenderness pelvic exam was performed with minimal white discharge. Wet prep and gonorrhea chlamydia were collected. Urine will be sent for culture. Will call patient with results. She is given return precautions.

## 2015-11-11 NOTE — Progress Notes (Signed)
Pre visit review using our clinic review tool, if applicable. No additional management support is needed unless otherwise documented below in the visit note. 

## 2015-11-11 NOTE — Patient Instructions (Signed)
Nice to see you. We will call with the results of your swabs and urine culture return. If you develop abdominal pain, fevers, or any new or changing symptoms please seek medical attention.

## 2015-11-12 ENCOUNTER — Other Ambulatory Visit: Payer: Self-pay | Admitting: Family Medicine

## 2015-11-12 LAB — WET PREP BY MOLECULAR PROBE
CANDIDA SPECIES: NEGATIVE
GARDNERELLA VAGINALIS: POSITIVE — AB
Trichomonas vaginosis: NEGATIVE

## 2015-11-12 LAB — CERVICOVAGINAL ANCILLARY ONLY
CHLAMYDIA, DNA PROBE: NEGATIVE
Neisseria Gonorrhea: NEGATIVE

## 2015-11-12 MED ORDER — METRONIDAZOLE 500 MG PO TABS: 500.0000 mg | ORAL_TABLET | Freq: Two times a day (BID) | ORAL | 0 refills | Status: DC

## 2015-11-13 LAB — URINE CULTURE: Organism ID, Bacteria: 10000

## 2015-11-23 ENCOUNTER — Other Ambulatory Visit (INDEPENDENT_AMBULATORY_CARE_PROVIDER_SITE_OTHER): Payer: BC Managed Care – PPO

## 2015-11-23 DIAGNOSIS — E119 Type 2 diabetes mellitus without complications: Secondary | ICD-10-CM

## 2015-11-23 DIAGNOSIS — E7800 Pure hypercholesterolemia, unspecified: Secondary | ICD-10-CM | POA: Diagnosis not present

## 2015-11-23 DIAGNOSIS — D649 Anemia, unspecified: Secondary | ICD-10-CM

## 2015-11-23 LAB — CBC WITH DIFFERENTIAL/PLATELET
BASOS PCT: 0.5 % (ref 0.0–3.0)
Basophils Absolute: 0 10*3/uL (ref 0.0–0.1)
EOS ABS: 0.2 10*3/uL (ref 0.0–0.7)
Eosinophils Relative: 2.3 % (ref 0.0–5.0)
HEMATOCRIT: 35 % — AB (ref 36.0–46.0)
HEMOGLOBIN: 11.5 g/dL — AB (ref 12.0–15.0)
LYMPHS PCT: 29.1 % (ref 12.0–46.0)
Lymphs Abs: 2.5 10*3/uL (ref 0.7–4.0)
MCHC: 32.9 g/dL (ref 30.0–36.0)
MCV: 83.6 fl (ref 78.0–100.0)
Monocytes Absolute: 0.6 10*3/uL (ref 0.1–1.0)
Monocytes Relative: 6.4 % (ref 3.0–12.0)
Neutro Abs: 5.3 10*3/uL (ref 1.4–7.7)
Neutrophils Relative %: 61.7 % (ref 43.0–77.0)
Platelets: 189 10*3/uL (ref 150.0–400.0)
RBC: 4.19 Mil/uL (ref 3.87–5.11)
RDW: 14.3 % (ref 11.5–15.5)
WBC: 8.7 10*3/uL (ref 4.0–10.5)

## 2015-11-23 LAB — LIPID PANEL
CHOL/HDL RATIO: 4
Cholesterol: 177 mg/dL (ref 0–200)
HDL: 49 mg/dL (ref >39.00)
NONHDL: 128.35
TRIGLYCERIDES: 223 mg/dL — AB (ref 0.0–149.0)
VLDL: 44.6 mg/dL — ABNORMAL HIGH (ref 0.0–40.0)

## 2015-11-23 LAB — BASIC METABOLIC PANEL
BUN: 9 mg/dL (ref 6–23)
CHLORIDE: 100 meq/L (ref 96–112)
CO2: 30 mEq/L (ref 19–32)
CREATININE: 0.53 mg/dL (ref 0.40–1.20)
Calcium: 9 mg/dL (ref 8.4–10.5)
GFR: 126.5 mL/min (ref >60.00)
Glucose, Bld: 165 mg/dL — ABNORMAL HIGH (ref 70–99)
POTASSIUM: 3.8 meq/L (ref 3.5–5.1)
Sodium: 139 mEq/L (ref 135–145)

## 2015-11-23 LAB — FERRITIN: Ferritin: 60.2 ng/mL (ref 10.0–291.0)

## 2015-11-23 LAB — HEPATIC FUNCTION PANEL
ALBUMIN: 4 g/dL (ref 3.5–5.2)
ALT: 41 U/L — ABNORMAL HIGH (ref 0–35)
AST: 42 U/L — AB (ref 0–37)
Alkaline Phosphatase: 67 U/L (ref 39–117)
Bilirubin, Direct: 0 mg/dL (ref 0.0–0.3)
Total Bilirubin: 0.3 mg/dL (ref 0.2–1.2)
Total Protein: 6.9 g/dL (ref 6.0–8.3)

## 2015-11-23 LAB — HEMOGLOBIN A1C: HEMOGLOBIN A1C: 8.2 % — AB (ref 4.6–6.5)

## 2015-11-23 LAB — LDL CHOLESTEROL, DIRECT: LDL DIRECT: 99 mg/dL

## 2015-11-23 LAB — MICROALBUMIN / CREATININE URINE RATIO
CREATININE, U: 172.7 mg/dL
MICROALB UR: 4.4 mg/dL — AB (ref 0.0–1.9)
MICROALB/CREAT RATIO: 2.5 mg/g (ref 0.0–30.0)

## 2015-11-24 ENCOUNTER — Encounter: Payer: Self-pay | Admitting: Internal Medicine

## 2015-11-26 ENCOUNTER — Ambulatory Visit (INDEPENDENT_AMBULATORY_CARE_PROVIDER_SITE_OTHER): Payer: BC Managed Care – PPO | Admitting: Internal Medicine

## 2015-11-26 ENCOUNTER — Encounter: Payer: Self-pay | Admitting: Internal Medicine

## 2015-11-26 VITALS — BP 118/76 | HR 87 | Temp 98.3°F | Ht 64.0 in | Wt 233.0 lb

## 2015-11-26 DIAGNOSIS — E669 Obesity, unspecified: Secondary | ICD-10-CM

## 2015-11-26 DIAGNOSIS — E7800 Pure hypercholesterolemia, unspecified: Secondary | ICD-10-CM

## 2015-11-26 DIAGNOSIS — R945 Abnormal results of liver function studies: Secondary | ICD-10-CM

## 2015-11-26 DIAGNOSIS — R7989 Other specified abnormal findings of blood chemistry: Secondary | ICD-10-CM | POA: Diagnosis not present

## 2015-11-26 DIAGNOSIS — I25118 Atherosclerotic heart disease of native coronary artery with other forms of angina pectoris: Secondary | ICD-10-CM

## 2015-11-26 DIAGNOSIS — E119 Type 2 diabetes mellitus without complications: Secondary | ICD-10-CM

## 2015-11-26 DIAGNOSIS — Z23 Encounter for immunization: Secondary | ICD-10-CM | POA: Diagnosis not present

## 2015-11-26 DIAGNOSIS — I10 Essential (primary) hypertension: Secondary | ICD-10-CM | POA: Diagnosis not present

## 2015-11-26 DIAGNOSIS — G4452 New daily persistent headache (NDPH): Secondary | ICD-10-CM

## 2015-11-26 DIAGNOSIS — D649 Anemia, unspecified: Secondary | ICD-10-CM

## 2015-11-26 NOTE — Progress Notes (Signed)
Pre visit review using our clinic review tool, if applicable. No additional management support is needed unless otherwise documented below in the visit note. 

## 2015-11-26 NOTE — Progress Notes (Signed)
Patient ID: Evelyn James, female   DOB: [birth year], 55 y.o.   MRN: [id number]   Subjective:    Patient ID: Evelyn James, female    DOB: [birth year], 55 y.o.   MRN: [id number]  HPI  Patient here for a scheduled follow up.  S/p sphenoidectomy.  Followed by ENT.  She feels better.  Saw ENT last week.  Has f/u in 2 months.  Headaches better.  Still has occasional minimal headache.  Tylenol relieves.  Last a1c elevated.  She has adjusted her diet.  Sugars are doing better.  States am sugars now averaging 130s and pm sugars averaging 150s.  Also, off prednisone and abx.  Breathing stable.  No chest pain.  Takes stool softeners.  Has noticed some increased gas.    Past Medical History:  Diagnosis Date  . Anemia    Noted 11/2011  . Anxiety   . Chest pain    Admitted 11/2011: cath with mild nonobstructive coronary plaque, normal EF, negative cardiac enzymes and negative d-dimer.  . Diabetes mellitus   . Fatty liver disease, nonalcoholic 2014  . GERD (gastroesophageal reflux disease)   . Hypercholesterolemia   . Hypertension   . Reflux   . TMJ dysfunction    Past Surgical History:  Procedure Laterality Date  . ABDOMINAL HYSTERECTOMY    . BILATERAL SALPINGOOPHORECTOMY     benign ovarian tumor  . IMAGE GUIDED SINUS SURGERY N/A 10/22/2015   Procedure: IMAGE GUIDED SINUS SURGERY;  Surgeon: Bud Facereighton Vaught, MD;  Location: [facility] ORS;  Service: ENT;  Laterality: N/A;  . KNEE SURGERY    . LEFT HEART CATHETERIZATION WITH CORONARY ANGIOGRAM N/A 12/09/2011   Procedure: LEFT HEART CATHETERIZATION WITH CORONARY ANGIOGRAM;  Surgeon: Rollene RotundaJames Hochrein, MD;  [facility];  Service: Cardiovascular;  Laterality: N/A;  . SPHENOIDECTOMY Left 10/22/2015   Procedure: SPHENOIDECTOMY;  Surgeon: Bud Facereighton Vaught, MD;  Location: [facility] ORS;  Service: ENT;  Laterality: Left;  . SUBMANDIBULAR MASS EXCISION     Family History  Problem Relation Age of Onset  . Heart disease Father     Father had CABG in his 5460s    . Colon polyps Father   . Lung cancer Mother   . Breast cancer Paternal Grandmother   . Arthritis/Rheumatoid Paternal Grandfather    Social History   Social History  . Marital status: Divorced    Spouse name: N/A  . Number of children: 2  . Years of education: N/A   Social History Main Topics  . Smoking status: Never Smoker  . Smokeless tobacco: Never Used  . Alcohol use No  . Drug use: No  . Sexual activity: Not Asked   Other Topics Concern  . None   Social History Narrative  . None    Outpatient Encounter Prescriptions as of 11/26/2015  Medication Sig  . acetaminophen (TYLENOL) 500 MG tablet Take 1,000 mg by mouth every 6 (six) hours as needed for mild pain.  Marland Kitchen. amoxicillin-clavulanate (AUGMENTIN) 875-125 MG tablet Take 1 tablet by mouth 2 (two) times daily.  Marland Kitchen. BAYER CONTOUR NEXT TEST test strip TEST once daily  . cetirizine (ZYRTEC) 10 MG tablet Take 10 mg by mouth daily.  . cyanocobalamin (,VITAMIN B-12,) 1000 MCG/ML injection INJECT 1 MILLILITER INTO THE MUSCLE EVERY MONTH.  . cyclobenzaprine (FLEXERIL) 10 MG tablet 1/2 - 1 tablet q pm prn (Patient taking differently: Take 5-10 mg by mouth at bedtime as needed for muscle spasms. )  . escitalopram (LEXAPRO) 10 MG tablet  take 2 tablets by mouth once daily  . escitalopram (LEXAPRO) 20 MG tablet take 1 tablet by mouth once daily  . esomeprazole (NEXIUM) 20 MG capsule Take 20 mg by mouth daily at 12 noon.  . Fe Fum-FePoly-Vit C-Vit B3 (INTEGRA) 62.5-62.5-40-3 MG CAPS take 1 capsule by mouth twice a day  . fluticasone (FLONASE) 50 MCG/ACT nasal spray Place 2 sprays into both nostrils daily.  Marland Kitchen HYDROcodone-acetaminophen (NORCO) 5-325 MG tablet Take 1 tablet by mouth every 4 (four) hours as needed for moderate pain.  Marland Kitchen JANUVIA 100 MG tablet take 1 tablet by mouth once daily  . lisinopril (PRINIVIL,ZESTRIL) 20 MG tablet take 1 tablet by mouth once daily  . Melatonin 10 MG TABS Take 10 mg by mouth at bedtime as needed (sleep).   . metFORMIN (GLUCOPHAGE) 1000 MG tablet TAKE 1 TABLET BY MOUTH TWICE A DAY WITH A MEAL  . pravastatin (PRAVACHOL) 40 MG tablet take 1 tablet by mouth once daily  . PREMARIN 0.45 MG tablet take 1 tablet by mouth once daily  . Probiotic Product (PHILLIPS COLON HEALTH PO) Take 1 tablet by mouth daily as needed (colon health).   . promethazine (PHENERGAN) 12.5 MG tablet Take 1 tablet (12.5 mg total) by mouth every 6 (six) hours as needed for nausea or vomiting.  . [DISCONTINUED] metroNIDAZOLE (FLAGYL) 500 MG tablet Take 1 tablet (500 mg total) by mouth 2 (two) times daily.   No facility-administered encounter medications on file as of 11/26/2015.     Review of Systems  Constitutional: Negative for appetite change and unexpected weight change.  HENT:       Sinus congestion and pressure much improved.    Respiratory: Negative for cough, chest tightness and shortness of breath.   Cardiovascular: Negative for chest pain, palpitations and leg swelling.  Gastrointestinal: Negative for abdominal pain, diarrhea, nausea and vomiting.  Genitourinary: Negative for difficulty urinating and dysuria.  Musculoskeletal: Negative for back pain and joint swelling.  Skin: Negative for color change and rash.  Neurological: Negative for dizziness.       Headaches much improved.    Psychiatric/Behavioral: Negative for agitation and dysphoric mood.       Objective:    Physical Exam  Constitutional: She appears well-developed and well-nourished. No distress.  HENT:  Nose: Nose normal.  Mouth/Throat: Oropharynx is clear and moist.  Neck: Neck supple. No thyromegaly present.  Cardiovascular: Normal rate and regular rhythm.   Pulmonary/Chest: Breath sounds normal. No respiratory distress. She has no wheezes.  Abdominal: Soft. Bowel sounds are normal. There is no tenderness.  Musculoskeletal: She exhibits no edema or tenderness.  Lymphadenopathy:    She has no cervical adenopathy.  Skin: No rash noted. No  erythema.  Psychiatric: She has a normal mood and affect. Her behavior is normal.    BP 118/76   Pulse 87   Temp 98.3 F (36.8 C) (Oral)   Ht 5\' 4"  (1.626 m)   Wt 233 lb (105.7 kg)   SpO2 95%   BMI 39.99 kg/m  Wt Readings from Last 3 Encounters:  11/26/15 233 lb (105.7 kg)  11/11/15 231 lb 9.6 oz (105.1 kg)  10/22/15 236 lb (107 kg)     Lab Results  Component Value Date   WBC 8.7 11/23/2015   HGB 11.5 (L) 11/23/2015   HCT 35.0 (L) 11/23/2015   PLT 189.0 11/23/2015   GLUCOSE 165 (H) 11/23/2015   CHOL 177 11/23/2015   TRIG 223.0 (H) 11/23/2015   HDL 49.00  11/23/2015   LDLDIRECT 99.0 11/23/2015   LDLCALC 71 07/07/2015   ALT 41 (H) 11/23/2015   AST 42 (H) 11/23/2015   NA 139 11/23/2015   K 3.8 11/23/2015   CL 100 11/23/2015   CREATININE 0.53 11/23/2015   BUN 9 11/23/2015   CO2 30 11/23/2015   TSH 2.32 10/31/2014   HGBA1C 8.2 (H) 11/23/2015   MICROALBUR 4.4 (H) 11/23/2015       Assessment & Plan:   Problem List Items Addressed This Visit    Anemia    Has had GI w/up with EGD and colonoscopy.  Follow cbc.        CAD (coronary artery disease)    Asymptomatic.  Continue risk factor modification.        Diabetes mellitus (HCC)    a1c just checked 8.2.  Pt states now that she is s/p sinus surgery and off abx and prednisone, sugars much better.  Discussed adding medication.  States her am sugars are now averaging 130s and pm sugars 150s.  Desires not to add medication at this time.  Wants to continue to monitor and work on diet and exercise.  Will check and record sugars bid.  Send in readings in the next few weeks.  If persistent elevation , will require additional medication.  States up to date with eye checks.        Headache    S/p sinus surgery.  Followed by ENT.  Headaches better.  Feels better.  Follow.       Hypercholesterolemia    Bad cholesterol 99.  Triglycerides 223.  Low carb diet and exercise.  Follow lipid panel.        Hypertension    Blood  pressure under good control.  Continue same medication regimen.  Follow pressures.  Follow metabolic panel.        Obesity (BMI 30-39.9)    Diet and exercise.  Follow.        Other Visit Diagnoses    Abnormal liver function tests    -  Primary   work on weight loss, diet and exercise.  follow liver panel.    Relevant Orders   Hepatic function panel   Encounter for immunization       Relevant Orders   Flu Vaccine QUAD 36+ mos IM (Completed)       Dale [place], MD

## 2015-11-30 ENCOUNTER — Encounter: Payer: Self-pay | Admitting: Internal Medicine

## 2015-11-30 NOTE — Assessment & Plan Note (Signed)
Has had GI w/up with EGD and colonoscopy.  Follow cbc.

## 2015-11-30 NOTE — Assessment & Plan Note (Signed)
S/p sinus surgery.  Followed by ENT.  Headaches better.  Feels better.  Follow.

## 2015-11-30 NOTE — Assessment & Plan Note (Signed)
Asymptomatic.  Continue risk factor modification.    

## 2015-11-30 NOTE — Assessment & Plan Note (Signed)
Bad cholesterol 99.  Triglycerides 223.  Low carb diet and exercise.  Follow lipid panel.

## 2015-11-30 NOTE — Assessment & Plan Note (Signed)
a1c just checked 8.2.  Pt states now that she is s/p sinus surgery and off abx and prednisone, sugars much better.  Discussed adding medication.  States her am sugars are now averaging 130s and pm sugars 150s.  Desires not to add medication at this time.  Wants to continue to monitor and work on diet and exercise.  Will check and record sugars bid.  Send in readings in the next few weeks.  If persistent elevation , will require additional medication.  States up to date with eye checks.

## 2015-11-30 NOTE — Assessment & Plan Note (Signed)
Diet and exercise.  Follow.  

## 2015-11-30 NOTE — Assessment & Plan Note (Signed)
Blood pressure under good control.  Continue same medication regimen.  Follow pressures.  Follow metabolic panel.   

## 2015-12-10 ENCOUNTER — Other Ambulatory Visit (INDEPENDENT_AMBULATORY_CARE_PROVIDER_SITE_OTHER): Payer: BC Managed Care – PPO

## 2015-12-10 DIAGNOSIS — R7989 Other specified abnormal findings of blood chemistry: Secondary | ICD-10-CM | POA: Diagnosis not present

## 2015-12-10 DIAGNOSIS — R945 Abnormal results of liver function studies: Secondary | ICD-10-CM

## 2015-12-10 LAB — HEPATIC FUNCTION PANEL
ALBUMIN: 3.8 g/dL (ref 3.5–5.2)
ALK PHOS: 76 U/L (ref 39–117)
ALT: 42 U/L — ABNORMAL HIGH (ref 0–35)
AST: 43 U/L — ABNORMAL HIGH (ref 0–37)
BILIRUBIN DIRECT: 0 mg/dL (ref 0.0–0.3)
BILIRUBIN TOTAL: 0.2 mg/dL (ref 0.2–1.2)
TOTAL PROTEIN: 6.9 g/dL (ref 6.0–8.3)

## 2015-12-12 ENCOUNTER — Other Ambulatory Visit: Payer: Self-pay | Admitting: Internal Medicine

## 2015-12-12 DIAGNOSIS — R7989 Other specified abnormal findings of blood chemistry: Secondary | ICD-10-CM

## 2015-12-12 DIAGNOSIS — R945 Abnormal results of liver function studies: Secondary | ICD-10-CM

## 2015-12-16 ENCOUNTER — Ambulatory Visit: Payer: Self-pay | Admitting: Internal Medicine

## 2015-12-29 ENCOUNTER — Encounter: Payer: Self-pay | Admitting: Internal Medicine

## 2016-01-07 ENCOUNTER — Other Ambulatory Visit: Payer: Self-pay | Admitting: Internal Medicine

## 2016-01-11 ENCOUNTER — Other Ambulatory Visit (INDEPENDENT_AMBULATORY_CARE_PROVIDER_SITE_OTHER): Payer: BC Managed Care – PPO

## 2016-01-11 DIAGNOSIS — R7989 Other specified abnormal findings of blood chemistry: Secondary | ICD-10-CM | POA: Diagnosis not present

## 2016-01-11 DIAGNOSIS — R945 Abnormal results of liver function studies: Secondary | ICD-10-CM

## 2016-01-11 LAB — HEPATIC FUNCTION PANEL
ALBUMIN: 4 g/dL (ref 3.5–5.2)
ALT: 40 U/L — AB (ref 0–35)
AST: 46 U/L — ABNORMAL HIGH (ref 0–37)
Alkaline Phosphatase: 82 U/L (ref 39–117)
BILIRUBIN DIRECT: 0.1 mg/dL (ref 0.0–0.3)
TOTAL PROTEIN: 6.5 g/dL (ref 6.0–8.3)
Total Bilirubin: 0.3 mg/dL (ref 0.2–1.2)

## 2016-01-12 LAB — HEPATITIS B CORE ANTIBODY, TOTAL: Hep B Core Total Ab: NONREACTIVE

## 2016-01-12 LAB — HEPATITIS B CORE ANTIBODY, IGM: HEP B C IGM: NONREACTIVE

## 2016-01-12 LAB — HEPATITIS B SURFACE ANTIGEN: HEP B S AG: NEGATIVE

## 2016-01-12 LAB — HEPATITIS C ANTIBODY: HCV AB: NEGATIVE

## 2016-01-12 LAB — HEPATITIS B SURFACE ANTIBODY,QUALITATIVE: HEP B S AB: NEGATIVE

## 2016-01-14 LAB — HEPATITIS B E ANTIGEN: HEPATITIS BE ANTIGEN: NONREACTIVE

## 2016-01-15 ENCOUNTER — Encounter: Payer: Self-pay | Admitting: Internal Medicine

## 2016-01-16 ENCOUNTER — Other Ambulatory Visit: Payer: Self-pay | Admitting: Internal Medicine

## 2016-01-20 ENCOUNTER — Other Ambulatory Visit: Payer: Self-pay | Admitting: Internal Medicine

## 2016-02-06 ENCOUNTER — Other Ambulatory Visit: Payer: Self-pay | Admitting: Internal Medicine

## 2016-03-01 ENCOUNTER — Encounter: Payer: Self-pay | Admitting: Internal Medicine

## 2016-03-01 ENCOUNTER — Ambulatory Visit (INDEPENDENT_AMBULATORY_CARE_PROVIDER_SITE_OTHER): Payer: BC Managed Care – PPO | Admitting: Internal Medicine

## 2016-03-01 VITALS — BP 114/66 | HR 87 | Temp 98.6°F | Ht 64.0 in | Wt 223.4 lb

## 2016-03-01 DIAGNOSIS — E7800 Pure hypercholesterolemia, unspecified: Secondary | ICD-10-CM

## 2016-03-01 DIAGNOSIS — J329 Chronic sinusitis, unspecified: Secondary | ICD-10-CM | POA: Diagnosis not present

## 2016-03-01 DIAGNOSIS — E669 Obesity, unspecified: Secondary | ICD-10-CM | POA: Diagnosis not present

## 2016-03-01 DIAGNOSIS — I10 Essential (primary) hypertension: Secondary | ICD-10-CM | POA: Diagnosis not present

## 2016-03-01 DIAGNOSIS — D649 Anemia, unspecified: Secondary | ICD-10-CM

## 2016-03-01 DIAGNOSIS — I25118 Atherosclerotic heart disease of native coronary artery with other forms of angina pectoris: Secondary | ICD-10-CM

## 2016-03-01 DIAGNOSIS — G4730 Sleep apnea, unspecified: Secondary | ICD-10-CM | POA: Diagnosis not present

## 2016-03-01 DIAGNOSIS — E119 Type 2 diabetes mellitus without complications: Secondary | ICD-10-CM

## 2016-03-01 MED ORDER — AMOXICILLIN-POT CLAVULANATE 875-125 MG PO TABS: 1.0000 | ORAL_TABLET | Freq: Two times a day (BID) | ORAL | 0 refills | Status: DC

## 2016-03-01 MED ORDER — FLUCONAZOLE 150 MG PO TABS
150.0000 mg | ORAL_TABLET | Freq: Once | ORAL | 0 refills | Status: AC
Start: 2016-03-01 — End: 2016-03-01

## 2016-03-01 NOTE — Progress Notes (Signed)
Patient ID: Evelyn James, female   DOB: 1960-12-29, 55 y.o.   MRN: [id number]   Subjective:    Patient ID: Evelyn James, female    DOB: February 16, 1961, 55 y.o.   MRN: [id number]  HPI  Patient here for a scheduled follow up.  S/p sphenoidectomy.  Followed by ENT.  Feels better.  Has been having some increased sinus issues.  Saw ENT mid 01/2016.  Placed on augmentin.  Completed 02/21/16.  Felt better, but symptoms never resolved.  With increased congestion, pressure and drainage.  Using flonase and saline.  Feels needs an extension on her abx.  She has been trying to stay more active.  Trying to watch her diet.  Plans to return to the gym.  No chest pain.  Blood sugars in the am averaging 140-150 and pm sugars averaging 160s.  Saw GI.  With reflux.  Nexium.     Past Medical History:  Diagnosis Date  . Anemia    Noted 11/2011  . Anxiety   . Chest pain    Admitted 11/2011: cath with mild nonobstructive coronary plaque, normal EF, negative cardiac enzymes and negative d-dimer.  . Diabetes mellitus   . Fatty liver disease, nonalcoholic 9166  . GERD (gastroesophageal reflux disease)   . Hypercholesterolemia   . Hypertension   . Reflux   . TMJ dysfunction    Past Surgical History:  Procedure Laterality Date  . ABDOMINAL HYSTERECTOMY    . BILATERAL SALPINGOOPHORECTOMY     benign ovarian tumor  . IMAGE GUIDED SINUS SURGERY N/A 10/22/2015   Procedure: IMAGE GUIDED SINUS SURGERY;  Surgeon: Carloyn Manner, MD;  Location: [facility] ORS;  Service: ENT;  Laterality: N/A;  . KNEE SURGERY    . LEFT HEART CATHETERIZATION WITH CORONARY ANGIOGRAM N/A 12/09/2011   Procedure: LEFT HEART CATHETERIZATION WITH CORONARY ANGIOGRAM;  Surgeon: Minus Breeding, MD;  Location: [facility] CATH LAB;  Service: Cardiovascular;  Laterality: N/A;  . SPHENOIDECTOMY Left 10/22/2015   Procedure: SPHENOIDECTOMY;  Surgeon: Carloyn Manner, MD;  Location: [facility] ORS;  Service: ENT;  Laterality: Left;  . SUBMANDIBULAR MASS EXCISION      Family History  Problem Relation Age of Onset  . Heart disease Father     Father had CABG in his 64s  . Colon polyps Father   . Lung cancer Mother   . Breast cancer Paternal Grandmother   . Arthritis/Rheumatoid Paternal Grandfather    Social History   Social History  . Marital status: Divorced    Spouse name: N/A  . Number of children: 2  . Years of education: N/A   Social History Main Topics  . Smoking status: Never Smoker  . Smokeless tobacco: Never Used  . Alcohol use No  . Drug use: No  . Sexual activity: Not Asked   Other Topics Concern  . None   Social History Narrative  . None    Outpatient Encounter Prescriptions as of 03/01/2016  Medication Sig  . acetaminophen (TYLENOL) 500 MG tablet Take 1,000 mg by mouth every 6 (six) hours as needed for mild pain.  Marland Kitchen BAYER CONTOUR NEXT TEST test strip TEST once daily  . cetirizine (ZYRTEC) 10 MG tablet Take 10 mg by mouth daily.  . cyanocobalamin (,VITAMIN B-12,) 1000 MCG/ML injection INJECT 1 MILLILITER INTO THE MUSCLE EVERY MONTH.  . cyclobenzaprine (FLEXERIL) 10 MG tablet 1/2 - 1 tablet q pm prn (Patient taking differently: Take 5-10 mg by mouth at bedtime as needed for muscle spasms. )  . escitalopram (  LEXAPRO) 10 MG tablet take 2 tablets by mouth once daily  . escitalopram (LEXAPRO) 20 MG tablet take 1 tablet by mouth once daily  . esomeprazole (NEXIUM) 20 MG capsule Take 20 mg by mouth daily at 12 noon.  . Fe Fum-FePoly-Vit C-Vit B3 (INTEGRA) 62.5-62.5-40-3 MG CAPS take 1 capsule by mouth twice a day  . fluticasone (FLONASE) 50 MCG/ACT nasal spray Place 2 sprays into both nostrils daily.  Marland Kitchen HYDROcodone-acetaminophen (NORCO) 5-325 MG tablet Take 1 tablet by mouth every 4 (four) hours as needed for moderate pain.  Marland Kitchen JANUVIA 100 MG tablet take 1 tablet by mouth once daily  . Melatonin 10 MG TABS Take 10 mg by mouth at bedtime as needed (sleep).  . metFORMIN (GLUCOPHAGE) 1000 MG tablet take 1 tablet by mouth twice a  day with meals  . pravastatin (PRAVACHOL) 40 MG tablet take 1 tablet by mouth once daily  . PREMARIN 0.45 MG tablet take 1 tablet by mouth once daily  . Probiotic Product (PHILLIPS COLON HEALTH PO) Take 1 tablet by mouth daily as needed (colon health).   . promethazine (PHENERGAN) 12.5 MG tablet Take 1 tablet (12.5 mg total) by mouth every 6 (six) hours as needed for nausea or vomiting.  . [DISCONTINUED] amoxicillin-clavulanate (AUGMENTIN) 875-125 MG tablet Take 1 tablet by mouth 2 (two) times daily.  . [DISCONTINUED] lisinopril (PRINIVIL,ZESTRIL) 20 MG tablet take 1 tablet by mouth once daily  . amoxicillin-clavulanate (AUGMENTIN) 875-125 MG tablet Take 1 tablet by mouth 2 (two) times daily.  . [EXPIRED] fluconazole (DIFLUCAN) 150 MG tablet Take 1 tablet (150 mg total) by mouth once.   No facility-administered encounter medications on file as of 03/01/2016.     Review of Systems  Constitutional: Negative for appetite change and unexpected weight change.  HENT: Positive for congestion and postnasal drip.   Respiratory: Negative for cough, chest tightness and shortness of breath.   Cardiovascular: Negative for chest pain, palpitations and leg swelling.  Gastrointestinal: Negative for abdominal pain, diarrhea, nausea and vomiting.  Genitourinary: Negative for difficulty urinating and dysuria.  Musculoskeletal: Negative for back pain and joint swelling.  Skin: Negative for color change and rash.  Neurological: Negative for dizziness, light-headedness and headaches.  Psychiatric/Behavioral: Negative for agitation and dysphoric mood.       Objective:    Physical Exam  Constitutional: She appears well-developed and well-nourished. No distress.  HENT:  Nose: Nose normal.  Mouth/Throat: Oropharynx is clear and moist.  Neck: Neck supple. No thyromegaly present.  Cardiovascular: Normal rate and regular rhythm.   Pulmonary/Chest: Breath sounds normal. No respiratory distress. She has no  wheezes.  Abdominal: Soft. Bowel sounds are normal. There is no tenderness.  Musculoskeletal: She exhibits no edema or tenderness.  Lymphadenopathy:    She has no cervical adenopathy.  Skin: No rash noted. No erythema.  Psychiatric: She has a normal mood and affect. Her behavior is normal.    BP 114/66   Pulse 87   Temp 98.6 F (37 C) (Oral)   Ht 5' 4"  (1.626 m)   Wt 223 lb 6.4 oz (101.3 kg)   SpO2 95%   BMI 38.35 kg/m  Wt Readings from Last 3 Encounters:  03/01/16 223 lb 6.4 oz (101.3 kg)  11/26/15 233 lb (105.7 kg)  11/11/15 231 lb 9.6 oz (105.1 kg)     Lab Results  Component Value Date   WBC 8.7 11/23/2015   HGB 11.5 (L) 11/23/2015   HCT 35.0 (L) 11/23/2015   PLT  189.0 11/23/2015   GLUCOSE 165 (H) 11/23/2015   CHOL 177 11/23/2015   TRIG 223.0 (H) 11/23/2015   HDL 49.00 11/23/2015   LDLDIRECT 99.0 11/23/2015   LDLCALC 71 07/07/2015   ALT 40 (H) 01/11/2016   AST 46 (H) 01/11/2016   NA 139 11/23/2015   K 3.8 11/23/2015   CL 100 11/23/2015   CREATININE 0.53 11/23/2015   BUN 9 11/23/2015   CO2 30 11/23/2015   TSH 2.32 10/31/2014   HGBA1C 8.2 (H) 11/23/2015   MICROALBUR 4.4 (H) 11/23/2015       Assessment & Plan:   Problem List Items Addressed This Visit    Anemia    Saw GI recently.  Follow cbc.       Relevant Orders   CBC with Differential/Platelet   Ferritin   CAD (coronary artery disease)    Asymptomatic.  Continue risk factor modification.        Diabetes mellitus ([place])    Low carb diet and exercise.  Sugars as outlined.  Follow met b and a1c.   Lab Results  Component Value Date   HGBA1C 8.2 (H) 11/23/2015        Relevant Orders   Hemoglobin D7B   Basic metabolic panel   Hypercholesterolemia    On pravastatin.  Low cholesterol diet and exercise.  Follow lipid panel and liver function tests.   Lab Results  Component Value Date   CHOL 177 11/23/2015   HDL 49.00 11/23/2015   LDLCALC 71 07/07/2015   LDLDIRECT 99.0 11/23/2015   TRIG  223.0 (H) 11/23/2015   CHOLHDL 4 11/23/2015        Relevant Orders   Lipid panel   Hepatic function panel   Hypertension    Blood pressure under good control.  Continue same medication regimen.  Follow pressures.  Follow metabolic panel.        Relevant Orders   TSH   Obesity (BMI 30-39.9)    Diet and exercise.  Follow.       Sleep apnea    CPAP.        Other Visit Diagnoses    Sinusitis, unspecified chronicity, unspecified location    -  Primary   symptoms as outlined.  recently treated.  symptoms never completely resolved.  augmention extension.  continue flonase/saline.  f/u ent if persistent.    Relevant Medications   amoxicillin-clavulanate (AUGMENTIN) 875-125 MG tablet       Einar Pheasant, MD

## 2016-03-01 NOTE — Progress Notes (Signed)
Pre visit review using our clinic review tool, if applicable. No additional management support is needed unless otherwise documented below in the visit note. 

## 2016-03-03 ENCOUNTER — Other Ambulatory Visit: Payer: Self-pay | Admitting: Internal Medicine

## 2016-03-14 ENCOUNTER — Encounter: Payer: Self-pay | Admitting: Internal Medicine

## 2016-03-14 NOTE — Assessment & Plan Note (Signed)
Diet and exercise.  Follow.  

## 2016-03-14 NOTE — Assessment & Plan Note (Signed)
CPAP.  

## 2016-03-14 NOTE — Assessment & Plan Note (Signed)
On pravastatin.  Low cholesterol diet and exercise.  Follow lipid panel and liver function tests.   Lab Results  Component Value Date   CHOL 177 11/23/2015   HDL 49.00 11/23/2015   LDLCALC 71 07/07/2015   LDLDIRECT 99.0 11/23/2015   TRIG 223.0 (H) 11/23/2015   CHOLHDL 4 11/23/2015

## 2016-03-14 NOTE — Assessment & Plan Note (Signed)
Blood pressure under good control.  Continue same medication regimen.  Follow pressures.  Follow metabolic panel.   

## 2016-03-14 NOTE — Assessment & Plan Note (Signed)
Low carb diet and exercise.  Sugars as outlined.  Follow met b and a1c.   Lab Results  Component Value Date   HGBA1C 8.2 (H) 11/23/2015

## 2016-03-14 NOTE — Assessment & Plan Note (Signed)
Saw GI recently.  Follow cbc.

## 2016-03-14 NOTE — Assessment & Plan Note (Signed)
Asymptomatic.  Continue risk factor modification.    

## 2016-03-24 ENCOUNTER — Telehealth: Payer: Self-pay | Admitting: Internal Medicine

## 2016-03-24 ENCOUNTER — Ambulatory Visit
Admission: EM | Admit: 2016-03-24 | Discharge: 2016-03-24 | Disposition: A | Discharge status: Home / Self Care | Payer: BC Managed Care – PPO | Attending: Family Medicine | Admitting: Family Medicine

## 2016-03-24 DIAGNOSIS — L509 Urticaria, unspecified: Secondary | ICD-10-CM

## 2016-03-24 MED ORDER — PREDNISONE 50 MG PO TABS
60.0000 mg | ORAL_TABLET | Freq: Once | ORAL | Status: AC
  Administered 2016-03-24: 60 mg via ORAL

## 2016-03-24 MED ORDER — PREDNISONE 10 MG PO TABS: ORAL_TABLET | ORAL | 0 refills | Status: DC

## 2016-03-24 MED ORDER — RANITIDINE HCL 150 MG PO TABS: 150.0000 mg | ORAL_TABLET | Freq: Two times a day (BID) | ORAL | 0 refills | Status: DC

## 2016-03-24 NOTE — Discharge Instructions (Signed)
Take medication as prescribed. Continue home Benadryl as needed. Rest. Drink plenty of fluids. Continue to try to identify and avoid triggers.  Follow up with your primary care physician this week. Return to Urgent care or emergency room for new or worsening concerns.

## 2016-03-24 NOTE — Telephone Encounter (Signed)
Since having allergic reaction, I would like for her to be evaluated today - to get treatment.  To acute care today and then we can f/u after.

## 2016-03-24 NOTE — ED Provider Notes (Signed)
[facility] ____________________________________________  Time seen: Approximately 8:29 PM  I have reviewed the triage vital signs and the nursing notes.   HISTORY  Chief Complaint Pruritis   HPI Evelyn James is a 56 y.o. female present for evaluation of intermittent red itchy rash. Patient reports that since this past Monday she has had hives that have gone all over her body, improves with Benadryl and then changes locations when it returns. Reports Benadryl does help but does not fully resolve the rash. Patient should states that the rash is itchy but denies any pain or swelling. States that she did recently start using a new comforter prior to washing as well as eating a new type of chips which she previously not had. Patient states concerned that she's having reaction to to these changes. Denies any other recent changes in foods, medicines, lotions, detergents or other contacts. Patient reports otherwise she feels well.Denies any swelling or sensation of swelling.   Denies chest pain, shortness of breath, abdominal pain, dysuria, extremity pain, extremity swelling. Denies facial swelling, lip swelling, throat swelling or itching, wheezing, other complaints. Patient reports otherwise she feels well and denies other changes. Denies cardiac history. Denies renal insufficiency.Denies history of similar.   Dale DurhamSCOTT, CHARLENE, MD: PCP No LMP recorded. Patient has had a hysterectomy.  Past Medical History:  Diagnosis Date  . Anemia    Noted 11/2011  . Anxiety   . Chest pain    Admitted 11/2011: cath with mild nonobstructive coronary plaque, normal EF, negative cardiac enzymes and negative d-dimer.  . Diabetes mellitus   . Fatty liver disease, nonalcoholic 2014  . GERD (gastroesophageal reflux disease)   . Hypercholesterolemia   . Hypertension   . Reflux   . TMJ dysfunction     Patient Active Problem List   Diagnosis Date Noted  . Left hip pain 07/12/2015  . Epigastric  pain 07/12/2015  . Headache 07/09/2015  . Nausea 02/08/2015  . UTI symptoms 09/18/2014  . Health care maintenance 07/06/2014  . Pelvic pressure in female 07/06/2014  . Dysuria 02/05/2014  . Obesity (BMI 30-39.9) 01/01/2014  . Pain of right side of body 02/26/2013  . Environmental allergies 02/26/2013  . CAD (coronary artery disease) 03/05/2012  . Sleep apnea 03/05/2012  . Hypertension 02/27/2012  . Anemia 02/27/2012  . Hypercholesterolemia 02/27/2012  . Diabetes mellitus ([facility]) 02/27/2012    Past Surgical History:  Procedure Laterality Date  . ABDOMINAL HYSTERECTOMY    . BILATERAL SALPINGOOPHORECTOMY     benign ovarian tumor  . IMAGE GUIDED SINUS SURGERY N/A 10/22/2015   Procedure: IMAGE GUIDED SINUS SURGERY;  Surgeon: Bud Facereighton Vaught, MD;  Location: [facility] ORS;  Service: ENT;  Laterality: N/A;  . KNEE SURGERY    . LEFT HEART CATHETERIZATION WITH CORONARY ANGIOGRAM N/A 12/09/2011   Procedure: LEFT HEART CATHETERIZATION WITH CORONARY ANGIOGRAM;  Surgeon: Rollene RotundaJames Hochrein, MD;  [facility];  Service: Cardiovascular;  Laterality: N/A;  . SPHENOIDECTOMY Left 10/22/2015   Procedure: SPHENOIDECTOMY;  Surgeon: Bud Facereighton Vaught, MD;  Location: [facility] ORS;  Service: ENT;  Laterality: Left;  . SUBMANDIBULAR MASS EXCISION       No current facility-administered medications for this encounter.   Current Outpatient Prescriptions:  .  diphenhydrAMINE (SOMINEX) 25 MG tablet, Take 25 mg by mouth at bedtime as needed for sleep., Disp: , Rfl:  .  acetaminophen (TYLENOL) 500 MG tablet, Take 1,000 mg by mouth every 6 (six) hours as needed for mild pain., Disp: , Rfl:  .  amoxicillin-clavulanate (AUGMENTIN) 875-125 MG tablet, Take 1 tablet by mouth 2 (two) times daily., Disp: 14 tablet, Rfl: 0 .  BAYER CONTOUR NEXT TEST test strip, TEST once daily, Disp: 100 each, Rfl: 9 .  cetirizine (ZYRTEC) 10 MG tablet, Take 10 mg by mouth daily., Disp: , Rfl:  .  cyanocobalamin (,VITAMIN B-12,) 1000 MCG/ML  injection, INJECT 1 MILLILITER INTO THE MUSCLE EVERY MONTH., Disp: 10 mL, Rfl: 1 .  cyclobenzaprine (FLEXERIL) 10 MG tablet, 1/2 - 1 tablet q pm prn (Patient taking differently: Take 5-10 mg by mouth at bedtime as needed for muscle spasms. ), Disp: 30 tablet, Rfl: 0 .  escitalopram (LEXAPRO) 10 MG tablet, take 2 tablets by mouth once daily, Disp: 60 tablet, Rfl: 2 .  escitalopram (LEXAPRO) 20 MG tablet, take 1 tablet by mouth once daily, Disp: 30 tablet, Rfl: 2 .  esomeprazole (NEXIUM) 20 MG capsule, Take 20 mg by mouth daily at 12 noon., Disp: , Rfl:  .  Fe Fum-FePoly-Vit C-Vit B3 (INTEGRA) 62.5-62.5-40-3 MG CAPS, take 1 capsule by mouth twice a day, Disp: 60 capsule, Rfl: 5 .  fluticasone (FLONASE) 50 MCG/ACT nasal spray, Place 2 sprays into both nostrils daily., Disp: 16 g, Rfl: 5 .  HYDROcodone-acetaminophen (NORCO) 5-325 MG tablet, Take 1 tablet by mouth every 4 (four) hours as needed for moderate pain., Disp: 30 tablet, Rfl: 0 .  JANUVIA 100 MG tablet, take 1 tablet by mouth once daily, Disp: 30 tablet, Rfl: 5 .  lisinopril (PRINIVIL,ZESTRIL) 20 MG tablet, take 1 tablet by mouth once daily, Disp: 90 tablet, Rfl: 2 .  Melatonin 10 MG TABS, Take 10 mg by mouth at bedtime as needed (sleep)., Disp: , Rfl:  .  metFORMIN (GLUCOPHAGE) 1000 MG tablet, take 1 tablet by mouth twice a day with meals, Disp: 180 tablet, Rfl: 1 .  pravastatin (PRAVACHOL) 40 MG tablet, take 1 tablet by mouth once daily, Disp: 90 tablet, Rfl: 3 .  predniSONE (DELTASONE) 10 MG tablet, Start 60 mg po day one, then 50 mg po day two, taper by 10 mg daily until complete., Disp: 21 tablet, Rfl: 0 .  PREMARIN 0.45 MG tablet, take 1 tablet by mouth once daily, Disp: 30 tablet, Rfl: 2 .  Probiotic Product (PHILLIPS COLON HEALTH PO), Take 1 tablet by mouth daily as needed (colon health). , Disp: , Rfl:  .  promethazine (PHENERGAN) 12.5 MG tablet, Take 1 tablet (12.5 mg total) by mouth every 6 (six) hours as needed for nausea or  vomiting., Disp: 30 tablet, Rfl: 0 .  ranitidine (ZANTAC) 150 MG tablet, Take 1 tablet (150 mg total) by mouth 2 (two) times daily., Disp: 10 tablet, Rfl: 0  Allergies Sulfa antibiotics  Family History  Problem Relation Age of Onset  . Heart disease Father     Father had CABG in his 50s  . Colon polyps Father   . Lung cancer Mother   . Breast cancer Paternal Grandmother   . Arthritis/Rheumatoid Paternal Grandfather     Social History Social History  Substance Use Topics  . Smoking status: Never Smoker  . Smokeless tobacco: Never Used  . Alcohol use No    Review of Systems Constitutional: No fever/chills Eyes: No visual changes. ENT: No sore throat. Cardiovascular: Denies chest pain. Respiratory: Denies shortness of breath. Gastrointestinal: No abdominal pain.  No nausea, no vomiting.  No diarrhea.  No constipation. Genitourinary: Negative for dysuria. Musculoskeletal: Negative for back pain. Skin: Positive for rash. Neurological: Negative for headaches,  focal weakness or numbness.  10-point ROS otherwise negative.  ____________________________________________   PHYSICAL EXAM:  VITAL SIGNS: ED Triage Vitals [03/24/16 1757]  Enc Vitals Group     BP 139/66     Pulse Rate 86     Resp 20     Temp 98 F (36.7 C)     Temp Source Oral     SpO2 99 %     Weight 226 lb (102.5 kg)     Height 5' 3.5" (1.613 m)     Head Circumference      Peak Flow      Pain Score      Pain Loc      Pain Edu?      Excl. in GC?     Constitutional: Alert and oriented. Well appearing and in no acute distress. Eyes: Conjunctivae are normal. PERRL. EOMI. ENT      Head: Normocephalic and atraumatic.      Nose: No congestion/rhinnorhea.     Mouth/Throat: Mucous membranes are moist.Oropharynx non-erythematous.No tonsillar swelling or exudate. No lip, tongue or oropharyngeal edema noted. Normal voice.No appearance of angioedema. Neck: No stridor. Supple without meningismus.    Hematological/Lymphatic/Immunilogical: No cervical lymphadenopathy. Cardiovascular: Normal rate, regular rhythm. Grossly normal heart sounds.  Good peripheral circulation. Respiratory: Normal respiratory effort without tachypnea nor retractions. Breath sounds are clear and equal bilaterally. No wheezes/rales/rhonchi.. Gastrointestinal: Soft and nontender.  Musculoskeletal:  Nontender with normal range of motion in all extremities. No midline cervical, thoracic or lumbar tenderness to palpation.       Right lower leg:  No tenderness or edema.      Left lower leg:  No tenderness or edema.  Neurologic:  Normal speech and language. Speech is normal. No gait instability.  Skin:  Skin is warm, dry and intact. No rash noted. Except: Erythematous pruritic area of hives located to right posterior knee and leg, nontender, no drainage, no induration or fluctuance, scattered small areas of hives to posterior back with some lines of superficial excoriation appearance consistent with scratch marks. No other rash noted at this time. Psychiatric: Mood and affect are normal. Speech and behavior are normal. Patient exhibits appropriate insight and judgment   ___________________________________________   LABS (all labs ordered are listed, but only abnormal results are displayed)  Labs Reviewed - No data to display  PROCEDURES Procedures   INITIAL IMPRESSION / ASSESSMENT AND PLAN / ED COURSE  Pertinent labs & imaging results that were available during my care of the patient were reviewed by me and considered in my medical decision making (see chart for details).  Well appearing patient. No acute distress. Presents for complaints of intermittent hives. Unsure of exact trigger, patient however reports 2 recent changes in which she is concerned as the trigger. Denies other complaints. Suspect a contact reaction causing intermittent hives. Patient is diabetic controlled by oral agents. Reports last blood sugar  this morning fasting was 160. Reports blood sugars have been well maintained recently. Discussed with patient we'll start patient on oral prednisone taper, oral Zantac and continued intermittent Benadryl at home. Discussed monitoring blood sugar closely in regards to taking oral prednisone. Discussed continuing monitoring for triggers and avoidance of triggers. Discussed with patient to follow-up closely with her primary care physician discussed strict follow-up and return parameters. Patient expressed concern up into her pharmacy in time tonight, 60 mg of oral prednisone given once in urgent care.   Discussed follow up with Primary care physician this week. Discussed follow  up and return parameters including no resolution or any worsening concerns. Patient verbalized understanding and agreed to plan.   ____________________________________________   FINAL CLINICAL IMPRESSION(S) / ED DIAGNOSES  Final diagnoses:  Hives     Discharge Medication List as of 03/24/2016  8:36 PM    START taking these medications   Details  predniSONE (DELTASONE) 10 MG tablet Start 60 mg po day one, then 50 mg po day two, taper by 10 mg daily until complete., Normal    ranitidine (ZANTAC) 150 MG tablet Take 1 tablet (150 mg total) by mouth 2 (two) times daily., Starting Thu 03/24/2016, Normal        Note: This dictation was prepared with Dragon dictation along with smaller phrase technology. Any transcriptional errors that result from this process are unintentional.    Clinical Course       Renford Dills, NP 03/24/16 2351    Renford Dills, NP 03/24/16 2352

## 2016-03-24 NOTE — ED Triage Notes (Signed)
Pt states she started on Monday with hives all her body. Has been taking Benadryl with little relief. No throat or respiratory involvement.

## 2016-03-24 NOTE — Telephone Encounter (Signed)
PT called back stating that [facility] does not have any appts. Please advise, thank you!  Call pt @ [phone]

## 2016-03-24 NOTE — Telephone Encounter (Signed)
Left message to call on voicemail  

## 2016-03-24 NOTE — Telephone Encounter (Signed)
Pt called c/o of hives. She has had them since Monday they are over her entire body, she is not sure what they are from. She is very itchy, and benadryl is not working. Please advise, thank you!  Call pt @ [phone]

## 2016-03-24 NOTE — Telephone Encounter (Signed)
Patient advised of below and verbalized understanding . She is going to urgent care tonight for evaluation.

## 2016-03-24 NOTE — Telephone Encounter (Signed)
Reason for call:hives  Symptoms:legs, feet, hands , arms, back, scalp Duration Monday Medications:Benadry every, 4 hr, no trouble breathing, no trouble  swallowing,  Last seen for this problem: Seen by:NA [facility] to see if she can get appointment.

## 2016-03-25 ENCOUNTER — Encounter: Payer: Self-pay | Admitting: Internal Medicine

## 2016-04-06 ENCOUNTER — Encounter: Payer: Self-pay | Admitting: Internal Medicine

## 2016-04-07 MED ORDER — ESCITALOPRAM OXALATE 10 MG PO TABS: ORAL_TABLET | ORAL | 0 refills | Status: DC

## 2016-04-07 NOTE — Telephone Encounter (Signed)
rx sent in for lexapro.

## 2016-04-11 ENCOUNTER — Encounter: Payer: Self-pay | Admitting: Family

## 2016-04-11 ENCOUNTER — Ambulatory Visit (INDEPENDENT_AMBULATORY_CARE_PROVIDER_SITE_OTHER): Payer: BC Managed Care – PPO | Admitting: Family

## 2016-04-11 VITALS — BP 127/60 | HR 103 | Temp 98.2°F | Ht 63.5 in | Wt 224.6 lb

## 2016-04-11 DIAGNOSIS — B379 Candidiasis, unspecified: Secondary | ICD-10-CM | POA: Diagnosis not present

## 2016-04-11 DIAGNOSIS — J029 Acute pharyngitis, unspecified: Secondary | ICD-10-CM

## 2016-04-11 DIAGNOSIS — T3695XA Adverse effect of unspecified systemic antibiotic, initial encounter: Secondary | ICD-10-CM

## 2016-04-11 DIAGNOSIS — L0291 Cutaneous abscess, unspecified: Secondary | ICD-10-CM

## 2016-04-11 MED ORDER — MUPIROCIN 2 % EX OINT: 1.0000 "application " | TOPICAL_OINTMENT | Freq: Two times a day (BID) | CUTANEOUS | 2 refills | Status: DC

## 2016-04-11 MED ORDER — CEPHALEXIN 500 MG PO CAPS: 500.0000 mg | ORAL_CAPSULE | Freq: Two times a day (BID) | ORAL | 0 refills | Status: AC

## 2016-04-11 MED ORDER — FLUCONAZOLE 150 MG PO TABS: 150.0000 mg | ORAL_TABLET | Freq: Once | ORAL | 1 refills | Status: AC

## 2016-04-11 NOTE — Progress Notes (Addendum)
Subjective:    Patient ID: Evelyn James, female    DOB: 10/23/1960, 56 y.o.   MRN: [id number]  CC: Evelyn James is a 56 y.o. female who presents today for an acute visit.    HPI: Chief complaint; sore throat couple of weeks, worsening. Some sinus drainage, right ear pain, post nasal drip. Using normal saline and aleve with relief. Hasn't used flonase.  No fever, cough.'Still not horrible.'    Also complains of cyst in vagina, bigger the last couple days draining. 'always have them'. No MRSA. Haven't tried any medications. No vaginal complaints. No fever.      HISTORY:  Past Medical History:  Diagnosis Date  . Anemia    Noted 11/2011  . Anxiety   . Chest pain    Admitted 11/2011: cath with mild nonobstructive coronary plaque, normal EF, negative cardiac enzymes and negative d-dimer.  . Diabetes mellitus   . Fatty liver disease, nonalcoholic 2014  . GERD (gastroesophageal reflux disease)   . Hypercholesterolemia   . Hypertension   . Reflux   . TMJ dysfunction    Past Surgical History:  Procedure Laterality Date  . ABDOMINAL HYSTERECTOMY    . BILATERAL SALPINGOOPHORECTOMY     benign ovarian tumor  . IMAGE GUIDED SINUS SURGERY N/A 10/22/2015   Procedure: IMAGE GUIDED SINUS SURGERY;  Surgeon: Bud Face, MD;  Location: [facility] ORS;  Service: ENT;  Laterality: N/A;  . KNEE SURGERY    . LEFT HEART CATHETERIZATION WITH CORONARY ANGIOGRAM N/A 12/09/2011   Procedure: LEFT HEART CATHETERIZATION WITH CORONARY ANGIOGRAM;  Surgeon: Rollene Rotunda, MD;  Location: [facility] CATH LAB;  Service: Cardiovascular;  Laterality: N/A;  . SPHENOIDECTOMY Left 10/22/2015   Procedure: SPHENOIDECTOMY;  Surgeon: Bud Face, MD;  Location: [facility] ORS;  Service: ENT;  Laterality: Left;  . SUBMANDIBULAR MASS EXCISION     Family History  Problem Relation Age of Onset  . Heart disease Father     Father had CABG in his 11s  . Colon polyps Father   . Lung cancer Mother   . Breast cancer Paternal  Grandmother   . Arthritis/Rheumatoid Paternal Grandfather     Allergies: Sulfa antibiotics Current Outpatient Prescriptions on File Prior to Visit  Medication Sig Dispense Refill  . acetaminophen (TYLENOL) 500 MG tablet Take 1,000 mg by mouth every 6 (six) hours as needed for mild pain.    Marland Kitchen BAYER CONTOUR NEXT TEST test strip TEST once daily 100 each 9  . cetirizine (ZYRTEC) 10 MG tablet Take 10 mg by mouth daily.    . cyanocobalamin (,VITAMIN B-12,) 1000 MCG/ML injection INJECT 1 MILLILITER INTO THE MUSCLE EVERY MONTH. 10 mL 1  . cyclobenzaprine (FLEXERIL) 10 MG tablet 1/2 - 1 tablet q pm prn (Patient taking differently: Take 5-10 mg by mouth at bedtime as needed for muscle spasms. ) 30 tablet 0  . diphenhydrAMINE (SOMINEX) 25 MG tablet Take 25 mg by mouth at bedtime as needed for sleep.    Marland Kitchen escitalopram (LEXAPRO) 20 MG tablet take 1 tablet by mouth once daily 30 tablet 2  . esomeprazole (NEXIUM) 20 MG capsule Take 20 mg by mouth daily at 12 noon.    . Fe Fum-FePoly-Vit C-Vit B3 (INTEGRA) 62.5-62.5-40-3 MG CAPS take 1 capsule by mouth twice a day 60 capsule 5  . fluticasone (FLONASE) 50 MCG/ACT nasal spray Place 2 sprays into both nostrils daily. 16 g 5  . HYDROcodone-acetaminophen (NORCO) 5-325 MG tablet Take 1 tablet by mouth every 4 (four)  hours as needed for moderate pain. 30 tablet 0  . JANUVIA 100 MG tablet take 1 tablet by mouth once daily 30 tablet 5  . lisinopril (PRINIVIL,ZESTRIL) 20 MG tablet take 1 tablet by mouth once daily 90 tablet 2  . Melatonin 10 MG TABS Take 10 mg by mouth at bedtime as needed (sleep).    . metFORMIN (GLUCOPHAGE) 1000 MG tablet take 1 tablet by mouth twice a day with meals 180 tablet 1  . pravastatin (PRAVACHOL) 40 MG tablet take 1 tablet by mouth once daily 90 tablet 3  . predniSONE (DELTASONE) 10 MG tablet Start 60 mg po day one, then 50 mg po day two, taper by 10 mg daily until complete. 21 tablet 0  . PREMARIN 0.45 MG tablet take 1 tablet by mouth  once daily 30 tablet 2  . Probiotic Product (PHILLIPS COLON HEALTH PO) Take 1 tablet by mouth daily as needed (colon health).     . promethazine (PHENERGAN) 12.5 MG tablet Take 1 tablet (12.5 mg total) by mouth every 6 (six) hours as needed for nausea or vomiting. 30 tablet 0  . ranitidine (ZANTAC) 150 MG tablet Take 1 tablet (150 mg total) by mouth 2 (two) times daily. 10 tablet 0   No current facility-administered medications on file prior to visit.     Social History  Substance Use Topics  . Smoking status: Never Smoker  . Smokeless tobacco: Never Used  . Alcohol use No    Review of Systems  Constitutional: Negative for chills and fever.  HENT: Positive for ear pain, postnasal drip and sinus pressure. Negative for congestion.   Respiratory: Negative for cough, shortness of breath and wheezing.   Cardiovascular: Negative for chest pain and palpitations.  Gastrointestinal: Negative for nausea and vomiting.  Genitourinary: Negative for vaginal bleeding and vaginal discharge.  Skin: Positive for wound.      Objective:    BP 127/60   Pulse (!) 103   Temp 98.2 F (36.8 C) (Oral)   Ht 5' 3.5" (1.613 m)   Wt 224 lb 9.6 oz (101.9 kg)   SpO2 97%   BMI 39.16 kg/m    Physical Exam  Constitutional: She appears well-developed and well-nourished.  HENT:  Head: Normocephalic and atraumatic.  Right Ear: Hearing, tympanic membrane, external ear and ear canal normal. No drainage, swelling or tenderness. No foreign bodies. Tympanic membrane is not erythematous and not bulging. No middle ear effusion. No decreased hearing is noted.  Left Ear: Hearing, tympanic membrane, external ear and ear canal normal. No drainage, swelling or tenderness. No foreign bodies. Tympanic membrane is not erythematous and not bulging.  No middle ear effusion. No decreased hearing is noted.  Nose: Nose normal. No rhinorrhea. Right sinus exhibits no maxillary sinus tenderness and no frontal sinus tenderness. Left  sinus exhibits no maxillary sinus tenderness and no frontal sinus tenderness.  Mouth/Throat: Uvula is midline and mucous membranes are normal. Posterior oropharyngeal erythema present. No oropharyngeal exudate, posterior oropharyngeal edema or tonsillar abscesses.  Eyes: Conjunctivae are normal.  Cardiovascular: Regular rhythm, normal heart sounds and normal pulses.   Pulmonary/Chest: Effort normal and breath sounds normal. She has no wheezes. She has no rhonchi. She has no rales.  Genitourinary:     Genitourinary Comments: Abscess as noted on diagram.   Lymphadenopathy:       Head (right side): No submental, no submandibular, no tonsillar, no preauricular, no posterior auricular and no occipital adenopathy present.  Head (left side): No submental, no submandibular, no tonsillar, no preauricular, no posterior auricular and no occipital adenopathy present.    She has no cervical adenopathy.  Neurological: She is alert.  Skin: Skin is warm and dry.  Psychiatric: She has a normal mood and affect. Her speech is normal and behavior is normal. Thought content normal.  Vitals reviewed.   Patient does not have previous history of MRSA.  Patient does have diabetes.   The patient gave informed consent for the procedure. Patient and I discussed risks, benefits, and alternatives to I & D.  Including risk of infection from laceration, localized pain, and bleeding. We discussed the option for oral antibiotics. Patient verbalized understanding to conversation and all questions were answered. We jointly decided to proceed with procedure.  On exam, there is a 3 cm indurated abscess, spontaneously draining left labia.  It is tender to palpation.  There is erythema with purulent yellow, bloody discharge.  The patient gave informed consent for the procedure. The area was prepped with antiseptic solution. Declined lidocaine.  #11 blade was used to make small pin point incision in which drainage came  spontaneously.    Bleeding from the wound was controlled by applying direct pressure with gauze.   Wound culture was obtained.  The patient tolerated the procedure well.  Extensive instructions were given to the patient.   Complications: None  Take antibiotic as directed.  Follow-up for wound check in 2-3 days.     Assessment & Plan:  1. Vaginal abscess Afebrile. Localized. Fluctuant and with ease facilitated further drainage. Encouraged warm soaks and oral antibiotic. Return precautions given.  - cephALEXin (KEFLEX) 500 MG capsule; Take 1 capsule (500 mg total) by mouth 2 (two) times daily.  Dispense: 20 capsule; Refill: 0 - mupirocin ointment (BACTROBAN) 2 %; Place 1 application into the nose 2 (two) times daily.  Dispense: 22 g; Refill: 2 - WOUND CULTURE  2. Sore throat Afebrile. Due to duration of symptoms, antibiotic appropriate. We'll use Keflex cover both skin infection and sore throat. - cephALEXin (KEFLEX) 500 MG capsule; Take 1 capsule (500 mg total) by mouth 2 (two) times daily.  Dispense: 20 capsule; Refill: 0  3. Antibiotic-induced yeast infection Given if needed. Encouraged probiotics.  - fluconazole (DIFLUCAN) 150 MG tablet; Take 1 tablet (150 mg total) by mouth once. Take one tablet PO once. If continue to have symptoms, may take one tablet PO 3 days later.  Dispense: 2 tablet; Refill: 1     I have discontinued Ms. Walker's amoxicillin-clavulanate. I am also having her start on cephALEXin, mupirocin ointment, and fluconazole. Additionally, I am having her maintain her cetirizine, fluticasone, Probiotic Product (PHILLIPS COLON HEALTH PO), cyanocobalamin, cyclobenzaprine, BAYER CONTOUR NEXT TEST, esomeprazole, acetaminophen, Melatonin, promethazine, HYDROcodone-acetaminophen, pravastatin, PREMARIN, escitalopram, metFORMIN, JANUVIA, INTEGRA, lisinopril, diphenhydrAMINE, predniSONE, and ranitidine.   Meds ordered this encounter  Medications  . cephALEXin (KEFLEX)  500 MG capsule    Sig: Take 1 capsule (500 mg total) by mouth 2 (two) times daily.    Dispense:  20 capsule    Refill:  0    Order Specific Question:   Supervising Provider    Answer:   Duncan Dull L [[id number]]  . mupirocin ointment (BACTROBAN) 2 %    Sig: Place 1 application into the nose 2 (two) times daily.    Dispense:  22 g    Refill:  2    Order Specific Question:   Supervising Provider    Answer:  TULLO, TERESA L [[id number]]  . fluconazole (DIFLUCAN) 150 MG tablet    Sig: Take 1 tablet (150 mg total) by mouth once. Take one tablet PO once. If continue to have symptoms, may take one tablet PO 3 days later.    Dispense:  2 tablet    Refill:  1    Order Specific Question:   Supervising Provider    Answer:   Sherlene Shams [[id number]]    Return precautions given.   Risks, benefits, and alternatives of the medications and treatment plan prescribed today were discussed, and patient expressed understanding.   Education regarding symptom management and diagnosis given to patient on AVS.  Continue to follow with Dale Butterfield, MD for routine health maintenance.   Evelyn James and I agreed with plan.   Rennie Plowman, FNP

## 2016-04-11 NOTE — Progress Notes (Signed)
Pre visit review using our clinic review tool, if applicable. No additional management support is needed unless otherwise documented below in the visit note. 

## 2016-04-11 NOTE — Patient Instructions (Addendum)
Get back on Flonase for postnasal drip.  Bactroban ointment may use if cyst recurs  Warm soaks to faciliate drainage  If there is no improvement in your symptoms, or if there is any worsening of symptoms, or if you have any additional concerns, please return for re-evaluation; or, if we are closed, consider going to the Emergency Room for evaluation if symptoms urgent.

## 2016-04-12 ENCOUNTER — Encounter: Payer: Self-pay | Admitting: Internal Medicine

## 2016-04-12 ENCOUNTER — Other Ambulatory Visit (INDEPENDENT_AMBULATORY_CARE_PROVIDER_SITE_OTHER): Payer: BC Managed Care – PPO

## 2016-04-12 DIAGNOSIS — E119 Type 2 diabetes mellitus without complications: Secondary | ICD-10-CM | POA: Diagnosis not present

## 2016-04-12 DIAGNOSIS — I10 Essential (primary) hypertension: Secondary | ICD-10-CM

## 2016-04-12 DIAGNOSIS — E7800 Pure hypercholesterolemia, unspecified: Secondary | ICD-10-CM

## 2016-04-12 DIAGNOSIS — D649 Anemia, unspecified: Secondary | ICD-10-CM | POA: Diagnosis not present

## 2016-04-12 LAB — CBC WITH DIFFERENTIAL/PLATELET
BASOS PCT: 0.6 % (ref 0.0–3.0)
Basophils Absolute: 0.1 10*3/uL (ref 0.0–0.1)
EOS PCT: 2.4 % (ref 0.0–5.0)
Eosinophils Absolute: 0.2 10*3/uL (ref 0.0–0.7)
HEMATOCRIT: 35.1 % — AB (ref 36.0–46.0)
HEMOGLOBIN: 11.4 g/dL — AB (ref 12.0–15.0)
LYMPHS PCT: 27.3 % (ref 12.0–46.0)
Lymphs Abs: 2.5 10*3/uL (ref 0.7–4.0)
MCHC: 32.5 g/dL (ref 30.0–36.0)
MCV: 85.8 fl (ref 78.0–100.0)
MONOS PCT: 5.8 % (ref 3.0–12.0)
Monocytes Absolute: 0.5 10*3/uL (ref 0.1–1.0)
NEUTROS ABS: 5.9 10*3/uL (ref 1.4–7.7)
Neutrophils Relative %: 63.9 % (ref 43.0–77.0)
PLATELETS: 179 10*3/uL (ref 150.0–400.0)
RBC: 4.09 Mil/uL (ref 3.87–5.11)
RDW: 14.6 % (ref 11.5–15.5)
WBC: 9.2 10*3/uL (ref 4.0–10.5)

## 2016-04-12 LAB — BASIC METABOLIC PANEL
BUN: 12 mg/dL (ref 6–23)
CALCIUM: 8.9 mg/dL (ref 8.4–10.5)
CHLORIDE: 101 meq/L (ref 96–112)
CO2: 32 meq/L (ref 19–32)
Creatinine, Ser: 0.57 mg/dL (ref 0.40–1.20)
GFR: 116.15 mL/min (ref >60.00)
Glucose, Bld: 249 mg/dL — ABNORMAL HIGH (ref 70–99)
Potassium: 4.5 mEq/L (ref 3.5–5.1)
SODIUM: 138 meq/L (ref 135–145)

## 2016-04-12 LAB — LIPID PANEL
Cholesterol: 166 mg/dL (ref 0–200)
HDL: 55.5 mg/dL (ref >39.00)
LDL CALC: 71 mg/dL (ref 0–99)
NONHDL: 110.63
Total CHOL/HDL Ratio: 3
Triglycerides: 196 mg/dL — ABNORMAL HIGH (ref 0.0–149.0)
VLDL: 39.2 mg/dL (ref 0.0–40.0)

## 2016-04-12 LAB — HEMOGLOBIN A1C: HEMOGLOBIN A1C: 8.5 % — AB (ref 4.6–6.5)

## 2016-04-12 LAB — HEPATIC FUNCTION PANEL
ALBUMIN: 3.9 g/dL (ref 3.5–5.2)
ALT: 38 U/L — ABNORMAL HIGH (ref 0–35)
AST: 21 U/L (ref 0–37)
Alkaline Phosphatase: 70 U/L (ref 39–117)
BILIRUBIN TOTAL: 0.2 mg/dL (ref 0.2–1.2)
Bilirubin, Direct: 0 mg/dL (ref 0.0–0.3)
Total Protein: 6.3 g/dL (ref 6.0–8.3)

## 2016-04-12 LAB — FERRITIN: Ferritin: 42.8 ng/mL (ref 10.0–291.0)

## 2016-04-12 LAB — TSH: TSH: 2.71 u[IU]/mL (ref 0.35–4.50)

## 2016-04-13 ENCOUNTER — Encounter: Payer: Self-pay | Admitting: *Deleted

## 2016-04-13 DIAGNOSIS — E119 Type 2 diabetes mellitus without complications: Secondary | ICD-10-CM

## 2016-04-14 ENCOUNTER — Other Ambulatory Visit: Payer: Self-pay | Admitting: Internal Medicine

## 2016-04-14 LAB — WOUND CULTURE
GRAM STAIN: NONE SEEN
Gram Stain: NONE SEEN
Organism ID, Bacteria: NO GROWTH

## 2016-04-15 NOTE — Telephone Encounter (Signed)
Order placed for endocrinology referral.  

## 2016-05-04 ENCOUNTER — Other Ambulatory Visit: Payer: Self-pay | Admitting: Internal Medicine

## 2016-05-05 NOTE — Telephone Encounter (Signed)
Pt last refill on Lexapro was on 12/21/15 and last OV with you was on 03/01/16. Ok to refill?

## 2016-06-20 ENCOUNTER — Encounter: Payer: Self-pay | Admitting: Internal Medicine

## 2016-06-20 DIAGNOSIS — E7800 Pure hypercholesterolemia, unspecified: Secondary | ICD-10-CM

## 2016-06-20 DIAGNOSIS — E119 Type 2 diabetes mellitus without complications: Secondary | ICD-10-CM

## 2016-06-20 DIAGNOSIS — I10 Essential (primary) hypertension: Secondary | ICD-10-CM

## 2016-06-20 DIAGNOSIS — D649 Anemia, unspecified: Secondary | ICD-10-CM

## 2016-06-21 NOTE — Telephone Encounter (Signed)
Would you like labs before patient Physical?

## 2016-06-22 NOTE — Telephone Encounter (Signed)
I have ordered the labs.  Please schedule pt a fasting lab appt prior to her physical.

## 2016-06-22 NOTE — Telephone Encounter (Signed)
Labs ordered.

## 2016-06-27 NOTE — Telephone Encounter (Signed)
Please schedule patient fasting lab appointment. 

## 2016-07-08 ENCOUNTER — Other Ambulatory Visit (INDEPENDENT_AMBULATORY_CARE_PROVIDER_SITE_OTHER): Payer: BC Managed Care – PPO

## 2016-07-08 DIAGNOSIS — D649 Anemia, unspecified: Secondary | ICD-10-CM | POA: Diagnosis not present

## 2016-07-08 DIAGNOSIS — E119 Type 2 diabetes mellitus without complications: Secondary | ICD-10-CM

## 2016-07-08 DIAGNOSIS — I10 Essential (primary) hypertension: Secondary | ICD-10-CM | POA: Diagnosis not present

## 2016-07-08 DIAGNOSIS — E7800 Pure hypercholesterolemia, unspecified: Secondary | ICD-10-CM | POA: Diagnosis not present

## 2016-07-08 LAB — HEPATIC FUNCTION PANEL
ALK PHOS: 68 U/L (ref 39–117)
ALT: 35 U/L (ref 0–35)
AST: 32 U/L (ref 0–37)
Albumin: 4 g/dL (ref 3.5–5.2)
BILIRUBIN DIRECT: 0 mg/dL (ref 0.0–0.3)
TOTAL PROTEIN: 6.5 g/dL (ref 6.0–8.3)
Total Bilirubin: 0.3 mg/dL (ref 0.2–1.2)

## 2016-07-08 LAB — LIPID PANEL
Cholesterol: 182 mg/dL (ref 0–200)
HDL: 57 mg/dL (ref >39.00)
NONHDL: 124.88
Total CHOL/HDL Ratio: 3
Triglycerides: 205 mg/dL — ABNORMAL HIGH (ref 0.0–149.0)
VLDL: 41 mg/dL — ABNORMAL HIGH (ref 0.0–40.0)

## 2016-07-08 LAB — BASIC METABOLIC PANEL
BUN: 13 mg/dL (ref 6–23)
CALCIUM: 9.2 mg/dL (ref 8.4–10.5)
CHLORIDE: 101 meq/L (ref 96–112)
CO2: 29 meq/L (ref 19–32)
Creatinine, Ser: 0.6 mg/dL (ref 0.40–1.20)
GFR: 109.38 mL/min (ref >60.00)
Glucose, Bld: 123 mg/dL — ABNORMAL HIGH (ref 70–99)
Potassium: 4.6 mEq/L (ref 3.5–5.1)
SODIUM: 139 meq/L (ref 135–145)

## 2016-07-08 LAB — CBC WITH DIFFERENTIAL/PLATELET
BASOS PCT: 0.6 % (ref 0.0–3.0)
Basophils Absolute: 0.1 10*3/uL (ref 0.0–0.1)
EOS ABS: 0.2 10*3/uL (ref 0.0–0.7)
EOS PCT: 2.3 % (ref 0.0–5.0)
HCT: 39.1 % (ref 36.0–46.0)
Hemoglobin: 12.5 g/dL (ref 12.0–15.0)
Lymphocytes Relative: 35.8 % (ref 12.0–46.0)
Lymphs Abs: 3.1 10*3/uL (ref 0.7–4.0)
MCHC: 32 g/dL (ref 30.0–36.0)
MCV: 85.4 fl (ref 78.0–100.0)
Monocytes Absolute: 0.5 10*3/uL (ref 0.1–1.0)
Monocytes Relative: 5.6 % (ref 3.0–12.0)
NEUTROS ABS: 4.8 10*3/uL (ref 1.4–7.7)
Neutrophils Relative %: 55.7 % (ref 43.0–77.0)
PLATELETS: 213 10*3/uL (ref 150.0–400.0)
RBC: 4.58 Mil/uL (ref 3.87–5.11)
RDW: 14 % (ref 11.5–15.5)
WBC: 8.6 10*3/uL (ref 4.0–10.5)

## 2016-07-08 LAB — HEMOGLOBIN A1C: Hgb A1c MFr Bld: 7.6 % — ABNORMAL HIGH (ref 4.6–6.5)

## 2016-07-08 LAB — FERRITIN: Ferritin: 26.5 ng/mL (ref 10.0–291.0)

## 2016-07-08 LAB — LDL CHOLESTEROL, DIRECT: Direct LDL: 102 mg/dL

## 2016-07-12 ENCOUNTER — Ambulatory Visit (INDEPENDENT_AMBULATORY_CARE_PROVIDER_SITE_OTHER): Payer: BC Managed Care – PPO | Admitting: Internal Medicine

## 2016-07-12 ENCOUNTER — Encounter: Payer: Self-pay | Admitting: Internal Medicine

## 2016-07-12 VITALS — BP 128/62 | HR 92 | Temp 98.8°F | Resp 12 | Ht 63.0 in | Wt 217.0 lb

## 2016-07-12 DIAGNOSIS — G4730 Sleep apnea, unspecified: Secondary | ICD-10-CM | POA: Diagnosis not present

## 2016-07-12 DIAGNOSIS — E669 Obesity, unspecified: Secondary | ICD-10-CM

## 2016-07-12 DIAGNOSIS — Z0000 Encounter for general adult medical examination without abnormal findings: Secondary | ICD-10-CM | POA: Diagnosis not present

## 2016-07-12 DIAGNOSIS — D649 Anemia, unspecified: Secondary | ICD-10-CM | POA: Diagnosis not present

## 2016-07-12 DIAGNOSIS — E119 Type 2 diabetes mellitus without complications: Secondary | ICD-10-CM

## 2016-07-12 DIAGNOSIS — Z124 Encounter for screening for malignant neoplasm of cervix: Secondary | ICD-10-CM

## 2016-07-12 DIAGNOSIS — E7800 Pure hypercholesterolemia, unspecified: Secondary | ICD-10-CM

## 2016-07-12 DIAGNOSIS — I25118 Atherosclerotic heart disease of native coronary artery with other forms of angina pectoris: Secondary | ICD-10-CM

## 2016-07-12 DIAGNOSIS — I10 Essential (primary) hypertension: Secondary | ICD-10-CM | POA: Diagnosis not present

## 2016-07-12 NOTE — Progress Notes (Signed)
Pre-visit discussion using our clinic review tool. No additional management support is needed unless otherwise documented below in the visit note.  

## 2016-07-12 NOTE — Progress Notes (Signed)
Patient ID: [name], female   DOB: Mar 16, 1960, 56 y.o.   MRN: [id number]   Subjective:    Patient ID: Evelyn James, female    DOB: September 20, 1960, 56 y.o.   MRN: [id number]  HPI  Patient here for her physical exam.  She is watching her diet.  Exercising.  Losing weight.  Handling stress.  Seeing Dr Tedd Sias for her diabetes.  On trulicity and farxiga.  States am sugars averaging 120s and pm sugars 140.  No chest pain.  No sob.  No acid reflux.  No abdominal pain.  Bowels stable.     Past Medical History:  Diagnosis Date  . Anemia    Noted 11/2011  . Anxiety   . Chest pain    Admitted 11/2011: cath with mild nonobstructive coronary plaque, normal EF, negative cardiac enzymes and negative d-dimer.  . Diabetes mellitus   . Fatty liver disease, nonalcoholic 2014  . GERD (gastroesophageal reflux disease)   . Hypercholesterolemia   . Hypertension   . Reflux   . TMJ dysfunction    Past Surgical History:  Procedure Laterality Date  . ABDOMINAL HYSTERECTOMY    . BILATERAL SALPINGOOPHORECTOMY     benign ovarian tumor  . IMAGE GUIDED SINUS SURGERY N/A 10/22/2015   Procedure: IMAGE GUIDED SINUS SURGERY;  Surgeon: Bud Face, MD;  Location: [facility] ORS;  Service: ENT;  Laterality: N/A;  . KNEE SURGERY    . LEFT HEART CATHETERIZATION WITH CORONARY ANGIOGRAM N/A 12/09/2011   Procedure: LEFT HEART CATHETERIZATION WITH CORONARY ANGIOGRAM;  Surgeon: Rollene Rotunda, MD;  Location: [facility] CATH LAB;  Service: Cardiovascular;  Laterality: N/A;  . SPHENOIDECTOMY Left 10/22/2015   Procedure: SPHENOIDECTOMY;  Surgeon: Bud Face, MD;  Location: [facility] ORS;  Service: ENT;  Laterality: Left;  . SUBMANDIBULAR MASS EXCISION     Family History  Problem Relation Age of Onset  . Heart disease Father     Father had CABG in his 50s  . Colon polyps Father   . Lung cancer Mother   . Breast cancer Paternal Grandmother   . Arthritis/Rheumatoid Paternal Grandfather    Social History   Social History  .  Marital status: Divorced    Spouse name: N/A  . Number of children: 2  . Years of education: N/A   Social History Main Topics  . Smoking status: Never Smoker  . Smokeless tobacco: Never Used  . Alcohol use No  . Drug use: No  . Sexual activity: Not Asked   Other Topics Concern  . None   Social History Narrative  . None    Outpatient Encounter Prescriptions as of 07/12/2016  Medication Sig  . acetaminophen (TYLENOL) 500 MG tablet Take 1,000 mg by mouth every 6 (six) hours as needed for mild pain.  Marland Kitchen BAYER CONTOUR NEXT TEST test strip TEST once daily  . cetirizine (ZYRTEC) 10 MG tablet Take 10 mg by mouth daily.  . cyanocobalamin (,VITAMIN B-12,) 1000 MCG/ML injection INJECT 1 MILLILITER INTO THE MUSCLE EVERY MONTH.  . cyclobenzaprine (FLEXERIL) 10 MG tablet 1/2 - 1 tablet q pm prn (Patient taking differently: Take 5-10 mg by mouth at bedtime as needed for muscle spasms. )  . dapagliflozin propanediol (FARXIGA) 10 MG TABS tablet Take 10 mg by mouth daily.  . diphenhydrAMINE (SOMINEX) 25 MG tablet Take 25 mg by mouth at bedtime as needed for sleep.  Marland Kitchen escitalopram (LEXAPRO) 20 MG tablet take 1 tablet by mouth once daily  . escitalopram (LEXAPRO) 20 MG tablet  take 1 tablet by mouth once daily  . esomeprazole (NEXIUM) 20 MG capsule Take 20 mg by mouth daily at 12 noon.  . Fe Fum-FePoly-Vit C-Vit B3 (INTEGRA) 62.5-62.5-40-3 MG CAPS take 1 capsule by mouth twice a day  . fluticasone (FLONASE) 50 MCG/ACT nasal spray Place 2 sprays into both nostrils daily.  Marland Kitchen HYDROcodone-acetaminophen (NORCO) 5-325 MG tablet Take 1 tablet by mouth every 4 (four) hours as needed for moderate pain.  Marland Kitchen lisinopril (PRINIVIL,ZESTRIL) 20 MG tablet take 1 tablet by mouth once daily  . Melatonin 10 MG TABS Take 10 mg by mouth at bedtime as needed (sleep).  . metFORMIN (GLUCOPHAGE) 1000 MG tablet take 1 tablet by mouth twice a day with meals  . mupirocin ointment (BACTROBAN) 2 % Place 1 application into the nose 2  (two) times daily.  . pravastatin (PRAVACHOL) 40 MG tablet take 1 tablet by mouth once daily  . Probiotic Product (PHILLIPS COLON HEALTH PO) Take 1 tablet by mouth daily as needed (colon health).   . TRULICITY 1.5 MG/0.5ML SOPN inject 1.5 milligrams subcutaneously every 7 days  . [DISCONTINUED] PREMARIN 0.45 MG tablet take 1 tablet by mouth once daily  . [DISCONTINUED] JANUVIA 100 MG tablet take 1 tablet by mouth once daily  . [DISCONTINUED] predniSONE (DELTASONE) 10 MG tablet Start 60 mg po day one, then 50 mg po day two, taper by 10 mg daily until complete.  . [DISCONTINUED] promethazine (PHENERGAN) 12.5 MG tablet Take 1 tablet (12.5 mg total) by mouth every 6 (six) hours as needed for nausea or vomiting.  . [DISCONTINUED] ranitidine (ZANTAC) 150 MG tablet Take 1 tablet (150 mg total) by mouth 2 (two) times daily.   No facility-administered encounter medications on file as of 07/12/2016.     Review of Systems  Constitutional: Negative for appetite change and unexpected weight change.  HENT: Negative for congestion and sinus pressure.   Eyes: Negative for pain and visual disturbance.  Respiratory: Negative for cough, chest tightness and shortness of breath.   Cardiovascular: Negative for chest pain, palpitations and leg swelling.  Gastrointestinal: Negative for abdominal pain, diarrhea, nausea and vomiting.  Genitourinary: Negative for difficulty urinating and dysuria.  Musculoskeletal: Negative for back pain and joint swelling.  Skin: Negative for color change and rash.  Neurological: Negative for dizziness, light-headedness and headaches.  Hematological: Negative for adenopathy. Does not bruise/bleed easily.  Psychiatric/Behavioral: Negative for agitation and dysphoric mood.       Objective:     Blood pressure rechecked by me:  118/78  Physical Exam  Constitutional: She is oriented to person, place, and time. She appears well-developed and well-nourished. No distress.  HENT:    Nose: Nose normal.  Mouth/Throat: Oropharynx is clear and moist.  Eyes: Right eye exhibits no discharge. Left eye exhibits no discharge. No scleral icterus.  Neck: Neck supple. No thyromegaly present.  Cardiovascular: Normal rate and regular rhythm.   Pulmonary/Chest: Breath sounds normal. No accessory muscle usage. No tachypnea. No respiratory distress. She has no decreased breath sounds. She has no wheezes. She has no rhonchi. Right breast exhibits no inverted nipple, no mass, no nipple discharge and no tenderness (no axillary adenopathy). Left breast exhibits no inverted nipple, no mass, no nipple discharge and no tenderness (no axilarry adenopathy).  Abdominal: Soft. Bowel sounds are normal. There is no tenderness.  Genitourinary:  Genitourinary Comments: Normal external genitalia.  Vaginal vault without lesions.  s/p hysterectomy.   Pap smear performed.  Could not appreciate any adnexal masses  or tenderness.    Musculoskeletal: She exhibits no edema or tenderness.  Lymphadenopathy:    She has no cervical adenopathy.  Neurological: She is alert and oriented to person, place, and time.  Skin: Skin is warm. No rash noted. No erythema.  Psychiatric: She has a normal mood and affect. Her behavior is normal.    BP 128/62 (BP Location: Left Arm, Patient Position: Sitting, Cuff Size: Large)   Pulse 92   Temp 98.8 F (37.1 C) (Oral)   Resp 12   Ht  (1.6 m)   Wt 217 lb (98.4 kg)   SpO2 97%   BMI 38.44 kg/m  Wt Readings from Last 3 Encounters:  07/12/16 217 lb (98.4 kg)  04/11/16 224 lb 9.6 oz (101.9 kg)  03/24/16 226 lb (102.5 kg)     Lab Results  Component Value Date   WBC 8.6 07/08/2016   HGB 12.5 07/08/2016   HCT 39.1 07/08/2016   PLT 213.0 07/08/2016   GLUCOSE 123 (H) 07/08/2016   CHOL 182 07/08/2016   TRIG 205.0 (H) 07/08/2016   HDL 57.00 07/08/2016   LDLDIRECT 102.0 07/08/2016   LDLCALC 71 04/12/2016   ALT 35 07/08/2016   AST 32 07/08/2016   NA 139 07/08/2016    K 4.6 07/08/2016   CL 101 07/08/2016   CREATININE 0.60 07/08/2016   BUN 13 07/08/2016   CO2 29 07/08/2016   TSH 2.71 04/12/2016   HGBA1C 7.6 (H) 07/08/2016   MICROALBUR 4.4 (H) 11/23/2015       Assessment & Plan:   Problem List Items Addressed This Visit    Anemia    hgb just checked 07/08/16 - wnl.  Saw GI recently.        CAD (coronary artery disease)    Asymptomatic.  Continue risk factor modification.        Diabetes mellitus (HCC)    Low carb diet and exercise.  Seeing Dr Tedd Sias now.  Sugars doing better on current regimen.  Follow.        Relevant Medications   dapagliflozin propanediol (FARXIGA) 10 MG TABS tablet   TRULICITY 1.5 MG/0.5ML SOPN   Other Relevant Orders   Hemoglobin A1c   Basic metabolic panel   Health care maintenance    Physical today 07/12/16.  Colonoscopy 05/04/14.  Mammogram 07/14/15 - Birads I.  She wants to schedule her own mammogram.        Hypercholesterolemia    On pravastatin.  Low cholesterol diet and exercise.  Follow lipid panel and liver function tests.        Relevant Orders   Hepatic function panel   Lipid panel   Hypertension    Blood pressure under good control.  Continue same medication regimen.  Follow pressures.  Follow metabolic panel.        Obesity (BMI 30-39.9)    Diet and exercise.  Follow.        Relevant Medications   dapagliflozin propanediol (FARXIGA) 10 MG TABS tablet   TRULICITY 1.5 MG/0.5ML SOPN   Sleep apnea    CPAP.         Other Visit Diagnoses    Routine general medical examination at a health care facility    -  Primary   Screening for cervical cancer       Relevant Orders   Cytology - PAP (Completed)       Dale Ferron, MD

## 2016-07-12 NOTE — Assessment & Plan Note (Signed)
Physical today 07/12/16.  Colonoscopy 05/04/14.  Mammogram 07/14/15 - Birads I.  She wants to schedule her own mammogram.

## 2016-07-13 ENCOUNTER — Other Ambulatory Visit (HOSPITAL_COMMUNITY)
Admission: RE | Admit: 2016-07-13 | Discharge: 2016-07-13 | Disposition: A | Discharge status: Home / Self Care | Payer: BC Managed Care – PPO | Source: Ambulatory Visit | Attending: Internal Medicine | Admitting: Internal Medicine

## 2016-07-13 DIAGNOSIS — Z124 Encounter for screening for malignant neoplasm of cervix: Secondary | ICD-10-CM | POA: Diagnosis not present

## 2016-07-14 ENCOUNTER — Other Ambulatory Visit: Payer: Self-pay | Admitting: Internal Medicine

## 2016-07-14 LAB — CYTOLOGY - PAP
DIAGNOSIS: NEGATIVE
HPV: NOT DETECTED

## 2016-07-17 ENCOUNTER — Encounter: Payer: Self-pay | Admitting: Internal Medicine

## 2016-07-17 NOTE — Assessment & Plan Note (Signed)
Asymptomatic.  Continue risk factor modification.    

## 2016-07-17 NOTE — Assessment & Plan Note (Signed)
Diet and exercise.  Follow.  

## 2016-07-17 NOTE — Assessment & Plan Note (Signed)
hgb just checked 07/08/16 - wnl.  Saw GI recently.

## 2016-07-17 NOTE — Assessment & Plan Note (Signed)
Low carb diet and exercise.  Seeing Dr Tedd SiasSolum now.  Sugars doing better on current regimen.  Follow.

## 2016-07-17 NOTE — Assessment & Plan Note (Signed)
CPAP.  

## 2016-07-17 NOTE — Assessment & Plan Note (Signed)
On pravastatin.  Low cholesterol diet and exercise.  Follow lipid panel and liver function tests.   

## 2016-07-17 NOTE — Assessment & Plan Note (Signed)
Blood pressure under good control.  Continue same medication regimen.  Follow pressures.  Follow metabolic panel.   

## 2016-07-18 ENCOUNTER — Encounter: Payer: Self-pay | Admitting: Internal Medicine

## 2016-07-18 MED ORDER — CYCLOBENZAPRINE HCL 10 MG PO TABS: 5.0000 mg | ORAL_TABLET | Freq: Every evening | ORAL | 0 refills | Status: DC | PRN

## 2016-07-18 NOTE — Telephone Encounter (Signed)
rx ok'd for flexeril #30 with no refills.

## 2016-07-19 ENCOUNTER — Encounter: Payer: Self-pay | Admitting: Internal Medicine

## 2016-07-28 ENCOUNTER — Other Ambulatory Visit: Payer: Self-pay | Admitting: Internal Medicine

## 2016-08-01 ENCOUNTER — Other Ambulatory Visit: Payer: Self-pay | Admitting: Internal Medicine

## 2016-08-01 DIAGNOSIS — Z1231 Encounter for screening mammogram for malignant neoplasm of breast: Secondary | ICD-10-CM

## 2016-08-04 ENCOUNTER — Other Ambulatory Visit: Payer: Self-pay | Admitting: Internal Medicine

## 2016-08-13 ENCOUNTER — Other Ambulatory Visit: Payer: Self-pay | Admitting: Internal Medicine

## 2016-08-17 ENCOUNTER — Ambulatory Visit
Admission: RE | Admit: 2016-08-17 | Discharge: 2016-08-17 | Disposition: A | Discharge status: Home / Self Care | Payer: BC Managed Care – PPO | Source: Ambulatory Visit | Attending: Internal Medicine | Admitting: Internal Medicine

## 2016-08-17 DIAGNOSIS — Z1231 Encounter for screening mammogram for malignant neoplasm of breast: Secondary | ICD-10-CM | POA: Diagnosis not present

## 2016-10-01 ENCOUNTER — Other Ambulatory Visit: Payer: Self-pay | Admitting: Internal Medicine

## 2016-10-17 ENCOUNTER — Other Ambulatory Visit: Payer: Self-pay | Admitting: Internal Medicine

## 2016-11-04 ENCOUNTER — Other Ambulatory Visit: Payer: Self-pay | Admitting: Internal Medicine

## 2016-11-21 ENCOUNTER — Other Ambulatory Visit (INDEPENDENT_AMBULATORY_CARE_PROVIDER_SITE_OTHER): Payer: BC Managed Care – PPO

## 2016-11-21 DIAGNOSIS — E7800 Pure hypercholesterolemia, unspecified: Secondary | ICD-10-CM

## 2016-11-21 DIAGNOSIS — E119 Type 2 diabetes mellitus without complications: Secondary | ICD-10-CM

## 2016-11-21 LAB — LIPID PANEL
CHOLESTEROL: 177 mg/dL (ref 0–200)
HDL: 59.8 mg/dL (ref >39.00)
LDL Cholesterol: 92 mg/dL (ref 0–99)
NonHDL: 117.66
TRIGLYCERIDES: 130 mg/dL (ref 0.0–149.0)
Total CHOL/HDL Ratio: 3
VLDL: 26 mg/dL (ref 0.0–40.0)

## 2016-11-21 LAB — HEPATIC FUNCTION PANEL
ALT: 18 U/L (ref 0–35)
AST: 16 U/L (ref 0–37)
Albumin: 3.9 g/dL (ref 3.5–5.2)
Alkaline Phosphatase: 56 U/L (ref 39–117)
BILIRUBIN DIRECT: 0 mg/dL (ref 0.0–0.3)
TOTAL PROTEIN: 6.5 g/dL (ref 6.0–8.3)
Total Bilirubin: 0.3 mg/dL (ref 0.2–1.2)

## 2016-11-21 LAB — BASIC METABOLIC PANEL
BUN: 13 mg/dL (ref 6–23)
CALCIUM: 9.5 mg/dL (ref 8.4–10.5)
CHLORIDE: 100 meq/L (ref 96–112)
CO2: 31 meq/L (ref 19–32)
Creatinine, Ser: 0.59 mg/dL (ref 0.40–1.20)
GFR: 111.38 mL/min (ref >60.00)
GLUCOSE: 129 mg/dL — AB (ref 70–99)
Potassium: 4.5 mEq/L (ref 3.5–5.1)
Sodium: 139 mEq/L (ref 135–145)

## 2016-11-21 LAB — HEMOGLOBIN A1C: Hgb A1c MFr Bld: 6.7 % — ABNORMAL HIGH (ref 4.6–6.5)

## 2016-11-22 ENCOUNTER — Encounter: Payer: Self-pay | Admitting: Internal Medicine

## 2016-11-23 ENCOUNTER — Ambulatory Visit (INDEPENDENT_AMBULATORY_CARE_PROVIDER_SITE_OTHER): Payer: BC Managed Care – PPO | Admitting: Internal Medicine

## 2016-11-23 ENCOUNTER — Encounter: Payer: Self-pay | Admitting: Internal Medicine

## 2016-11-23 VITALS — BP 130/66 | HR 68 | Temp 98.6°F | Resp 12 | Ht 63.0 in | Wt 220.4 lb

## 2016-11-23 DIAGNOSIS — D649 Anemia, unspecified: Secondary | ICD-10-CM | POA: Diagnosis not present

## 2016-11-23 DIAGNOSIS — I25118 Atherosclerotic heart disease of native coronary artery with other forms of angina pectoris: Secondary | ICD-10-CM | POA: Diagnosis not present

## 2016-11-23 DIAGNOSIS — G4452 New daily persistent headache (NDPH): Secondary | ICD-10-CM

## 2016-11-23 DIAGNOSIS — E119 Type 2 diabetes mellitus without complications: Secondary | ICD-10-CM | POA: Diagnosis not present

## 2016-11-23 DIAGNOSIS — E669 Obesity, unspecified: Secondary | ICD-10-CM | POA: Diagnosis not present

## 2016-11-23 DIAGNOSIS — E7800 Pure hypercholesterolemia, unspecified: Secondary | ICD-10-CM

## 2016-11-23 DIAGNOSIS — I10 Essential (primary) hypertension: Secondary | ICD-10-CM | POA: Diagnosis not present

## 2016-11-23 DIAGNOSIS — G4730 Sleep apnea, unspecified: Secondary | ICD-10-CM | POA: Diagnosis not present

## 2016-11-23 LAB — SEDIMENTATION RATE: Sed Rate: 17 mm/hr (ref 0–30)

## 2016-11-23 MED ORDER — CYCLOBENZAPRINE HCL 10 MG PO TABS: 5.0000 mg | ORAL_TABLET | Freq: Every evening | ORAL | 0 refills | Status: DC | PRN

## 2016-11-23 MED ORDER — AMOXICILLIN 875 MG PO TABS: 875.0000 mg | ORAL_TABLET | Freq: Two times a day (BID) | ORAL | 0 refills | Status: DC

## 2016-11-23 NOTE — Progress Notes (Signed)
Patient ID: Evelyn James, female   DOB: Oct 14, 1960, 56 y.o.   MRN: [id number]   Subjective:    Patient ID: Evelyn James, female    DOB: 1960-07-14, 56 y.o.   MRN: [id number]  HPI  Patient here for a scheduled follow up.  Has diabetes.  Seeing endocrinology.  On metformin, farxiga and trulicity.  Sugars improved.  States am sugars averaging 120s.  Does not check pm sugars regularly.  When she does check - averages <150.  Going to the pool for her exercise.  No chest pain.  No sob.  No acid reflux.  No abdominal pain.  Bowels moving.  She does report persistent headache.  She relates as a sinus headache.  Pain on top of her head and pain into her teeth.  Some nasal congestion.  Feels similar to her previous sinus infections.  Using flonase, saline nasal spray and zyrtec.  Also some pain, right TMJ.  Hurts with opening and closing her mouth.  No fever.  Handling stress.     Past Medical History:  Diagnosis Date  . Anemia    Noted 11/2011  . Anxiety   . Chest pain    Admitted 11/2011: cath with mild nonobstructive coronary plaque, normal EF, negative cardiac enzymes and negative d-dimer.  . Diabetes mellitus   . Fatty liver disease, nonalcoholic 8676  . GERD (gastroesophageal reflux disease)   . Hypercholesterolemia   . Hypertension   . Reflux   . TMJ dysfunction    Past Surgical History:  Procedure Laterality Date  . ABDOMINAL HYSTERECTOMY    . BILATERAL SALPINGOOPHORECTOMY     benign ovarian tumor  . IMAGE GUIDED SINUS SURGERY N/A 10/22/2015   Procedure: IMAGE GUIDED SINUS SURGERY;  Surgeon: Carloyn Manner, MD;  Location: [facility] ORS;  Service: ENT;  Laterality: N/A;  . KNEE SURGERY    . LEFT HEART CATHETERIZATION WITH CORONARY ANGIOGRAM N/A 12/09/2011   Procedure: LEFT HEART CATHETERIZATION WITH CORONARY ANGIOGRAM;  Surgeon: Minus Breeding, MD;  Location: [facility] CATH LAB;  Service: Cardiovascular;  Laterality: N/A;  . SPHENOIDECTOMY Left 10/22/2015   Procedure: SPHENOIDECTOMY;  Surgeon:  Carloyn Manner, MD;  Location: [facility] ORS;  Service: ENT;  Laterality: Left;  . SUBMANDIBULAR MASS EXCISION     Family History  Problem Relation Age of Onset  . Heart disease Father        Father had CABG in his 80s  . Colon polyps Father   . Lung cancer Mother   . Breast cancer Paternal Grandmother   . Arthritis/Rheumatoid Paternal Grandfather    Social History   Social History  . Marital status: Divorced    Spouse name: N/A  . Number of children: 2  . Years of education: N/A   Social History Main Topics  . Smoking status: Never Smoker  . Smokeless tobacco: Never Used  . Alcohol use No  . Drug use: No  . Sexual activity: Not Asked   Other Topics Concern  . None   Social History Narrative  . None    Outpatient Encounter Prescriptions as of 11/23/2016  Medication Sig  . acetaminophen (TYLENOL) 500 MG tablet Take 1,000 mg by mouth every 6 (six) hours as needed for mild pain.  . cetirizine (ZYRTEC) 10 MG tablet Take 10 mg by mouth daily.  . CONTOUR NEXT TEST test strip TEST once daily  . cyanocobalamin (,VITAMIN B-12,) 1000 MCG/ML injection inject 1 milliliter intramuscularly every month  . cyclobenzaprine (FLEXERIL) 10 MG tablet Take 0.5-1 tablets (  5-10 mg total) by mouth at bedtime as needed for muscle spasms.  . dapagliflozin propanediol (FARXIGA) 10 MG TABS tablet Take 10 mg by mouth daily.  Marland Kitchen escitalopram (LEXAPRO) 20 MG tablet take 1 tablet by mouth once daily  . esomeprazole (NEXIUM) 20 MG capsule Take 20 mg by mouth daily at 12 noon.  . Fe Fum-FePoly-Vit C-Vit B3 (INTEGRA) 62.5-62.5-40-3 MG CAPS take 1 capsule by mouth twice a day  . fluticasone (FLONASE) 50 MCG/ACT nasal spray Place 2 sprays into both nostrils daily.  Marland Kitchen lisinopril (PRINIVIL,ZESTRIL) 20 MG tablet take 1 tablet by mouth once daily  . Melatonin 10 MG TABS Take 10 mg by mouth at bedtime as needed (sleep).  . metFORMIN (GLUCOPHAGE) 1000 MG tablet take 1 tablet by mouth twice a day with meals  .  mupirocin ointment (BACTROBAN) 2 % Place 1 application into the nose 2 (two) times daily.  . pravastatin (PRAVACHOL) 40 MG tablet take 1 tablet by mouth once daily  . PREMARIN 0.45 MG tablet take 1 tablet by mouth once daily  . Probiotic Product (PHILLIPS COLON HEALTH PO) Take 1 tablet by mouth daily as needed (colon health).   . TRULICITY 1.5 GE/3.6OQ SOPN inject 1.5 milligrams subcutaneously every 7 days  . [DISCONTINUED] cyclobenzaprine (FLEXERIL) 10 MG tablet Take 0.5-1 tablets (5-10 mg total) by mouth at bedtime as needed for muscle spasms.  Marland Kitchen amoxicillin (AMOXIL) 875 MG tablet Take 1 tablet (875 mg total) by mouth 2 (two) times daily.  . [DISCONTINUED] diphenhydrAMINE (SOMINEX) 25 MG tablet Take 25 mg by mouth at bedtime as needed for sleep.  . [DISCONTINUED] escitalopram (LEXAPRO) 20 MG tablet take 1 tablet by mouth once daily  . [DISCONTINUED] HYDROcodone-acetaminophen (NORCO) 5-325 MG tablet Take 1 tablet by mouth every 4 (four) hours as needed for moderate pain.   No facility-administered encounter medications on file as of 11/23/2016.     Review of Systems  Constitutional: Negative for appetite change and unexpected weight change.  HENT: Positive for congestion, postnasal drip and sinus pressure.   Respiratory: Negative for cough, chest tightness and shortness of breath.   Cardiovascular: Negative for chest pain, palpitations and leg swelling.  Gastrointestinal: Negative for abdominal pain, diarrhea, nausea and vomiting.  Genitourinary: Negative for difficulty urinating and dysuria.  Musculoskeletal: Negative for joint swelling and myalgias.  Skin: Negative for color change and rash.  Neurological: Positive for headaches. Negative for dizziness.  Psychiatric/Behavioral: Negative for agitation and dysphoric mood.       Objective:    Physical Exam  Constitutional: She appears well-developed and well-nourished. No distress.  HENT:  Mouth/Throat: Oropharynx is clear and moist.    Nares - slightly erythematous turbinates.  TMs clear.  No tenderness to palpation over the temples.    Eyes: Conjunctivae are normal. Right eye exhibits no discharge. Left eye exhibits no discharge.  Neck: Neck supple. No thyromegaly present.  Cardiovascular: Normal rate and regular rhythm.   Pulmonary/Chest: Breath sounds normal. No respiratory distress. She has no wheezes.  Abdominal: Soft. Bowel sounds are normal. There is no tenderness.  Musculoskeletal: She exhibits no edema or tenderness.  Lymphadenopathy:    She has no cervical adenopathy.  Skin: No rash noted. No erythema.  Psychiatric: She has a normal mood and affect. Her behavior is normal.    BP 130/66 (BP Location: Left Arm, Patient Position: Sitting, Cuff Size: Normal)   Pulse 68   Temp 98.6 F (37 C) (Oral)   Resp 12   Ht 5'  3" (1.6 m)   Wt 220 lb 6.4 oz (100 kg)   SpO2 97%   BMI 39.04 kg/m  Wt Readings from Last 3 Encounters:  11/23/16 220 lb 6.4 oz (100 kg)  07/12/16 217 lb (98.4 kg)  04/11/16 224 lb 9.6 oz (101.9 kg)     Lab Results  Component Value Date   WBC 8.6 07/08/2016   HGB 12.5 07/08/2016   HCT 39.1 07/08/2016   PLT 213.0 07/08/2016   GLUCOSE 129 (H) 11/21/2016   CHOL 177 11/21/2016   TRIG 130.0 11/21/2016   HDL 59.80 11/21/2016   LDLDIRECT 102.0 07/08/2016   LDLCALC 92 11/21/2016   ALT 18 11/21/2016   AST 16 11/21/2016   NA 139 11/21/2016   K 4.5 11/21/2016   CL 100 11/21/2016   CREATININE 0.59 11/21/2016   BUN 13 11/21/2016   CO2 31 11/21/2016   TSH 2.71 04/12/2016   HGBA1C 6.7 (H) 11/21/2016   MICROALBUR 4.4 (H) 11/23/2015    Mm Digital Screening Bilateral  Result Date: 08/18/2016 CLINICAL DATA:  Screening. EXAM: DIGITAL SCREENING BILATERAL MAMMOGRAM WITH CAD COMPARISON:  Previous exam(s). ACR Breast Density Category a: The breast tissue is almost entirely fatty. FINDINGS: There are no findings suspicious for malignancy. Images were processed with CAD. IMPRESSION: No mammographic  evidence of malignancy. A result letter of this screening mammogram will be mailed directly to the patient. RECOMMENDATION: Screening mammogram in one year. (Code:SM-B-01Y) BI-RADS CATEGORY  1: Negative. Electronically Signed   By: Everlean Alstrom M.D.   On: 08/18/2016 08:00       Assessment & Plan:   Problem List Items Addressed This Visit    Anemia    hgb in 06/2016 wnl.  Follow.        Relevant Orders   CBC with Differential/Platelet   Ferritin   CAD (coronary artery disease)    Continue risk factor modification.        Diabetes mellitus ([place])    Sugars doing better.  Seeing endocrinology.  Diet and exercise.  Follow met b and a1c.        Relevant Orders   Hemoglobin A1c   Microalbumin / creatinine urine ratio   Headache - Primary    S/p sinus surgery.  Now with increased sinus pressure and headache that feels c/w her previous sinus infections.  Will treat for sinus infection with amoxicillin as outlined.  Continue flonase and saline nasal spray as outlined.  Follow.  Notify me if headache persist.  Check esr.        Relevant Medications   cyclobenzaprine (FLEXERIL) 10 MG tablet   Other Relevant Orders   Sedimentation rate (Completed)   Hypercholesterolemia    On pravastatin.  Low cholesterol diet and exercise.  Follow lipid panel and liver function tests.        Relevant Orders   Hepatic function panel   Lipid panel   Hypertension    Blood pressure under good control.  Continue same medication regimen.  Follow pressures.  Follow metabolic panel.        Relevant Orders   TSH   Basic metabolic panel   Obesity (BMI 30-39.9)    Diet and exercise.  Follow.        Sleep apnea    CPAP.           Einar Pheasant, MD

## 2016-11-24 ENCOUNTER — Encounter: Payer: Self-pay | Admitting: Internal Medicine

## 2016-11-25 ENCOUNTER — Encounter: Payer: Self-pay | Admitting: Internal Medicine

## 2016-11-25 NOTE — Assessment & Plan Note (Signed)
Continue risk factor modification 

## 2016-11-25 NOTE — Assessment & Plan Note (Signed)
Diet and exercise.  Follow.  

## 2016-11-25 NOTE — Assessment & Plan Note (Signed)
hgb in 06/2016 wnl.  Follow.

## 2016-11-25 NOTE — Assessment & Plan Note (Signed)
CPAP.  

## 2016-11-25 NOTE — Assessment & Plan Note (Signed)
S/p sinus surgery.  Now with increased sinus pressure and headache that feels c/w her previous sinus infections.  Will treat for sinus infection with amoxicillin as outlined.  Continue flonase and saline nasal spray as outlined.  Follow.  Notify me if headache persist.  Check esr.

## 2016-11-25 NOTE — Assessment & Plan Note (Signed)
Sugars doing better.  Seeing endocrinology.  Diet and exercise.  Follow met b and a1c.

## 2016-11-25 NOTE — Assessment & Plan Note (Signed)
Blood pressure under good control.  Continue same medication regimen.  Follow pressures.  Follow metabolic panel.   

## 2016-11-25 NOTE — Assessment & Plan Note (Signed)
On pravastatin.  Low cholesterol diet and exercise.  Follow lipid panel and liver function tests.   

## 2016-12-02 ENCOUNTER — Other Ambulatory Visit: Payer: Self-pay | Admitting: Internal Medicine

## 2016-12-03 ENCOUNTER — Other Ambulatory Visit: Payer: Self-pay | Admitting: Internal Medicine

## 2017-01-02 ENCOUNTER — Other Ambulatory Visit: Payer: Self-pay | Admitting: Internal Medicine

## 2017-01-09 ENCOUNTER — Other Ambulatory Visit: Payer: Self-pay | Admitting: Internal Medicine

## 2017-01-31 ENCOUNTER — Other Ambulatory Visit: Payer: Self-pay | Admitting: Internal Medicine

## 2017-02-08 ENCOUNTER — Other Ambulatory Visit: Payer: Self-pay | Admitting: Internal Medicine

## 2017-03-01 ENCOUNTER — Other Ambulatory Visit: Payer: Self-pay | Admitting: Internal Medicine

## 2017-03-23 ENCOUNTER — Other Ambulatory Visit (INDEPENDENT_AMBULATORY_CARE_PROVIDER_SITE_OTHER): Payer: BC Managed Care – PPO

## 2017-03-23 DIAGNOSIS — E7800 Pure hypercholesterolemia, unspecified: Secondary | ICD-10-CM

## 2017-03-23 DIAGNOSIS — I10 Essential (primary) hypertension: Secondary | ICD-10-CM

## 2017-03-23 DIAGNOSIS — E119 Type 2 diabetes mellitus without complications: Secondary | ICD-10-CM

## 2017-03-23 DIAGNOSIS — D649 Anemia, unspecified: Secondary | ICD-10-CM | POA: Diagnosis not present

## 2017-03-23 LAB — LIPID PANEL
CHOL/HDL RATIO: 3
Cholesterol: 189 mg/dL (ref 0–200)
HDL: 58.9 mg/dL (ref >39.00)
NONHDL: 130.04
Triglycerides: 241 mg/dL — ABNORMAL HIGH (ref 0.0–149.0)
VLDL: 48.2 mg/dL — AB (ref 0.0–40.0)

## 2017-03-23 LAB — CBC WITH DIFFERENTIAL/PLATELET
Basophils Absolute: 0.1 10*3/uL (ref 0.0–0.1)
Basophils Relative: 0.6 % (ref 0.0–3.0)
EOS PCT: 2.2 % (ref 0.0–5.0)
Eosinophils Absolute: 0.2 10*3/uL (ref 0.0–0.7)
HCT: 40.1 % (ref 36.0–46.0)
HEMOGLOBIN: 12.9 g/dL (ref 12.0–15.0)
Lymphocytes Relative: 34.2 % (ref 12.0–46.0)
Lymphs Abs: 3.1 10*3/uL (ref 0.7–4.0)
MCHC: 32.2 g/dL (ref 30.0–36.0)
MCV: 87.9 fl (ref 78.0–100.0)
MONOS PCT: 6.1 % (ref 3.0–12.0)
Monocytes Absolute: 0.5 10*3/uL (ref 0.1–1.0)
Neutro Abs: 5.1 10*3/uL (ref 1.4–7.7)
Neutrophils Relative %: 56.9 % (ref 43.0–77.0)
Platelets: 223 10*3/uL (ref 150.0–400.0)
RBC: 4.57 Mil/uL (ref 3.87–5.11)
RDW: 13.7 % (ref 11.5–15.5)
WBC: 9 10*3/uL (ref 4.0–10.5)

## 2017-03-23 LAB — BASIC METABOLIC PANEL
BUN: 8 mg/dL (ref 6–23)
CO2: 27 mEq/L (ref 19–32)
Calcium: 8.7 mg/dL (ref 8.4–10.5)
Chloride: 100 mEq/L (ref 96–112)
Creatinine, Ser: 0.54 mg/dL (ref 0.40–1.20)
GFR: 123.21 mL/min (ref >60.00)
Glucose, Bld: 121 mg/dL — ABNORMAL HIGH (ref 70–99)
POTASSIUM: 3.8 meq/L (ref 3.5–5.1)
Sodium: 138 mEq/L (ref 135–145)

## 2017-03-23 LAB — MICROALBUMIN / CREATININE URINE RATIO
Creatinine,U: 137.8 mg/dL
MICROALB UR: 2.1 mg/dL — AB (ref 0.0–1.9)
Microalb Creat Ratio: 1.5 mg/g (ref 0.0–30.0)

## 2017-03-23 LAB — HEPATIC FUNCTION PANEL
ALT: 16 U/L (ref 0–35)
AST: 15 U/L (ref 0–37)
Albumin: 4 g/dL (ref 3.5–5.2)
Alkaline Phosphatase: 62 U/L (ref 39–117)
BILIRUBIN TOTAL: 0.3 mg/dL (ref 0.2–1.2)
Bilirubin, Direct: 0 mg/dL (ref 0.0–0.3)
Total Protein: 7 g/dL (ref 6.0–8.3)

## 2017-03-23 LAB — TSH: TSH: 3.41 u[IU]/mL (ref 0.35–4.50)

## 2017-03-23 LAB — LDL CHOLESTEROL, DIRECT: Direct LDL: 109 mg/dL

## 2017-03-23 LAB — HEMOGLOBIN A1C: HEMOGLOBIN A1C: 6.8 % — AB (ref 4.6–6.5)

## 2017-03-23 LAB — FERRITIN: FERRITIN: 34.3 ng/mL (ref 10.0–291.0)

## 2017-03-24 ENCOUNTER — Encounter: Payer: Self-pay | Admitting: Internal Medicine

## 2017-03-27 ENCOUNTER — Ambulatory Visit: Payer: BC Managed Care – PPO | Admitting: Internal Medicine

## 2017-04-06 ENCOUNTER — Other Ambulatory Visit: Payer: Self-pay | Admitting: Internal Medicine

## 2017-04-06 ENCOUNTER — Ambulatory Visit: Payer: BC Managed Care – PPO | Admitting: Family Medicine

## 2017-04-06 ENCOUNTER — Encounter: Payer: Self-pay | Admitting: Family Medicine

## 2017-04-06 VITALS — BP 118/74 | HR 83 | Temp 97.8°F | Resp 18 | Ht 64.0 in | Wt 221.1 lb

## 2017-04-06 DIAGNOSIS — J0140 Acute pansinusitis, unspecified: Secondary | ICD-10-CM

## 2017-04-06 MED ORDER — AMOXICILLIN-POT CLAVULANATE 875-125 MG PO TABS: 1.0000 | ORAL_TABLET | Freq: Two times a day (BID) | ORAL | 0 refills | Status: DC

## 2017-04-06 NOTE — Progress Notes (Signed)
Pre-visit discussion using our clinic review tool. No additional management support is needed unless otherwise documented below in the visit note.  

## 2017-04-06 NOTE — Progress Notes (Signed)
Patient ID: Evelyn James, female   DOB: October 14, 1960, 57 y.o.   MRN: [id number]  PCP: Dale Spring, MD  Subjective:  Evelyn James is a 57 y.o. year old very pleasant female patient who presents with symptoms including nasal congestion, sinus pressure/pain, post nasal drip -started: 4 weeks, symptoms are not improving -previous treatments: Flonase did not provide benefit. Afrin use for 3 days has provided benefit. -sick contacts/travel/risks: denies flu exposure. Influenza vaccine is UTD  -Hx of: mild seasonal allergies No recent antibiotic use, she is not a smoker  ROS-denies fever, SOB, NVD, tooth pain, ear pain  Pertinent Past Medical History- HTN, DM, CAD  Medications- reviewed  Current Outpatient Medications  Medication Sig Dispense Refill  . acetaminophen (TYLENOL) 500 MG tablet Take 1,000 mg by mouth every 6 (six) hours as needed for mild pain.    . cetirizine (ZYRTEC) 10 MG tablet Take 10 mg by mouth daily.    . CONTOUR NEXT TEST test strip TEST once daily 100 each 9  . cyanocobalamin (,VITAMIN B-12,) 1000 MCG/ML injection inject 1 milliliter intramuscularly every month 10 mL 1  . cyclobenzaprine (FLEXERIL) 10 MG tablet Take 0.5-1 tablets (5-10 mg total) by mouth at bedtime as needed for muscle spasms. 30 tablet 0  . dapagliflozin propanediol (FARXIGA) 10 MG TABS tablet Take 10 mg by mouth daily.    Marland Kitchen escitalopram (LEXAPRO) 20 MG tablet take 1 tablet by mouth once daily 30 tablet 0  . esomeprazole (NEXIUM) 20 MG capsule Take 20 mg by mouth daily at 12 noon.    . Fe Fum-FePoly-Vit C-Vit B3 (INTEGRA) 62.5-62.5-40-3 MG CAPS take 1 capsule by mouth twice a day 60 capsule 3  . fluticasone (FLONASE) 50 MCG/ACT nasal spray Place 2 sprays into both nostrils daily. 16 g 5  . lisinopril (PRINIVIL,ZESTRIL) 20 MG tablet take 1 tablet by mouth once daily 90 tablet 2  . Melatonin 10 MG TABS Take 10 mg by mouth at bedtime as needed (sleep).    . metFORMIN (GLUCOPHAGE) 1000 MG tablet take 1  tablet by mouth twice a day with meals 180 tablet 0  . mupirocin ointment (BACTROBAN) 2 % Place 1 application into the nose 2 (two) times daily. 22 g 2  . pravastatin (PRAVACHOL) 40 MG tablet take 1 tablet by mouth once daily 90 tablet 0  . PREMARIN 0.45 MG tablet take 1 tablet by mouth once daily 30 tablet 2  . Probiotic Product (PHILLIPS COLON HEALTH PO) Take 1 tablet by mouth daily as needed (colon health).     . TRULICITY 1.5 MG/0.5ML SOPN inject 1.5 milligrams subcutaneously every 7 days  0  . amoxicillin (AMOXIL) 875 MG tablet Take 1 tablet (875 mg total) by mouth 2 (two) times daily. (Patient not taking: Reported on 04/06/2017) 20 tablet 0   No current facility-administered medications for this visit.     Objective: BP 118/74 (BP Location: Left Arm, Patient Position: Sitting, Cuff Size: Large)   Pulse 83   Temp 97.8 F (36.6 C) (Oral)   Resp 18   Ht 5\' 4"  (1.626 m)   Wt 221 lb 2 oz (100.3 kg)   SpO2 97%   BMI 37.96 kg/m  Gen: NAD, resting comfortably HEENT: Turbinates erythematous, TM normal, pharynx mildly erythematous with no tonsilar exudate or edema, + maxillary/frontal  sinus tenderness CV: RRR no murmurs rubs or gallops Lungs: CTAB no crackles, wheeze, rhonchi Abdomen: soft/nontender/nondistended/normal bowel sounds. No rebound or guarding.  Ext: no edema Skin: warm, dry,  no rash Neuro: grossly normal, moves all extremities  Assessment/Plan: 1. Acute pansinusitis, recurrence not specified Exam and history support empiric treatment for bacterial respiratory infection. Advised patient on supportive measures:  Get rest, drink plenty of fluids, and use tylenol as needed for pain. Follow up if fever >100, if symptoms worsen or if symptoms are not improved in 3-4 days. Encouraged use of saline nasal rinses and also advised patient regarding use of Afrin for > 3 days can cause rebound congestion. Patient  verbalizes understanding and agrees with plan.  Finally, we reviewed  reasons to return to care including if symptoms worsen or persist or new concerns arise- once again particularly shortness of breath or fever.    Inez CatalinaJulia Ann Patrici Minnis, FNP

## 2017-04-06 NOTE — Patient Instructions (Signed)
Please take medication as directed and follow up if symptoms do not improve with treatment, worsen, or you develop a fever >100.  Nasal saline rinses are also helpful  Feel better soon!   Sinusitis, Adult Sinusitis is soreness and inflammation of your sinuses. Sinuses are hollow spaces in the bones around your face. They are located:  Around your eyes.  In the middle of your forehead.  Behind your nose.  In your cheekbones.  Your sinuses and nasal passages are lined with a stringy fluid (mucus). Mucus normally drains out of your sinuses. When your nasal tissues get inflamed or swollen, the mucus can get trapped or blocked so air cannot flow through your sinuses. This lets bacteria, viruses, and funguses grow, and that leads to infection. Follow these instructions at home: Medicines  Take, use, or apply over-the-counter and prescription medicines only as told by your doctor. These may include nasal sprays.  If you were prescribed an antibiotic medicine, take it as told by your doctor. Do not stop taking the antibiotic even if you start to feel better. Hydrate and Humidify  Drink enough water to keep your pee (urine) clear or pale yellow.  Use a cool mist humidifier to keep the humidity level in your home above 50%.  Breathe in steam for 10-15 minutes, 3-4 times a day or as told by your doctor. You can do this in the bathroom while a hot shower is running.  Try not to spend time in cool or dry air. Rest  Rest as much as possible.  Sleep with your head raised (elevated).  Make sure to get enough sleep each night. General instructions  Put a warm, moist washcloth on your face 3-4 times a day or as told by your doctor. This will help with discomfort.  Wash your hands often with soap and water. If there is no soap and water, use hand sanitizer.  Do not smoke. Avoid being around people who are smoking (secondhand smoke).  Keep all follow-up visits as told by your doctor.  This is important. Contact a doctor if:  You have a fever.  Your symptoms get worse.  Your symptoms do not get better within 10 days. Get help right away if:  You have a very bad headache.  You cannot stop throwing up (vomiting).  You have pain or swelling around your face or eyes.  You have trouble seeing.  You feel confused.  Your neck is stiff.  You have trouble breathing. This information is not intended to replace advice given to you by your health care provider. Make sure you discuss any questions you have with your health care provider. Document Released: 08/17/2007 Document Revised: 10/25/2015 Document Reviewed: 12/24/2014 Elsevier Interactive Patient Education  [employer]2018 [employer]

## 2017-04-07 ENCOUNTER — Encounter: Payer: Self-pay | Admitting: Internal Medicine

## 2017-04-07 MED ORDER — GLUCOSE BLOOD VI STRP: ORAL_STRIP | 12 refills | Status: DC

## 2017-04-07 NOTE — Telephone Encounter (Signed)
yes

## 2017-04-11 ENCOUNTER — Other Ambulatory Visit: Payer: Self-pay | Admitting: Internal Medicine

## 2017-04-12 ENCOUNTER — Encounter: Payer: Self-pay | Admitting: Family Medicine

## 2017-04-13 MED ORDER — SCOPOLAMINE 1 MG/3DAYS TD PT72: 1.0000 | MEDICATED_PATCH | TRANSDERMAL | 0 refills | Status: DC

## 2017-04-13 MED ORDER — FLUCONAZOLE 150 MG PO TABS: 150.0000 mg | ORAL_TABLET | Freq: Once | ORAL | 0 refills | Status: AC

## 2017-05-05 ENCOUNTER — Other Ambulatory Visit: Payer: Self-pay | Admitting: Internal Medicine

## 2017-05-14 ENCOUNTER — Other Ambulatory Visit: Payer: Self-pay | Admitting: Internal Medicine

## 2017-06-03 ENCOUNTER — Other Ambulatory Visit: Payer: Self-pay | Admitting: Internal Medicine

## 2017-06-05 ENCOUNTER — Other Ambulatory Visit: Payer: Self-pay | Admitting: Internal Medicine

## 2017-06-07 ENCOUNTER — Other Ambulatory Visit: Payer: Self-pay | Admitting: Internal Medicine

## 2017-06-12 ENCOUNTER — Ambulatory Visit: Payer: BC Managed Care – PPO | Admitting: Internal Medicine

## 2017-06-12 ENCOUNTER — Encounter: Payer: Self-pay | Admitting: Internal Medicine

## 2017-06-12 DIAGNOSIS — E7800 Pure hypercholesterolemia, unspecified: Secondary | ICD-10-CM

## 2017-06-12 DIAGNOSIS — E669 Obesity, unspecified: Secondary | ICD-10-CM

## 2017-06-12 DIAGNOSIS — I25118 Atherosclerotic heart disease of native coronary artery with other forms of angina pectoris: Secondary | ICD-10-CM | POA: Diagnosis not present

## 2017-06-12 DIAGNOSIS — M25562 Pain in left knee: Secondary | ICD-10-CM

## 2017-06-12 DIAGNOSIS — G4730 Sleep apnea, unspecified: Secondary | ICD-10-CM

## 2017-06-12 DIAGNOSIS — D649 Anemia, unspecified: Secondary | ICD-10-CM

## 2017-06-12 DIAGNOSIS — I10 Essential (primary) hypertension: Secondary | ICD-10-CM | POA: Diagnosis not present

## 2017-06-12 DIAGNOSIS — M25561 Pain in right knee: Secondary | ICD-10-CM

## 2017-06-12 DIAGNOSIS — E119 Type 2 diabetes mellitus without complications: Secondary | ICD-10-CM | POA: Diagnosis not present

## 2017-06-12 MED ORDER — CYCLOBENZAPRINE HCL 10 MG PO TABS: 5.0000 mg | ORAL_TABLET | Freq: Every evening | ORAL | 0 refills | Status: DC | PRN

## 2017-06-12 NOTE — Progress Notes (Signed)
Patient ID: Evelyn James, female   DOB: [birth year], 57 y.o.   MRN: [id number]   Subjective:    Patient ID: Evelyn James, female    DOB: 1960-07-18, 57 y.o.   MRN: [id number]  HPI  Patient here for her scheduled follow up.  She reports she is doing relatively well.  Trying to watch her diet.  Fasting sugars averaging 125.  PM sugars averaging 170.  Discussed exercise.  No chest pain.  No sob.  No acid reflux.  No abdominal pain.  Bowels moving.  Bilateral knee pain - right > left.  Using biofreeze.  Taking alleve and tylenol.  Has been walking a lot.  Request ortho referral (wants to see Reche Dixon) for further evaluation.     Past Medical History:  Diagnosis Date  . Anemia    Noted 11/2011  . Anxiety   . Chest pain    Admitted 11/2011: cath with mild nonobstructive coronary plaque, normal EF, negative cardiac enzymes and negative d-dimer.  . Diabetes mellitus   . Fatty liver disease, nonalcoholic 8916  . GERD (gastroesophageal reflux disease)   . Hypercholesterolemia   . Hypertension   . Reflux   . TMJ dysfunction    Past Surgical History:  Procedure Laterality Date  . ABDOMINAL HYSTERECTOMY    . BILATERAL SALPINGOOPHORECTOMY     benign ovarian tumor  . IMAGE GUIDED SINUS SURGERY N/A 10/22/2015   Procedure: IMAGE GUIDED SINUS SURGERY;  Surgeon: Carloyn Manner, MD;  Location: [facility] ORS;  Service: ENT;  Laterality: N/A;  . KNEE SURGERY    . LEFT HEART CATHETERIZATION WITH CORONARY ANGIOGRAM N/A 12/09/2011   Procedure: LEFT HEART CATHETERIZATION WITH CORONARY ANGIOGRAM;  Surgeon: Minus Breeding, MD;  Location: [facility] CATH LAB;  Service: Cardiovascular;  Laterality: N/A;  . SPHENOIDECTOMY Left 10/22/2015   Procedure: SPHENOIDECTOMY;  Surgeon: Carloyn Manner, MD;  Location: [facility] ORS;  Service: ENT;  Laterality: Left;  . SUBMANDIBULAR MASS EXCISION     Family History  Problem Relation Age of Onset  . Heart disease Father        Father had CABG in his 74s  . Colon polyps Father   .  Lung cancer Mother   . Breast cancer Paternal Grandmother   . Arthritis/Rheumatoid Paternal Grandfather    Social History   Socioeconomic History  . Marital status: Divorced    Spouse name: Not on file  . Number of children: 2  . Years of education: Not on file  . Highest education level: Not on file  Occupational History  . Not on file  Social Needs  . Financial resource strain: Not on file  . Food insecurity:    Worry: Not on file    Inability: Not on file  . Transportation needs:    Medical: Not on file    Non-medical: Not on file  Tobacco Use  . Smoking status: Never Smoker  . Smokeless tobacco: Never Used  Substance and Sexual Activity  . Alcohol use: No    Alcohol/week: 0.0 oz  . Drug use: No  . Sexual activity: Not on file  Lifestyle  . Physical activity:    Days per week: Not on file    Minutes per session: Not on file  . Stress: Not on file  Relationships  . Social connections:    Talks on phone: Not on file    Gets together: Not on file    Attends religious service: Not on file    Active member of  club or organization: Not on file    Attends meetings of clubs or organizations: Not on file    Relationship status: Not on file  Other Topics Concern  . Not on file  Social History Narrative  . Not on file    Outpatient Encounter Medications as of 06/12/2017  Medication Sig  . acetaminophen (TYLENOL) 500 MG tablet Take 1,000 mg by mouth every 6 (six) hours as needed for mild pain.  Marland Kitchen amoxicillin (AMOXIL) 875 MG tablet Take 1 tablet (875 mg total) by mouth 2 (two) times daily. (Patient not taking: Reported on 04/06/2017)  . cetirizine (ZYRTEC) 10 MG tablet Take 10 mg by mouth daily.  . CONTOUR NEXT TEST test strip TEST once daily  . cyanocobalamin (,VITAMIN B-12,) 1000 MCG/ML injection inject 1 milliliter intramuscularly every month  . cyclobenzaprine (FLEXERIL) 10 MG tablet Take 0.5-1 tablets (5-10 mg total) by mouth at bedtime as needed for muscle spasms.  Marland Kitchen  escitalopram (LEXAPRO) 20 MG tablet TAKE 1 TABLET BY MOUTH EVERY DAY  . esomeprazole (NEXIUM) 20 MG capsule Take 20 mg by mouth daily at 12 noon.  Marland Kitchen FARXIGA 10 MG TABS tablet TAKE ONE TABLET BY MOUTH EVERY MORNING  . Fe Fum-FePoly-Vit C-Vit B3 (INTEGRA) 62.5-62.5-40-3 MG CAPS TAKE 1 CAPSULE BY MOUTH TWICE DAILY  . fluticasone (FLONASE) 50 MCG/ACT nasal spray Place 2 sprays into both nostrils daily.  Marland Kitchen glucose blood test strip three times before mills.  Marland Kitchen lisinopril (PRINIVIL,ZESTRIL) 20 MG tablet TAKE 1 TABLET BY MOUTH ONCE DAILY  . Melatonin 10 MG TABS Take 10 mg by mouth at bedtime as needed (sleep).  . metFORMIN (GLUCOPHAGE) 1000 MG tablet TAKE ONE TABLET BY MOUTH TWICE A DAY WITH MEALS  . mupirocin ointment (BACTROBAN) 2 % Place 1 application into the nose 2 (two) times daily.  . pravastatin (PRAVACHOL) 40 MG tablet take 1 tablet by mouth once daily  . PREMARIN 0.45 MG tablet TAKE 1 TABLET BY MOUTH ONCE DAILY  . Probiotic Product (PHILLIPS COLON HEALTH PO) Take 1 tablet by mouth daily as needed (colon health).   Marland Kitchen scopolamine (TRANSDERM-SCOP, 1.5 MG,) 1 MG/3DAYS Place 1 patch (1.5 mg total) onto the skin every 3 (three) days.  . TRULICITY 1.5 FE/7.6DY SOPN inject 1.5 milligrams subcutaneously every 7 days  . [DISCONTINUED] amoxicillin-clavulanate (AUGMENTIN) 875-125 MG tablet Take 1 tablet by mouth 2 (two) times daily.  . [DISCONTINUED] cyclobenzaprine (FLEXERIL) 10 MG tablet Take 0.5-1 tablets (5-10 mg total) by mouth at bedtime as needed for muscle spasms.   No facility-administered encounter medications on file as of 06/12/2017.     Review of Systems  Constitutional: Negative for appetite change and unexpected weight change.  HENT: Negative for congestion and sinus pressure.   Respiratory: Negative for cough, chest tightness and shortness of breath.   Cardiovascular: Negative for chest pain and palpitations.  Gastrointestinal: Negative for abdominal pain, diarrhea, nausea and vomiting.   Genitourinary: Negative for difficulty urinating and dysuria.  Musculoskeletal: Negative for myalgias.       Knee pain as outlined.  Right > left.    Skin: Negative for color change and rash.  Neurological: Negative for dizziness, light-headedness and headaches.  Psychiatric/Behavioral: Negative for agitation and dysphoric mood.       Objective:    Physical Exam  Constitutional: She appears well-developed and well-nourished. No distress.  HENT:  Nose: Nose normal.  Mouth/Throat: Oropharynx is clear and moist.  Neck: Neck supple. No thyromegaly present.  Cardiovascular: Normal rate and regular  rhythm.  Pulmonary/Chest: Breath sounds normal. No respiratory distress. She has no wheezes.  Abdominal: Soft. Bowel sounds are normal. There is no tenderness.  Musculoskeletal: She exhibits no edema or tenderness.  Lymphadenopathy:    She has no cervical adenopathy.  Skin: No rash noted. No erythema.  Psychiatric: She has a normal mood and affect. Her behavior is normal.    BP 130/82 (BP Location: Left Arm, Patient Position: Sitting, Cuff Size: Normal)   Pulse 82   Temp 98.1 F (36.7 C) (Oral)   Resp 18   Wt 220 lb 12.8 oz (100.2 kg)   SpO2 97%   BMI 37.90 kg/m  Wt Readings from Last 3 Encounters:  06/12/17 220 lb 12.8 oz (100.2 kg)  04/06/17 221 lb 2 oz (100.3 kg)  11/23/16 220 lb 6.4 oz (100 kg)     Lab Results  Component Value Date   WBC 9.0 03/23/2017   HGB 12.9 03/23/2017   HCT 40.1 03/23/2017   PLT 223.0 03/23/2017   GLUCOSE 121 (H) 03/23/2017   CHOL 189 03/23/2017   TRIG 241.0 (H) 03/23/2017   HDL 58.90 03/23/2017   LDLDIRECT 109.0 03/23/2017   LDLCALC 92 11/21/2016   ALT 16 03/23/2017   AST 15 03/23/2017   NA 138 03/23/2017   K 3.8 03/23/2017   CL 100 03/23/2017   CREATININE 0.54 03/23/2017   BUN 8 03/23/2017   CO2 27 03/23/2017   TSH 3.41 03/23/2017   HGBA1C 6.8 (H) 03/23/2017   MICROALBUR 2.1 (H) 03/23/2017    Mm Digital Screening  Bilateral  Result Date: 08/18/2016 CLINICAL DATA:  Screening. EXAM: DIGITAL SCREENING BILATERAL MAMMOGRAM WITH CAD COMPARISON:  Previous exam(s). ACR Breast Density Category a: The breast tissue is almost entirely fatty. FINDINGS: There are no findings suspicious for malignancy. Images were processed with CAD. IMPRESSION: No mammographic evidence of malignancy. A result letter of this screening mammogram will be mailed directly to the patient. RECOMMENDATION: Screening mammogram in one year. (Code:SM-B-01Y) BI-RADS CATEGORY  1: Negative. Electronically Signed   By: Everlean Alstrom M.D.   On: 08/18/2016 08:00       Assessment & Plan:   Problem List Items Addressed This Visit    Anemia    Follow cbc.       CAD (coronary artery disease)    Continue risk factor modification.        Diabetes mellitus ([place])    Low carb diet and exercise.  Has seen endocrinology.  Follow met b and a1c.        Hypercholesterolemia    On pravastatin.  Low cholesterol diet and exercise.  Follow lipid panel and liver function tests.        Hypertension    Blood pressure under good control.  Continue same medication regimen.  Follow pressures.  Follow metabolic panel.        Knee pain    Persistent knee pain right > left.  Has been taking tylenol and alleve.  Persistent pain.  Desires referral to ortho.  Wants to see Reche Dixon.        Relevant Orders   Ambulatory referral to Orthopedic Surgery   Obesity (BMI 30-39.9)    Discussed diet and exercise.  Follow.       Sleep apnea    CPAP.           Einar Pheasant, MD

## 2017-06-13 ENCOUNTER — Encounter: Payer: Self-pay | Admitting: Internal Medicine

## 2017-06-15 ENCOUNTER — Telehealth: Payer: Self-pay | Admitting: Radiology

## 2017-06-15 NOTE — Telephone Encounter (Signed)
Pt coming in for labs Monday, please place future orders. Thank you 

## 2017-06-16 ENCOUNTER — Other Ambulatory Visit: Payer: Self-pay | Admitting: Internal Medicine

## 2017-06-16 DIAGNOSIS — I10 Essential (primary) hypertension: Secondary | ICD-10-CM

## 2017-06-16 DIAGNOSIS — E7800 Pure hypercholesterolemia, unspecified: Secondary | ICD-10-CM

## 2017-06-16 DIAGNOSIS — E119 Type 2 diabetes mellitus without complications: Secondary | ICD-10-CM

## 2017-06-16 DIAGNOSIS — D649 Anemia, unspecified: Secondary | ICD-10-CM

## 2017-06-16 NOTE — Telephone Encounter (Signed)
Order placed for f/u labs.  

## 2017-06-16 NOTE — Progress Notes (Signed)
Order placed for f/u labs.  

## 2017-06-19 ENCOUNTER — Other Ambulatory Visit (INDEPENDENT_AMBULATORY_CARE_PROVIDER_SITE_OTHER): Payer: BC Managed Care – PPO

## 2017-06-19 ENCOUNTER — Encounter: Payer: Self-pay | Admitting: Internal Medicine

## 2017-06-19 DIAGNOSIS — D649 Anemia, unspecified: Secondary | ICD-10-CM

## 2017-06-19 DIAGNOSIS — E7800 Pure hypercholesterolemia, unspecified: Secondary | ICD-10-CM

## 2017-06-19 DIAGNOSIS — E119 Type 2 diabetes mellitus without complications: Secondary | ICD-10-CM | POA: Diagnosis not present

## 2017-06-19 DIAGNOSIS — M25569 Pain in unspecified knee: Secondary | ICD-10-CM | POA: Insufficient documentation

## 2017-06-19 DIAGNOSIS — I10 Essential (primary) hypertension: Secondary | ICD-10-CM

## 2017-06-19 LAB — HEMOGLOBIN A1C: HEMOGLOBIN A1C: 6.8 % — AB (ref 4.6–6.5)

## 2017-06-19 LAB — CBC WITH DIFFERENTIAL/PLATELET
BASOS ABS: 0.1 10*3/uL (ref 0.0–0.1)
Basophils Relative: 0.6 % (ref 0.0–3.0)
EOS ABS: 0.2 10*3/uL (ref 0.0–0.7)
Eosinophils Relative: 2.1 % (ref 0.0–5.0)
HCT: 39.5 % (ref 36.0–46.0)
HEMOGLOBIN: 12.9 g/dL (ref 12.0–15.0)
Lymphocytes Relative: 31.3 % (ref 12.0–46.0)
Lymphs Abs: 3.1 10*3/uL (ref 0.7–4.0)
MCHC: 32.6 g/dL (ref 30.0–36.0)
MCV: 85.9 fl (ref 78.0–100.0)
MONO ABS: 0.5 10*3/uL (ref 0.1–1.0)
Monocytes Relative: 5.1 % (ref 3.0–12.0)
Neutro Abs: 6 10*3/uL (ref 1.4–7.7)
Neutrophils Relative %: 60.9 % (ref 43.0–77.0)
Platelets: 225 10*3/uL (ref 150.0–400.0)
RBC: 4.6 Mil/uL (ref 3.87–5.11)
RDW: 14.1 % (ref 11.5–15.5)
WBC: 9.8 10*3/uL (ref 4.0–10.5)

## 2017-06-19 LAB — BASIC METABOLIC PANEL
BUN: 14 mg/dL (ref 6–23)
CHLORIDE: 99 meq/L (ref 96–112)
CO2: 29 meq/L (ref 19–32)
CREATININE: 0.66 mg/dL (ref 0.40–1.20)
Calcium: 9.2 mg/dL (ref 8.4–10.5)
GFR: 97.66 mL/min (ref >60.00)
Glucose, Bld: 127 mg/dL — ABNORMAL HIGH (ref 70–99)
POTASSIUM: 4.2 meq/L (ref 3.5–5.1)
Sodium: 139 mEq/L (ref 135–145)

## 2017-06-19 LAB — LIPID PANEL
CHOL/HDL RATIO: 3
Cholesterol: 169 mg/dL (ref 0–200)
HDL: 63.2 mg/dL (ref >39.00)
LDL CALC: 66 mg/dL (ref 0–99)
NonHDL: 105.64
TRIGLYCERIDES: 198 mg/dL — AB (ref 0.0–149.0)
VLDL: 39.6 mg/dL (ref 0.0–40.0)

## 2017-06-19 LAB — HEPATIC FUNCTION PANEL
ALT: 16 U/L (ref 0–35)
AST: 15 U/L (ref 0–37)
Albumin: 3.9 g/dL (ref 3.5–5.2)
Alkaline Phosphatase: 62 U/L (ref 39–117)
Bilirubin, Direct: 0 mg/dL (ref 0.0–0.3)
Total Bilirubin: 0.2 mg/dL (ref 0.2–1.2)
Total Protein: 7 g/dL (ref 6.0–8.3)

## 2017-06-19 LAB — FERRITIN: FERRITIN: 19.6 ng/mL (ref 10.0–291.0)

## 2017-06-19 NOTE — Assessment & Plan Note (Signed)
On pravastatin.  Low cholesterol diet and exercise.  Follow lipid panel and liver function tests.   

## 2017-06-19 NOTE — Assessment & Plan Note (Signed)
Low carb diet and exercise.  Has seen endocrinology.  Follow met b and a1c.

## 2017-06-19 NOTE — Assessment & Plan Note (Signed)
Persistent knee pain right > left.  Has been taking tylenol and alleve.  Persistent pain.  Desires referral to ortho.  Wants to see Dedra Skeensodd Mundy.

## 2017-06-19 NOTE — Assessment & Plan Note (Signed)
Continue risk factor modification 

## 2017-06-19 NOTE — Assessment & Plan Note (Signed)
CPAP.  

## 2017-06-19 NOTE — Assessment & Plan Note (Signed)
Discussed diet and exercise.  Follow.  

## 2017-06-19 NOTE — Assessment & Plan Note (Signed)
Follow cbc.  

## 2017-06-19 NOTE — Assessment & Plan Note (Signed)
Blood pressure under good control.  Continue same medication regimen.  Follow pressures.  Follow metabolic panel.   

## 2017-06-20 ENCOUNTER — Encounter: Payer: Self-pay | Admitting: Internal Medicine

## 2017-07-02 ENCOUNTER — Encounter: Payer: Self-pay | Admitting: Internal Medicine

## 2017-07-03 ENCOUNTER — Other Ambulatory Visit: Payer: Self-pay | Admitting: Internal Medicine

## 2017-07-07 ENCOUNTER — Other Ambulatory Visit: Payer: Self-pay | Admitting: Internal Medicine

## 2017-07-07 MED ORDER — PRAVASTATIN SODIUM 40 MG PO TABS: 40.0000 mg | ORAL_TABLET | Freq: Every day | ORAL | 0 refills | Status: DC

## 2017-07-11 DIAGNOSIS — M1712 Unilateral primary osteoarthritis, left knee: Secondary | ICD-10-CM | POA: Insufficient documentation

## 2017-07-30 ENCOUNTER — Other Ambulatory Visit: Payer: Self-pay | Admitting: Internal Medicine

## 2017-07-31 ENCOUNTER — Other Ambulatory Visit: Payer: Self-pay | Admitting: Internal Medicine

## 2017-08-03 ENCOUNTER — Other Ambulatory Visit: Payer: Self-pay | Admitting: Internal Medicine

## 2017-08-21 ENCOUNTER — Other Ambulatory Visit: Payer: Self-pay | Admitting: Internal Medicine

## 2017-08-29 ENCOUNTER — Other Ambulatory Visit: Payer: Self-pay | Admitting: Internal Medicine

## 2017-08-29 DIAGNOSIS — Z1231 Encounter for screening mammogram for malignant neoplasm of breast: Secondary | ICD-10-CM

## 2017-09-01 ENCOUNTER — Other Ambulatory Visit: Payer: Self-pay | Admitting: Internal Medicine

## 2017-09-05 ENCOUNTER — Ambulatory Visit
Admission: RE | Admit: 2017-09-05 | Discharge: 2017-09-05 | Disposition: A | Discharge status: Home / Self Care | Payer: BC Managed Care – PPO | Source: Ambulatory Visit | Attending: Internal Medicine | Admitting: Internal Medicine

## 2017-09-05 DIAGNOSIS — Z1231 Encounter for screening mammogram for malignant neoplasm of breast: Secondary | ICD-10-CM | POA: Insufficient documentation

## 2017-09-12 ENCOUNTER — Ambulatory Visit (INDEPENDENT_AMBULATORY_CARE_PROVIDER_SITE_OTHER): Payer: BC Managed Care – PPO | Admitting: Internal Medicine

## 2017-09-12 ENCOUNTER — Encounter: Payer: Self-pay | Admitting: Internal Medicine

## 2017-09-12 VITALS — BP 102/70 | HR 86 | Temp 97.8°F | Resp 18 | Ht 64.0 in | Wt 221.2 lb

## 2017-09-12 DIAGNOSIS — Z9109 Other allergy status, other than to drugs and biological substances: Secondary | ICD-10-CM | POA: Diagnosis not present

## 2017-09-12 DIAGNOSIS — I25118 Atherosclerotic heart disease of native coronary artery with other forms of angina pectoris: Secondary | ICD-10-CM

## 2017-09-12 DIAGNOSIS — Z23 Encounter for immunization: Secondary | ICD-10-CM

## 2017-09-12 DIAGNOSIS — Z0000 Encounter for general adult medical examination without abnormal findings: Secondary | ICD-10-CM | POA: Diagnosis not present

## 2017-09-12 DIAGNOSIS — M25561 Pain in right knee: Secondary | ICD-10-CM

## 2017-09-12 DIAGNOSIS — E7800 Pure hypercholesterolemia, unspecified: Secondary | ICD-10-CM

## 2017-09-12 DIAGNOSIS — G4730 Sleep apnea, unspecified: Secondary | ICD-10-CM | POA: Diagnosis not present

## 2017-09-12 DIAGNOSIS — M25562 Pain in left knee: Secondary | ICD-10-CM | POA: Diagnosis not present

## 2017-09-12 DIAGNOSIS — E669 Obesity, unspecified: Secondary | ICD-10-CM

## 2017-09-12 DIAGNOSIS — E119 Type 2 diabetes mellitus without complications: Secondary | ICD-10-CM

## 2017-09-12 DIAGNOSIS — I10 Essential (primary) hypertension: Secondary | ICD-10-CM

## 2017-09-12 DIAGNOSIS — D649 Anemia, unspecified: Secondary | ICD-10-CM | POA: Diagnosis not present

## 2017-09-12 LAB — HM DIABETES FOOT EXAM

## 2017-09-12 MED ORDER — CYCLOBENZAPRINE HCL 10 MG PO TABS: 5.0000 mg | ORAL_TABLET | Freq: Every evening | ORAL | 0 refills | Status: DC | PRN

## 2017-09-12 NOTE — Progress Notes (Signed)
Patient ID: Evelyn James, female   DOB: [birth year], 59 y.o.   MRN: [id number]   Subjective:    Patient ID: Evelyn James, female    DOB: 1961/01/25, 57 y.o.   MRN: [id number]  HPI  Patient here for her physical exam.  She reports she is doing relatively well.  Saw ortho 06/2017.  Bilateral knee pain - degenerative joint disease.  Placed on mobic and received injection.  Helped.  Does not take mobic regularly now.  Saw Dr Gabriel Carina 06/2017.  Recommended continuing metfromin, farxiga and trulicity and f/u in 6 months.  States am sugars averaging ,120 and pm sugars 130s.  No low sugars.  No chest pain.  No sob.  No acid reflux.  No abdominal pain.  Bowels moving.   Some constipation.  Taking citrucel.  Helping.  Some nausea occasionally.  Nothing on a regular basis.  No vomiting.  Using cpap.     Past Medical History:  Diagnosis Date  . Anemia    Noted 11/2011  . Anxiety   . Chest pain    Admitted 11/2011: cath with mild nonobstructive coronary plaque, normal EF, negative cardiac enzymes and negative d-dimer.  . Diabetes mellitus   . Fatty liver disease, nonalcoholic 2637  . GERD (gastroesophageal reflux disease)   . Hypercholesterolemia   . Hypertension   . Reflux   . TMJ dysfunction    Past Surgical History:  Procedure Laterality Date  . ABDOMINAL HYSTERECTOMY    . BILATERAL SALPINGOOPHORECTOMY     benign ovarian tumor  . IMAGE GUIDED SINUS SURGERY N/A 10/22/2015   Procedure: IMAGE GUIDED SINUS SURGERY;  Surgeon: Carloyn Manner, MD;  Location: [facility] ORS;  Service: ENT;  Laterality: N/A;  . KNEE SURGERY    . LEFT HEART CATHETERIZATION WITH CORONARY ANGIOGRAM N/A 12/09/2011   Procedure: LEFT HEART CATHETERIZATION WITH CORONARY ANGIOGRAM;  Surgeon: Minus Breeding, MD;  Location: [facility] CATH LAB;  Service: Cardiovascular;  Laterality: N/A;  . SPHENOIDECTOMY Left 10/22/2015   Procedure: SPHENOIDECTOMY;  Surgeon: Carloyn Manner, MD;  Location: [facility] ORS;  Service: ENT;  Laterality: Left;  .  SUBMANDIBULAR MASS EXCISION     Family History  Problem Relation Age of Onset  . Heart disease Father        Father had CABG in his 51s  . Colon polyps Father   . Lung cancer Mother   . Breast cancer Paternal Grandmother   . Arthritis/Rheumatoid Paternal Grandfather    Social History   Socioeconomic History  . Marital status: Divorced    Spouse name: Not on file  . Number of children: 2  . Years of education: Not on file  . Highest education level: Not on file  Occupational History  . Not on file  Social Needs  . Financial resource strain: Not on file  . Food insecurity:    Worry: Not on file    Inability: Not on file  . Transportation needs:    Medical: Not on file    Non-medical: Not on file  Tobacco Use  . Smoking status: Never Smoker  . Smokeless tobacco: Never Used  Substance and Sexual Activity  . Alcohol use: No    Alcohol/week: 0.0 oz  . Drug use: No  . Sexual activity: Not on file  Lifestyle  . Physical activity:    Days per week: Not on file    Minutes per session: Not on file  . Stress: Not on file  Relationships  . Social connections:  Talks on phone: Not on file    Gets together: Not on file    Attends religious service: Not on file    Active member of club or organization: Not on file    Attends meetings of clubs or organizations: Not on file    Relationship status: Not on file  Other Topics Concern  . Not on file  Social History Narrative  . Not on file    Outpatient Encounter Medications as of 09/12/2017  Medication Sig  . acetaminophen (TYLENOL) 500 MG tablet Take 1,000 mg by mouth every 6 (six) hours as needed for mild pain.  . Blood Glucose Monitoring Suppl (ONE TOUCH ULTRA 2) w/Device KIT U D TO CHECK BLOOD SUGARS  . cetirizine (ZYRTEC) 10 MG tablet Take 10 mg by mouth daily.  . cyanocobalamin (,VITAMIN B-12,) 1000 MCG/ML injection inject 1 milliliter intramuscularly every month  . cyclobenzaprine (FLEXERIL) 10 MG tablet Take 0.5-1  tablets (5-10 mg total) by mouth at bedtime as needed for muscle spasms.  Marland Kitchen escitalopram (LEXAPRO) 20 MG tablet TAKE 1 TABLET BY MOUTH EVERY DAY  . esomeprazole (NEXIUM) 20 MG capsule Take 20 mg by mouth daily at 12 noon.  Marland Kitchen FARXIGA 10 MG TABS tablet TAKE ONE TABLET BY MOUTH EVERY MORNING  . Fe Fum-FePoly-Vit C-Vit B3 (INTEGRA) 62.5-62.5-40-3 MG CAPS TAKE 1 CAPSULE BY MOUTH TWICE DAILY  . fluticasone (FLONASE) 50 MCG/ACT nasal spray Place 2 sprays into both nostrils daily.  Marland Kitchen glucose blood test strip three times before mills.  Marland Kitchen lisinopril (PRINIVIL,ZESTRIL) 20 MG tablet TAKE 1 TABLET BY MOUTH ONCE DAILY  . Melatonin 10 MG TABS Take 10 mg by mouth at bedtime as needed (sleep).  . meloxicam (MOBIC) 15 MG tablet Take 15 mg by mouth daily.  . metFORMIN (GLUCOPHAGE) 1000 MG tablet TAKE ONE TABLET BY MOUTH TWICE A DAY WITH MEALS  . mupirocin ointment (BACTROBAN) 2 % Place 1 application into the nose 2 (two) times daily.  . pravastatin (PRAVACHOL) 40 MG tablet TAKE 1 TABLET(40 MG) BY MOUTH DAILY  . PREMARIN 0.45 MG tablet TAKE 1 TABLET BY MOUTH ONCE DAILY  . Probiotic Product (PHILLIPS COLON HEALTH PO) Take 1 tablet by mouth daily as needed (colon health).   . TRULICITY 1.5 XB/9.3JQ SOPN inject 1.5 milligrams subcutaneously every 7 days  . [DISCONTINUED] amoxicillin (AMOXIL) 875 MG tablet Take 1 tablet (875 mg total) by mouth 2 (two) times daily. (Patient not taking: Reported on 04/06/2017)  . [DISCONTINUED] CONTOUR NEXT TEST test strip TEST once daily  . [DISCONTINUED] cyclobenzaprine (FLEXERIL) 10 MG tablet Take 0.5-1 tablets (5-10 mg total) by mouth at bedtime as needed for muscle spasms.  . [DISCONTINUED] scopolamine (TRANSDERM-SCOP, 1.5 MG,) 1 MG/3DAYS Place 1 patch (1.5 mg total) onto the skin every 3 (three) days.   No facility-administered encounter medications on file as of 09/12/2017.     Review of Systems  Constitutional: Negative for appetite change and unexpected weight change.  HENT:  Negative for congestion and sinus pressure.   Eyes: Negative for pain and visual disturbance.  Respiratory: Negative for cough, chest tightness and shortness of breath.   Cardiovascular: Negative for chest pain, palpitations and leg swelling.  Gastrointestinal: Negative for abdominal pain, diarrhea, nausea and vomiting.  Genitourinary: Negative for difficulty urinating and dysuria.  Musculoskeletal: Negative for myalgias.       Knee pain as outlined.  Seeing ortho.   Skin: Negative for color change and rash.  Neurological: Negative for dizziness, light-headedness and headaches.  Hematological: Negative for adenopathy. Does not bruise/bleed easily.  Psychiatric/Behavioral: Negative for agitation and dysphoric mood.       Objective:    Physical Exam  Constitutional: She is oriented to person, place, and time. She appears well-developed and well-nourished. No distress.  HENT:  Nose: Nose normal.  Mouth/Throat: Oropharynx is clear and moist.  Eyes: Right eye exhibits no discharge. Left eye exhibits no discharge. No scleral icterus.  Neck: Neck supple. No thyromegaly present.  Cardiovascular: Normal rate and regular rhythm.  Pulmonary/Chest: Breath sounds normal. No accessory muscle usage. No tachypnea. No respiratory distress. She has no decreased breath sounds. She has no wheezes. She has no rhonchi. Right breast exhibits no inverted nipple, no mass, no nipple discharge and no tenderness (no axillary adenopathy). Left breast exhibits no inverted nipple, no mass, no nipple discharge and no tenderness (no axilarry adenopathy).  Abdominal: Soft. Bowel sounds are normal. There is no tenderness.  Musculoskeletal: She exhibits no edema or tenderness.  Feet:  No lesions.  DP pulses palpable and equal bilaterally.  Sensation intact to pin prick and light touch.    Lymphadenopathy:    She has no cervical adenopathy.  Neurological: She is alert and oriented to person, place, and time.  Skin: No  rash noted. No erythema.  Psychiatric: She has a normal mood and affect. Her behavior is normal.    BP 102/70 (BP Location: Left Arm, Patient Position: Sitting, Cuff Size: Large)   Pulse 86   Temp 97.8 F (36.6 C) (Oral)   Resp 18   Ht 5' 4" (1.626 m)   Wt 221 lb 3.2 oz (100.3 kg)   SpO2 96%   BMI 37.97 kg/m  Wt Readings from Last 3 Encounters:  09/12/17 221 lb 3.2 oz (100.3 kg)  06/12/17 220 lb 12.8 oz (100.2 kg)  04/06/17 221 lb 2 oz (100.3 kg)     Lab Results  Component Value Date   WBC 9.8 06/19/2017   HGB 12.9 06/19/2017   HCT 39.5 06/19/2017   PLT 225.0 06/19/2017   GLUCOSE 127 (H) 06/19/2017   CHOL 169 06/19/2017   TRIG 198.0 (H) 06/19/2017   HDL 63.20 06/19/2017   LDLDIRECT 109.0 03/23/2017   LDLCALC 66 06/19/2017   ALT 16 06/19/2017   AST 15 06/19/2017   NA 139 06/19/2017   K 4.2 06/19/2017   CL 99 06/19/2017   CREATININE 0.66 06/19/2017   BUN 14 06/19/2017   CO2 29 06/19/2017   TSH 3.41 03/23/2017   HGBA1C 6.8 (H) 06/19/2017   MICROALBUR 2.1 (H) 03/23/2017    Mm 3d Screen Breast Bilateral  Result Date: 09/05/2017 CLINICAL DATA:  Screening. EXAM: DIGITAL SCREENING BILATERAL MAMMOGRAM WITH TOMO AND CAD COMPARISON:  Previous exam(s). ACR Breast Density Category b: There are scattered areas of fibroglandular density. FINDINGS: There are no findings suspicious for malignancy. Images were processed with CAD. IMPRESSION: No mammographic evidence of malignancy. A result letter of this screening mammogram will be mailed directly to the patient. RECOMMENDATION: Screening mammogram in one year. (Code:SM-B-01Y) BI-RADS CATEGORY  1: Negative. Electronically Signed   By: Everlean Alstrom M.D.   On: 09/05/2017 15:04       Assessment & Plan:   Problem List Items Addressed This Visit    Anemia    Follow cbc.        Relevant Orders   CBC with Differential/Platelet   Ferritin   CAD (coronary artery disease)    Currently asymptomatic.  Continue risk factor  modification.  Diabetes mellitus ([place])    Low carb diet and exercise.  Seeing endocrinology.  Follow met b and a1c.        Relevant Orders   Hemoglobin G9M   Basic metabolic panel   Environmental allergies    Controlled on current regimen.  Follow.        Health care maintenance    Physical today 09/11/17.  Colonoscopy 05/04/14.  Mammogram 09/05/17 - Birads I.        Hypercholesterolemia    On pravastatin.  Low cholesterol diet and exercise.  Follow lipid panel and liver function tests.        Relevant Orders   Hepatic function panel   Lipid panel   Hypertension    Blood pressure under good control.  Continue same medication regimen.  Follow pressures.  Follow metabolic panel.        Knee pain    Bilateral knee pain.  Seeing ortho as outlined.  S/p injection.  Taking mobic prn.  Follow.  Is helping.        Obesity (BMI 30-39.9)    Discussed diet and exercise.  Follow.        Sleep apnea    CPAP.        Other Visit Diagnoses    Routine general medical examination at a health care facility    -  Primary   Need for 23-polyvalent pneumococcal polysaccharide vaccine       Relevant Orders   Pneumococcal polysaccharide vaccine 23-valent greater than or equal to 2yo subcutaneous/IM (Completed)       Einar Pheasant, MD

## 2017-09-12 NOTE — Assessment & Plan Note (Signed)
Physical today 09/11/17.  Colonoscopy 05/04/14.  Mammogram 09/05/17 - Birads I.

## 2017-09-17 ENCOUNTER — Encounter: Payer: Self-pay | Admitting: Internal Medicine

## 2017-09-17 NOTE — Assessment & Plan Note (Signed)
Bilateral knee pain.  Seeing ortho as outlined.  S/p injection.  Taking mobic prn.  Follow.  Is helping.

## 2017-09-17 NOTE — Assessment & Plan Note (Signed)
Low carb diet and exercise.  Seeing endocrinology.  Follow met b and a1c.

## 2017-09-17 NOTE — Assessment & Plan Note (Signed)
On pravastatin.  Low cholesterol diet and exercise.  Follow lipid panel and liver function tests.   

## 2017-09-17 NOTE — Assessment & Plan Note (Signed)
Follow cbc.  

## 2017-09-17 NOTE — Assessment & Plan Note (Signed)
Blood pressure under good control.  Continue same medication regimen.  Follow pressures.  Follow metabolic panel.   

## 2017-09-17 NOTE — Assessment & Plan Note (Signed)
Discussed diet and exercise.  Follow.  

## 2017-09-17 NOTE — Assessment & Plan Note (Signed)
Currently asymptomatic.  Continue risk factor modification.  

## 2017-09-17 NOTE — Assessment & Plan Note (Signed)
Controlled on current regimen.  Follow.  

## 2017-09-17 NOTE — Assessment & Plan Note (Signed)
CPAP.  

## 2017-09-30 ENCOUNTER — Other Ambulatory Visit: Payer: Self-pay | Admitting: Internal Medicine

## 2017-10-02 ENCOUNTER — Other Ambulatory Visit: Payer: Self-pay | Admitting: Internal Medicine

## 2017-10-02 NOTE — Telephone Encounter (Signed)
Last seen and last filled 09/12/17 ok to fill?

## 2017-10-02 NOTE — Telephone Encounter (Signed)
Just sent in 30 tablets on 10/02/17.  She should not be out.  Does she need now.

## 2017-10-24 ENCOUNTER — Other Ambulatory Visit: Payer: Self-pay

## 2017-10-24 ENCOUNTER — Other Ambulatory Visit: Payer: Self-pay | Admitting: Internal Medicine

## 2017-10-24 ENCOUNTER — Encounter: Payer: Self-pay | Admitting: Internal Medicine

## 2017-10-24 MED ORDER — INTEGRA 62.5-62.5-40-3 MG PO CAPS: 1.0000 | ORAL_CAPSULE | Freq: Two times a day (BID) | ORAL | 1 refills | Status: DC

## 2017-10-28 ENCOUNTER — Other Ambulatory Visit: Payer: Self-pay | Admitting: Internal Medicine

## 2017-11-21 ENCOUNTER — Other Ambulatory Visit (INDEPENDENT_AMBULATORY_CARE_PROVIDER_SITE_OTHER): Payer: BC Managed Care – PPO

## 2017-11-21 DIAGNOSIS — E7800 Pure hypercholesterolemia, unspecified: Secondary | ICD-10-CM | POA: Diagnosis not present

## 2017-11-21 DIAGNOSIS — E119 Type 2 diabetes mellitus without complications: Secondary | ICD-10-CM | POA: Diagnosis not present

## 2017-11-21 DIAGNOSIS — D649 Anemia, unspecified: Secondary | ICD-10-CM

## 2017-11-21 LAB — CBC WITH DIFFERENTIAL/PLATELET
BASOS ABS: 0.1 10*3/uL (ref 0.0–0.1)
Basophils Relative: 0.5 % (ref 0.0–3.0)
Eosinophils Absolute: 0.1 10*3/uL (ref 0.0–0.7)
Eosinophils Relative: 1.1 % (ref 0.0–5.0)
HCT: 38 % (ref 36.0–46.0)
Hemoglobin: 12.2 g/dL (ref 12.0–15.0)
LYMPHS ABS: 2.9 10*3/uL (ref 0.7–4.0)
LYMPHS PCT: 29.9 % (ref 12.0–46.0)
MCHC: 32 g/dL (ref 30.0–36.0)
MCV: 86.5 fl (ref 78.0–100.0)
MONOS PCT: 6 % (ref 3.0–12.0)
Monocytes Absolute: 0.6 10*3/uL (ref 0.1–1.0)
NEUTROS PCT: 62.5 % (ref 43.0–77.0)
Neutro Abs: 6.1 10*3/uL (ref 1.4–7.7)
Platelets: 208 10*3/uL (ref 150.0–400.0)
RBC: 4.4 Mil/uL (ref 3.87–5.11)
RDW: 14.1 % (ref 11.5–15.5)
WBC: 9.8 10*3/uL (ref 4.0–10.5)

## 2017-11-21 LAB — FERRITIN: FERRITIN: 26.2 ng/mL (ref 10.0–291.0)

## 2017-11-21 LAB — BASIC METABOLIC PANEL
BUN: 11 mg/dL (ref 6–23)
CHLORIDE: 102 meq/L (ref 96–112)
CO2: 31 mEq/L (ref 19–32)
Calcium: 9 mg/dL (ref 8.4–10.5)
Creatinine, Ser: 0.57 mg/dL (ref 0.40–1.20)
GFR: 115.49 mL/min (ref >60.00)
Glucose, Bld: 123 mg/dL — ABNORMAL HIGH (ref 70–99)
POTASSIUM: 4.2 meq/L (ref 3.5–5.1)
Sodium: 140 mEq/L (ref 135–145)

## 2017-11-21 LAB — HEPATIC FUNCTION PANEL
ALT: 13 U/L (ref 0–35)
AST: 10 U/L (ref 0–37)
Albumin: 3.8 g/dL (ref 3.5–5.2)
Alkaline Phosphatase: 58 U/L (ref 39–117)
BILIRUBIN TOTAL: 0.3 mg/dL (ref 0.2–1.2)
Bilirubin, Direct: 0 mg/dL (ref 0.0–0.3)
Total Protein: 6.8 g/dL (ref 6.0–8.3)

## 2017-11-21 LAB — LIPID PANEL
Cholesterol: 180 mg/dL (ref 0–200)
HDL: 68.4 mg/dL (ref >39.00)
LDL Cholesterol: 77 mg/dL (ref 0–99)
NonHDL: 111.81
Total CHOL/HDL Ratio: 3
Triglycerides: 173 mg/dL — ABNORMAL HIGH (ref 0.0–149.0)
VLDL: 34.6 mg/dL (ref 0.0–40.0)

## 2017-11-21 LAB — HEMOGLOBIN A1C: HEMOGLOBIN A1C: 6.8 % — AB (ref 4.6–6.5)

## 2017-11-22 ENCOUNTER — Ambulatory Visit: Payer: BC Managed Care – PPO | Admitting: Internal Medicine

## 2017-11-22 ENCOUNTER — Encounter: Payer: Self-pay | Admitting: Internal Medicine

## 2017-11-22 DIAGNOSIS — I25118 Atherosclerotic heart disease of native coronary artery with other forms of angina pectoris: Secondary | ICD-10-CM | POA: Diagnosis not present

## 2017-11-22 DIAGNOSIS — M25561 Pain in right knee: Secondary | ICD-10-CM

## 2017-11-22 DIAGNOSIS — D649 Anemia, unspecified: Secondary | ICD-10-CM | POA: Diagnosis not present

## 2017-11-22 DIAGNOSIS — E119 Type 2 diabetes mellitus without complications: Secondary | ICD-10-CM

## 2017-11-22 DIAGNOSIS — M25562 Pain in left knee: Secondary | ICD-10-CM

## 2017-11-22 DIAGNOSIS — E7800 Pure hypercholesterolemia, unspecified: Secondary | ICD-10-CM

## 2017-11-22 DIAGNOSIS — E669 Obesity, unspecified: Secondary | ICD-10-CM

## 2017-11-22 DIAGNOSIS — I10 Essential (primary) hypertension: Secondary | ICD-10-CM

## 2017-11-22 DIAGNOSIS — J329 Chronic sinusitis, unspecified: Secondary | ICD-10-CM

## 2017-11-22 MED ORDER — AMOXICILLIN-POT CLAVULANATE 875-125 MG PO TABS: 1.0000 | ORAL_TABLET | Freq: Two times a day (BID) | ORAL | 0 refills | Status: DC

## 2017-11-22 NOTE — Patient Instructions (Signed)
Saline nasal spray - flush nose at least 2-3x/day  flonase nasal spray - 2 sprays each nostril one time per day.  Do this in the evening.    Continue the probiotic.    Take the augmentin twice a day.    Continue mucinex.

## 2017-11-22 NOTE — Progress Notes (Signed)
Patient ID: Evelyn James, female   DOB: March 23, 1960, 57 y.o.   MRN: [id number]   Subjective:    Patient ID: Evelyn James, female    DOB: [birth year], 57 y.o.   MRN: [id number]  HPI  Patient here for a scheduled follow up.  She reports that over the last two weeks she has noticed increased sinus pressure, congestion and drainage.  Some headache related to her sinus.  No unusual headache.  Increased cough - productive of colored mucus.  Right earache/fullness.  Occasional sore throat.  Taking mucinex.  Persistent symptoms.  States sugars have been doing better.  AM sugars averaging 100-130 and pm sugars averaging 120-140s.  Trying to stay active.  We discussed diet and exercise.  Had cortisone injection 06/2017.  F/u 10/2017 for another injection.  Persistent problems.  Planning to follow with ortho to discuss other treatment options.  Trying to exercise in the pool.  No chest pain.  No sob.  No acid reflux.  No abdominal pain.  Bowels moving.     Past Medical History:  Diagnosis Date  . Anemia    Noted 11/2011  . Anxiety   . Chest pain    Admitted 11/2011: cath with mild nonobstructive coronary plaque, normal EF, negative cardiac enzymes and negative d-dimer.  . Diabetes mellitus   . Fatty liver disease, nonalcoholic 3474  . GERD (gastroesophageal reflux disease)   . Hypercholesterolemia   . Hypertension   . Reflux   . TMJ dysfunction    Past Surgical History:  Procedure Laterality Date  . ABDOMINAL HYSTERECTOMY    . BILATERAL SALPINGOOPHORECTOMY     benign ovarian tumor  . IMAGE GUIDED SINUS SURGERY N/A 10/22/2015   Procedure: IMAGE GUIDED SINUS SURGERY;  Surgeon: Carloyn Manner, MD;  Location: [facility] ORS;  Service: ENT;  Laterality: N/A;  . KNEE SURGERY    . LEFT HEART CATHETERIZATION WITH CORONARY ANGIOGRAM N/A 12/09/2011   Procedure: LEFT HEART CATHETERIZATION WITH CORONARY ANGIOGRAM;  Surgeon: Minus Breeding, MD;  Location: [facility] CATH LAB;  Service: Cardiovascular;  Laterality: N/A;    . SPHENOIDECTOMY Left 10/22/2015   Procedure: SPHENOIDECTOMY;  Surgeon: Carloyn Manner, MD;  Location: [facility] ORS;  Service: ENT;  Laterality: Left;  . SUBMANDIBULAR MASS EXCISION     Family History  Problem Relation Age of Onset  . Heart disease Father        Father had CABG in his 76s  . Colon polyps Father   . Lung cancer Mother   . Breast cancer Paternal Grandmother   . Arthritis/Rheumatoid Paternal Grandfather    Social History   Socioeconomic History  . Marital status: Divorced    Spouse name: Not on file  . Number of children: 2  . Years of education: Not on file  . Highest education level: Not on file  Occupational History  . Not on file  Social Needs  . Financial resource strain: Not on file  . Food insecurity:    Worry: Not on file    Inability: Not on file  . Transportation needs:    Medical: Not on file    Non-medical: Not on file  Tobacco Use  . Smoking status: Never Smoker  . Smokeless tobacco: Never Used  Substance and Sexual Activity  . Alcohol use: No    Alcohol/week: 0.0 standard drinks  . Drug use: No  . Sexual activity: Not on file  Lifestyle  . Physical activity:    Days per week: Not on file  Minutes per session: Not on file  . Stress: Not on file  Relationships  . Social connections:    Talks on phone: Not on file    Gets together: Not on file    Attends religious service: Not on file    Active member of club or organization: Not on file    Attends meetings of clubs or organizations: Not on file    Relationship status: Not on file  Other Topics Concern  . Not on file  Social History Narrative  . Not on file    Outpatient Encounter Medications as of 11/22/2017  Medication Sig  . acetaminophen (TYLENOL) 500 MG tablet Take 1,000 mg by mouth every 6 (six) hours as needed for mild pain.  Marland Kitchen amoxicillin-clavulanate (AUGMENTIN) 875-125 MG tablet Take 1 tablet by mouth 2 (two) times daily.  . Blood Glucose Monitoring Suppl (ONE TOUCH ULTRA  2) w/Device KIT U D TO CHECK BLOOD SUGARS  . cetirizine (ZYRTEC) 10 MG tablet Take 10 mg by mouth daily.  . cyanocobalamin (,VITAMIN B-12,) 1000 MCG/ML injection inject 1 milliliter intramuscularly every month  . cyclobenzaprine (FLEXERIL) 10 MG tablet Take 0.5-1 tablets (5-10 mg total) by mouth at bedtime as needed for muscle spasms.  Marland Kitchen escitalopram (LEXAPRO) 20 MG tablet TAKE 1 TABLET BY MOUTH EVERY DAY  . esomeprazole (NEXIUM) 20 MG capsule Take 20 mg by mouth daily at 12 noon.  Marland Kitchen FARXIGA 10 MG TABS tablet TAKE ONE TABLET BY MOUTH EVERY MORNING  . Fe Fum-FePoly-Vit C-Vit B3 (INTEGRA) 62.5-62.5-40-3 MG CAPS Take 1 capsule by mouth 2 (two) times daily.  . fluticasone (FLONASE) 50 MCG/ACT nasal spray Place 2 sprays into both nostrils daily.  Marland Kitchen glucose blood test strip three times before mills.  Marland Kitchen lisinopril (PRINIVIL,ZESTRIL) 20 MG tablet TAKE 1 TABLET BY MOUTH ONCE DAILY  . Melatonin 10 MG TABS Take 10 mg by mouth at bedtime as needed (sleep).  . meloxicam (MOBIC) 15 MG tablet Take 15 mg by mouth daily.  . metFORMIN (GLUCOPHAGE) 1000 MG tablet TAKE ONE TABLET BY MOUTH TWICE A DAY WITH MEALS  . mupirocin ointment (BACTROBAN) 2 % Place 1 application into the nose 2 (two) times daily.  . pravastatin (PRAVACHOL) 40 MG tablet TAKE 1 TABLET(40 MG) BY MOUTH DAILY  . PREMARIN 0.45 MG tablet TAKE 1 TABLET BY MOUTH ONCE DAILY  . Probiotic Product (PHILLIPS COLON HEALTH PO) Take 1 tablet by mouth daily as needed (colon health).   . TRULICITY 1.5 ZD/6.6YQ SOPN inject 1.5 milligrams subcutaneously every 7 days   No facility-administered encounter medications on file as of 11/22/2017.     Review of Systems  Constitutional: Negative for appetite change and unexpected weight change.  HENT: Positive for congestion, postnasal drip, sinus pressure and sore throat.   Respiratory: Positive for cough. Negative for chest tightness and shortness of breath.   Cardiovascular: Negative for chest pain, palpitations  and leg swelling.  Gastrointestinal: Negative for abdominal pain, diarrhea, nausea and vomiting.  Genitourinary: Negative for difficulty urinating and dysuria.  Musculoskeletal: Negative for myalgias.       Knee pain as outlined.    Skin: Negative for color change and rash.  Neurological: Negative for dizziness, light-headedness and headaches.  Psychiatric/Behavioral: Negative for agitation and dysphoric mood.       Objective:    Physical Exam  Constitutional: She appears well-developed and well-nourished.  HENT:  Nose: Nose normal.  Mouth/Throat: Oropharynx is clear and moist.  Minimal tenderness to palpation over the sinuses.  TMs without erythema.  Nares - slightly erythematous turbinates.    Neck: Neck supple. No thyromegaly present.  Cardiovascular: Normal rate and regular rhythm.  Pulmonary/Chest: Breath sounds normal. No respiratory distress. She has no wheezes.  Abdominal: Soft. Bowel sounds are normal. There is no tenderness.  Musculoskeletal: She exhibits no edema or tenderness.  Lymphadenopathy:    She has no cervical adenopathy.  Skin: No rash noted. No erythema.  Psychiatric: She has a normal mood and affect. Her behavior is normal.    BP 132/78 (BP Location: Left Arm, Patient Position: Sitting, Cuff Size: Large)   Pulse 79   Temp 97.8 F (36.6 C) (Oral)   Resp 18   Wt 223 lb 9.6 oz (101.4 kg)   SpO2 96%   BMI 38.38 kg/m  Wt Readings from Last 3 Encounters:  11/22/17 223 lb 9.6 oz (101.4 kg)  09/12/17 221 lb 3.2 oz (100.3 kg)  06/12/17 220 lb 12.8 oz (100.2 kg)     Lab Results  Component Value Date   WBC 9.8 11/21/2017   HGB 12.2 11/21/2017   HCT 38.0 11/21/2017   PLT 208.0 11/21/2017   GLUCOSE 123 (H) 11/21/2017   CHOL 180 11/21/2017   TRIG 173.0 (H) 11/21/2017   HDL 68.40 11/21/2017   LDLDIRECT 109.0 03/23/2017   LDLCALC 77 11/21/2017   ALT 13 11/21/2017   AST 10 11/21/2017   NA 140 11/21/2017   K 4.2 11/21/2017   CL 102 11/21/2017    CREATININE 0.57 11/21/2017   BUN 11 11/21/2017   CO2 31 11/21/2017   TSH 3.41 03/23/2017   HGBA1C 6.8 (H) 11/21/2017   MICROALBUR 2.1 (H) 03/23/2017    Mm 3d Screen Breast Bilateral  Result Date: 09/05/2017 CLINICAL DATA:  Screening. EXAM: DIGITAL SCREENING BILATERAL MAMMOGRAM WITH TOMO AND CAD COMPARISON:  Previous exam(s). ACR Breast Density Category b: There are scattered areas of fibroglandular density. FINDINGS: There are no findings suspicious for malignancy. Images were processed with CAD. IMPRESSION: No mammographic evidence of malignancy. A result letter of this screening mammogram will be mailed directly to the patient. RECOMMENDATION: Screening mammogram in one year. (Code:SM-B-01Y) BI-RADS CATEGORY  1: Negative. Electronically Signed   By: Everlean Alstrom M.D.   On: 09/05/2017 15:04       Assessment & Plan:   Problem List Items Addressed This Visit    Anemia    Follow cbc.  Hgb just checked and wnl.       CAD (coronary artery disease)    Currently asymptomatic.  Continue risk factor modification.       Diabetes mellitus ([place])    Has been followed by endocrinology.  Recent a1c 6.8.  Low carb diet and exercise.  Follow met b and a1c.        Relevant Orders   Hemoglobin Y1O   Basic metabolic panel   Microalbumin / creatinine urine ratio   Hypercholesterolemia    On pravastatin.  Low cholesterol diet and exercise.  Follow lipid panel and liver function tests.        Relevant Orders   Hepatic function panel   Lipid panel   Hypertension    Blood pressure under good control.  Continue same medication regimen.  Follow pressures.  Follow metabolic panel.        Relevant Orders   TSH   Knee pain    Seeing ortho.  S/p injection.  Plans f/u with ortho to discuss other treatment options.  Obesity (BMI 30-39.9)    Discussed diet and exercise.  Follow.       Sinusitis    Symptoms as outlined.  Present over the last two weeks.  Taking mucinex.  Treat with  augmentin as directed.  Take probiotic.  Saline nasal spray and nasacort as directed.  Continue mucinex. Follow.        Relevant Medications   amoxicillin-clavulanate (AUGMENTIN) 875-125 MG tablet       Einar Pheasant, MD

## 2017-11-25 DIAGNOSIS — J329 Chronic sinusitis, unspecified: Secondary | ICD-10-CM | POA: Insufficient documentation

## 2017-11-25 NOTE — Assessment & Plan Note (Signed)
Currently asymptomatic.  Continue risk factor modification.  

## 2017-11-25 NOTE — Assessment & Plan Note (Signed)
Blood pressure under good control.  Continue same medication regimen.  Follow pressures.  Follow metabolic panel.   

## 2017-11-25 NOTE — Assessment & Plan Note (Signed)
On pravastatin.  Low cholesterol diet and exercise.  Follow lipid panel and liver function tests.   

## 2017-11-25 NOTE — Assessment & Plan Note (Addendum)
Follow cbc.  Hgb just checked and wnl.

## 2017-11-25 NOTE — Assessment & Plan Note (Signed)
Has been followed by endocrinology.  Recent a1c 6.8.  Low carb diet and exercise.  Follow met b and a1c.

## 2017-11-25 NOTE — Assessment & Plan Note (Signed)
Symptoms as outlined.  Present over the last two weeks.  Taking mucinex.  Treat with augmentin as directed.  Take probiotic.  Saline nasal spray and nasacort as directed.  Continue mucinex. Follow.

## 2017-11-25 NOTE — Assessment & Plan Note (Signed)
Discussed diet and exercise.  Follow.  

## 2017-11-25 NOTE — Assessment & Plan Note (Signed)
Seeing ortho.  S/p injection.  Plans f/u with ortho to discuss other treatment options.

## 2017-11-30 ENCOUNTER — Other Ambulatory Visit: Payer: Self-pay | Admitting: Internal Medicine

## 2017-11-30 ENCOUNTER — Encounter: Payer: Self-pay | Admitting: Internal Medicine

## 2017-11-30 NOTE — Telephone Encounter (Signed)
Last OV 11/22/2017   Sent to PCP to advise

## 2017-12-01 MED ORDER — FLUCONAZOLE 150 MG PO TABS: ORAL_TABLET | ORAL | 0 refills | Status: DC

## 2017-12-01 NOTE — Telephone Encounter (Signed)
rx sent in for diflucan  

## 2017-12-01 NOTE — Telephone Encounter (Signed)
ok'd rx for farxiga, premarin, lisinopril, integra and lexapro

## 2017-12-30 ENCOUNTER — Other Ambulatory Visit: Payer: Self-pay | Admitting: Internal Medicine

## 2018-01-29 ENCOUNTER — Other Ambulatory Visit: Payer: Self-pay | Admitting: Internal Medicine

## 2018-02-28 ENCOUNTER — Other Ambulatory Visit: Payer: Self-pay | Admitting: Internal Medicine

## 2018-03-26 ENCOUNTER — Other Ambulatory Visit (INDEPENDENT_AMBULATORY_CARE_PROVIDER_SITE_OTHER): Payer: BC Managed Care – PPO

## 2018-03-26 DIAGNOSIS — E7800 Pure hypercholesterolemia, unspecified: Secondary | ICD-10-CM

## 2018-03-26 DIAGNOSIS — I10 Essential (primary) hypertension: Secondary | ICD-10-CM

## 2018-03-26 DIAGNOSIS — E119 Type 2 diabetes mellitus without complications: Secondary | ICD-10-CM

## 2018-03-26 LAB — HEPATIC FUNCTION PANEL
ALT: 16 U/L (ref 0–35)
AST: 13 U/L (ref 0–37)
Albumin: 4 g/dL (ref 3.5–5.2)
Alkaline Phosphatase: 59 U/L (ref 39–117)
BILIRUBIN DIRECT: 0 mg/dL (ref 0.0–0.3)
BILIRUBIN TOTAL: 0.2 mg/dL (ref 0.2–1.2)
Total Protein: 6.9 g/dL (ref 6.0–8.3)

## 2018-03-26 LAB — LIPID PANEL
CHOLESTEROL: 180 mg/dL (ref 0–200)
HDL: 70.8 mg/dL (ref >39.00)
LDL Cholesterol: 77 mg/dL (ref 0–99)
NonHDL: 109.26
Total CHOL/HDL Ratio: 3
Triglycerides: 163 mg/dL — ABNORMAL HIGH (ref 0.0–149.0)
VLDL: 32.6 mg/dL (ref 0.0–40.0)

## 2018-03-26 LAB — BASIC METABOLIC PANEL
BUN: 13 mg/dL (ref 6–23)
CALCIUM: 9 mg/dL (ref 8.4–10.5)
CO2: 30 mEq/L (ref 19–32)
Chloride: 100 mEq/L (ref 96–112)
Creatinine, Ser: 0.57 mg/dL (ref 0.40–1.20)
GFR: 115.39 mL/min (ref >60.00)
Glucose, Bld: 129 mg/dL — ABNORMAL HIGH (ref 70–99)
Potassium: 4 mEq/L (ref 3.5–5.1)
Sodium: 139 mEq/L (ref 135–145)

## 2018-03-26 LAB — MICROALBUMIN / CREATININE URINE RATIO
CREATININE, U: 139.4 mg/dL
MICROALB UR: 7.5 mg/dL — AB (ref 0.0–1.9)
MICROALB/CREAT RATIO: 5.4 mg/g (ref 0.0–30.0)

## 2018-03-26 LAB — HEMOGLOBIN A1C: Hgb A1c MFr Bld: 7 % — ABNORMAL HIGH (ref 4.6–6.5)

## 2018-03-26 LAB — TSH: TSH: 1.88 u[IU]/mL (ref 0.35–4.50)

## 2018-03-28 ENCOUNTER — Encounter: Payer: Self-pay | Admitting: Internal Medicine

## 2018-03-28 ENCOUNTER — Ambulatory Visit: Payer: BC Managed Care – PPO | Admitting: Internal Medicine

## 2018-03-28 VITALS — BP 128/76 | HR 89 | Temp 97.3°F | Resp 16 | Wt 224.6 lb

## 2018-03-28 DIAGNOSIS — R1084 Generalized abdominal pain: Secondary | ICD-10-CM

## 2018-03-28 DIAGNOSIS — Z9109 Other allergy status, other than to drugs and biological substances: Secondary | ICD-10-CM

## 2018-03-28 DIAGNOSIS — R110 Nausea: Secondary | ICD-10-CM | POA: Diagnosis not present

## 2018-03-28 DIAGNOSIS — E119 Type 2 diabetes mellitus without complications: Secondary | ICD-10-CM

## 2018-03-28 DIAGNOSIS — I25118 Atherosclerotic heart disease of native coronary artery with other forms of angina pectoris: Secondary | ICD-10-CM

## 2018-03-28 DIAGNOSIS — K5900 Constipation, unspecified: Secondary | ICD-10-CM | POA: Diagnosis not present

## 2018-03-28 DIAGNOSIS — D649 Anemia, unspecified: Secondary | ICD-10-CM

## 2018-03-28 DIAGNOSIS — E7800 Pure hypercholesterolemia, unspecified: Secondary | ICD-10-CM

## 2018-03-28 DIAGNOSIS — R109 Unspecified abdominal pain: Secondary | ICD-10-CM | POA: Diagnosis not present

## 2018-03-28 DIAGNOSIS — I10 Essential (primary) hypertension: Secondary | ICD-10-CM

## 2018-03-28 NOTE — Progress Notes (Signed)
Patient ID: [name], female   DOB: January 18, 1961, 58 y.o.   MRN: [id number]   Subjective:    Patient ID: Evelyn James, female    DOB: [birth year], 58 y.o.   MRN: [id number]  HPI  Patient here for a scheduled follow up.  She reports she has been doing relatively well.  Trying to stay active.  No chest pain.  No sob.  No acid reflux reported.  Does report some increased bloating and nausea.  Troubles with her bowels.  Some constipation.  No urine change.  Has seen Dr Gabriel Carina for her diabetes.  Discussed recent labs.  a1c 7.0.  States she has not been watching her diet and has not been exercising as much.  Plans to start exercising more.  Plans to start doing better with her diet.     Past Medical History:  Diagnosis Date  . Anemia    Noted 11/2011  . Anxiety   . Chest pain    Admitted 11/2011: cath with mild nonobstructive coronary plaque, normal EF, negative cardiac enzymes and negative d-dimer.  . Diabetes mellitus   . Fatty liver disease, nonalcoholic 6659  . GERD (gastroesophageal reflux disease)   . Hypercholesterolemia   . Hypertension   . Reflux   . TMJ dysfunction    Past Surgical History:  Procedure Laterality Date  . ABDOMINAL HYSTERECTOMY    . BILATERAL SALPINGOOPHORECTOMY     benign ovarian tumor  . IMAGE GUIDED SINUS SURGERY N/A 10/22/2015   Procedure: IMAGE GUIDED SINUS SURGERY;  Surgeon: Carloyn Manner, MD;  Location: [facility] ORS;  Service: ENT;  Laterality: N/A;  . KNEE SURGERY    . LEFT HEART CATHETERIZATION WITH CORONARY ANGIOGRAM N/A 12/09/2011   Procedure: LEFT HEART CATHETERIZATION WITH CORONARY ANGIOGRAM;  Surgeon: Minus Breeding, MD;  Location: [facility] CATH LAB;  Service: Cardiovascular;  Laterality: N/A;  . SPHENOIDECTOMY Left 10/22/2015   Procedure: SPHENOIDECTOMY;  Surgeon: Carloyn Manner, MD;  Location: [facility] ORS;  Service: ENT;  Laterality: Left;  . SUBMANDIBULAR MASS EXCISION     Family History  Problem Relation Age of Onset  . Heart disease Father    Father had CABG in his 31s  . Colon polyps Father   . Lung cancer Mother   . Breast cancer Paternal Grandmother   . Arthritis/Rheumatoid Paternal Grandfather    Social History   Socioeconomic History  . Marital status: Divorced    Spouse name: Not on file  . Number of children: 2  . Years of education: Not on file  . Highest education level: Not on file  Occupational History  . Not on file  Social Needs  . Financial resource strain: Not on file  . Food insecurity:    Worry: Not on file    Inability: Not on file  . Transportation needs:    Medical: Not on file    Non-medical: Not on file  Tobacco Use  . Smoking status: Never Smoker  . Smokeless tobacco: Never Used  Substance and Sexual Activity  . Alcohol use: No    Alcohol/week: 0.0 standard drinks  . Drug use: No  . Sexual activity: Not on file  Lifestyle  . Physical activity:    Days per week: Not on file    Minutes per session: Not on file  . Stress: Not on file  Relationships  . Social connections:    Talks on phone: Not on file    Gets together: Not on file    Attends religious service:  Not on file    Active member of club or organization: Not on file    Attends meetings of clubs or organizations: Not on file    Relationship status: Not on file  Other Topics Concern  . Not on file  Social History Narrative  . Not on file    Outpatient Encounter Medications as of 03/28/2018  Medication Sig  . acetaminophen (TYLENOL) 500 MG tablet Take 1,000 mg by mouth every 6 (six) hours as needed for mild pain.  . Blood Glucose Monitoring Suppl (ONE TOUCH ULTRA 2) w/Device KIT U D TO CHECK BLOOD SUGARS  . cetirizine (ZYRTEC) 10 MG tablet Take 10 mg by mouth daily.  . cyanocobalamin (,VITAMIN B-12,) 1000 MCG/ML injection inject 1 milliliter intramuscularly every month  . cyclobenzaprine (FLEXERIL) 10 MG tablet Take 0.5-1 tablets (5-10 mg total) by mouth at bedtime as needed for muscle spasms.  Marland Kitchen escitalopram (LEXAPRO) 20  MG tablet TAKE 1 TABLET BY MOUTH EVERY DAY  . esomeprazole (NEXIUM) 20 MG capsule Take 20 mg by mouth daily at 12 noon.  Marland Kitchen FARXIGA 10 MG TABS tablet TAKE ONE TABLET BY MOUTH EVERY MORNING  . Fe Fum-FePoly-Vit C-Vit B3 (INTEGRA) 62.5-62.5-40-3 MG CAPS TAKE 1 CAPSULE BY MOUTH TWICE DAILY  . fluconazole (DIFLUCAN) 150 MG tablet Take one tablet x 1.  If persistent symptoms after three days may repeat x 1.  . fluticasone (FLONASE) 50 MCG/ACT nasal spray Place 2 sprays into both nostrils daily.  Marland Kitchen glucose blood test strip three times before mills.  Marland Kitchen lisinopril (PRINIVIL,ZESTRIL) 20 MG tablet TAKE 1 TABLET BY MOUTH ONCE DAILY  . Melatonin 10 MG TABS Take 10 mg by mouth at bedtime as needed (sleep).  . meloxicam (MOBIC) 15 MG tablet Take 15 mg by mouth daily.  . metFORMIN (GLUCOPHAGE) 1000 MG tablet TAKE ONE TABLET BY MOUTH TWICE A DAY WITH MEALS  . mupirocin ointment (BACTROBAN) 2 % Place 1 application into the nose 2 (two) times daily.  Marland Kitchen PREMARIN 0.45 MG tablet TAKE 1 TABLET BY MOUTH EVERY DAY  . Probiotic Product (PHILLIPS COLON HEALTH PO) Take 1 tablet by mouth daily as needed (colon health).   . TRULICITY 1.5 ON/6.2XB SOPN inject 1.5 milligrams subcutaneously every 7 days  . [DISCONTINUED] amoxicillin-clavulanate (AUGMENTIN) 875-125 MG tablet Take 1 tablet by mouth 2 (two) times daily.  . [DISCONTINUED] pravastatin (PRAVACHOL) 40 MG tablet TAKE 1 TABLET(40 MG) BY MOUTH DAILY   No facility-administered encounter medications on file as of 03/28/2018.     Review of Systems  Constitutional: Negative for appetite change and unexpected weight change.  HENT: Negative for congestion and sinus pressure.   Respiratory: Negative for cough, chest tightness and shortness of breath.   Cardiovascular: Negative for chest pain, palpitations and leg swelling.  Gastrointestinal: Negative for vomiting.       Abdominal discomfort, nausea and bloating.  Some constipation.    Genitourinary: Negative for  difficulty urinating and dysuria.  Musculoskeletal: Negative for joint swelling and myalgias.  Skin: Negative for color change and rash.  Neurological: Negative for dizziness, light-headedness and headaches.  Psychiatric/Behavioral: Negative for agitation and dysphoric mood.       Objective:    Physical Exam Constitutional:      General: She is not in acute distress.    Appearance: Normal appearance.  HENT:     Nose: Nose normal. No congestion.     Mouth/Throat:     Pharynx: No oropharyngeal exudate or posterior oropharyngeal erythema.  Neck:  Musculoskeletal: Neck supple. No muscular tenderness.     Thyroid: No thyromegaly.  Cardiovascular:     Rate and Rhythm: Normal rate and regular rhythm.  Pulmonary:     Effort: No respiratory distress.     Breath sounds: Normal breath sounds. No wheezing.  Abdominal:     General: Bowel sounds are normal.     Palpations: Abdomen is soft.     Tenderness: There is no abdominal tenderness.  Musculoskeletal:        General: No swelling or tenderness.  Lymphadenopathy:     Cervical: No cervical adenopathy.  Skin:    Findings: No erythema or rash.  Neurological:     Mental Status: She is alert.  Psychiatric:        Mood and Affect: Mood normal.        Behavior: Behavior normal.     BP 128/76 (BP Location: Left Arm, Patient Position: Sitting, Cuff Size: Normal)   Pulse 89   Temp (!) 97.3 F (36.3 C) (Oral)   Resp 16   Wt 224 lb 9.6 oz (101.9 kg)   SpO2 98%   BMI 38.55 kg/m  Wt Readings from Last 3 Encounters:  03/28/18 224 lb 9.6 oz (101.9 kg)  11/22/17 223 lb 9.6 oz (101.4 kg)  09/12/17 221 lb 3.2 oz (100.3 kg)     Lab Results  Component Value Date   WBC 9.8 11/21/2017   HGB 12.2 11/21/2017   HCT 38.0 11/21/2017   PLT 208.0 11/21/2017   GLUCOSE 129 (H) 03/26/2018   CHOL 180 03/26/2018   TRIG 163.0 (H) 03/26/2018   HDL 70.80 03/26/2018   LDLDIRECT 109.0 03/23/2017   LDLCALC 77 03/26/2018   ALT 16 03/26/2018    AST 13 03/26/2018   NA 139 03/26/2018   K 4.0 03/26/2018   CL 100 03/26/2018   CREATININE 0.57 03/26/2018   BUN 13 03/26/2018   CO2 30 03/26/2018   TSH 1.88 03/26/2018   HGBA1C 7.0 (H) 03/26/2018   MICROALBUR 7.5 (H) 03/26/2018    Mm 3d Screen Breast Bilateral  Result Date: 09/05/2017 CLINICAL DATA:  Screening. EXAM: DIGITAL SCREENING BILATERAL MAMMOGRAM WITH TOMO AND CAD COMPARISON:  Previous exam(s). ACR Breast Density Category b: There are scattered areas of fibroglandular density. FINDINGS: There are no findings suspicious for malignancy. Images were processed with CAD. IMPRESSION: No mammographic evidence of malignancy. A result letter of this screening mammogram will be mailed directly to the patient. RECOMMENDATION: Screening mammogram in one year. (Code:SM-B-01Y) BI-RADS CATEGORY  1: Negative. Electronically Signed   By: Everlean Alstrom M.D.   On: 09/05/2017 15:04       Assessment & Plan:   Problem List Items Addressed This Visit    Abdominal pain    Abdominal pain, nausea and bloating.  Also with some constipation.  Persistent. Discussed GI referral.  Schedule for CT abdomen/pelvis.  Take miralax scheduled.  Follow.        Relevant Orders   CT Abdomen Pelvis W Contrast   CT Abdomen Pelvis W Contrast   Anemia    Follow cbc.        CAD (coronary artery disease)    Currently asymptomatic.  Continue risk factor modification.        Constipation    Obtain CT scan as outlined.  Miralax.        Relevant Orders   CT Abdomen Pelvis W Contrast   Diabetes mellitus ([place])    Discussed recent a1c 7.0.  Discussed diet  and exercise.  She plans to get more serious about her diet and exercise.  Hold on changing medication.  Follow met b and a1c.       Environmental allergies    Controlled.       Hypercholesterolemia    On pravastatin.  Low cholesterol diet and exercise.  Follow lipid panel and liver function tests.        Hypertension    Blood pressure under good  control.  Continue same medication regimen.  Follow pressures.  Follow metabolic panel.        Nausea - Primary   Relevant Orders   CT Abdomen Pelvis W Contrast       Einar Pheasant, MD

## 2018-03-30 ENCOUNTER — Other Ambulatory Visit: Payer: Self-pay | Admitting: Internal Medicine

## 2018-04-01 ENCOUNTER — Encounter: Payer: Self-pay | Admitting: Internal Medicine

## 2018-04-01 NOTE — Assessment & Plan Note (Signed)
Follow cbc.  

## 2018-04-01 NOTE — Assessment & Plan Note (Signed)
Obtain CT scan as outlined.  Miralax.

## 2018-04-01 NOTE — Assessment & Plan Note (Signed)
Discussed recent a1c 7.0.  Discussed diet and exercise.  She plans to get more serious about her diet and exercise.  Hold on changing medication.  Follow met b and a1c.

## 2018-04-01 NOTE — Assessment & Plan Note (Signed)
Blood pressure under good control.  Continue same medication regimen.  Follow pressures.  Follow metabolic panel.   

## 2018-04-01 NOTE — Assessment & Plan Note (Signed)
On pravastatin.  Low cholesterol diet and exercise.  Follow lipid panel and liver function tests.   

## 2018-04-01 NOTE — Assessment & Plan Note (Signed)
Controlled.  

## 2018-04-01 NOTE — Assessment & Plan Note (Signed)
Currently asymptomatic.  Continue risk factor modification.  

## 2018-04-01 NOTE — Assessment & Plan Note (Signed)
Abdominal pain, nausea and bloating.  Also with some constipation.  Persistent. Discussed GI referral.  Schedule for CT abdomen/pelvis.  Take miralax scheduled.  Follow.

## 2018-04-09 ENCOUNTER — Ambulatory Visit: Payer: BC Managed Care – PPO | Admitting: Family Medicine

## 2018-04-09 ENCOUNTER — Encounter: Payer: Self-pay | Admitting: Family Medicine

## 2018-04-09 VITALS — BP 134/62 | HR 88 | Temp 97.6°F | Resp 16 | Ht 64.0 in | Wt 221.4 lb

## 2018-04-09 DIAGNOSIS — J019 Acute sinusitis, unspecified: Secondary | ICD-10-CM

## 2018-04-09 DIAGNOSIS — R05 Cough: Secondary | ICD-10-CM | POA: Diagnosis not present

## 2018-04-09 DIAGNOSIS — R059 Cough, unspecified: Secondary | ICD-10-CM

## 2018-04-09 MED ORDER — BENZONATATE 100 MG PO CAPS: 100.0000 mg | ORAL_CAPSULE | Freq: Three times a day (TID) | ORAL | 0 refills | Status: DC | PRN

## 2018-04-09 MED ORDER — AMOXICILLIN-POT CLAVULANATE 875-125 MG PO TABS: 1.0000 | ORAL_TABLET | Freq: Two times a day (BID) | ORAL | 0 refills | Status: DC

## 2018-04-09 NOTE — Progress Notes (Signed)
Subjective:    Patient ID: Evelyn James, female    DOB: 1960-08-04, 58 y.o.   MRN: [id number]  HPI   Patient presents to clinic complaining of sinus pressure, sinus headache, thick nasal drainage, pain above teeth and cough for past 10 days.  Patient has taken over-the-counter Coricidin and a few doses of Sudafed with minimal effect in helping symptoms.  Also has Flonase nasal spray that she has been using without much effect in reducing congestion.  Denies fever or chills.  Denies nausea/vomiting or diarrhea.  Denies chest pain.  Denies shortness of breath or wheezing.  Patient Active Problem List   Diagnosis Date Noted  . Constipation 03/28/2018  . Abdominal pain 03/28/2018  . Sinusitis 11/25/2017  . Knee pain 06/19/2017  . Left hip pain 07/12/2015  . Epigastric pain 07/12/2015  . Headache 07/09/2015  . Nausea 02/08/2015  . UTI symptoms 09/18/2014  . Health care maintenance 07/06/2014  . Pelvic pressure in female 07/06/2014  . Dysuria 02/05/2014  . Obesity (BMI 30-39.9) 01/01/2014  . Pain of right side of body 02/26/2013  . Environmental allergies 02/26/2013  . CAD (coronary artery disease) 03/05/2012  . Sleep apnea 03/05/2012  . Hypertension 02/27/2012  . Anemia 02/27/2012  . Hypercholesterolemia 02/27/2012  . Diabetes mellitus (HCC) 02/27/2012   Social History   Tobacco Use  . Smoking status: Never Smoker  . Smokeless tobacco: Never Used  Substance Use Topics  . Alcohol use: No    Alcohol/week: 0.0 standard drinks   Review of Systems  Constitutional: Negative for chills, fatigue and fever.  HENT: +congestion, ear pain, sinus pain, sinus drainage.    Eyes: Negative.   Respiratory: +cough. Negative for shortness of breath and wheezing.   Cardiovascular: Negative for chest pain, palpitations and leg swelling.  Gastrointestinal: Negative for abdominal pain, diarrhea, nausea and vomiting.  Genitourinary: Negative for dysuria, frequency and urgency.    Musculoskeletal: Negative for arthralgias and myalgias.  Skin: Negative for color change, pallor and rash.  Neurological: Negative for syncope, light-headedness and headaches.  Psychiatric/Behavioral: The patient is not nervous/anxious.       Objective:   Physical Exam Vitals signs and nursing note reviewed.  Constitutional:      General: She is not in acute distress.    Appearance: She is not toxic-appearing or diaphoretic.  HENT:     Head: Normocephalic and atraumatic.     Ears:     Comments: Fullness bilat TMs    Nose: Congestion and rhinorrhea present.     Right Sinus: Maxillary sinus tenderness and frontal sinus tenderness present.     Left Sinus: Maxillary sinus tenderness and frontal sinus tenderness present.     Comments: +thick nasal discharges and post nasal drip    Mouth/Throat:     Mouth: Mucous membranes are moist.  Eyes:     General: No scleral icterus.    Extraocular Movements: Extraocular movements intact.     Conjunctiva/sclera: Conjunctivae normal.  Neck:     Musculoskeletal: Neck supple. No neck rigidity.  Cardiovascular:     Rate and Rhythm: Normal rate and regular rhythm.     Heart sounds: Normal heart sounds.  Pulmonary:     Effort: Pulmonary effort is normal. No respiratory distress.     Breath sounds: Normal breath sounds. No wheezing, rhonchi or rales.  Lymphadenopathy:     Cervical: No cervical adenopathy.  Skin:    General: Skin is warm and dry.     Coloration: Skin is  not pale.  Neurological:     Mental Status: She is alert and oriented to person, place, and time.    Vitals:   04/09/18 1003  BP: 134/62  Pulse: 88  Resp: 16  Temp: 97.6 F (36.4 C)  SpO2: 95%      Assessment & Plan:   Acute sinusitis/cough - patient's symptoms and exam are concerning for sinusitis.  We will treat her with Augmentin twice daily for 10 days.  Advised to eat a yogurt daily with this medication.  Also advised she can continue to use her Flonase nasal  spray in addition to doing saline nasal rinses to help reduce congestion.  Advised to rest, increase fluid intake and do good handwashing.  Lungs are clear on exam, suspect cough related to postnasal drainage.  Patient will trial Tessalon Perles as needed to see if they help reduce cough.  Keep regularly scheduled follow-up with PCP as planned.  Return to clinic sooner if any issues arise.

## 2018-04-09 NOTE — Progress Notes (Signed)
Pre visit review using our clinic review tool, if applicable. No additional management support is needed unless otherwise documented below in the visit note. 

## 2018-04-10 ENCOUNTER — Ambulatory Visit
Admission: RE | Admit: 2018-04-10 | Discharge: 2018-04-10 | Disposition: A | Discharge status: Home / Self Care | Payer: BC Managed Care – PPO | Source: Ambulatory Visit | Attending: Internal Medicine | Admitting: Internal Medicine

## 2018-04-10 DIAGNOSIS — K5900 Constipation, unspecified: Secondary | ICD-10-CM | POA: Insufficient documentation

## 2018-04-10 DIAGNOSIS — R110 Nausea: Secondary | ICD-10-CM | POA: Diagnosis present

## 2018-04-10 DIAGNOSIS — R109 Unspecified abdominal pain: Secondary | ICD-10-CM | POA: Insufficient documentation

## 2018-04-10 DIAGNOSIS — R1084 Generalized abdominal pain: Secondary | ICD-10-CM | POA: Insufficient documentation

## 2018-04-10 MED ORDER — IOPAMIDOL (ISOVUE-300) INJECTION 61%
100.0000 mL | Freq: Once | INTRAVENOUS | Status: AC | PRN
  Administered 2018-04-10: 100 mL via INTRAVENOUS

## 2018-04-12 ENCOUNTER — Other Ambulatory Visit: Payer: Self-pay | Admitting: Internal Medicine

## 2018-04-12 DIAGNOSIS — R110 Nausea: Secondary | ICD-10-CM

## 2018-04-12 DIAGNOSIS — K760 Fatty (change of) liver, not elsewhere classified: Secondary | ICD-10-CM

## 2018-04-12 DIAGNOSIS — R140 Abdominal distension (gaseous): Secondary | ICD-10-CM

## 2018-04-12 NOTE — Progress Notes (Signed)
Order placed for GI referral.   

## 2018-04-17 ENCOUNTER — Encounter: Payer: Self-pay | Admitting: Internal Medicine

## 2018-04-23 ENCOUNTER — Other Ambulatory Visit: Payer: Self-pay | Admitting: Internal Medicine

## 2018-04-24 NOTE — Telephone Encounter (Signed)
This is not a medication that we just refill.  Why does she need diflucan?

## 2018-04-25 NOTE — Telephone Encounter (Signed)
Patient stated that she has a yeast infection and had one diflucan at home. She is requesting a refill in case she needs another pill. She finished an antibiotic last week which caused the yeast infection.

## 2018-04-27 ENCOUNTER — Other Ambulatory Visit (HOSPITAL_COMMUNITY)
Admission: RE | Admit: 2018-04-27 | Discharge: 2018-04-27 | Disposition: A | Discharge status: Home / Self Care | Payer: BC Managed Care – PPO | Source: Ambulatory Visit | Attending: Internal Medicine | Admitting: Internal Medicine

## 2018-04-27 ENCOUNTER — Encounter: Payer: Self-pay | Admitting: Internal Medicine

## 2018-04-27 ENCOUNTER — Other Ambulatory Visit: Payer: Self-pay | Admitting: Internal Medicine

## 2018-04-27 ENCOUNTER — Ambulatory Visit (INDEPENDENT_AMBULATORY_CARE_PROVIDER_SITE_OTHER): Payer: BC Managed Care – PPO | Admitting: Internal Medicine

## 2018-04-27 VITALS — BP 120/70 | HR 93 | Temp 98.0°F | Wt 222.6 lb

## 2018-04-27 DIAGNOSIS — N760 Acute vaginitis: Secondary | ICD-10-CM

## 2018-04-27 DIAGNOSIS — R109 Unspecified abdominal pain: Secondary | ICD-10-CM

## 2018-04-27 DIAGNOSIS — R140 Abdominal distension (gaseous): Secondary | ICD-10-CM | POA: Diagnosis not present

## 2018-04-27 DIAGNOSIS — I10 Essential (primary) hypertension: Secondary | ICD-10-CM

## 2018-04-27 DIAGNOSIS — D649 Anemia, unspecified: Secondary | ICD-10-CM

## 2018-04-27 DIAGNOSIS — R195 Other fecal abnormalities: Secondary | ICD-10-CM

## 2018-04-27 DIAGNOSIS — E7800 Pure hypercholesterolemia, unspecified: Secondary | ICD-10-CM

## 2018-04-27 DIAGNOSIS — N898 Other specified noninflammatory disorders of vagina: Secondary | ICD-10-CM

## 2018-04-27 DIAGNOSIS — R110 Nausea: Secondary | ICD-10-CM

## 2018-04-27 DIAGNOSIS — I25118 Atherosclerotic heart disease of native coronary artery with other forms of angina pectoris: Secondary | ICD-10-CM

## 2018-04-27 DIAGNOSIS — E119 Type 2 diabetes mellitus without complications: Secondary | ICD-10-CM

## 2018-04-27 DIAGNOSIS — B001 Herpesviral vesicular dermatitis: Secondary | ICD-10-CM

## 2018-04-27 NOTE — Telephone Encounter (Signed)
Copied from CRM [id number]. Topic: Quick Communication - Rx Refill/Question >> Apr 27, 2018  2:31 PM Jilda Roche wrote: Medication: fluconazole (DIFLUCAN) 150 MG tablet     Has the patient contacted their pharmacy? Yes.   (Agent: If no, request that the patient contact the pharmacy for the refill.) (Agent: If yes, when and what did the pharmacy advise?)  Preferred Pharmacy (with phone number or street name): [id number] [place], [place] - [address] AT [facility] OF [phone] (Phone) [phone] (Fax)    Agent: Please be advised that RX refills may take up to 3 business days. We ask that you follow-up with your pharmacy.

## 2018-04-27 NOTE — Telephone Encounter (Signed)
Patient states she was seen today by Dr Lorin Picket- patient was under the impression the the fluconazole was already taken care of and she did not pursue Rx with PCP. Patient states it is not at pharmacy and she wants to make sure it gets called in- patient states she has yeast infection. Will resend the request for review.

## 2018-04-27 NOTE — Progress Notes (Signed)
Patient ID: Evelyn James, female   DOB: 12/28/1960, 58 y.o.   MRN: [id number]   Subjective:    Patient ID: Evelyn James, female    DOB: 05-21-1960, 58 y.o.   MRN: [id number]  HPI  Patient here for a scheduled follow up.   She reports she is still having some issues with abdominal cramping and nausea.  She has been eating less.  Some decreased appetite.  Has been eating soup, etc.  Some nausea daily.  Some loose/soft stool.  Some mucus at times.  Recently treated with abx for sinus infection.  Sinus and respiratory symptoms improved.  No chest pain.  No sob. Reports noticing some tingling - vaginal lesion.  No known discharge.  Does report cold sore - corner of lip.  No fever.     Past Medical History:  Diagnosis Date  . Anemia    Noted 11/2011  . Anxiety   . Chest pain    Admitted 11/2011: cath with mild nonobstructive coronary plaque, normal EF, negative cardiac enzymes and negative d-dimer.  . Diabetes mellitus   . Fatty liver disease, nonalcoholic 0932  . GERD (gastroesophageal reflux disease)   . Hypercholesterolemia   . Hypertension   . Reflux   . TMJ dysfunction    Past Surgical History:  Procedure Laterality Date  . ABDOMINAL HYSTERECTOMY    . BILATERAL SALPINGOOPHORECTOMY     benign ovarian tumor  . IMAGE GUIDED SINUS SURGERY N/A 10/22/2015   Procedure: IMAGE GUIDED SINUS SURGERY;  Surgeon: Carloyn Manner, MD;  Location: [facility] ORS;  Service: ENT;  Laterality: N/A;  . KNEE SURGERY    . LEFT HEART CATHETERIZATION WITH CORONARY ANGIOGRAM N/A 12/09/2011   Procedure: LEFT HEART CATHETERIZATION WITH CORONARY ANGIOGRAM;  Surgeon: Minus Breeding, MD;  Location: [facility] CATH LAB;  Service: Cardiovascular;  Laterality: N/A;  . SPHENOIDECTOMY Left 10/22/2015   Procedure: SPHENOIDECTOMY;  Surgeon: Carloyn Manner, MD;  Location: [facility] ORS;  Service: ENT;  Laterality: Left;  . SUBMANDIBULAR MASS EXCISION     Family History  Problem Relation Age of Onset  . Heart disease Father    Father had CABG in his 98s  . Colon polyps Father   . Lung cancer Mother   . Breast cancer Paternal Grandmother   . Arthritis/Rheumatoid Paternal Grandfather    Social History   Socioeconomic History  . Marital status: Divorced    Spouse name: Not on file  . Number of children: 2  . Years of education: Not on file  . Highest education level: Not on file  Occupational History  . Not on file  Social Needs  . Financial resource strain: Not on file  . Food insecurity:    Worry: Not on file    Inability: Not on file  . Transportation needs:    Medical: Not on file    Non-medical: Not on file  Tobacco Use  . Smoking status: Never Smoker  . Smokeless tobacco: Never Used  Substance and Sexual Activity  . Alcohol use: No    Alcohol/week: 0.0 standard drinks  . Drug use: No  . Sexual activity: Not on file  Lifestyle  . Physical activity:    Days per week: Not on file    Minutes per session: Not on file  . Stress: Not on file  Relationships  . Social connections:    Talks on phone: Not on file    Gets together: Not on file    Attends religious service: Not on file  Active member of club or organization: Not on file    Attends meetings of clubs or organizations: Not on file    Relationship status: Not on file  Other Topics Concern  . Not on file  Social History Narrative  . Not on file    Outpatient Encounter Medications as of 04/27/2018  Medication Sig  . acetaminophen (TYLENOL) 500 MG tablet Take 1,000 mg by mouth every 6 (six) hours as needed for mild pain.  Marland Kitchen amoxicillin-clavulanate (AUGMENTIN) 875-125 MG tablet Take 1 tablet by mouth 2 (two) times daily.  . benzonatate (TESSALON) 100 MG capsule Take 1 capsule (100 mg total) by mouth 3 (three) times daily as needed for cough.  . Blood Glucose Monitoring Suppl (ONE TOUCH ULTRA 2) w/Device KIT U D TO CHECK BLOOD SUGARS  . cetirizine (ZYRTEC) 10 MG tablet Take 10 mg by mouth daily.  . cyanocobalamin (,VITAMIN B-12,)  1000 MCG/ML injection inject 1 milliliter intramuscularly every month  . cyclobenzaprine (FLEXERIL) 10 MG tablet Take 0.5-1 tablets (5-10 mg total) by mouth at bedtime as needed for muscle spasms.  Marland Kitchen escitalopram (LEXAPRO) 20 MG tablet TAKE 1 TABLET BY MOUTH EVERY DAY  . esomeprazole (NEXIUM) 20 MG capsule Take 20 mg by mouth daily at 12 noon.  Marland Kitchen FARXIGA 10 MG TABS tablet TAKE ONE TABLET BY MOUTH EVERY MORNING  . Fe Fum-FePoly-Vit C-Vit B3 (INTEGRA) 62.5-62.5-40-3 MG CAPS TAKE 1 CAPSULE BY MOUTH TWICE DAILY  . fluconazole (DIFLUCAN) 150 MG tablet Take one tablet x 1.  If persistent symptoms after three days may repeat x 1.  . fluticasone (FLONASE) 50 MCG/ACT nasal spray Place 2 sprays into both nostrils daily.  Marland Kitchen glucose blood test strip three times before mills.  Marland Kitchen lisinopril (PRINIVIL,ZESTRIL) 20 MG tablet TAKE 1 TABLET BY MOUTH ONCE DAILY  . Melatonin 10 MG TABS Take 10 mg by mouth at bedtime as needed (sleep).  . meloxicam (MOBIC) 15 MG tablet Take 15 mg by mouth daily.  . metFORMIN (GLUCOPHAGE) 1000 MG tablet TAKE ONE TABLET BY MOUTH TWICE A DAY WITH MEALS  . mupirocin ointment (BACTROBAN) 2 % Place 1 application into the nose 2 (two) times daily.  . pravastatin (PRAVACHOL) 40 MG tablet TAKE 1 TABLET(40 MG) BY MOUTH DAILY  . PREMARIN 0.45 MG tablet TAKE 1 TABLET BY MOUTH EVERY DAY  . Probiotic Product (PHILLIPS COLON HEALTH PO) Take 1 tablet by mouth daily as needed (colon health).   . TRULICITY 1.5 UR/4.2HC SOPN inject 1.5 milligrams subcutaneously every 7 days  . acyclovir ointment (ZOVIRAX) 5 % Apply 1 application topically every 6 (six) hours as needed.   No facility-administered encounter medications on file as of 04/27/2018.     Review of Systems  Constitutional: Negative for fever.       Some decreased appetite.    HENT: Negative for congestion and sinus pressure.   Respiratory: Negative for cough, chest tightness and shortness of breath.   Cardiovascular: Negative for chest  pain, palpitations and leg swelling.  Gastrointestinal: Positive for abdominal pain. Negative for vomiting.       Some nausea and bowel issues as outlined.  Abdominal cramping.    Genitourinary: Negative for difficulty urinating and dysuria.  Musculoskeletal: Negative for joint swelling and myalgias.  Skin: Negative for color change and rash.  Neurological: Negative for dizziness, light-headedness and headaches.  Psychiatric/Behavioral: Negative for agitation and dysphoric mood.       Objective:    Physical Exam Constitutional:  General: She is not in acute distress.    Appearance: Normal appearance.  HENT:     Nose: Nose normal. No congestion.     Mouth/Throat:     Pharynx: No oropharyngeal exudate or posterior oropharyngeal erythema.  Neck:     Musculoskeletal: Neck supple. No muscular tenderness.     Thyroid: No thyromegaly.  Cardiovascular:     Rate and Rhythm: Normal rate and regular rhythm.  Pulmonary:     Effort: No respiratory distress.     Breath sounds: Normal breath sounds. No wheezing.  Abdominal:     General: Bowel sounds are normal.     Palpations: Abdomen is soft.     Comments: Minimal abdominal discomfort.   Genitourinary:    Comments: Normal external genitalia. Some vaginal lesions - cysts.   Vaginal vault without lesions.  Could not appreciate any adnexal masses or tenderness.   Musculoskeletal:        General: No swelling or tenderness.  Lymphadenopathy:     Cervical: No cervical adenopathy.  Skin:    Findings: No erythema or rash.  Neurological:     Mental Status: She is alert.  Psychiatric:        Mood and Affect: Mood normal.        Behavior: Behavior normal.     BP 120/70   Pulse 93   Temp 98 F (36.7 C) (Oral)   Wt 222 lb 9.6 oz (101 kg)   SpO2 97%   BMI 38.21 kg/m  Wt Readings from Last 3 Encounters:  04/27/18 222 lb 9.6 oz (101 kg)  04/09/18 221 lb 6.4 oz (100.4 kg)  03/28/18 224 lb 9.6 oz (101.9 kg)     Lab Results    Component Value Date   WBC 9.8 11/21/2017   HGB 12.2 11/21/2017   HCT 38.0 11/21/2017   PLT 208.0 11/21/2017   GLUCOSE 129 (H) 03/26/2018   CHOL 180 03/26/2018   TRIG 163.0 (H) 03/26/2018   HDL 70.80 03/26/2018   LDLDIRECT 109.0 03/23/2017   LDLCALC 77 03/26/2018   ALT 16 03/26/2018   AST 13 03/26/2018   NA 139 03/26/2018   K 4.0 03/26/2018   CL 100 03/26/2018   CREATININE 0.57 03/26/2018   BUN 13 03/26/2018   CO2 30 03/26/2018   TSH 1.88 03/26/2018   HGBA1C 7.0 (H) 03/26/2018   MICROALBUR 7.5 (H) 03/26/2018    Ct Abdomen Pelvis W Contrast  Result Date: 04/11/2018 CLINICAL DATA:  Constipation and abdominal bloating with worsening symptoms since 03/17/2018. Generalized abdominal pain. Nausea. EXAM: CT ABDOMEN AND PELVIS WITH CONTRAST TECHNIQUE: Multidetector CT imaging of the abdomen and pelvis was performed using the standard protocol following bolus administration of intravenous contrast. CONTRAST:  179m ISOVUE-300 IOPAMIDOL (ISOVUE-300) INJECTION 61% COMPARISON:  Abdominal ultrasound 07/27/2015. Abdominal MRI 02/16/2010. FINDINGS: Lower chest: Clear lung bases. Hepatobiliary: Diffusely decreased attenuation of the liver consistent with previously demonstrated steatosis. No focal liver abnormality identified. Unremarkable gallbladder. No biliary dilatation. Pancreas: Unremarkable. Spleen: Unremarkable. Adrenals/Urinary Tract: Unremarkable adrenal glands. No evidence of renal mass, calculi, or hydronephrosis. Unremarkable bladder. Stomach/Bowel: The stomach is unremarkable. There is a small diverticulum of the third portion of the duodenum without evidence of acute inflammation. Mild left-sided colonic diverticulosis is noted without evidence of diverticulitis. An ordinary amount of stool is present in the colon. Slight fecalization of distal small bowel loops may reflect delayed transit. There is no evidence of bowel obstruction. The appendix is unremarkable. Vascular/Lymphatic: No  significant vascular findings are  present. No enlarged abdominal or pelvic lymph nodes. Reproductive: Status post hysterectomy. No adnexal masses. Other: No intraperitoneal free fluid. No abdominal wall hernia. Musculoskeletal: No acute osseous abnormality or suspicious osseous lesion. Mild disc degeneration in the thoracic and lumbar spine. IMPRESSION: 1. No acute abnormality identified in the abdomen or pelvis. 2. Hepatic steatosis. Electronically Signed   By: Logan Bores M.D.   On: 04/11/2018 09:00       Assessment & Plan:   Problem List Items Addressed This Visit    Abdominal pain    CT unrevealing.  Refer to GI as outlined.        Anemia    Follow cbc.       CAD (coronary artery disease)    Currently asymptomatic.  Continue risk factor modification.        Cold sore    Zovirax cream.        Relevant Medications   acyclovir ointment (ZOVIRAX) 5 %   Diabetes mellitus (HCC)    Low carb diet and exercise.  Follow met b and a1c.        Hypercholesterolemia    On pravastatin. Low cholesterol diet and exercise.  Follow lipid panel and liver function tests.        Hypertension    Blood pressure under good control.  Continue same medication regimen.  Follow pressures.  Follow metabolic panel.        Nausea    Persistent nausea and abdominal cramping.  Stool change as outlined.  Recently treated with abx.  Check stool for infection.  Recent CT unrevealing.  Refer to GI for further evaluation and treatment.        Vaginal lesion    No evidence of infection.  No further w/up warranted.  Wet prep sent.         Other Visit Diagnoses    Acute vaginitis    -  Primary   Relevant Orders   Cervicovaginal ancillary only( [place])   Bloating       Relevant Orders   Gastrointestinal Pathogen Panel PCR   Loose stools       Relevant Orders   Gastrointestinal Pathogen Panel PCR       Einar Pheasant, MD

## 2018-04-29 ENCOUNTER — Other Ambulatory Visit: Payer: Self-pay | Admitting: Internal Medicine

## 2018-04-30 ENCOUNTER — Encounter: Payer: Self-pay | Admitting: Internal Medicine

## 2018-04-30 ENCOUNTER — Ambulatory Visit: Payer: BC Managed Care – PPO | Admitting: Internal Medicine

## 2018-04-30 DIAGNOSIS — L0291 Cutaneous abscess, unspecified: Secondary | ICD-10-CM

## 2018-04-30 DIAGNOSIS — N898 Other specified noninflammatory disorders of vagina: Secondary | ICD-10-CM | POA: Insufficient documentation

## 2018-04-30 DIAGNOSIS — B001 Herpesviral vesicular dermatitis: Secondary | ICD-10-CM | POA: Insufficient documentation

## 2018-04-30 LAB — CERVICOVAGINAL ANCILLARY ONLY
Bacterial vaginitis: NEGATIVE
Candida vaginitis: POSITIVE — AB

## 2018-04-30 MED ORDER — MUPIROCIN 2 % EX OINT: 1.0000 "application " | TOPICAL_OINTMENT | Freq: Two times a day (BID) | CUTANEOUS | 0 refills | Status: DC

## 2018-04-30 MED ORDER — ACYCLOVIR 5 % EX OINT: 1.0000 "application " | TOPICAL_OINTMENT | Freq: Four times a day (QID) | CUTANEOUS | 0 refills | Status: DC | PRN

## 2018-04-30 MED ORDER — FLUCONAZOLE 150 MG PO TABS: ORAL_TABLET | ORAL | 0 refills | Status: DC

## 2018-04-30 NOTE — Assessment & Plan Note (Signed)
On pravastatin.  Low cholesterol diet and exercise.  Follow lipid panel and liver function tests.   

## 2018-04-30 NOTE — Telephone Encounter (Signed)
Are you okay with me sending these in for her?

## 2018-04-30 NOTE — Assessment & Plan Note (Signed)
Persistent nausea and abdominal cramping.  Stool change as outlined.  Recently treated with abx.  Check stool for infection.  Recent CT unrevealing.  Refer to GI for further evaluation and treatment.

## 2018-04-30 NOTE — Telephone Encounter (Signed)
Pt aware.

## 2018-04-30 NOTE — Assessment & Plan Note (Signed)
Low carb diet and exercise.  Follow met b and a1c.   

## 2018-04-30 NOTE — Assessment & Plan Note (Signed)
No evidence of infection.  No further w/up warranted.  Wet prep sent.

## 2018-04-30 NOTE — Assessment & Plan Note (Signed)
CT unrevealing.  Refer to GI as outlined.

## 2018-04-30 NOTE — Assessment & Plan Note (Signed)
Currently asymptomatic.  Continue risk factor modification.  

## 2018-04-30 NOTE — Addendum Note (Signed)
Addended by: Warden Fillers on: 04/30/2018 02:02 PM   Modules accepted: Orders

## 2018-04-30 NOTE — Assessment & Plan Note (Signed)
Blood pressure under good control.  Continue same medication regimen.  Follow pressures.  Follow metabolic panel.   

## 2018-04-30 NOTE — Assessment & Plan Note (Signed)
Zovirax cream

## 2018-04-30 NOTE — Telephone Encounter (Signed)
rx sent in for diflucan - see result note.  I had already sent in zovirax ointment and I just sent in rx for bactroban.  (see result note - had to wait for vaginal swab results to know if needed diflucan)

## 2018-04-30 NOTE — Assessment & Plan Note (Signed)
Follow cbc.  

## 2018-05-04 LAB — GASTROINTESTINAL PATHOGEN PANEL PCR
C. difficile Tox A/B, PCR: DETECTED — AB
Campylobacter, PCR: NOT DETECTED
Cryptosporidium, PCR: NOT DETECTED
E coli (ETEC) LT/ST PCR: NOT DETECTED
E coli (STEC) stx1/stx2, PCR: NOT DETECTED
E coli 0157, PCR: NOT DETECTED
GIARDIA LAMBLIA, PCR: NOT DETECTED
NOROVIRUS, PCR: NOT DETECTED
Rotavirus A, PCR: NOT DETECTED
SHIGELLA, PCR: NOT DETECTED
Salmonella, PCR: NOT DETECTED

## 2018-05-07 ENCOUNTER — Telehealth: Payer: Self-pay | Admitting: *Deleted

## 2018-05-07 MED ORDER — VANCOMYCIN HCL 125 MG PO CAPS: 125.0000 mg | ORAL_CAPSULE | Freq: Four times a day (QID) | ORAL | 0 refills | Status: AC

## 2018-05-07 NOTE — Telephone Encounter (Signed)
Copied from CRM [id number]. Topic: General - Other >> May 07, 2018  8:04 AM Percival Spanish wrote:  Pt call to say Dr Lorin Picket called her about c diff and it is ok to send the RX vancomycin 125mg  qid x 10 days.   Pharmacy [facility] [place]

## 2018-05-07 NOTE — Telephone Encounter (Signed)
Notified patient of result s and placed order for medication to pharmacy as ordered. Patient voiced understanding to results and also advised patient on transmission precautions.

## 2018-05-07 NOTE — Telephone Encounter (Signed)
-----   Message from Dale [place], MD sent at 05/06/2018  7:39 PM EST ----- Tried to call pt multiple times over the weekend.  Unable to reach.  Notify her that her stool test is positive for c.diff.  Need to treat with vancomycin 125mg  qid x 10 days.  Confirm only allergy is to sulfa.  Is so, need to send in oral vancomycin 125mg  qid x 10 days.  I can send in if need to.  Thanks

## 2018-05-16 ENCOUNTER — Encounter: Payer: Self-pay | Admitting: Internal Medicine

## 2018-05-17 NOTE — Telephone Encounter (Signed)
Patient is going to urgent care 

## 2018-05-17 NOTE — Telephone Encounter (Signed)
She needs to be seen.  I cannot tell in this picture if she is holding her mouth down (to try and better visualize the outbreak for her picture) or if she is having any drooping.  With the way the outbreak looks, needs to be seen to determine etiology.  The ointment she was given was to have if needed for fever blisters.

## 2018-05-18 ENCOUNTER — Encounter: Payer: Self-pay | Admitting: Internal Medicine

## 2018-05-18 NOTE — Telephone Encounter (Signed)
The valtrex is an antiviral.  This should not cause diarrhea.  Take probiotic daily.  If pain and loose stool, will need to be reevaluated.

## 2018-05-18 NOTE — Telephone Encounter (Signed)
Pt completed abx and confirmed that she is not having any other symptoms other than the watery stools and moderate stomach pain. Advised patient that she may need to be evaluated again but I would send over to PCP to advise

## 2018-05-18 NOTE — Telephone Encounter (Signed)
Patient is aware and will do as directed below. Advised her that PCP and myself will be out of the office the beginning of next week so if she has persistent symptoms should be evaluated over the weekend or call the office instead of sending through my chart

## 2018-05-28 ENCOUNTER — Ambulatory Visit: Payer: BC Managed Care – PPO | Admitting: Family Medicine

## 2018-05-28 ENCOUNTER — Other Ambulatory Visit: Payer: Self-pay

## 2018-05-28 ENCOUNTER — Encounter: Payer: Self-pay | Admitting: Family Medicine

## 2018-05-28 VITALS — BP 110/66 | HR 103 | Temp 97.7°F | Ht 64.0 in | Wt 216.2 lb

## 2018-05-28 DIAGNOSIS — B001 Herpesviral vesicular dermatitis: Secondary | ICD-10-CM | POA: Diagnosis not present

## 2018-05-28 MED ORDER — VALACYCLOVIR HCL 1 G PO TABS: 2000.0000 mg | ORAL_TABLET | Freq: Two times a day (BID) | ORAL | 5 refills | Status: DC

## 2018-05-28 MED ORDER — ACYCLOVIR 5 % EX OINT: 1.0000 "application " | TOPICAL_OINTMENT | CUTANEOUS | 2 refills | Status: DC

## 2018-05-28 NOTE — Progress Notes (Signed)
Subjective:    Patient ID: Evelyn James, female    DOB: 01/18/1961, 58 y.o.   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  Patient has been dealing with this since 6 March.  Patient states it first.,  Put acyclovir ointment on them and they seem to slowly go away.  Patient went to the beach, left her appointment [place] beach [place] and has not been able to put any iron.  Seems like the lesions have become worse.  Did go to urgent care over the weekend and they gave her for 1000 mg Valtrex tablets.  Lesions around mouth and on chin do seem slightly improved today, but not gone away completely.  Patient states she has had a stressful past few weeks related to having C. difficile, then having a death in the family so she believes this is why her cold sores have flared.  Prior to this flareup of cold sores, has not had an issue in many years.  Patient Active Problem List   Diagnosis Date Noted  . Vaginal lesion 04/30/2018  . Cold sore 04/30/2018  . Constipation 03/28/2018  . Abdominal pain 03/28/2018  . Sinusitis 11/25/2017  . Knee pain 06/19/2017  . Left hip pain 07/12/2015  . Epigastric pain 07/12/2015  . Headache 07/09/2015  . Nausea 02/08/2015  . UTI symptoms 09/18/2014  . Health care maintenance 07/06/2014  . Pelvic pressure in female 07/06/2014  . Dysuria 02/05/2014  . Obesity (BMI 30-39.9) 01/01/2014  . Pain of right side of body 02/26/2013  . Environmental allergies 02/26/2013  . CAD (coronary artery disease) 03/05/2012  . Sleep apnea 03/05/2012  . Hypertension 02/27/2012  . Anemia 02/27/2012  . Hypercholesterolemia 02/27/2012  . Diabetes mellitus (HCC) 02/27/2012   Social History   Tobacco Use  . Smoking status: Never Smoker  . Smokeless tobacco: Never Used  Substance Use Topics  . Alcohol use: No    Alcohol/week: 0.0 standard drinks   Review of Systems  Constitutional: Negative for chills,  fatigue and fever.  HENT: Negative for congestion, ear pain, sinus pain and sore throat.   Eyes: Negative.   Respiratory: Negative for cough, shortness of breath and wheezing.   Cardiovascular: Negative for chest pain, palpitations and leg swelling.  Gastrointestinal: Negative for abdominal pain, diarrhea, nausea and vomiting.  Genitourinary: Negative for dysuria, frequency and urgency.  Musculoskeletal: Negative for arthralgias and myalgias.  Skin: cold sores on lips Neurological: Negative for syncope, light-headedness and headaches.  Psychiatric/Behavioral: The patient is not nervous/anxious.       Objective:   Physical Exam Vitals signs and nursing note reviewed.  Constitutional:      General: She is not in acute distress.    Appearance: She is not toxic-appearing.  HENT:     Head: Normocephalic and atraumatic.      Comments: Scattered lesions on upper and lower lip, and a few on chin.  Do not appear bright red or angry.  Lesions look as if they are in slow process of improving.  There are no open or draining. Eyes:     General: No scleral icterus.    Extraocular Movements: Extraocular movements intact.     Pupils: Pupils are equal, round, and reactive to light.  Neck:     Musculoskeletal: Neck supple. No neck rigidity.  Cardiovascular:     Rate and Rhythm: Regular rhythm.     Heart sounds:  Normal heart sounds.  Pulmonary:     Effort: Pulmonary effort is normal. No respiratory distress.     Breath sounds: Normal breath sounds.  Lymphadenopathy:     Cervical: No cervical adenopathy.  Skin:    General: Skin is warm and dry.  Neurological:     Mental Status: She is alert and oriented to person, place, and time.     Gait: Gait normal.  Psychiatric:        Mood and Affect: Mood normal.        Behavior: Behavior normal.     Vitals:   05/28/18 1101  BP: 110/66  Pulse: (!) 103  Temp: 97.7 F (36.5 C)  SpO2: 95%       Assessment & Plan:   Cold sore- patient will  take 1 more round of Valtrex 2000 mg twice daily for 1 day.  She will apply acyclovir ointment.  Lesions should slowly improve.  Patient aware that if they reoccur, she can use refill of the valacyclovir tablet prescription and begin applying thin layer of ointment.  Advised to dispose of any recently used lip products or facial make-up to prevent reinfecting self.  Patient will keep regularly scheduled follow-up PCP as planned and return to clinic sooner if any issues arise.

## 2018-05-29 ENCOUNTER — Other Ambulatory Visit: Payer: Self-pay | Admitting: Internal Medicine

## 2018-06-13 ENCOUNTER — Encounter: Payer: Self-pay | Admitting: Internal Medicine

## 2018-06-13 ENCOUNTER — Ambulatory Visit (INDEPENDENT_AMBULATORY_CARE_PROVIDER_SITE_OTHER): Payer: BC Managed Care – PPO | Admitting: Internal Medicine

## 2018-06-13 DIAGNOSIS — E119 Type 2 diabetes mellitus without complications: Secondary | ICD-10-CM | POA: Diagnosis not present

## 2018-06-13 DIAGNOSIS — I25118 Atherosclerotic heart disease of native coronary artery with other forms of angina pectoris: Secondary | ICD-10-CM | POA: Diagnosis not present

## 2018-06-13 DIAGNOSIS — E7800 Pure hypercholesterolemia, unspecified: Secondary | ICD-10-CM

## 2018-06-13 DIAGNOSIS — B001 Herpesviral vesicular dermatitis: Secondary | ICD-10-CM

## 2018-06-13 DIAGNOSIS — D649 Anemia, unspecified: Secondary | ICD-10-CM

## 2018-06-13 DIAGNOSIS — I10 Essential (primary) hypertension: Secondary | ICD-10-CM

## 2018-06-13 DIAGNOSIS — Z9109 Other allergy status, other than to drugs and biological substances: Secondary | ICD-10-CM

## 2018-06-13 NOTE — Assessment & Plan Note (Signed)
Follow cbc.  

## 2018-06-13 NOTE — Assessment & Plan Note (Signed)
On pravastatin.  Low cholesterol diet and exercise.  Follow lipid panel and liver function tests.   

## 2018-06-13 NOTE — Assessment & Plan Note (Signed)
Currently asymptomatic.  Continue risk factor modification.  

## 2018-06-13 NOTE — Assessment & Plan Note (Signed)
Sugars as outlined.  Low carb diet and exercise.  Follow met b and a1c.

## 2018-06-13 NOTE — Progress Notes (Signed)
Patient ID: Evelyn James, female   DOB: December 19, 1960, 58 y.o.   MRN: [id number] Virtual Visit via Video Note  I connected with Evelyn James  on 06/13/18 at 11:30 AM EDT by a video enabled telemedicine application and verified that I am speaking with the correct person using two identifiers.  Location patient: home Location provider:work Persons participating in the virtual visit: patient, provider  I discussed the limitations of evaluation and management by telemedicine.  This visit type was conducted due to national recommendations for restrictions regarding the COVID-19 pandemic.  This format is felt to be most appropriate for this patient at this time.   The patient expressed understanding and agreed to proceed.   HPI: Being evaluated for a scheduled follow up.  Recently diagnosed with c.diff.  Treated.  CT scan unrevealing.  Saw GI.  Planning for colonoscopy 07/12/18.  Recommended daily citrucel and miralax.  She is taking these medications.  Bowels are doing better.  No abdominal pain.  Eating.  Occasional nausea just before a bowel movement, but not a significant issue for her now.  Feels a lot better.  Breathing stable.  Some allergy symptoms, but controlling with otc medication.  No chest pain.  States sugars are averaging 110-120s.  Discussed diet and exercise.  Handling stress.  On lexapro and feels she is doing well on this medication.     ROS: See pertinent positives and negatives per HPI.  Past Medical History:  Diagnosis Date  . Anemia    Noted 11/2011  . Anxiety   . Chest pain    Admitted 11/2011: cath with mild nonobstructive coronary plaque, normal EF, negative cardiac enzymes and negative d-dimer.  . Diabetes mellitus   . Fatty liver disease, nonalcoholic 9292  . GERD (gastroesophageal reflux disease)   . Hypercholesterolemia   . Hypertension   . Reflux   . TMJ dysfunction     Past Surgical History:  Procedure Laterality Date  . ABDOMINAL HYSTERECTOMY    . BILATERAL  SALPINGOOPHORECTOMY     benign ovarian tumor  . IMAGE GUIDED SINUS SURGERY N/A 10/22/2015   Procedure: IMAGE GUIDED SINUS SURGERY;  Surgeon: Carloyn Manner, MD;  Location: [facility] ORS;  Service: ENT;  Laterality: N/A;  . KNEE SURGERY    . LEFT HEART CATHETERIZATION WITH CORONARY ANGIOGRAM N/A 12/09/2011   Procedure: LEFT HEART CATHETERIZATION WITH CORONARY ANGIOGRAM;  Surgeon: Minus Breeding, MD;  Location: [facility] CATH LAB;  Service: Cardiovascular;  Laterality: N/A;  . SPHENOIDECTOMY Left 10/22/2015   Procedure: SPHENOIDECTOMY;  Surgeon: Carloyn Manner, MD;  Location: [facility] ORS;  Service: ENT;  Laterality: Left;  . SUBMANDIBULAR MASS EXCISION      Family History  Problem Relation Age of Onset  . Heart disease Father        Father had CABG in his 47s  . Colon polyps Father   . Lung cancer Mother   . Breast cancer Paternal Grandmother   . Arthritis/Rheumatoid Paternal Grandfather     SOCIAL HX: reviewed.     Current Outpatient Medications:  .  acetaminophen (TYLENOL) 500 MG tablet, Take 1,000 mg by mouth every 6 (six) hours as needed for mild pain., Disp: , Rfl:  .  acyclovir ointment (ZOVIRAX) 5 %, Apply 1 application topically every 6 (six) hours as needed., Disp: 15 g, Rfl: 0 .  acyclovir ointment (ZOVIRAX) 5 %, Apply 1 application topically every 3 (three) hours., Disp: 5 g, Rfl: 2 .  Blood Glucose Monitoring Suppl (ONE TOUCH ULTRA 2) w/Device  KIT, U D TO CHECK BLOOD SUGARS, Disp: , Rfl: 0 .  cetirizine (ZYRTEC) 10 MG tablet, Take 10 mg by mouth daily., Disp: , Rfl:  .  cyclobenzaprine (FLEXERIL) 10 MG tablet, Take 0.5-1 tablets (5-10 mg total) by mouth at bedtime as needed for muscle spasms., Disp: 30 tablet, Rfl: 0 .  escitalopram (LEXAPRO) 20 MG tablet, TAKE 1 TABLET BY MOUTH EVERY DAY, Disp: 90 tablet, Rfl: 1 .  esomeprazole (NEXIUM) 20 MG capsule, Take 20 mg by mouth daily at 12 noon., Disp: , Rfl:  .  FARXIGA 10 MG TABS tablet, TAKE 1 TABLET BY MOUTH EVERY MORNING, Disp: 90  tablet, Rfl: 1 .  Fe Fum-FePoly-Vit C-Vit B3 (INTEGRA) 62.5-62.5-40-3 MG CAPS, TAKE 1 CAPSULE BY MOUTH TWICE DAILY, Disp: 180 capsule, Rfl: 1 .  fluconazole (DIFLUCAN) 150 MG tablet, Take one tablet x 1.  If persistent symptoms after three days may repeat x 1., Disp: 2 tablet, Rfl: 0 .  fluticasone (FLONASE) 50 MCG/ACT nasal spray, Place 2 sprays into both nostrils daily., Disp: 16 g, Rfl: 5 .  glucose blood test strip, three times before mills., Disp: 100 each, Rfl: 12 .  lisinopril (PRINIVIL,ZESTRIL) 20 MG tablet, TAKE 1 TABLET BY MOUTH ONCE DAILY, Disp: 90 tablet, Rfl: 1 .  Melatonin 10 MG TABS, Take 10 mg by mouth at bedtime as needed (sleep)., Disp: , Rfl:  .  meloxicam (MOBIC) 15 MG tablet, Take 15 mg by mouth daily., Disp: , Rfl: 3 .  metFORMIN (GLUCOPHAGE) 1000 MG tablet, TAKE ONE TABLET BY MOUTH TWICE A DAY WITH MEALS, Disp: 180 tablet, Rfl: 1 .  mupirocin ointment (BACTROBAN) 2 %, Place 1 application into the nose 2 (two) times daily., Disp: 22 g, Rfl: 0 .  pravastatin (PRAVACHOL) 40 MG tablet, TAKE 1 TABLET(40 MG) BY MOUTH DAILY, Disp: 90 tablet, Rfl: 0 .  PREMARIN 0.45 MG tablet, TAKE 1 TABLET BY MOUTH EVERY DAY, Disp: 90 tablet, Rfl: 0 .  Probiotic Product (PHILLIPS COLON HEALTH PO), Take 1 tablet by mouth daily as needed (colon health). , Disp: , Rfl:  .  TRULICITY 1.5 HU/7.6LY SOPN, inject 1.5 milligrams subcutaneously every 7 days, Disp: , Rfl: 0 .  valACYclovir (VALTREX) 1000 MG tablet, Take 2 tablets (2,000 mg total) by mouth 2 (two) times daily., Disp: 4 tablet, Rfl: 5  EXAM:  GENERAL: alert, oriented, appears well and in no acute distress  HEENT: atraumatic, conjunttiva clear, no obvious abnormalities on inspection of external nose and ears  LUNGS: on inspection no signs of respiratory distress, breathing rate appears normal, no obvious gross SOB, gasping or wheezing  CV: no obvious cyanosis  PSYCH/NEURO: pleasant and cooperative, no obvious depression or anxiety, speech  and thought processing grossly intact  ASSESSMENT AND PLAN:  Discussed the following assessment and plan:  Anemia, unspecified type  Coronary artery disease involving native coronary artery of native heart with other form of angina pectoris (HCC)  Cold sore  Type 2 diabetes mellitus without complication, without long-term current use of insulin (HCC)  Environmental allergies  Hypercholesterolemia  Essential hypertension     I discussed the assessment and treatment plan with the patient. The patient was provided an opportunity to ask questions and all were answered. The patient agreed with the plan and demonstrated an understanding of the instructions.   The patient was advised to call back or seek an in-person evaluation if the symptoms worsen or if the condition fails to improve as anticipated.  I provided 15 minutes  of non-face-to-face time during this encounter.   Einar Pheasant, MD

## 2018-06-13 NOTE — Assessment & Plan Note (Signed)
Blood pressure under good control.  Continue same medication regimen.  Follow pressures.  Follow metabolic panel.   

## 2018-06-13 NOTE — Assessment & Plan Note (Signed)
Controlled on current regimen.   

## 2018-06-13 NOTE — Assessment & Plan Note (Addendum)
Treated recently with valtrex.  On exam today - lips clear - no lesions visualized.

## 2018-06-16 ENCOUNTER — Other Ambulatory Visit: Payer: Self-pay | Admitting: Internal Medicine

## 2018-06-18 MED ORDER — PRAVASTATIN SODIUM 40 MG PO TABS: 40.0000 mg | ORAL_TABLET | Freq: Every day | ORAL | 0 refills | Status: DC

## 2018-06-18 MED ORDER — ESTROGENS CONJUGATED 0.45 MG PO TABS: 0.4500 mg | ORAL_TABLET | Freq: Every day | ORAL | 0 refills | Status: DC

## 2018-06-25 ENCOUNTER — Other Ambulatory Visit: Payer: Self-pay | Admitting: Internal Medicine

## 2018-07-07 ENCOUNTER — Encounter: Payer: Self-pay | Admitting: Internal Medicine

## 2018-07-10 ENCOUNTER — Ambulatory Visit (INDEPENDENT_AMBULATORY_CARE_PROVIDER_SITE_OTHER): Payer: BC Managed Care – PPO | Admitting: Family Medicine

## 2018-07-10 ENCOUNTER — Encounter: Payer: Self-pay | Admitting: Internal Medicine

## 2018-07-10 ENCOUNTER — Other Ambulatory Visit: Payer: Self-pay

## 2018-07-10 DIAGNOSIS — M545 Low back pain, unspecified: Secondary | ICD-10-CM

## 2018-07-10 DIAGNOSIS — B029 Zoster without complications: Secondary | ICD-10-CM

## 2018-07-10 MED ORDER — GABAPENTIN 300 MG PO CAPS: 300.0000 mg | ORAL_CAPSULE | Freq: Every day | ORAL | 1 refills | Status: DC

## 2018-07-10 MED ORDER — VALACYCLOVIR HCL 1 G PO TABS: 1000.0000 mg | ORAL_TABLET | Freq: Two times a day (BID) | ORAL | 0 refills | Status: DC

## 2018-07-10 NOTE — Progress Notes (Signed)
Patient ID: Evelyn James, female   DOB: [birth year], 58 y.o.   MRN: [id number]  Virtual Visit via video Note  This visit type was conducted due to national recommendations for restrictions regarding the COVID-19 pandemic (e.g. social distancing).  This format is felt to be most appropriate for this patient at this time.  All issues noted in this document were discussed and addressed.  No physical exam was performed (except for noted visual exam findings with Video Visits).   I connected with [name] on 07/10/18 at  1:00 PM EDT by a video enabled telemedicine application or telephone and verified that I am speaking with the correct person using two identifiers. Location patient: home Location provider: [place] Persons participating in the virtual visit: patient, provider  I discussed the limitations, risks, security and privacy concerns of performing an evaluation and management service by video and phone and the availability of in person appointments. I also discussed with the patient that there may be a patient responsible charge related to this service. The patient expressed understanding and agreed to proceed.  Interactive audio and video telecommunications were attempted between this provider and patient, however failed, the video connected at first, then the video feed froze. We stopped the video and finished visit with audio only.   HPI:  Patient and I connected via video at first, then proceeded to finish visit over phone due to our video feed freezing.  She is having some right sided back pain that is wrapping around somewhat to the front.  It is a burning and tingling type pain.  Wonders if it could be an early onset of shingles.  Does not have any noticeable rash.  Denies any injury or twisting that could of hurt her back.  Patient states that she has tried taking Flexeril to see if this would help pain improve, but the only thing Flexeril did was make her sleepy. States it  is hard to get comfortable to sleep at night due to pain on right side of back.   Denies numbness or tingling in extremities.  Denies saddle anesthesia.  Denies loss of bowel or bladder control.  Denies issues gripping with her hands.  Denies fever or chills.  Denies cough, shortness of breath or wheezing.  Denies GI or GU issues.  ROS: See pertinent positives and negatives per HPI.  Past Medical History:  Diagnosis Date  . Anemia    Noted 11/2011  . Anxiety   . Chest pain    Admitted 11/2011: cath with mild nonobstructive coronary plaque, normal EF, negative cardiac enzymes and negative d-dimer.  . Diabetes mellitus   . Fatty liver disease, nonalcoholic 8016  . GERD (gastroesophageal reflux disease)   . Hypercholesterolemia   . Hypertension   . Reflux   . TMJ dysfunction     Past Surgical History:  Procedure Laterality Date  . ABDOMINAL HYSTERECTOMY    . BILATERAL SALPINGOOPHORECTOMY     benign ovarian tumor  . IMAGE GUIDED SINUS SURGERY N/A 10/22/2015   Procedure: IMAGE GUIDED SINUS SURGERY;  Surgeon: Carloyn Manner, MD;  Location: [facility] ORS;  Service: ENT;  Laterality: N/A;  . KNEE SURGERY    . LEFT HEART CATHETERIZATION WITH CORONARY ANGIOGRAM N/A 12/09/2011   Procedure: LEFT HEART CATHETERIZATION WITH CORONARY ANGIOGRAM;  Surgeon: Minus Breeding, MD;  Location: [facility] CATH LAB;  Service: Cardiovascular;  Laterality: N/A;  . SPHENOIDECTOMY Left 10/22/2015   Procedure: SPHENOIDECTOMY;  Surgeon: Carloyn Manner, MD;  Location: [facility] ORS;  Service: ENT;  Laterality: Left;  . SUBMANDIBULAR MASS EXCISION      Family History  Problem Relation Age of Onset  . Heart disease Father        Father had CABG in his 10s  . Colon polyps Father   . Lung cancer Mother   . Breast cancer Paternal Grandmother   . Arthritis/Rheumatoid Paternal Grandfather      Current Outpatient Medications:  .  acetaminophen (TYLENOL) 500 MG tablet, Take 1,000 mg by mouth every 6 (six) hours as needed for  mild pain., Disp: , Rfl:  .  acyclovir ointment (ZOVIRAX) 5 %, Apply 1 application topically every 6 (six) hours as needed., Disp: 15 g, Rfl: 0 .  acyclovir ointment (ZOVIRAX) 5 %, Apply 1 application topically every 3 (three) hours., Disp: 5 g, Rfl: 2 .  Blood Glucose Monitoring Suppl (ONE TOUCH ULTRA 2) w/Device KIT, U D TO CHECK BLOOD SUGARS, Disp: , Rfl: 0 .  cetirizine (ZYRTEC) 10 MG tablet, Take 10 mg by mouth daily., Disp: , Rfl:  .  cyclobenzaprine (FLEXERIL) 10 MG tablet, Take 0.5-1 tablets (5-10 mg total) by mouth at bedtime as needed for muscle spasms., Disp: 30 tablet, Rfl: 0 .  escitalopram (LEXAPRO) 20 MG tablet, TAKE 1 TABLET BY MOUTH EVERY DAY, Disp: 90 tablet, Rfl: 1 .  esomeprazole (NEXIUM) 20 MG capsule, Take 20 mg by mouth daily at 12 noon., Disp: , Rfl:  .  estrogens, conjugated, (PREMARIN) 0.45 MG tablet, Take 1 tablet (0.45 mg total) by mouth daily. Take daily for 21 days then do not take for 7 days., Disp: 90 tablet, Rfl: 0 .  FARXIGA 10 MG TABS tablet, TAKE 1 TABLET BY MOUTH EVERY MORNING, Disp: 90 tablet, Rfl: 1 .  Fe Fum-FePoly-Vit C-Vit B3 (INTEGRA) 62.5-62.5-40-3 MG CAPS, TAKE 1 CAPSULE BY MOUTH TWICE DAILY, Disp: 180 capsule, Rfl: 1 .  fluconazole (DIFLUCAN) 150 MG tablet, Take one tablet x 1.  If persistent symptoms after three days may repeat x 1., Disp: 2 tablet, Rfl: 0 .  fluticasone (FLONASE) 50 MCG/ACT nasal spray, Place 2 sprays into both nostrils daily., Disp: 16 g, Rfl: 5 .  glucose blood test strip, three times before mills., Disp: 100 each, Rfl: 12 .  lisinopril (PRINIVIL,ZESTRIL) 20 MG tablet, TAKE 1 TABLET BY MOUTH ONCE DAILY, Disp: 90 tablet, Rfl: 1 .  Melatonin 10 MG TABS, Take 10 mg by mouth at bedtime as needed (sleep)., Disp: , Rfl:  .  meloxicam (MOBIC) 15 MG tablet, Take 15 mg by mouth daily., Disp: , Rfl: 3 .  metFORMIN (GLUCOPHAGE) 1000 MG tablet, TAKE ONE TABLET BY MOUTH TWICE A DAY WITH MEALS, Disp: 180 tablet, Rfl: 1 .  mupirocin ointment  (BACTROBAN) 2 %, Place 1 application into the nose 2 (two) times daily., Disp: 22 g, Rfl: 0 .  pravastatin (PRAVACHOL) 40 MG tablet, Take 1 tablet (40 mg total) by mouth daily., Disp: 90 tablet, Rfl: 0 .  Probiotic Product (PHILLIPS COLON HEALTH PO), Take 1 tablet by mouth daily as needed (colon health). , Disp: , Rfl:  .  TRULICITY 1.5 GY/5.6LS SOPN, inject 1.5 milligrams subcutaneously every 7 days, Disp: , Rfl: 0 .  valACYclovir (VALTREX) 1000 MG tablet, Take 2 tablets (2,000 mg total) by mouth 2 (two) times daily., Disp: 4 tablet, Rfl: 5  EXAM:  GENERAL: alert, oriented, appears well and in no acute distress  HEENT: atraumatic, conjunttiva clear, no obvious abnormalities on inspection of external nose and ears  NECK: normal movements of the head and neck  LUNGS: on inspection no signs of respiratory distress, breathing rate appears normal, no obvious gross SOB, gasping or wheezing  CV: no obvious cyanosis  MS: moves all visible extremities without noticeable abnormality  PSYCH/NEURO: pleasant and cooperative, no obvious depression or anxiety, speech and thought processing grossly intact  ASSESSMENT AND PLAN:  Discussed the following assessment and plan:  Herpes zoster without complication - Plan: valACYclovir (VALTREX) 1000 MG tablet, gabapentin (NEURONTIN) 300 MG capsule  Acute right-sided low back pain without sciatica - Plan: valACYclovir (VALTREX) 1000 MG tablet, gabapentin (NEURONTIN) 300 MG capsule  Patient symptoms could potentially be a early onset shingles.  Patient will take Valtrex 3 times daily for 7 days to treat shingles.  Patient has taken Valtrex previously for cold sore outbreaks and has tolerated this medication without any problems.  We will also do gabapentin 300 mg prior to bedtime to calm down nervy type pain which will help treat shingles pain and also will treat a musculoskeletal type back pain if that potentially could be the cause of her pain as  well.  Patient will let us know by the end of the week if she is not improving.   I discussed the assessment and treatment plan with the patient. The patient was provided an opportunity to ask questions and all were answered. The patient agreed with the plan and demonstrated an understanding of the instructions.   The patient was advised to call back or seek an in-person evaluation if the symptoms worsen or if the condition fails to improve as anticipated.   Jodelle Green, FNP

## 2018-07-10 NOTE — Telephone Encounter (Signed)
See other message

## 2018-07-10 NOTE — Telephone Encounter (Signed)
Pt scheduled with Leotis Shames today

## 2018-07-12 ENCOUNTER — Encounter: Payer: Self-pay | Admitting: Family Medicine

## 2018-07-12 ENCOUNTER — Encounter: Admission: RE | Payer: Self-pay | Source: Home / Self Care

## 2018-07-12 ENCOUNTER — Ambulatory Visit
Admission: RE | Admit: 2018-07-12 | Payer: BC Managed Care – PPO | Source: Home / Self Care | Admitting: Gastroenterology

## 2018-07-12 SURGERY — COLONOSCOPY WITH PROPOFOL
Anesthesia: General

## 2018-08-08 ENCOUNTER — Encounter: Payer: Self-pay | Admitting: Internal Medicine

## 2018-09-24 ENCOUNTER — Other Ambulatory Visit: Payer: Self-pay | Admitting: Internal Medicine

## 2018-09-24 MED ORDER — PRAVASTATIN SODIUM 40 MG PO TABS: 40.0000 mg | ORAL_TABLET | Freq: Every day | ORAL | 1 refills | Status: DC

## 2018-09-24 MED ORDER — ESTROGENS CONJUGATED 0.45 MG PO TABS: 0.4500 mg | ORAL_TABLET | Freq: Every day | ORAL | 1 refills | Status: DC

## 2018-10-16 ENCOUNTER — Other Ambulatory Visit: Payer: Self-pay

## 2018-10-16 MED ORDER — METFORMIN HCL 1000 MG PO TABS: 1000.0000 mg | ORAL_TABLET | Freq: Two times a day (BID) | ORAL | 1 refills | Status: DC

## 2018-10-17 ENCOUNTER — Other Ambulatory Visit: Payer: Self-pay | Admitting: Internal Medicine

## 2018-10-17 DIAGNOSIS — Z1231 Encounter for screening mammogram for malignant neoplasm of breast: Secondary | ICD-10-CM

## 2018-11-01 ENCOUNTER — Other Ambulatory Visit: Payer: Self-pay | Admitting: Internal Medicine

## 2018-11-01 ENCOUNTER — Other Ambulatory Visit: Payer: Self-pay

## 2018-11-01 ENCOUNTER — Ambulatory Visit
Admission: RE | Admit: 2018-11-01 | Discharge: 2018-11-01 | Disposition: A | Discharge status: Home / Self Care | Payer: BC Managed Care – PPO | Source: Ambulatory Visit | Attending: Internal Medicine | Admitting: Internal Medicine

## 2018-11-01 DIAGNOSIS — R928 Other abnormal and inconclusive findings on diagnostic imaging of breast: Secondary | ICD-10-CM

## 2018-11-01 DIAGNOSIS — Z1231 Encounter for screening mammogram for malignant neoplasm of breast: Secondary | ICD-10-CM | POA: Diagnosis present

## 2018-11-01 DIAGNOSIS — N6489 Other specified disorders of breast: Secondary | ICD-10-CM

## 2018-11-05 ENCOUNTER — Telehealth: Payer: Self-pay | Admitting: *Deleted

## 2018-11-05 DIAGNOSIS — I10 Essential (primary) hypertension: Secondary | ICD-10-CM

## 2018-11-05 DIAGNOSIS — D649 Anemia, unspecified: Secondary | ICD-10-CM

## 2018-11-05 DIAGNOSIS — E7800 Pure hypercholesterolemia, unspecified: Secondary | ICD-10-CM

## 2018-11-05 DIAGNOSIS — E119 Type 2 diabetes mellitus without complications: Secondary | ICD-10-CM

## 2018-11-05 NOTE — Telephone Encounter (Signed)
Please place future orders for lab appt.  

## 2018-11-06 NOTE — Telephone Encounter (Signed)
Orders placed for f/u labs.  

## 2018-11-07 ENCOUNTER — Other Ambulatory Visit: Payer: Self-pay

## 2018-11-07 ENCOUNTER — Other Ambulatory Visit (INDEPENDENT_AMBULATORY_CARE_PROVIDER_SITE_OTHER): Payer: BC Managed Care – PPO

## 2018-11-07 DIAGNOSIS — D649 Anemia, unspecified: Secondary | ICD-10-CM

## 2018-11-07 DIAGNOSIS — I10 Essential (primary) hypertension: Secondary | ICD-10-CM | POA: Diagnosis not present

## 2018-11-07 DIAGNOSIS — E119 Type 2 diabetes mellitus without complications: Secondary | ICD-10-CM

## 2018-11-07 DIAGNOSIS — E7800 Pure hypercholesterolemia, unspecified: Secondary | ICD-10-CM | POA: Diagnosis not present

## 2018-11-07 LAB — FERRITIN: Ferritin: 32.5 ng/mL (ref 10.0–291.0)

## 2018-11-07 LAB — HEPATIC FUNCTION PANEL
ALT: 13 U/L (ref 0–35)
AST: 13 U/L (ref 0–37)
Albumin: 4 g/dL (ref 3.5–5.2)
Alkaline Phosphatase: 56 U/L (ref 39–117)
Bilirubin, Direct: 0 mg/dL (ref 0.0–0.3)
Total Bilirubin: 0.2 mg/dL (ref 0.2–1.2)
Total Protein: 6.6 g/dL (ref 6.0–8.3)

## 2018-11-07 LAB — LIPID PANEL
Cholesterol: 180 mg/dL (ref 0–200)
HDL: 57.6 mg/dL (ref >39.00)
NonHDL: 122.56
Total CHOL/HDL Ratio: 3
Triglycerides: 258 mg/dL — ABNORMAL HIGH (ref 0.0–149.0)
VLDL: 51.6 mg/dL — ABNORMAL HIGH (ref 0.0–40.0)

## 2018-11-07 LAB — CBC WITH DIFFERENTIAL/PLATELET
Basophils Absolute: 0.1 10*3/uL (ref 0.0–0.1)
Basophils Relative: 0.6 % (ref 0.0–3.0)
Eosinophils Absolute: 0.1 10*3/uL (ref 0.0–0.7)
Eosinophils Relative: 1.5 % (ref 0.0–5.0)
HCT: 39.3 % (ref 36.0–46.0)
Hemoglobin: 12.6 g/dL (ref 12.0–15.0)
Lymphocytes Relative: 29.6 % (ref 12.0–46.0)
Lymphs Abs: 2.7 10*3/uL (ref 0.7–4.0)
MCHC: 32 g/dL (ref 30.0–36.0)
MCV: 87.8 fl (ref 78.0–100.0)
Monocytes Absolute: 0.5 10*3/uL (ref 0.1–1.0)
Monocytes Relative: 5.7 % (ref 3.0–12.0)
Neutro Abs: 5.8 10*3/uL (ref 1.4–7.7)
Neutrophils Relative %: 62.6 % (ref 43.0–77.0)
Platelets: 199 10*3/uL (ref 150.0–400.0)
RBC: 4.48 Mil/uL (ref 3.87–5.11)
RDW: 13.9 % (ref 11.5–15.5)
WBC: 9.3 10*3/uL (ref 4.0–10.5)

## 2018-11-07 LAB — BASIC METABOLIC PANEL
BUN: 14 mg/dL (ref 6–23)
CO2: 30 mEq/L (ref 19–32)
Calcium: 9.1 mg/dL (ref 8.4–10.5)
Chloride: 100 mEq/L (ref 96–112)
Creatinine, Ser: 0.6 mg/dL (ref 0.40–1.20)
GFR: 102.08 mL/min (ref >60.00)
Glucose, Bld: 92 mg/dL (ref 70–99)
Potassium: 4 mEq/L (ref 3.5–5.1)
Sodium: 139 mEq/L (ref 135–145)

## 2018-11-07 LAB — HEMOGLOBIN A1C: Hgb A1c MFr Bld: 6.5 % (ref 4.6–6.5)

## 2018-11-07 LAB — LDL CHOLESTEROL, DIRECT: Direct LDL: 93 mg/dL

## 2018-11-08 ENCOUNTER — Encounter: Payer: Self-pay | Admitting: Internal Medicine

## 2018-11-09 ENCOUNTER — Ambulatory Visit
Admission: RE | Admit: 2018-11-09 | Discharge: 2018-11-09 | Disposition: A | Discharge status: Home / Self Care | Payer: BC Managed Care – PPO | Source: Ambulatory Visit | Attending: Internal Medicine | Admitting: Internal Medicine

## 2018-11-09 DIAGNOSIS — R928 Other abnormal and inconclusive findings on diagnostic imaging of breast: Secondary | ICD-10-CM

## 2018-11-09 DIAGNOSIS — N6489 Other specified disorders of breast: Secondary | ICD-10-CM

## 2018-11-13 ENCOUNTER — Encounter: Payer: Self-pay | Admitting: Internal Medicine

## 2018-11-13 ENCOUNTER — Other Ambulatory Visit: Payer: Self-pay

## 2018-11-13 ENCOUNTER — Ambulatory Visit (INDEPENDENT_AMBULATORY_CARE_PROVIDER_SITE_OTHER): Payer: BC Managed Care – PPO | Admitting: Internal Medicine

## 2018-11-13 VITALS — BP 108/78 | HR 80 | Temp 95.1°F | Resp 16 | Ht 64.0 in | Wt 199.6 lb

## 2018-11-13 DIAGNOSIS — D649 Anemia, unspecified: Secondary | ICD-10-CM

## 2018-11-13 DIAGNOSIS — I25118 Atherosclerotic heart disease of native coronary artery with other forms of angina pectoris: Secondary | ICD-10-CM

## 2018-11-13 DIAGNOSIS — Z23 Encounter for immunization: Secondary | ICD-10-CM

## 2018-11-13 DIAGNOSIS — Z0000 Encounter for general adult medical examination without abnormal findings: Secondary | ICD-10-CM

## 2018-11-13 DIAGNOSIS — E119 Type 2 diabetes mellitus without complications: Secondary | ICD-10-CM

## 2018-11-13 DIAGNOSIS — E7800 Pure hypercholesterolemia, unspecified: Secondary | ICD-10-CM

## 2018-11-13 DIAGNOSIS — I10 Essential (primary) hypertension: Secondary | ICD-10-CM

## 2018-11-13 DIAGNOSIS — R928 Other abnormal and inconclusive findings on diagnostic imaging of breast: Secondary | ICD-10-CM

## 2018-11-13 DIAGNOSIS — G4730 Sleep apnea, unspecified: Secondary | ICD-10-CM

## 2018-11-13 LAB — HM DIABETES FOOT EXAM

## 2018-11-13 MED ORDER — CYCLOBENZAPRINE HCL 10 MG PO TABS: 5.0000 mg | ORAL_TABLET | Freq: Every evening | ORAL | 0 refills | Status: DC | PRN

## 2018-11-13 NOTE — Progress Notes (Signed)
Patient ID: Evelyn James, female   DOB: 1960-04-29, 58 y.o.   MRN: [id number]   Subjective:    Patient ID: Evelyn James, female    DOB: Mar 06, 1961, 57 y.o.   MRN: [id number]  HPI  Patient here for her physical exam.  She reports she is doing well.  Has lost weight.  Has adjusted her portion sizes.  Trying to watch her diet.  Trying to stay active.  No chest pain.  No sob.  No acid reflux.  No abdominal pain.  Bowels moving.  Taking citrucel and miralax.  Saw GI 06/12/18.  Recommended f/u colonoscopy.  Saw ortho 08/21/18 for right knee pain.  S/p cortisone injection.  Taking lexapro.  Handling stress.  Occasionally has low back pain and pain in buttocks.  Takes flexeril prn.  Feels is stable.  Recent mammogram with f/u left breast mammogram and ultrasound -  Revealed possible complicated cyst.  They recommended f/u left breast mammogram in 6 months.  Discussed with her today.  Discussed having surgery evaluate to confirm no further intervention warranted.     Past Medical History:  Diagnosis Date   Anemia    Noted 11/2011   Anxiety    Chest pain    Admitted 11/2011: cath with mild nonobstructive coronary plaque, normal EF, negative cardiac enzymes and negative d-dimer.   Diabetes mellitus    Fatty liver disease, nonalcoholic 7829   GERD (gastroesophageal reflux disease)    Hypercholesterolemia    Hypertension    Reflux    TMJ dysfunction    Past Surgical History:  Procedure Laterality Date   ABDOMINAL HYSTERECTOMY     BILATERAL SALPINGOOPHORECTOMY     benign ovarian tumor   IMAGE GUIDED SINUS SURGERY N/A 10/22/2015   Procedure: IMAGE GUIDED SINUS SURGERY;  Surgeon: Carloyn Manner, MD;  Location: [facility] ORS;  Service: ENT;  Laterality: N/A;   KNEE SURGERY     LEFT HEART CATHETERIZATION WITH CORONARY ANGIOGRAM N/A 12/09/2011   Procedure: LEFT HEART CATHETERIZATION WITH CORONARY ANGIOGRAM;  Surgeon: Minus Breeding, MD;  Location: [facility] CATH LAB;  Service: Cardiovascular;   Laterality: N/A;   SPHENOIDECTOMY Left 10/22/2015   Procedure: SPHENOIDECTOMY;  Surgeon: Carloyn Manner, MD;  Location: [facility] ORS;  Service: ENT;  Laterality: Left;   SUBMANDIBULAR MASS EXCISION     Family History  Problem Relation Age of Onset   Heart disease Father        Father had CABG in his 65s   Colon polyps Father    Lung cancer Mother    Breast cancer Paternal Grandmother    Arthritis/Rheumatoid Paternal Grandfather    Breast cancer Paternal Aunt    Social History   Socioeconomic History   Marital status: Divorced    Spouse name: Not on file   Number of children: 2   Years of education: Not on file   Highest education level: Not on file  Occupational History   Not on file  Social Needs   Financial resource strain: Not on file   Food insecurity    Worry: Not on file    Inability: Not on file   Transportation needs    Medical: Not on file    Non-medical: Not on file  Tobacco Use   Smoking status: Never Smoker   Smokeless tobacco: Never Used  Substance and Sexual Activity   Alcohol use: No    Alcohol/week: 0.0 standard drinks   Drug use: No   Sexual activity: Not on file  Lifestyle  Physical activity    Days per week: Not on file    Minutes per session: Not on file   Stress: Not on file  Relationships   Social connections    Talks on phone: Not on file    Gets together: Not on file    Attends religious service: Not on file    Active member of club or organization: Not on file    Attends meetings of clubs or organizations: Not on file    Relationship status: Not on file  Other Topics Concern   Not on file  Social History Narrative   Not on file    Outpatient Encounter Medications as of 11/13/2018  Medication Sig   acetaminophen (TYLENOL) 500 MG tablet Take 1,000 mg by mouth every 6 (six) hours as needed for mild pain.   acyclovir ointment (ZOVIRAX) 5 % Apply 1 application topically every 3 (three) hours.   Blood Glucose  Monitoring Suppl (ONE TOUCH ULTRA 2) w/Device KIT U D TO CHECK BLOOD SUGARS   cetirizine (ZYRTEC) 10 MG tablet Take 10 mg by mouth daily.   cyclobenzaprine (FLEXERIL) 10 MG tablet Take 0.5-1 tablets (5-10 mg total) by mouth at bedtime as needed for muscle spasms.   escitalopram (LEXAPRO) 20 MG tablet TAKE 1 TABLET BY MOUTH EVERY DAY   esomeprazole (NEXIUM) 20 MG capsule Take 20 mg by mouth daily at 12 noon.   estrogens, conjugated, (PREMARIN) 0.45 MG tablet Take 1 tablet (0.45 mg total) by mouth daily. Take daily for 21 days then do not take for 7 days.   FARXIGA 10 MG TABS tablet TAKE 1 TABLET BY MOUTH EVERY MORNING   Fe Fum-FePoly-Vit C-Vit B3 (INTEGRA) 62.5-62.5-40-3 MG CAPS TAKE 1 CAPSULE BY MOUTH TWICE DAILY   fluconazole (DIFLUCAN) 150 MG tablet Take one tablet x 1.  If persistent symptoms after three days may repeat x 1.   fluticasone (FLONASE) 50 MCG/ACT nasal spray Place 2 sprays into both nostrils daily.   gabapentin (NEURONTIN) 300 MG capsule Take 1 capsule (300 mg total) by mouth at bedtime.   glucose blood test strip three times before mills.   lisinopril (PRINIVIL,ZESTRIL) 20 MG tablet TAKE 1 TABLET BY MOUTH ONCE DAILY   Melatonin 10 MG TABS Take 10 mg by mouth at bedtime as needed (sleep).   meloxicam (MOBIC) 15 MG tablet Take 15 mg by mouth daily.   metFORMIN (GLUCOPHAGE) 1000 MG tablet Take 1 tablet (1,000 mg total) by mouth 2 (two) times daily with a meal.   mupirocin ointment (BACTROBAN) 2 % Place 1 application into the nose 2 (two) times daily.   pravastatin (PRAVACHOL) 40 MG tablet Take 1 tablet (40 mg total) by mouth daily.   Probiotic Product (PHILLIPS COLON HEALTH PO) Take 1 tablet by mouth daily as needed (colon health).    TRULICITY 1.5 BD/5.3GD SOPN inject 1.5 milligrams subcutaneously every 7 days   valACYclovir (VALTREX) 1000 MG tablet Take 1 tablet (1,000 mg total) by mouth 2 (two) times daily.   [DISCONTINUED] acyclovir ointment (ZOVIRAX) 5 %  Apply 1 application topically every 6 (six) hours as needed.   [DISCONTINUED] cyclobenzaprine (FLEXERIL) 10 MG tablet Take 0.5-1 tablets (5-10 mg total) by mouth at bedtime as needed for muscle spasms.   [DISCONTINUED] valACYclovir (VALTREX) 1000 MG tablet Take 2 tablets (2,000 mg total) by mouth 2 (two) times daily.   No facility-administered encounter medications on file as of 11/13/2018.     Review of Systems  Constitutional: Negative for appetite change  and unexpected weight change.  HENT: Negative for congestion and sinus pressure.   Eyes: Negative for pain and visual disturbance.  Respiratory: Negative for cough, chest tightness and shortness of breath.   Cardiovascular: Negative for chest pain, palpitations and leg swelling.  Gastrointestinal: Negative for abdominal pain, diarrhea, nausea and vomiting.  Genitourinary: Negative for difficulty urinating and dysuria.  Musculoskeletal: Negative for joint swelling and myalgias.  Skin: Negative for color change and rash.  Neurological: Negative for dizziness, light-headedness and headaches.  Hematological: Negative for adenopathy. Does not bruise/bleed easily.  Psychiatric/Behavioral: Negative for agitation and dysphoric mood.       Objective:    Physical Exam Constitutional:      General: She is not in acute distress.    Appearance: Normal appearance. She is well-developed.  HENT:     Right Ear: External ear normal.     Left Ear: External ear normal.  Eyes:     General: No scleral icterus.       Right eye: No discharge.        Left eye: No discharge.     Conjunctiva/sclera: Conjunctivae normal.  Neck:     Musculoskeletal: Neck supple. No muscular tenderness.     Thyroid: No thyromegaly.  Cardiovascular:     Rate and Rhythm: Normal rate and regular rhythm.  Pulmonary:     Effort: No tachypnea, accessory muscle usage or respiratory distress.     Breath sounds: Normal breath sounds. No decreased breath sounds or wheezing.    Chest:     Breasts:        Right: No inverted nipple, mass, nipple discharge or tenderness (no axillary adenopathy).        Left: No inverted nipple, mass, nipple discharge or tenderness (no axilarry adenopathy).  Abdominal:     General: Bowel sounds are normal.     Palpations: Abdomen is soft.     Tenderness: There is no abdominal tenderness.  Musculoskeletal:        General: No swelling or tenderness.  Lymphadenopathy:     Cervical: No cervical adenopathy.  Skin:    Findings: No erythema or rash.  Neurological:     Mental Status: She is alert and oriented to person, place, and time.  Psychiatric:        Mood and Affect: Mood normal.        Behavior: Behavior normal.     BP 108/78    Pulse 80    Temp (!) 95.1 F (35.1 C) (Temporal)    Resp 16    Ht '5\' 4"'$  (1.626 m)    Wt 199 lb 9.6 oz (90.5 kg)    SpO2 98%    BMI 34.26 kg/m  Wt Readings from Last 3 Encounters:  11/13/18 199 lb 9.6 oz (90.5 kg)  05/28/18 216 lb 3.2 oz (98.1 kg)  04/27/18 222 lb 9.6 oz (101 kg)     Lab Results  Component Value Date   WBC 9.3 11/07/2018   HGB 12.6 11/07/2018   HCT 39.3 11/07/2018   PLT 199.0 11/07/2018   GLUCOSE 92 11/07/2018   CHOL 180 11/07/2018   TRIG 258.0 (H) 11/07/2018   HDL 57.60 11/07/2018   LDLDIRECT 93.0 11/07/2018   LDLCALC 77 03/26/2018   ALT 13 11/07/2018   AST 13 11/07/2018   NA 139 11/07/2018   K 4.0 11/07/2018   CL 100 11/07/2018   CREATININE 0.60 11/07/2018   BUN 14 11/07/2018   CO2 30 11/07/2018   TSH 1.88  03/26/2018   HGBA1C 6.5 11/07/2018   MICROALBUR 7.5 (H) 03/26/2018    US Breast Ltd Uni Left Inc Axilla  Result Date: 11/09/2018 CLINICAL DATA:  Patient recalled from screening for left breast asymmetry. EXAM: DIGITAL DIAGNOSTIC LEFT MAMMOGRAM WITH CAD AND TOMO ULTRASOUND LEFT BREAST COMPARISON:  Previous exam(s). ACR Breast Density Category b: There are scattered areas of fibroglandular density. FINDINGS: Within the far posterior left breast medially  there is a 4 mm low-density oval circumscribed mass, further evaluated with spot compression cc views. This is not well demonstrated on the ML and MLO views. Mammographic images were processed with Targeted ultrasound is performed, showing a 2 x 2 x 1 mm oval circumscribed hypoechoic mass left breast 8 o'clock position 4 cm from nipple, potentially representing a small complicated cyst. IMPRESSION: Probably benign left breast mass 8 o'clock position. RECOMMENDATION: Left breast diagnostic mammography and ultrasound in 6 months to ensure stability of probably benign left breast mass. I have discussed the findings and recommendations with the patient. Results were also provided in writing at the conclusion of the visit. If applicable, a reminder letter will be sent to the patient regarding the next appointment. BI-RADS CATEGORY  3: Probably benign. Electronically Signed   By: Lovey Newcomer M.D.   On: 11/09/2018 14:36   Mm Diag Breast Tomo Uni Left  Result Date: 11/09/2018 CLINICAL DATA:  Patient recalled from screening for left breast asymmetry. EXAM: DIGITAL DIAGNOSTIC LEFT MAMMOGRAM WITH CAD AND TOMO ULTRASOUND LEFT BREAST COMPARISON:  Previous exam(s). ACR Breast Density Category b: There are scattered areas of fibroglandular density. FINDINGS: Within the far posterior left breast medially there is a 4 mm low-density oval circumscribed mass, further evaluated with spot compression cc views. This is not well demonstrated on the ML and MLO views. Mammographic images were processed with Targeted ultrasound is performed, showing a 2 x 2 x 1 mm oval circumscribed hypoechoic mass left breast 8 o'clock position 4 cm from nipple, potentially representing a small complicated cyst. IMPRESSION: Probably benign left breast mass 8 o'clock position. RECOMMENDATION: Left breast diagnostic mammography and ultrasound in 6 months to ensure stability of probably benign left breast mass. I have discussed the findings and  recommendations with the patient. Results were also provided in writing at the conclusion of the visit. If applicable, a reminder letter will be sent to the patient regarding the next appointment. BI-RADS CATEGORY  3: Probably benign. Electronically Signed   By: Lovey Newcomer M.D.   On: 11/09/2018 14:36       Assessment & Plan:   Problem List Items Addressed This Visit    Abnormal mammogram    Recent mammogram revealed what appeared to be a complicated cyst.  Discussed with her today.  Radiology recommended f/u left breast mammogram in 6 months.  After discussion, it was decided to have surgery evaluate to confirm if any further evaluation (for example, biopsy) at this time.       Relevant Orders   Ambulatory referral to General Surgery   Anemia    Follow cbc.       CAD (coronary artery disease)    Currently asymptomatic.  Continue risk factor modification.        Diabetes mellitus ([place])    Low carb diet and exercise.  Follow met b and a1c.       Relevant Orders   Hemoglobin A1c   Health care maintenance    Physical today 11/13/18.  Colonoscopy planned as outlined.  Mammogram as  outlined.  Recommended 6 month f/u.  Have surgery evaluate.        Hypercholesterolemia    On pravastatin.  Low cholesterol diet and exercise.  Follow lipid panel and liver function tests.        Relevant Orders   Hepatic function panel   Lipid panel   Hypertension    Blood pressure under good control.  Continue same medication regimen.  Follow pressures.  Follow metabolic panel.        Relevant Orders   Basic metabolic panel   Sleep apnea    CPAP.        Other Visit Diagnoses    Need for immunization against influenza       Relevant Orders   Flu Vaccine QUAD 36+ mos IM (Completed)       Einar Pheasant, MD

## 2018-11-18 ENCOUNTER — Encounter: Payer: Self-pay | Admitting: Internal Medicine

## 2018-11-18 DIAGNOSIS — R928 Other abnormal and inconclusive findings on diagnostic imaging of breast: Secondary | ICD-10-CM | POA: Insufficient documentation

## 2018-11-18 NOTE — Assessment & Plan Note (Signed)
Blood pressure under good control.  Continue same medication regimen.  Follow pressures.  Follow metabolic panel.   

## 2018-11-18 NOTE — Assessment & Plan Note (Signed)
Currently asymptomatic.  Continue risk factor modification.  

## 2018-11-18 NOTE — Assessment & Plan Note (Signed)
Recent mammogram revealed what appeared to be a complicated cyst.  Discussed with her today.  Radiology recommended f/u left breast mammogram in 6 months.  After discussion, it was decided to have surgery evaluate to confirm if any further evaluation (for example, biopsy) at this time.

## 2018-11-18 NOTE — Assessment & Plan Note (Signed)
Low carb diet and exercise.  Follow met b and a1c.  

## 2018-11-18 NOTE — Assessment & Plan Note (Signed)
CPAP.  

## 2018-11-18 NOTE — Assessment & Plan Note (Signed)
Physical today 11/13/18.  Colonoscopy planned as outlined.  Mammogram as outlined.  Recommended 6 month f/u.  Have surgery evaluate.

## 2018-11-18 NOTE — Assessment & Plan Note (Signed)
On pravastatin.  Low cholesterol diet and exercise.  Follow lipid panel and liver function tests.   

## 2018-11-18 NOTE — Assessment & Plan Note (Signed)
Follow cbc.  

## 2018-12-03 ENCOUNTER — Ambulatory Visit: Payer: BC Managed Care – PPO | Admitting: Surgery

## 2018-12-03 ENCOUNTER — Encounter: Payer: Self-pay | Admitting: Surgery

## 2018-12-03 ENCOUNTER — Other Ambulatory Visit: Payer: Self-pay

## 2018-12-03 NOTE — Patient Instructions (Signed)
The patient has been asked to return to the office in six months with a unilateral left breast diagnostic mammogram.  We will schedule you for removal of your lower back mass.

## 2018-12-05 ENCOUNTER — Encounter: Payer: Self-pay | Admitting: Internal Medicine

## 2018-12-06 ENCOUNTER — Other Ambulatory Visit: Payer: Self-pay

## 2018-12-06 MED ORDER — ESCITALOPRAM OXALATE 20 MG PO TABS: 20.0000 mg | ORAL_TABLET | Freq: Every day | ORAL | 1 refills | Status: DC

## 2018-12-07 ENCOUNTER — Encounter: Payer: Self-pay | Admitting: Internal Medicine

## 2018-12-07 ENCOUNTER — Other Ambulatory Visit: Payer: Self-pay

## 2018-12-07 MED ORDER — LISINOPRIL 20 MG PO TABS: 20.0000 mg | ORAL_TABLET | Freq: Every day | ORAL | 1 refills | Status: DC

## 2018-12-09 ENCOUNTER — Encounter: Payer: Self-pay | Admitting: Internal Medicine

## 2018-12-10 ENCOUNTER — Other Ambulatory Visit: Payer: Self-pay

## 2018-12-10 MED ORDER — FARXIGA 10 MG PO TABS: 10.0000 mg | ORAL_TABLET | Freq: Every morning | ORAL | 1 refills | Status: DC

## 2018-12-31 ENCOUNTER — Ambulatory Visit: Payer: BC Managed Care – PPO | Admitting: Surgery

## 2019-01-07 ENCOUNTER — Ambulatory Visit: Payer: BC Managed Care – PPO | Admitting: Surgery

## 2019-01-28 ENCOUNTER — Ambulatory Visit: Payer: Self-pay

## 2019-01-28 ENCOUNTER — Other Ambulatory Visit: Payer: Self-pay

## 2019-01-28 DIAGNOSIS — Z20822 Contact with and (suspected) exposure to covid-19: Secondary | ICD-10-CM

## 2019-01-28 NOTE — Telephone Encounter (Signed)
Outgoing call to Patient who states that she has been having Diarrhea, nausea, body aches , headaches.  Onset was Friday.  Reports that she does feel better today that yesterday.  Pt.  Requested locations where she may be tested.  Provided location and instructions.  Voiced Understanding.  Pt.  Will go get tested.        Reason for Disposition . COVID-19 Home Isolation, questions about  Answer Assessment - Initial Assessment Questions 1. COVID-19 DIAGNOSIS: "Who made your Coronavirus (COVID-19) diagnosis?" "Was it confirmed by a positive lab test?" If not diagnosed by a HCP, ask "Are there lots of cases (community spread) where you live?" (See public health department website, if unsure)     *No Answer* 2. ONSET: "When did the COVID-19 symptoms start?"      Friday 3. WORST SYMPTOM: "What is your worst symptom?" (e.g., cough, fever, shortness of breath, muscle aches)    headache and body ache 4. COUGH: "Do you have a cough?" If so, ask: "How bad is the cough?"       Slight cough and runny nose 5. FEVER: "Do you have a fever?" If so, ask: "What is your temperature, how was it measured, and when did it start?"    denies 6. RESPIRATORY STATUS: "Describe your breathing?" (e.g., shortness of breath, wheezing, unable to speak)     denies 7. BETTER-SAME-WORSE: "Are you getting better, staying the same or getting worse compared to yesterday?"  If getting worse, ask, "In what way?"     better 8. HIGH RISK DISEASE: "Do you have any chronic medical problems?" (e.g., asthma, heart or lung disease, weak immune system, etc.)   Just diabetes 9. PREGNANCY: "Is there any chance you are pregnant?" "When was your last menstrual period?"    na 10. OTHER SYMPTOMS: "Do you have any other symptoms?"  (e.g., chills, fatigue, headache, loss of smell or taste, muscle pain, sore throat)  Protocols used: CORONAVIRUS (COVID-19) DIAGNOSED OR SUSPECTED-A-AH

## 2019-01-28 NOTE — Telephone Encounter (Signed)
Patient going for testing for COVID see note below.

## 2019-01-28 NOTE — Telephone Encounter (Signed)
Routed to incorrect office. Sending to [place].

## 2019-01-29 NOTE — Telephone Encounter (Signed)
Called patient to see how she was doing and schedule an appointment to discuss. She does not feel that an appointment is necessary since she is not having any symptoms today. She would like to see what the results are first.

## 2019-01-30 LAB — NOVEL CORONAVIRUS, NAA: SARS-CoV-2, NAA: NOT DETECTED

## 2019-02-12 ENCOUNTER — Other Ambulatory Visit: Payer: Self-pay

## 2019-02-12 MED ORDER — INTEGRA 62.5-62.5-40-3 MG PO CAPS: 1.0000 | ORAL_CAPSULE | Freq: Two times a day (BID) | ORAL | 1 refills | Status: DC

## 2019-03-14 ENCOUNTER — Other Ambulatory Visit: Payer: BC Managed Care – PPO

## 2019-03-19 ENCOUNTER — Encounter: Payer: Self-pay | Admitting: Internal Medicine

## 2019-03-19 ENCOUNTER — Other Ambulatory Visit: Payer: Self-pay

## 2019-03-19 ENCOUNTER — Other Ambulatory Visit: Payer: BC Managed Care – PPO

## 2019-03-19 ENCOUNTER — Telehealth: Payer: Self-pay | Admitting: Internal Medicine

## 2019-03-19 ENCOUNTER — Ambulatory Visit (INDEPENDENT_AMBULATORY_CARE_PROVIDER_SITE_OTHER): Payer: BC Managed Care – PPO | Admitting: Internal Medicine

## 2019-03-19 VITALS — Ht 64.0 in | Wt 197.0 lb

## 2019-03-19 DIAGNOSIS — E119 Type 2 diabetes mellitus without complications: Secondary | ICD-10-CM | POA: Diagnosis not present

## 2019-03-19 DIAGNOSIS — R928 Other abnormal and inconclusive findings on diagnostic imaging of breast: Secondary | ICD-10-CM | POA: Diagnosis not present

## 2019-03-19 DIAGNOSIS — E7800 Pure hypercholesterolemia, unspecified: Secondary | ICD-10-CM

## 2019-03-19 DIAGNOSIS — I25118 Atherosclerotic heart disease of native coronary artery with other forms of angina pectoris: Secondary | ICD-10-CM | POA: Diagnosis not present

## 2019-03-19 DIAGNOSIS — G4730 Sleep apnea, unspecified: Secondary | ICD-10-CM

## 2019-03-19 DIAGNOSIS — M25561 Pain in right knee: Secondary | ICD-10-CM

## 2019-03-19 DIAGNOSIS — I10 Essential (primary) hypertension: Secondary | ICD-10-CM

## 2019-03-19 DIAGNOSIS — M25562 Pain in left knee: Secondary | ICD-10-CM

## 2019-03-19 DIAGNOSIS — D649 Anemia, unspecified: Secondary | ICD-10-CM | POA: Diagnosis not present

## 2019-03-19 DIAGNOSIS — Z9109 Other allergy status, other than to drugs and biological substances: Secondary | ICD-10-CM

## 2019-03-19 MED ORDER — ESTRADIOL 0.5 MG PO TABS: 0.5000 mg | ORAL_TABLET | Freq: Every day | ORAL | 3 refills | Status: DC

## 2019-03-19 NOTE — Telephone Encounter (Signed)
Pr needs f/u left breast mammogram and ultrasound in 04/2019.  Please schedule.  Pt can go anytime. order in system.

## 2019-03-19 NOTE — Telephone Encounter (Signed)
rx sent in for estradiol .5mg .

## 2019-03-19 NOTE — Telephone Encounter (Signed)
Lm for pt to call back and schedule a fasting lab in 2-3 weeks also a follow up in 33m

## 2019-03-19 NOTE — Progress Notes (Signed)
Patient ID: Evelyn James, female   DOB: [birth year], 59 y.o.   MRN: [id number]   Virtual Visit via telephone Note  This visit type was conducted due to national recommendations for restrictions regarding the COVID-19 pandemic (e.g. social distancing).  This format is felt to be most appropriate for this patient at this time.  All issues noted in this document were discussed and addressed.  No physical exam was performed (except for noted visual exam findings with Video Visits).   I connected with Evelyn James by telephone and verified that I am speaking with the correct person using two identifiers. Location patient: home Location provider: work  Persons participating in the telephone visit: patient, provider  The limitations, risks, security and privacy concerns of performing an evaluation and management service by telephone and the availability of in person appointments have been discussed.  The patient expressed understanding and agreed to proceed.   Reason for visit: scheduled follow up.    HPI: She reports she is doing well.  Has been feeling good.  Handling stress.  Multiple family members recently diagnosed with covid.  She did not.  No cough, congestion or sob reported.  Not as active.  Does walk the dog.  No chest pain or sob with increased activity or exertion.  Occasionally will feel her heart race.  Resolves if lies down.  Is stable.  States may go months in between times it occurs.  Not associated with increased activity.  No abdominal pain or bowel change reported.  Her knee is doing ok.  Wears a knee sleeve daily.  Takes meloxicam prn.  Her back and buttock pain - ok.  Weight remaining stable around 197 pounds.  Sugars averaging - am 116 and pm 120s. No headache or dizziness.  Needs to change premarin.  Insurance requesting change.  Will forward information to me of preferred estrogen.  Handling stress.     ROS: See pertinent positives and negatives per HPI.  Past Medical  History:  Diagnosis Date  . Anemia    Noted 11/2011  . Anxiety   . Chest pain    Admitted 11/2011: cath with mild nonobstructive coronary plaque, normal EF, negative cardiac enzymes and negative d-dimer.  . Diabetes mellitus   . Fatty liver disease, nonalcoholic 7517  . GERD (gastroesophageal reflux disease)   . Hypercholesterolemia   . Hypertension   . Reflux   . TMJ dysfunction     Past Surgical History:  Procedure Laterality Date  . ABDOMINAL HYSTERECTOMY  2009   total  . BILATERAL SALPINGOOPHORECTOMY  2009   benign ovarian tumor  . HERNIA REPAIR     umbilical  . IMAGE GUIDED SINUS SURGERY N/A 10/22/2015   Procedure: IMAGE GUIDED SINUS SURGERY;  Surgeon: Carloyn Manner, MD;  Location: [facility] ORS;  Service: ENT;  Laterality: N/A;  . KNEE SURGERY Bilateral   . LEFT HEART CATHETERIZATION WITH CORONARY ANGIOGRAM N/A 12/09/2011   Procedure: LEFT HEART CATHETERIZATION WITH CORONARY ANGIOGRAM;  Surgeon: Minus Breeding, MD;  Location: [facility] CATH LAB;  Service: Cardiovascular;  Laterality: N/A;  . SPHENOIDECTOMY Left 10/22/2015   Procedure: SPHENOIDECTOMY;  Surgeon: Carloyn Manner, MD;  Location: [facility] ORS;  Service: ENT;  Laterality: Left;  . SUBMANDIBULAR MASS EXCISION  1996   [facility]     Family History  Problem Relation Age of Onset  . Heart disease Father        Father had CABG in his 84s  . Colon polyps Father   . Lung cancer  Mother 19  . Breast cancer Paternal Grandmother 64  . Arthritis/Rheumatoid Paternal Grandfather   . Breast cancer Paternal Aunt 23    SOCIAL HX: reviewed.    Current Outpatient Medications:  .  acetaminophen (TYLENOL) 500 MG tablet, Take 1,000 mg by mouth every 6 (six) hours as needed for mild pain., Disp: , Rfl:  .  acyclovir ointment (ZOVIRAX) 5 %, Apply 1 application topically every 3 (three) hours., Disp: 5 g, Rfl: 2 .  Blood Glucose Monitoring Suppl (ONE TOUCH ULTRA 2) w/Device KIT, U D TO CHECK BLOOD SUGARS, Disp: , Rfl: 0 .  cetirizine (ZYRTEC)  10 MG tablet, Take 10 mg by mouth daily., Disp: , Rfl:  .  cyclobenzaprine (FLEXERIL) 10 MG tablet, Take 0.5-1 tablets (5-10 mg total) by mouth at bedtime as needed for muscle spasms., Disp: 30 tablet, Rfl: 0 .  dapagliflozin propanediol (FARXIGA) 10 MG TABS tablet, Take 10 mg by mouth every morning., Disp: 90 tablet, Rfl: 1 .  escitalopram (LEXAPRO) 20 MG tablet, Take 1 tablet (20 mg total) by mouth daily., Disp: 90 tablet, Rfl: 1 .  esomeprazole (NEXIUM) 20 MG capsule, Take 20 mg by mouth daily at 12 noon., Disp: , Rfl:  .  estradiol (ESTRACE) 0.5 MG tablet, Take 1 tablet (0.5 mg total) by mouth daily., Disp: 30 tablet, Rfl: 3 .  Fe Fum-FePoly-Vit C-Vit B3 (INTEGRA) 62.5-62.5-40-3 MG CAPS, Take 1 capsule by mouth 2 (two) times daily., Disp: 180 capsule, Rfl: 1 .  fluconazole (DIFLUCAN) 150 MG tablet, Take one tablet x 1.  If persistent symptoms after three days may repeat x 1., Disp: 2 tablet, Rfl: 0 .  fluticasone (FLONASE) 50 MCG/ACT nasal spray, Place 2 sprays into both nostrils daily., Disp: 16 g, Rfl: 5 .  glucose blood test strip, three times before mills., Disp: 100 each, Rfl: 12 .  lisinopril (ZESTRIL) 20 MG tablet, Take 1 tablet (20 mg total) by mouth daily., Disp: 90 tablet, Rfl: 1 .  Melatonin 10 MG TABS, Take 10 mg by mouth at bedtime as needed (sleep)., Disp: , Rfl:  .  meloxicam (MOBIC) 15 MG tablet, Take 15 mg by mouth daily., Disp: , Rfl: 3 .  metFORMIN (GLUCOPHAGE) 1000 MG tablet, Take 1 tablet (1,000 mg total) by mouth 2 (two) times daily with a meal., Disp: 180 tablet, Rfl: 1 .  mupirocin ointment (BACTROBAN) 2 %, Place 1 application into the nose 2 (two) times daily., Disp: 22 g, Rfl: 0 .  pravastatin (PRAVACHOL) 40 MG tablet, Take 1 tablet (40 mg total) by mouth daily., Disp: 90 tablet, Rfl: 1 .  Probiotic Product (PHILLIPS COLON HEALTH PO), Take 1 tablet by mouth daily as needed (colon health). , Disp: , Rfl:  .  TRULICITY 1.5 FM/3.8GY SOPN, inject 1.5 milligrams  subcutaneously every 7 days, Disp: , Rfl: 0 .  valACYclovir (VALTREX) 1000 MG tablet, Take 1 tablet (1,000 mg total) by mouth 2 (two) times daily., Disp: 21 tablet, Rfl: 0  EXAM:  GENERAL: alert, oriented, appears well and in no acute distress  HEENT: atraumatic, conjunttiva clear, no obvious abnormalities on inspection of external nose and ears  NECK: normal movements of the head and neck  LUNGS: on inspection no signs of respiratory distress, breathing rate appears normal, no obvious gross SOB, gasping or wheezing  CV: no obvious cyanosis  PSYCH/NEURO: pleasant and cooperative, no obvious depression or anxiety, speech and thought processing grossly intact  ASSESSMENT AND PLAN:  Discussed the following assessment and plan:  Anemia Follow cbc.   CAD (coronary artery disease) Currently asymptomatic.  Continue risk factor modification.    Diabetes mellitus Low carb diet and exercise.  Sugars as outlined.  Follow met b and a1c.    Environmental allergies Appeared to be controlled.    Hypercholesterolemia On pravastatin.  Low cholesterol diet and exercise.  Follow lipid panel and liver function tests.    Hypertension Blood pressure under good control.  Continue same medication regimen.  Follow pressures.  Follow metabolic panel.    Knee pain Saw ortho previously.  S/p injection.  Wears a knee sleeve.  Stable currently.  Follow.    Sleep apnea CPAP.    Orders Placed This Encounter  Procedures  . MM DIAG BREAST TOMO UNI LEFT    Standing Status:   Future    Standing Expiration Date:   05/16/2020    Order Specific Question:   Reason for Exam (SYMPTOM  OR DIAGNOSIS REQUIRED)    Answer:   f/u abnormal mammogram    Order Specific Question:   Is the patient pregnant?    Answer:   No    Order Specific Question:   Preferred imaging location?    Answer:   [facility]  . US BREAST LTD UNI LEFT INC AXILLA    Standing Status:   Future    Standing Expiration Date:    05/18/2020    Order Specific Question:   Reason for Exam (SYMPTOM  OR DIAGNOSIS REQUIRED)    Answer:   f/u abnormal mammmogram    Order Specific Question:   Preferred imaging location?    Answer:   [facility]  . TSH    Standing Status:   Future    Standing Expiration Date:   03/23/2020  . DM Microalbumin / creatinine urine ratio    Standing Status:   Future    Standing Expiration Date:   03/23/2020     I discussed the assessment and treatment plan with the patient. The patient was provided an opportunity to ask questions and all were answered. The patient agreed with the plan and demonstrated an understanding of the instructions.   The patient was advised to call back or seek an in-person evaluation if the symptoms worsen or if the condition fails to improve as anticipated.  I provided 15 minutes of non-face-to-face time during this encounter.   Einar Pheasant, MD

## 2019-03-20 NOTE — Telephone Encounter (Signed)
Pt already scheduled

## 2019-03-24 ENCOUNTER — Encounter: Payer: Self-pay | Admitting: Internal Medicine

## 2019-03-24 NOTE — Assessment & Plan Note (Signed)
Appeared to be controlled.  

## 2019-03-24 NOTE — Assessment & Plan Note (Signed)
Low carb diet and exercise.  Sugars as outlined.  Follow met b and a1c.   

## 2019-03-24 NOTE — Assessment & Plan Note (Signed)
Currently asymptomatic.  Continue risk factor modification.  

## 2019-03-24 NOTE — Assessment & Plan Note (Signed)
CPAP.  

## 2019-03-24 NOTE — Assessment & Plan Note (Signed)
Follow cbc.  

## 2019-03-24 NOTE — Assessment & Plan Note (Signed)
Blood pressure under good control.  Continue same medication regimen.  Follow pressures.  Follow metabolic panel.   

## 2019-03-24 NOTE — Assessment & Plan Note (Signed)
Saw ortho previously.  S/p injection.  Wears a knee sleeve.  Stable currently.  Follow.

## 2019-03-24 NOTE — Assessment & Plan Note (Signed)
On pravastatin.  Low cholesterol diet and exercise.  Follow lipid panel and liver function tests.   

## 2019-04-10 ENCOUNTER — Encounter: Payer: Self-pay | Admitting: Internal Medicine

## 2019-04-10 ENCOUNTER — Other Ambulatory Visit: Payer: Self-pay | Admitting: Lab

## 2019-04-10 MED ORDER — METFORMIN HCL 1000 MG PO TABS: 1000.0000 mg | ORAL_TABLET | Freq: Two times a day (BID) | ORAL | 1 refills | Status: DC

## 2019-04-10 MED ORDER — PRAVASTATIN SODIUM 40 MG PO TABS: 40.0000 mg | ORAL_TABLET | Freq: Every day | ORAL | 1 refills | Status: DC

## 2019-04-11 ENCOUNTER — Other Ambulatory Visit: Payer: Self-pay

## 2019-04-11 ENCOUNTER — Other Ambulatory Visit (INDEPENDENT_AMBULATORY_CARE_PROVIDER_SITE_OTHER): Payer: BC Managed Care – PPO

## 2019-04-11 DIAGNOSIS — E7800 Pure hypercholesterolemia, unspecified: Secondary | ICD-10-CM | POA: Diagnosis not present

## 2019-04-11 DIAGNOSIS — I10 Essential (primary) hypertension: Secondary | ICD-10-CM | POA: Diagnosis not present

## 2019-04-11 DIAGNOSIS — E119 Type 2 diabetes mellitus without complications: Secondary | ICD-10-CM

## 2019-04-11 LAB — BASIC METABOLIC PANEL
BUN: 14 mg/dL (ref 6–23)
CO2: 32 mEq/L (ref 19–32)
Calcium: 9.3 mg/dL (ref 8.4–10.5)
Chloride: 99 mEq/L (ref 96–112)
Creatinine, Ser: 0.58 mg/dL (ref 0.40–1.20)
GFR: 106 mL/min (ref >60.00)
Glucose, Bld: 103 mg/dL — ABNORMAL HIGH (ref 70–99)
Potassium: 4.2 mEq/L (ref 3.5–5.1)
Sodium: 138 mEq/L (ref 135–145)

## 2019-04-11 LAB — HEMOGLOBIN A1C: Hgb A1c MFr Bld: 6.5 % (ref 4.6–6.5)

## 2019-04-11 LAB — MICROALBUMIN / CREATININE URINE RATIO
Creatinine,U: 40.2 mg/dL
Microalb Creat Ratio: 1.8 mg/g (ref 0.0–30.0)
Microalb, Ur: 0.7 mg/dL (ref 0.0–1.9)

## 2019-04-11 LAB — HEPATIC FUNCTION PANEL
ALT: 13 U/L (ref 0–35)
AST: 12 U/L (ref 0–37)
Albumin: 4.1 g/dL (ref 3.5–5.2)
Alkaline Phosphatase: 57 U/L (ref 39–117)
Bilirubin, Direct: 0 mg/dL (ref 0.0–0.3)
Total Bilirubin: 0.3 mg/dL (ref 0.2–1.2)
Total Protein: 6.8 g/dL (ref 6.0–8.3)

## 2019-04-11 LAB — TSH: TSH: 1.73 u[IU]/mL (ref 0.35–4.50)

## 2019-04-11 LAB — LIPID PANEL
Cholesterol: 198 mg/dL (ref 0–200)
HDL: 73.7 mg/dL (ref >39.00)
LDL Cholesterol: 94 mg/dL (ref 0–99)
NonHDL: 124.71
Total CHOL/HDL Ratio: 3
Triglycerides: 154 mg/dL — ABNORMAL HIGH (ref 0.0–149.0)
VLDL: 30.8 mg/dL (ref 0.0–40.0)

## 2019-04-16 ENCOUNTER — Encounter: Payer: Self-pay | Admitting: Internal Medicine

## 2019-05-13 ENCOUNTER — Ambulatory Visit
Admission: RE | Admit: 2019-05-13 | Discharge: 2019-05-13 | Disposition: A | Discharge status: Home / Self Care | Payer: BC Managed Care – PPO | Source: Ambulatory Visit | Attending: Internal Medicine | Admitting: Internal Medicine

## 2019-05-13 DIAGNOSIS — R928 Other abnormal and inconclusive findings on diagnostic imaging of breast: Secondary | ICD-10-CM | POA: Insufficient documentation

## 2019-05-14 ENCOUNTER — Other Ambulatory Visit: Payer: Self-pay | Admitting: Internal Medicine

## 2019-05-14 ENCOUNTER — Telehealth: Payer: Self-pay | Admitting: Lab

## 2019-05-14 DIAGNOSIS — Z1231 Encounter for screening mammogram for malignant neoplasm of breast: Secondary | ICD-10-CM

## 2019-05-14 NOTE — Progress Notes (Signed)
Order placed for f/u screening mammogram.

## 2019-05-14 NOTE — Telephone Encounter (Signed)
Called Pt No answer, left VM to call office.  

## 2019-05-15 NOTE — Telephone Encounter (Signed)
Pt called returning your call 

## 2019-05-16 ENCOUNTER — Ambulatory Visit: Payer: BC Managed Care – PPO | Admitting: Internal Medicine

## 2019-05-16 NOTE — Telephone Encounter (Signed)
See result note.  

## 2019-05-20 ENCOUNTER — Ambulatory Visit: Payer: BC Managed Care – PPO | Admitting: Surgery

## 2019-06-05 ENCOUNTER — Other Ambulatory Visit: Payer: Self-pay | Admitting: Internal Medicine

## 2019-06-05 ENCOUNTER — Encounter: Payer: Self-pay | Admitting: Internal Medicine

## 2019-06-07 ENCOUNTER — Other Ambulatory Visit: Payer: Self-pay

## 2019-06-07 MED ORDER — LISINOPRIL 20 MG PO TABS: 20.0000 mg | ORAL_TABLET | Freq: Every day | ORAL | 1 refills | Status: DC

## 2019-06-17 ENCOUNTER — Encounter: Payer: Self-pay | Admitting: Internal Medicine

## 2019-06-17 ENCOUNTER — Other Ambulatory Visit: Payer: Self-pay

## 2019-06-17 ENCOUNTER — Ambulatory Visit: Payer: BC Managed Care – PPO | Admitting: Internal Medicine

## 2019-06-17 ENCOUNTER — Ambulatory Visit (INDEPENDENT_AMBULATORY_CARE_PROVIDER_SITE_OTHER): Payer: BC Managed Care – PPO

## 2019-06-17 VITALS — BP 116/74 | HR 96 | Temp 95.8°F | Resp 16 | Wt 196.0 lb

## 2019-06-17 DIAGNOSIS — R059 Cough, unspecified: Secondary | ICD-10-CM | POA: Insufficient documentation

## 2019-06-17 DIAGNOSIS — R05 Cough: Secondary | ICD-10-CM

## 2019-06-17 DIAGNOSIS — K219 Gastro-esophageal reflux disease without esophagitis: Secondary | ICD-10-CM

## 2019-06-17 DIAGNOSIS — I10 Essential (primary) hypertension: Secondary | ICD-10-CM | POA: Diagnosis not present

## 2019-06-17 DIAGNOSIS — Z8616 Personal history of COVID-19: Secondary | ICD-10-CM

## 2019-06-17 DIAGNOSIS — E119 Type 2 diabetes mellitus without complications: Secondary | ICD-10-CM

## 2019-06-17 DIAGNOSIS — I25118 Atherosclerotic heart disease of native coronary artery with other forms of angina pectoris: Secondary | ICD-10-CM

## 2019-06-17 DIAGNOSIS — D649 Anemia, unspecified: Secondary | ICD-10-CM

## 2019-06-17 DIAGNOSIS — K5900 Constipation, unspecified: Secondary | ICD-10-CM

## 2019-06-17 DIAGNOSIS — M25562 Pain in left knee: Secondary | ICD-10-CM

## 2019-06-17 DIAGNOSIS — E7800 Pure hypercholesterolemia, unspecified: Secondary | ICD-10-CM | POA: Diagnosis not present

## 2019-06-17 DIAGNOSIS — M25561 Pain in right knee: Secondary | ICD-10-CM

## 2019-06-17 HISTORY — DX: Personal history of COVID-19: Z86.16

## 2019-06-17 LAB — HM DIABETES FOOT EXAM

## 2019-06-17 MED ORDER — ESTRADIOL 1 MG PO TABS: 1.0000 mg | ORAL_TABLET | Freq: Every day | ORAL | 2 refills | Status: DC

## 2019-06-17 NOTE — Progress Notes (Signed)
Patient ID: Evelyn James, female   DOB: [birth year], 59 y.o.   MRN: [id number]   Subjective:    Patient ID: Evelyn James, female    DOB: [birth year], 59 y.o.   MRN: [id number]  HPI This visit occurred during the SARS-CoV-2 public health emergency.  Safety protocols were in place, including screening questions prior to the visit, additional usage of staff PPE, and extensive cleaning of exam room while observing appropriate contact time as indicated for disinfecting solutions.  Patient here for a scheduled follow up. She reports she is doing relatively well. Got married 05/10/19.  Diagnosed with covid 03/2019.  Has had some residual congestion.  Using saline nasal spray and flonase.  Also using humidifier.  Some cough - occasionally productive of yellow mucus.  Also reports increased acid reflux.  Taking nexium.  Taking PEG and citracil.  Keeps bowels moving.  Some increased gas.  No chest pain or sob reported.  Some hot flashes.  Blood sugars < 120.  S/p injection kneed - 06/11/19.     Past Medical History:  Diagnosis Date  . Anemia    Noted 11/2011  . Anxiety   . Chest pain    Admitted 11/2011: cath with mild nonobstructive coronary plaque, normal EF, negative cardiac enzymes and negative d-dimer.  . Diabetes mellitus   . Fatty liver disease, nonalcoholic 4503  . GERD (gastroesophageal reflux disease)   . Hypercholesterolemia   . Hypertension   . Reflux   . TMJ dysfunction    Past Surgical History:  Procedure Laterality Date  . ABDOMINAL HYSTERECTOMY  2009   total  . BILATERAL SALPINGOOPHORECTOMY  2009   benign ovarian tumor  . HERNIA REPAIR     umbilical  . IMAGE GUIDED SINUS SURGERY N/A 10/22/2015   Procedure: IMAGE GUIDED SINUS SURGERY;  Surgeon: Carloyn Manner, MD;  Location: [facility] ORS;  Service: ENT;  Laterality: N/A;  . KNEE SURGERY Bilateral   . LEFT HEART CATHETERIZATION WITH CORONARY ANGIOGRAM N/A 12/09/2011   Procedure: LEFT HEART CATHETERIZATION WITH CORONARY ANGIOGRAM;   Surgeon: Minus Breeding, MD;  Location: [facility] CATH LAB;  Service: Cardiovascular;  Laterality: N/A;  . SPHENOIDECTOMY Left 10/22/2015   Procedure: SPHENOIDECTOMY;  Surgeon: Carloyn Manner, MD;  Location: [facility] ORS;  Service: ENT;  Laterality: Left;  . SUBMANDIBULAR MASS EXCISION  1996   [facility]    Family History  Problem Relation Age of Onset  . Heart disease Father        Father had CABG in his 67s  . Colon polyps Father   . Lung cancer Mother 72  . Breast cancer Paternal Grandmother 21  . Arthritis/Rheumatoid Paternal Grandfather   . Breast cancer Paternal Aunt 38   Social History   Socioeconomic History  . Marital status: Married    Spouse name: Not on file  . Number of children: 2  . Years of education: Not on file  . Highest education level: Not on file  Occupational History  . Not on file  Tobacco Use  . Smoking status: Never Smoker  . Smokeless tobacco: Never Used  Substance and Sexual Activity  . Alcohol use: No    Alcohol/week: 0.0 standard drinks  . Drug use: No  . Sexual activity: Not on file  Other Topics Concern  . Not on file  Social History Narrative  . Not on file   Social Determinants of Health   Financial Resource Strain:   . Difficulty of Paying Living Expenses:   Food Insecurity:   .  Worried About Charity fundraiser in the Last Year:   . Arboriculturist in the Last Year:   Transportation Needs:   . Film/video editor (Medical):   Marland Kitchen Lack of Transportation (Non-Medical):   Physical Activity:   . Days of Exercise per Week:   . Minutes of Exercise per Session:   Stress:   . Feeling of Stress :   Social Connections:   . Frequency of Communication with Friends and Family:   . Frequency of Social Gatherings with Friends and Family:   . Attends Religious Services:   . Active Member of Clubs or Organizations:   . Attends Archivist Meetings:   Marland Kitchen Marital Status:     Outpatient Encounter Medications as of 06/17/2019  Medication Sig  .  acetaminophen (TYLENOL) 500 MG tablet Take 1,000 mg by mouth every 6 (six) hours as needed for mild pain.  Marland Kitchen acyclovir ointment (ZOVIRAX) 5 % Apply 1 application topically every 3 (three) hours.  . Blood Glucose Monitoring Suppl (ONE TOUCH ULTRA 2) w/Device KIT U D TO CHECK BLOOD SUGARS  . cetirizine (ZYRTEC) 10 MG tablet Take 10 mg by mouth daily.  . cyclobenzaprine (FLEXERIL) 10 MG tablet Take 0.5-1 tablets (5-10 mg total) by mouth at bedtime as needed for muscle spasms.  Marland Kitchen escitalopram (LEXAPRO) 20 MG tablet TAKE 1 TABLET(20 MG) BY MOUTH DAILY  . esomeprazole (NEXIUM) 20 MG capsule Take 20 mg by mouth daily at 12 noon.  Marland Kitchen estradiol (ESTRACE) 1 MG tablet Take 1 tablet (1 mg total) by mouth daily.  Marland Kitchen FARXIGA 10 MG TABS tablet TAKE 1 TABLET BY MOUTH EVERY MORNING  . Fe Fum-FePoly-Vit C-Vit B3 (INTEGRA) 62.5-62.5-40-3 MG CAPS Take 1 capsule by mouth 2 (two) times daily.  . fluconazole (DIFLUCAN) 150 MG tablet Take one tablet x 1.  If persistent symptoms after three days may repeat x 1.  . fluticasone (FLONASE) 50 MCG/ACT nasal spray Place 2 sprays into both nostrils daily.  Marland Kitchen glucose blood test strip three times before mills.  Marland Kitchen lisinopril (ZESTRIL) 20 MG tablet Take 1 tablet (20 mg total) by mouth daily.  . Melatonin 10 MG TABS Take 10 mg by mouth at bedtime as needed (sleep).  . meloxicam (MOBIC) 15 MG tablet Take 15 mg by mouth daily.  . metFORMIN (GLUCOPHAGE) 1000 MG tablet Take 1 tablet (1,000 mg total) by mouth 2 (two) times daily with a meal.  . mupirocin ointment (BACTROBAN) 2 % Place 1 application into the nose 2 (two) times daily.  . pravastatin (PRAVACHOL) 40 MG tablet Take 1 tablet (40 mg total) by mouth daily.  . Probiotic Product (PHILLIPS COLON HEALTH PO) Take 1 tablet by mouth daily as needed (colon health).   . TRULICITY 1.5 DV/7.6HY SOPN inject 1.5 milligrams subcutaneously every 7 days  . valACYclovir (VALTREX) 1000 MG tablet Take 1 tablet (1,000 mg total) by mouth 2 (two)  times daily.  . [DISCONTINUED] estradiol (ESTRACE) 0.5 MG tablet Take 1 tablet (0.5 mg total) by mouth daily.   No facility-administered encounter medications on file as of 06/17/2019.    Review of Systems  Constitutional: Negative for appetite change and unexpected weight change.  HENT: Positive for congestion. Negative for sinus pressure.   Respiratory: Positive for cough. Negative for chest tightness and shortness of breath.   Cardiovascular: Negative for chest pain, palpitations and leg swelling.  Gastrointestinal: Negative for abdominal pain, diarrhea, nausea and vomiting.       Some increased  acid reflux as outlined.    Genitourinary: Negative for difficulty urinating and dysuria.  Musculoskeletal: Negative for joint swelling and myalgias.  Skin: Negative for color change and rash.  Neurological: Negative for dizziness, light-headedness and headaches.  Psychiatric/Behavioral: Negative for agitation and dysphoric mood.       Objective:    Physical Exam Vitals reviewed.  Constitutional:      General: She is not in acute distress.    Appearance: Normal appearance.  HENT:     Head: Normocephalic and atraumatic.     Right Ear: External ear normal.     Left Ear: External ear normal.  Eyes:     General: No scleral icterus.       Right eye: No discharge.        Left eye: No discharge.     Conjunctiva/sclera: Conjunctivae normal.  Neck:     Thyroid: No thyromegaly.  Cardiovascular:     Rate and Rhythm: Normal rate and regular rhythm.  Pulmonary:     Effort: No respiratory distress.     Breath sounds: Normal breath sounds. No wheezing.  Abdominal:     General: Bowel sounds are normal.     Palpations: Abdomen is soft.     Tenderness: There is no abdominal tenderness.  Musculoskeletal:        General: No swelling or tenderness.     Cervical back: Neck supple. No tenderness.  Lymphadenopathy:     Cervical: No cervical adenopathy.  Skin:    Findings: No erythema or rash.    Neurological:     Mental Status: She is alert.  Psychiatric:        Mood and Affect: Mood normal.        Behavior: Behavior normal.     BP 116/74   Pulse 96   Temp (!) 95.8 F (35.4 C)   Resp 16   Wt 196 lb (88.9 kg)   SpO2 98%   BMI 33.64 kg/m  Wt Readings from Last 3 Encounters:  06/17/19 196 lb (88.9 kg)  03/19/19 197 lb (89.4 kg)  11/13/18 199 lb 9.6 oz (90.5 kg)     Lab Results  Component Value Date   WBC 9.3 11/07/2018   HGB 12.6 11/07/2018   HCT 39.3 11/07/2018   PLT 199.0 11/07/2018   GLUCOSE 103 (H) 04/11/2019   CHOL 198 04/11/2019   TRIG 154.0 (H) 04/11/2019   HDL 73.70 04/11/2019   LDLDIRECT 93.0 11/07/2018   LDLCALC 94 04/11/2019   ALT 13 04/11/2019   AST 12 04/11/2019   NA 138 04/11/2019   K 4.2 04/11/2019   CL 99 04/11/2019   CREATININE 0.58 04/11/2019   BUN 14 04/11/2019   CO2 32 04/11/2019   TSH 1.73 04/11/2019   HGBA1C 6.5 04/11/2019   MICROALBUR 0.7 04/11/2019    US BREAST LTD UNI LEFT INC AXILLA  Result Date: 05/13/2019 CLINICAL DATA:  59 year old patient presents for six-month follow-up of probably benign findings in the left breast. EXAM: DIGITAL DIAGNOSTIC LEFT MAMMOGRAM WITH CAD AND TOMO ULTRASOUND LEFT BREAST COMPARISON:  November 09, 2018 and November 01, 2018 ACR Breast Density Category b: There are scattered areas of fibroglandular density. FINDINGS: No mass, distortion, or suspicious microcalcification is identified in the left breast to suggest malignancy. Specifically, the previously identified 4 mm low-density circumscribed mass in the far posterior medial left breast is not identified on today's mammogram. Mammographic images were processed with CAD. Targeted ultrasound is performed, showing normal fibroglandular tissue in the  8 o'clock axis of the left breast. No mass is identified on ultrasound in this region. IMPRESSION: No evidence of malignancy in the left breast. The previously visualized probably benign mass in the 8 o'clock  region is not identified today, suggesting interval resolution of a benign process. RECOMMENDATION: Bilateral screening mammogram is recommended in August 2021. I have discussed the findings and recommendations with the patient. If applicable, a reminder letter will be sent to the patient regarding the next appointment. BI-RADS CATEGORY  1: Negative. Electronically Signed   By: Curlene Dolphin M.D.   On: 05/13/2019 14:31   MM DIAG BREAST TOMO UNI LEFT  Result Date: 05/13/2019 CLINICAL DATA:  59 year old patient presents for six-month follow-up of probably benign findings in the left breast. EXAM: DIGITAL DIAGNOSTIC LEFT MAMMOGRAM WITH CAD AND TOMO ULTRASOUND LEFT BREAST COMPARISON:  November 09, 2018 and November 01, 2018 ACR Breast Density Category b: There are scattered areas of fibroglandular density. FINDINGS: No mass, distortion, or suspicious microcalcification is identified in the left breast to suggest malignancy. Specifically, the previously identified 4 mm low-density circumscribed mass in the far posterior medial left breast is not identified on today's mammogram. Mammographic images were processed with CAD. Targeted ultrasound is performed, showing normal fibroglandular tissue in the 8 o'clock axis of the left breast. No mass is identified on ultrasound in this region. IMPRESSION: No evidence of malignancy in the left breast. The previously visualized probably benign mass in the 8 o'clock region is not identified today, suggesting interval resolution of a benign process. RECOMMENDATION: Bilateral screening mammogram is recommended in August 2021. I have discussed the findings and recommendations with the patient. If applicable, a reminder letter will be sent to the patient regarding the next appointment. BI-RADS CATEGORY  1: Negative. Electronically Signed   By: Curlene Dolphin M.D.   On: 05/13/2019 14:31       Assessment & Plan:   Problem List Items Addressed This Visit    Anemia    Follow cbc.  Has  had GI w/up previously.       Relevant Orders   CBC with Differential/Platelet   CAD (coronary artery disease)    Currently asymptomatic.  Continue risk factor modification.        Constipation    Takes miralax and citracil.  Follow.        Cough    Increased congestion and cough as outlined.  Previously diagnosed with covid.  Continue flonase and saline nasal spray.  Continue humidifier.  mucinex as directed.  Check cxr given persistent symptoms s/p covid.  Increase nexium to bid and control acid reflux.        Relevant Orders   DG Chest 2 View (Completed)   Diabetes mellitus ([place])    Low carb diet and exercise.  On mitformin and farxiga.    Follow met b and a1c.        Relevant Orders   Hemoglobin A1c   GERD (gastroesophageal reflux disease)    On nexium 20m q day.  Increase to bid.  Follow.        History of COVID-19    Cough and congestion as outlined.  Mucinex, flonase and saline nasal spray as directed.  Check cxr.  Follow.        Relevant Orders   DG Chest 2 View (Completed)   Hypercholesterolemia - Primary    On pravastatin.  Low cholesterol diet and exercise.  Follow lipid panel and liver function tests.  Relevant Orders   Hepatic function panel   Lipid panel   Hypertension    Blood pressure as outlined.  On lisinopril.  Follow pressures.  Follow metabolic panel.       Relevant Orders   Basic metabolic panel   Knee pain    Saw ortho.  S/p injection.            Einar Pheasant, MD

## 2019-06-17 NOTE — Patient Instructions (Signed)
Increase nexium (generic) to twice a day.

## 2019-06-18 ENCOUNTER — Encounter: Payer: Self-pay | Admitting: Internal Medicine

## 2019-06-18 NOTE — Telephone Encounter (Signed)
Called pt.  States she has had two episodes of chest pressure/discomfort.  One occurred yesterday pm and one occurred earlier today.  No pain now.  Occurred after lifting something heavy.  No sob.  Discussed the need for evaluation now.  Advised ER evaluation to confirm nothing more acute going on.  She expressed understanding and stated since actively not having pain now, she wanted to wait.  Advised again for ER evaluation now.

## 2019-06-23 ENCOUNTER — Encounter: Payer: Self-pay | Admitting: Internal Medicine

## 2019-06-23 DIAGNOSIS — K219 Gastro-esophageal reflux disease without esophagitis: Secondary | ICD-10-CM | POA: Insufficient documentation

## 2019-06-23 NOTE — Assessment & Plan Note (Signed)
On nexium 20mg  q day.  Increase to bid.  Follow.

## 2019-06-23 NOTE — Assessment & Plan Note (Signed)
Low carb diet and exercise.  On mitformin and farxiga.    Follow met b and a1c.

## 2019-06-23 NOTE — Assessment & Plan Note (Signed)
Follow cbc.  Has had GI w/up previously.

## 2019-06-23 NOTE — Assessment & Plan Note (Signed)
Takes miralax and citracil.  Follow.

## 2019-06-23 NOTE — Assessment & Plan Note (Signed)
Currently asymptomatic.  Continue risk factor modification.  

## 2019-06-23 NOTE — Assessment & Plan Note (Signed)
Blood pressure as outlined.  On lisinopril.  Follow pressures.  Follow metabolic panel.  

## 2019-06-23 NOTE — Assessment & Plan Note (Addendum)
Increased congestion and cough as outlined.  Previously diagnosed with covid.  Continue flonase and saline nasal spray.  Continue humidifier.  mucinex as directed.  Check cxr given persistent symptoms s/p covid.  Increase nexium to bid and control acid reflux.

## 2019-06-23 NOTE — Assessment & Plan Note (Signed)
Saw ortho.  S/p injection.  

## 2019-06-23 NOTE — Assessment & Plan Note (Signed)
On pravastatin.  Low cholesterol diet and exercise.  Follow lipid panel and liver function tests.   

## 2019-06-23 NOTE — Assessment & Plan Note (Signed)
Cough and congestion as outlined.  Mucinex, flonase and saline nasal spray as directed.  Check cxr.  Follow.

## 2019-06-25 ENCOUNTER — Encounter: Payer: Self-pay | Admitting: Internal Medicine

## 2019-08-01 ENCOUNTER — Encounter: Payer: Self-pay | Admitting: Internal Medicine

## 2019-08-02 NOTE — Telephone Encounter (Signed)
Ok.  Can clarify if wants 90 day or 30 days.  Ok to send in for either with refills.

## 2019-08-05 MED ORDER — TRULICITY 1.5 MG/0.5ML ~~LOC~~ SOAJ: SUBCUTANEOUS | 4 refills | Status: DC

## 2019-08-15 ENCOUNTER — Other Ambulatory Visit: Payer: Self-pay

## 2019-08-15 ENCOUNTER — Other Ambulatory Visit (INDEPENDENT_AMBULATORY_CARE_PROVIDER_SITE_OTHER): Payer: BC Managed Care – PPO

## 2019-08-15 DIAGNOSIS — E7800 Pure hypercholesterolemia, unspecified: Secondary | ICD-10-CM | POA: Diagnosis not present

## 2019-08-15 DIAGNOSIS — D649 Anemia, unspecified: Secondary | ICD-10-CM | POA: Diagnosis not present

## 2019-08-15 DIAGNOSIS — E119 Type 2 diabetes mellitus without complications: Secondary | ICD-10-CM

## 2019-08-15 DIAGNOSIS — I10 Essential (primary) hypertension: Secondary | ICD-10-CM | POA: Diagnosis not present

## 2019-08-15 LAB — LIPID PANEL
Cholesterol: 193 mg/dL (ref 0–200)
HDL: 59.1 mg/dL (ref >39.00)
LDL Cholesterol: 96 mg/dL (ref 0–99)
NonHDL: 134.28
Total CHOL/HDL Ratio: 3
Triglycerides: 191 mg/dL — ABNORMAL HIGH (ref 0.0–149.0)
VLDL: 38.2 mg/dL (ref 0.0–40.0)

## 2019-08-15 LAB — CBC WITH DIFFERENTIAL/PLATELET
Basophils Absolute: 0.1 10*3/uL (ref 0.0–0.1)
Basophils Relative: 0.5 % (ref 0.0–3.0)
Eosinophils Absolute: 0.3 10*3/uL (ref 0.0–0.7)
Eosinophils Relative: 2.7 % (ref 0.0–5.0)
HCT: 41.7 % (ref 36.0–46.0)
Hemoglobin: 13.6 g/dL (ref 12.0–15.0)
Lymphocytes Relative: 29.3 % (ref 12.0–46.0)
Lymphs Abs: 2.9 10*3/uL (ref 0.7–4.0)
MCHC: 32.6 g/dL (ref 30.0–36.0)
MCV: 89 fl (ref 78.0–100.0)
Monocytes Absolute: 0.6 10*3/uL (ref 0.1–1.0)
Monocytes Relative: 5.5 % (ref 3.0–12.0)
Neutro Abs: 6.2 10*3/uL (ref 1.4–7.7)
Neutrophils Relative %: 62 % (ref 43.0–77.0)
Platelets: 211 10*3/uL (ref 150.0–400.0)
RBC: 4.69 Mil/uL (ref 3.87–5.11)
RDW: 14.2 % (ref 11.5–15.5)
WBC: 10 10*3/uL (ref 4.0–10.5)

## 2019-08-15 LAB — BASIC METABOLIC PANEL
BUN: 13 mg/dL (ref 6–23)
CO2: 32 mEq/L (ref 19–32)
Calcium: 9.2 mg/dL (ref 8.4–10.5)
Chloride: 101 mEq/L (ref 96–112)
Creatinine, Ser: 0.59 mg/dL (ref 0.40–1.20)
GFR: 103.81 mL/min (ref >60.00)
Glucose, Bld: 101 mg/dL — ABNORMAL HIGH (ref 70–99)
Potassium: 4 mEq/L (ref 3.5–5.1)
Sodium: 139 mEq/L (ref 135–145)

## 2019-08-15 LAB — HEPATIC FUNCTION PANEL
ALT: 12 U/L (ref 0–35)
AST: 12 U/L (ref 0–37)
Albumin: 4.1 g/dL (ref 3.5–5.2)
Alkaline Phosphatase: 53 U/L (ref 39–117)
Bilirubin, Direct: 0.1 mg/dL (ref 0.0–0.3)
Total Bilirubin: 0.3 mg/dL (ref 0.2–1.2)
Total Protein: 6.8 g/dL (ref 6.0–8.3)

## 2019-08-15 LAB — HEMOGLOBIN A1C: Hgb A1c MFr Bld: 6.5 % (ref 4.6–6.5)

## 2019-08-19 ENCOUNTER — Other Ambulatory Visit: Payer: Self-pay

## 2019-08-19 ENCOUNTER — Ambulatory Visit (INDEPENDENT_AMBULATORY_CARE_PROVIDER_SITE_OTHER): Payer: BC Managed Care – PPO | Admitting: Internal Medicine

## 2019-08-19 DIAGNOSIS — E119 Type 2 diabetes mellitus without complications: Secondary | ICD-10-CM

## 2019-08-19 DIAGNOSIS — I10 Essential (primary) hypertension: Secondary | ICD-10-CM

## 2019-08-19 DIAGNOSIS — E7800 Pure hypercholesterolemia, unspecified: Secondary | ICD-10-CM | POA: Diagnosis not present

## 2019-08-19 DIAGNOSIS — R05 Cough: Secondary | ICD-10-CM

## 2019-08-19 DIAGNOSIS — G4730 Sleep apnea, unspecified: Secondary | ICD-10-CM | POA: Diagnosis not present

## 2019-08-19 DIAGNOSIS — K219 Gastro-esophageal reflux disease without esophagitis: Secondary | ICD-10-CM

## 2019-08-19 DIAGNOSIS — Z8616 Personal history of COVID-19: Secondary | ICD-10-CM

## 2019-08-19 DIAGNOSIS — D649 Anemia, unspecified: Secondary | ICD-10-CM

## 2019-08-19 DIAGNOSIS — R059 Cough, unspecified: Secondary | ICD-10-CM

## 2019-08-19 MED ORDER — FLUCONAZOLE 150 MG PO TABS: ORAL_TABLET | ORAL | 0 refills | Status: DC

## 2019-08-19 MED ORDER — CYCLOBENZAPRINE HCL 10 MG PO TABS: 5.0000 mg | ORAL_TABLET | Freq: Every evening | ORAL | 0 refills | Status: DC | PRN

## 2019-08-19 NOTE — Progress Notes (Signed)
Patient ID: Evelyn James, female   DOB: 02-07-1961, 59 y.o.   MRN: [id number]   Subjective:    Patient ID: Evelyn James, female    DOB: 07-17-1960, 59 y.o.   MRN: [id number]  HPI This visit occurred during the SARS-CoV-2 public health emergency.  Safety protocols were in place, including screening questions prior to the visit, additional usage of staff PPE, and extensive cleaning of exam room while observing appropriate contact time as indicated for disinfecting solutions.  Patient here for a scheduled follow up.  She reports she is doing relatively well. covid 03/2019.  Still having persistent cough.  Coughing at night.  some mucus production - yellow mucus at times.  Has been persistent.  Taking zyrtec.  also using flonase, etc.  Treating acid reflux.  Occasional nausea.  Taking fiber and miralax.  Regular bowel movements.  No chest pain.  No chest tightness or increased sob reported.  cxr 06/17/19 - no active cardiopulmonary disease.  Eating and drinking.  No vomiting.  Is concerned regarding yeast infection.  Request diflucan.  Got married 04/2019.  Moving boxes, etc.  Request refill flexeril. Discussed recent labs.    Past Medical History:  Diagnosis Date  . Anemia    Noted 11/2011  . Anxiety   . Chest pain    Admitted 11/2011: cath with mild nonobstructive coronary plaque, normal EF, negative cardiac enzymes and negative d-dimer.  . Diabetes mellitus   . Fatty liver disease, nonalcoholic 5465  . GERD (gastroesophageal reflux disease)   . Hypercholesterolemia   . Hypertension   . Reflux   . TMJ dysfunction    Past Surgical History:  Procedure Laterality Date  . ABDOMINAL HYSTERECTOMY  2009   total  . BILATERAL SALPINGOOPHORECTOMY  2009   benign ovarian tumor  . HERNIA REPAIR     umbilical  . IMAGE GUIDED SINUS SURGERY N/A 10/22/2015   Procedure: IMAGE GUIDED SINUS SURGERY;  Surgeon: Carloyn Manner, MD;  Location: [facility] ORS;  Service: ENT;  Laterality: N/A;  . KNEE SURGERY  Bilateral   . LEFT HEART CATHETERIZATION WITH CORONARY ANGIOGRAM N/A 12/09/2011   Procedure: LEFT HEART CATHETERIZATION WITH CORONARY ANGIOGRAM;  Surgeon: Minus Breeding, MD;  Location: [facility];  Service: Cardiovascular;  Laterality: N/A;  . SPHENOIDECTOMY Left 10/22/2015   Procedure: SPHENOIDECTOMY;  Surgeon: Carloyn Manner, MD;  Location: [facility] ORS;  Service: ENT;  Laterality: Left;  . SUBMANDIBULAR MASS EXCISION  1996   [facility]    Family History  Problem Relation Age of Onset  . Heart disease Father        Father had CABG in his 69s  . Colon polyps Father   . Lung cancer Mother 71  . Breast cancer Paternal Grandmother 36  . Arthritis/Rheumatoid Paternal Grandfather   . Breast cancer Paternal Aunt 54   Social History   Socioeconomic History  . Marital status: Married    Spouse name: Not on file  . Number of children: 2  . Years of education: Not on file  . Highest education level: Not on file  Occupational History  . Not on file  Tobacco Use  . Smoking status: Never Smoker  . Smokeless tobacco: Never Used  Vaping Use  . Vaping Use: Never used  Substance and Sexual Activity  . Alcohol use: No    Alcohol/week: 0.0 standard drinks  . Drug use: No  . Sexual activity: Not on file  Other Topics Concern  . Not on file  Social History  Narrative  . Not on file   Social Determinants of Health   Financial Resource Strain:   . Difficulty of Paying Living Expenses:   Food Insecurity:   . Worried About Charity fundraiser in the Last Year:   . Arboriculturist in the Last Year:   Transportation Needs:   . Film/video editor (Medical):   Marland Kitchen Lack of Transportation (Non-Medical):   Physical Activity:   . Days of Exercise per Week:   . Minutes of Exercise per Session:   Stress:   . Feeling of Stress :   Social Connections:   . Frequency of Communication with Friends and Family:   . Frequency of Social Gatherings with Friends and Family:   . Attends Religious Services:     . Active Member of Clubs or Organizations:   . Attends Archivist Meetings:   Marland Kitchen Marital Status:     Outpatient Encounter Medications as of 08/19/2019  Medication Sig  . acetaminophen (TYLENOL) 500 MG tablet Take 1,000 mg by mouth every 6 (six) hours as needed for mild pain.  Marland Kitchen acyclovir ointment (ZOVIRAX) 5 % Apply 1 application topically every 3 (three) hours.  . Blood Glucose Monitoring Suppl (ONE TOUCH ULTRA 2) w/Device KIT U D TO CHECK BLOOD SUGARS  . cetirizine (ZYRTEC) 10 MG tablet Take 10 mg by mouth daily.  . cyclobenzaprine (FLEXERIL) 10 MG tablet Take 0.5-1 tablets (5-10 mg total) by mouth at bedtime as needed for muscle spasms.  Marland Kitchen escitalopram (LEXAPRO) 20 MG tablet TAKE 1 TABLET(20 MG) BY MOUTH DAILY  . esomeprazole (NEXIUM) 20 MG capsule Take 20 mg by mouth daily at 12 noon.  Marland Kitchen estradiol (ESTRACE) 1 MG tablet Take 1 tablet (1 mg total) by mouth daily.  Marland Kitchen FARXIGA 10 MG TABS tablet TAKE 1 TABLET BY MOUTH EVERY MORNING  . Fe Fum-FePoly-Vit C-Vit B3 (INTEGRA) 62.5-62.5-40-3 MG CAPS Take 1 capsule by mouth 2 (two) times daily.  . fluconazole (DIFLUCAN) 150 MG tablet Take one tablet x 1.  If persistent symptoms after three days may repeat x 1.  . fluticasone (FLONASE) 50 MCG/ACT nasal spray Place 2 sprays into both nostrils daily.  Marland Kitchen glucose blood test strip three times before mills.  Marland Kitchen lisinopril (ZESTRIL) 20 MG tablet Take 1 tablet (20 mg total) by mouth daily.  . Melatonin 10 MG TABS Take 10 mg by mouth at bedtime as needed (sleep).  . meloxicam (MOBIC) 15 MG tablet Take 15 mg by mouth daily.  . metFORMIN (GLUCOPHAGE) 1000 MG tablet Take 1 tablet (1,000 mg total) by mouth 2 (two) times daily with a meal.  . mupirocin ointment (BACTROBAN) 2 % Place 1 application into the nose 2 (two) times daily.  . pravastatin (PRAVACHOL) 40 MG tablet Take 1 tablet (40 mg total) by mouth daily.  . Probiotic Product (PHILLIPS COLON HEALTH PO) Take 1 tablet by mouth daily as needed (colon  health).   . TRULICITY 1.5 GG/8.3MO SOPN inject 1.5 milligrams subcutaneously every 7 days  . valACYclovir (VALTREX) 1000 MG tablet Take 1 tablet (1,000 mg total) by mouth 2 (two) times daily.  . [DISCONTINUED] cyclobenzaprine (FLEXERIL) 10 MG tablet Take 0.5-1 tablets (5-10 mg total) by mouth at bedtime as needed for muscle spasms.  . [DISCONTINUED] fluconazole (DIFLUCAN) 150 MG tablet Take one tablet x 1.  If persistent symptoms after three days may repeat x 1.   No facility-administered encounter medications on file as of 08/19/2019.    Review of  Systems  Constitutional: Negative for appetite change and unexpected weight change.  HENT: Positive for congestion. Negative for sinus pressure.   Respiratory: Positive for cough. Negative for chest tightness and shortness of breath.   Cardiovascular: Negative for chest pain, palpitations and leg swelling.  Gastrointestinal: Negative for abdominal pain, diarrhea, nausea and vomiting.  Genitourinary: Negative for difficulty urinating and dysuria.  Musculoskeletal: Negative for joint swelling and myalgias.  Skin: Negative for color change and rash.  Neurological: Negative for dizziness, light-headedness and headaches.  Psychiatric/Behavioral: Negative for agitation and dysphoric mood.       Objective:    Physical Exam Constitutional:      General: She is not in acute distress.    Appearance: Normal appearance.  HENT:     Head: Normocephalic and atraumatic.     Right Ear: External ear normal.     Left Ear: External ear normal.  Eyes:     General: No scleral icterus.       Right eye: No discharge.        Left eye: No discharge.     Conjunctiva/sclera: Conjunctivae normal.  Neck:     Thyroid: No thyromegaly.  Cardiovascular:     Rate and Rhythm: Normal rate and regular rhythm.  Pulmonary:     Effort: No respiratory distress.     Breath sounds: Normal breath sounds. No wheezing.  Abdominal:     General: Bowel sounds are normal.      Palpations: Abdomen is soft.     Tenderness: There is no abdominal tenderness.  Musculoskeletal:        General: No swelling or tenderness.     Cervical back: Neck supple. No tenderness.  Lymphadenopathy:     Cervical: No cervical adenopathy.  Skin:    Findings: No erythema or rash.  Neurological:     Mental Status: She is alert.  Psychiatric:        Mood and Affect: Mood normal.        Behavior: Behavior normal.     BP 122/78   Pulse 84   Temp (!) 96.8 F (36 C)   Resp 16   Ht '5\' 4"'$  (1.626 m)   Wt 199 lb (90.3 kg)   SpO2 98%   BMI 34.16 kg/m  Wt Readings from Last 3 Encounters:  08/19/19 199 lb (90.3 kg)  06/17/19 196 lb (88.9 kg)  03/19/19 197 lb (89.4 kg)     Lab Results  Component Value Date   WBC 10.0 08/15/2019   HGB 13.6 08/15/2019   HCT 41.7 08/15/2019   PLT 211.0 08/15/2019   GLUCOSE 101 (H) 08/15/2019   CHOL 193 08/15/2019   TRIG 191.0 (H) 08/15/2019   HDL 59.10 08/15/2019   LDLDIRECT 93.0 11/07/2018   LDLCALC 96 08/15/2019   ALT 12 08/15/2019   AST 12 08/15/2019   NA 139 08/15/2019   K 4.0 08/15/2019   CL 101 08/15/2019   CREATININE 0.59 08/15/2019   BUN 13 08/15/2019   CO2 32 08/15/2019   TSH 1.73 04/11/2019   HGBA1C 6.5 08/15/2019   MICROALBUR 0.7 04/11/2019    US BREAST LTD UNI LEFT INC AXILLA  Result Date: 05/13/2019 CLINICAL DATA:  59 year old patient presents for six-month follow-up of probably benign findings in the left breast. EXAM: DIGITAL DIAGNOSTIC LEFT MAMMOGRAM WITH CAD AND TOMO ULTRASOUND LEFT BREAST COMPARISON:  November 09, 2018 and November 01, 2018 ACR Breast Density Category b: There are scattered areas of fibroglandular density. FINDINGS: No mass, distortion,  or suspicious microcalcification is identified in the left breast to suggest malignancy. Specifically, the previously identified 4 mm low-density circumscribed mass in the far posterior medial left breast is not identified on today's mammogram. Mammographic images were  processed with CAD. Targeted ultrasound is performed, showing normal fibroglandular tissue in the 8 o'clock axis of the left breast. No mass is identified on ultrasound in this region. IMPRESSION: No evidence of malignancy in the left breast. The previously visualized probably benign mass in the 8 o'clock region is not identified today, suggesting interval resolution of a benign process. RECOMMENDATION: Bilateral screening mammogram is recommended in August 2021. I have discussed the findings and recommendations with the patient. If applicable, a reminder letter will be sent to the patient regarding the next appointment. BI-RADS CATEGORY  1: Negative. Electronically Signed   By: Curlene Dolphin M.D.   On: 05/13/2019 14:31   MM DIAG BREAST TOMO UNI LEFT  Result Date: 05/13/2019 CLINICAL DATA:  59 year old patient presents for six-month follow-up of probably benign findings in the left breast. EXAM: DIGITAL DIAGNOSTIC LEFT MAMMOGRAM WITH CAD AND TOMO ULTRASOUND LEFT BREAST COMPARISON:  November 09, 2018 and November 01, 2018 ACR Breast Density Category b: There are scattered areas of fibroglandular density. FINDINGS: No mass, distortion, or suspicious microcalcification is identified in the left breast to suggest malignancy. Specifically, the previously identified 4 mm low-density circumscribed mass in the far posterior medial left breast is not identified on today's mammogram. Mammographic images were processed with CAD. Targeted ultrasound is performed, showing normal fibroglandular tissue in the 8 o'clock axis of the left breast. No mass is identified on ultrasound in this region. IMPRESSION: No evidence of malignancy in the left breast. The previously visualized probably benign mass in the 8 o'clock region is not identified today, suggesting interval resolution of a benign process. RECOMMENDATION: Bilateral screening mammogram is recommended in August 2021. I have discussed the findings and recommendations with the  patient. If applicable, a reminder letter will be sent to the patient regarding the next appointment. BI-RADS CATEGORY  1: Negative. Electronically Signed   By: Curlene Dolphin M.D.   On: 05/13/2019 14:31       Assessment & Plan:   Problem List Items Addressed This Visit    Anemia    Follow cbc.        Cough    Persistent cough and congestion.  Recent cxr clear.  Acid reflux being treated.  Continues on zyrtec and flonase.  Given persistent cough, refer to pulmonary for further w/up and evaluation.        Relevant Orders   Ambulatory referral to Pulmonology   Diabetes mellitus ([place])    Low carb diet and exercise.  Follow met b and a1c.  a1c 6.5 on recent check.  Continue farxiga, trulicity and metformin.        Relevant Orders   Hemoglobin N6E   Basic metabolic panel   GERD (gastroesophageal reflux disease)    No increased upper symptoms reported.  Occasional nasuea.  Continue PPI.       History of COVID-19    Persistent cough and congestion.  Recent cxr clear.  Acid reflux being treated.  Continues on zyrtec and flonase.  Given persistent cough, refer to pulmonary for further w/up and evaluation.        Relevant Orders   Ambulatory referral to Pulmonology   Hypercholesterolemia    On pravastatin.  Low cholesterol diet and exercise.  Follow lipid panel and liver function tests.  Relevant Orders   Hepatic function panel   Lipid panel   Hypertension    Blood pressure doing well.  On lisinopril.  Continue current medication.  Follow pressures.  Follow metabolic panel.       Sleep apnea    CPAP.           Einar Pheasant, MD

## 2019-08-25 ENCOUNTER — Encounter: Payer: Self-pay | Admitting: Internal Medicine

## 2019-08-25 NOTE — Assessment & Plan Note (Signed)
No increased upper symptoms reported.  Occasional nasuea.  Continue PPI.

## 2019-08-25 NOTE — Assessment & Plan Note (Signed)
Persistent cough and congestion.  Recent cxr clear.  Acid reflux being treated.  Continues on zyrtec and flonase.  Given persistent cough, refer to pulmonary for further w/up and evaluation.   

## 2019-08-25 NOTE — Assessment & Plan Note (Signed)
Blood pressure doing well.  On lisinopril.  Continue current medication.  Follow pressures.  Follow metabolic panel.

## 2019-08-25 NOTE — Assessment & Plan Note (Signed)
Low carb diet and exercise.  Follow met b and a1c.  a1c 6.5 on recent check.  Continue farxiga, trulicity and metformin.

## 2019-08-25 NOTE — Assessment & Plan Note (Signed)
Persistent cough and congestion.  Recent cxr clear.  Acid reflux being treated.  Continues on zyrtec and flonase.  Given persistent cough, refer to pulmonary for further w/up and evaluation.

## 2019-08-25 NOTE — Assessment & Plan Note (Signed)
CPAP.  

## 2019-08-25 NOTE — Assessment & Plan Note (Signed)
Follow cbc.  

## 2019-08-25 NOTE — Assessment & Plan Note (Signed)
On pravastatin.  Low cholesterol diet and exercise.  Follow lipid panel and liver function tests.   

## 2019-09-06 ENCOUNTER — Other Ambulatory Visit: Payer: Self-pay | Admitting: Internal Medicine

## 2019-09-26 ENCOUNTER — Other Ambulatory Visit: Payer: Self-pay | Admitting: Internal Medicine

## 2019-11-11 ENCOUNTER — Encounter: Payer: Self-pay | Admitting: Internal Medicine

## 2019-11-13 ENCOUNTER — Encounter: Payer: Self-pay | Admitting: Internal Medicine

## 2019-11-13 MED ORDER — METFORMIN HCL 1000 MG PO TABS: 1000.0000 mg | ORAL_TABLET | Freq: Two times a day (BID) | ORAL | 1 refills | Status: DC

## 2019-11-13 NOTE — Telephone Encounter (Signed)
Pt needs refill on Metformin. She has a new name which I am changing in system now. Pt is currently out of the medication.

## 2019-11-14 ENCOUNTER — Other Ambulatory Visit: Payer: Self-pay

## 2019-11-14 ENCOUNTER — Ambulatory Visit: Payer: BC Managed Care – PPO | Admitting: Pulmonary Disease

## 2019-11-14 ENCOUNTER — Encounter: Payer: Self-pay | Admitting: Pulmonary Disease

## 2019-11-14 VITALS — BP 114/80 | HR 88 | Ht 64.0 in | Wt 196.0 lb

## 2019-11-14 DIAGNOSIS — R05 Cough: Secondary | ICD-10-CM

## 2019-11-14 DIAGNOSIS — R053 Chronic cough: Secondary | ICD-10-CM

## 2019-11-14 MED ORDER — ALBUTEROL SULFATE HFA 108 (90 BASE) MCG/ACT IN AERS
2.0000 | INHALATION_SPRAY | Freq: Four times a day (QID) | RESPIRATORY_TRACT | 6 refills | Status: DC | PRN
Start: 2019-11-14 — End: 2020-12-15

## 2019-11-14 MED ORDER — FLOVENT HFA 110 MCG/ACT IN AERO
2.0000 | INHALATION_SPRAY | Freq: Two times a day (BID) | RESPIRATORY_TRACT | 12 refills | Status: DC
Start: 2019-11-14 — End: 2020-06-23

## 2019-11-14 NOTE — Patient Instructions (Signed)
Flovent two puffs in the morning and two puffs in the evening, and rinse your mouth after each use to prevent development of thrush  Albuterol two puffs every 6 hours as needed for cough, wheezing, chest congestion, or shortness of breath  Will arrange for pulmonary function test  Follow up in 4 weeks with Dr. Craige Cotta or Nurse Practitioner

## 2019-11-14 NOTE — Progress Notes (Signed)
Pulmonary, Critical Care, and Sleep Medicine  Chief Complaint  Patient presents with  . Consult    Patient had Covid in Feb. Had some shortness of breath but states that it is much better. Productive cough with yellow sputum since getting covid. Worse in the morning gets better throughout day and then gets worse late evening. HX of OSA wears CPAP currently.    Constitutional:  BP 114/80 (BP Location: Left Arm, Patient Position: Sitting, Cuff Size: Large)   Pulse 88   Ht _0  (1.626 m)   Wt 196 lb (88.9 kg)   SpO2 97%   BMI 33.64 kg/m   Past Medical History:  COVID 19 infection February 2021, Anemia, Anxiety, DM, Fatty liver, GERD, HLD, TMJ, Obstructive sleep apnea  Past Surgical History:  Her  has a past surgical history that includes Submandibular mass excision (1996); Knee surgery (Bilateral); Bilateral salpingoophorectomy (2009); left heart catheterization with coronary angiogram (N/A, 12/09/2011); Sphenoidectomy (Left, 10/22/2015); Image guided sinus surgery (N/A, 10/22/2015); Hernia repair; and Abdominal hysterectomy (2009).  Brief Summary:  Evelyn James is a 59 y.o. female with cough      Subjective:   She has history of seasonal allergies with most trouble in Spring and Fall.  She never had allergy testing.  Uses flonase and zyrtec.  She started having more respiratory symptoms in January 2021.  She was diagnosed with COVID 19 infection in February 2021.  She didn't need hospitalization, supplemental oxygen, or specific therapy for COVID.  She slowly recovered, but has been left with a chronic cough.    She was getting wheezing and chest tightness, but this has improved some.  She still has cough and brings up plugs.  She doesn't feel like her breathing limits her activity.  She hasn't been tried on an inhaler for her lungs.  No prior history of asthma.  She denies skin rashes.  No food allergies.    She is retired from office work.  Never smoked.  Has several  pet cats.  No prior history of pneumonia or TB.    CXR from 06/17/19 was normal.  Physical Exam:   Appearance - well kempt   ENMT - no sinus tenderness, no oral exudate, no LAN, Mallampati 3 airway, no stridor, decreased AP diameter  Respiratory - equal breath sounds bilaterally, no wheezing or rales  CV - s1s2 regular rate and rhythm, no murmurs  Ext - no clubbing, no edema  Skin - no rashes  Psych - normal mood and affect   Pulmonary testing:    Chest Imaging:    Social History:  She  reports that she has never smoked. She has never used smokeless tobacco. She reports that she does not drink alcohol and does not use drugs.  Family History:  Her family history includes Arthritis/Rheumatoid in her paternal grandfather; Breast cancer (age of onset: 58) in her paternal grandmother; Breast cancer (age of onset: 69) in her paternal aunt; Colon polyps in her father; Heart disease in her father; Lung cancer (age of onset: 48) in her mother.    Discussion:  She has history of allergies.  She developed persistent productive cough since having COVID 19 infection in January/February of 2021.  Her current symptoms are more suggestive of asthma.  Assessment/Plan:   Chronic cough. - likely from asthma  - will have her try flovent 110 mcg bid with prn albuterol - demonstrated inhaler technique and explained role for different inhalers - will arrange for pulmonary function  test - don't think she needs additional imaging studies a this time - she will be getting lab work with her PCP in one week  Allergic rhinitis. - continue flonase and zyrtec  ACE inhibitor use. - I don't think this is playing role with her cough at present - if her cough doesn't improve, then she might need trial off ACE inhibitor  GERD. - this seems controlled with use of nexium, and I don't think this is contributing to her cough symptoms at present  Obstructive sleep apnea. - she reports compliance with  CPAP - managed through her PCP  Time Spent Involved in Patient Care on Day of Examination:  48 minutes  Follow up:  Patient Instructions  Flovent two puffs in the morning and two puffs in the evening, and rinse your mouth after each use to prevent development of thrush  Albuterol two puffs every 6 hours as needed for cough, wheezing, chest congestion, or shortness of breath  Will arrange for pulmonary function test  Follow up in 4 weeks with Dr. Halford Chessman or Nurse Practitioner   Medication List:   Allergies as of 11/14/2019      Reactions   Sulfa Antibiotics Other (See Comments)   Topical--red itchy; oral--makes mouth raw Bleeding sores in mouth. Raw mouth and sores on gums      Medication List       Accurate as of November 14, 2019 11:46 AM. If you have any questions, ask your nurse or doctor.        acetaminophen 500 MG tablet Commonly known as: TYLENOL Take 1,000 mg by mouth every 6 (six) hours as needed for mild pain.   acyclovir ointment 5 % Commonly known as: Zovirax Apply 1 application topically every 3 (three) hours.   albuterol 108 (90 Base) MCG/ACT inhaler Commonly known as: VENTOLIN HFA Inhale 2 puffs into the lungs every 6 (six) hours as needed for wheezing or shortness of breath. Started by: Chesley Mires, MD   cetirizine 10 MG tablet Commonly known as: ZYRTEC Take 10 mg by mouth daily.   cyclobenzaprine 10 MG tablet Commonly known as: FLEXERIL Take 0.5-1 tablets (5-10 mg total) by mouth at bedtime as needed for muscle spasms.   escitalopram 20 MG tablet Commonly known as: LEXAPRO TAKE 1 TABLET(20 MG) BY MOUTH DAILY   esomeprazole 20 MG capsule Commonly known as: NEXIUM Take 20 mg by mouth daily at 12 noon.   estradiol 1 MG tablet Commonly known as: ESTRACE TAKE 1 TABLET(1 MG) BY MOUTH DAILY   Farxiga 10 MG Tabs tablet Generic drug: dapagliflozin propanediol TAKE 1 TABLET BY MOUTH EVERY MORNING   Flovent HFA 110 MCG/ACT inhaler Generic drug:  fluticasone Inhale 2 puffs into the lungs in the morning and at bedtime. Started by: Chesley Mires, MD   fluconazole 150 MG tablet Commonly known as: DIFLUCAN Take one tablet x 1.  If persistent symptoms after three days may repeat x 1.   fluticasone 50 MCG/ACT nasal spray Commonly known as: FLONASE Place 2 sprays into both nostrils daily.   glucose blood test strip three times before mills.   Integra 62.5-62.5-40-3 MG Caps TAKE 1 CAPSULE BY MOUTH TWICE DAILY   lisinopril 20 MG tablet Commonly known as: ZESTRIL Take 1 tablet (20 mg total) by mouth daily.   Melatonin 10 MG Tabs Take 10 mg by mouth at bedtime as needed (sleep).   meloxicam 15 MG tablet Commonly known as: MOBIC Take 15 mg by mouth daily.   metFORMIN  1000 MG tablet Commonly known as: GLUCOPHAGE Take 1 tablet (1,000 mg total) by mouth 2 (two) times daily with a meal.   mupirocin ointment 2 % Commonly known as: BACTROBAN Place 1 application into the nose 2 (two) times daily.   ONE TOUCH ULTRA 2 w/Device Kit U D TO CHECK BLOOD SUGARS   PHILLIPS COLON HEALTH PO Take 1 tablet by mouth daily as needed (colon health).   pravastatin 40 MG tablet Commonly known as: PRAVACHOL TAKE 1 TABLET(40 MG) BY MOUTH DAILY   Trulicity 1.5 PO/2.5PG Sopn Generic drug: Dulaglutide inject 1.5 milligrams subcutaneously every 7 days   valACYclovir 1000 MG tablet Commonly known as: VALTREX Take 1 tablet (1,000 mg total) by mouth 2 (two) times daily.       Signature:  Chesley Mires, MD [place] Pager - [phone] 11/14/2019, 11:46 AM

## 2019-11-20 ENCOUNTER — Other Ambulatory Visit: Payer: Self-pay

## 2019-11-20 ENCOUNTER — Other Ambulatory Visit (INDEPENDENT_AMBULATORY_CARE_PROVIDER_SITE_OTHER): Payer: BC Managed Care – PPO

## 2019-11-20 DIAGNOSIS — E7800 Pure hypercholesterolemia, unspecified: Secondary | ICD-10-CM

## 2019-11-20 DIAGNOSIS — E119 Type 2 diabetes mellitus without complications: Secondary | ICD-10-CM

## 2019-11-20 LAB — HEPATIC FUNCTION PANEL
ALT: 11 U/L (ref 0–35)
AST: 10 U/L (ref 0–37)
Albumin: 4 g/dL (ref 3.5–5.2)
Alkaline Phosphatase: 58 U/L (ref 39–117)
Bilirubin, Direct: 0.1 mg/dL (ref 0.0–0.3)
Total Bilirubin: 0.3 mg/dL (ref 0.2–1.2)
Total Protein: 6.7 g/dL (ref 6.0–8.3)

## 2019-11-20 LAB — HEMOGLOBIN A1C: Hgb A1c MFr Bld: 6.5 % (ref 4.6–6.5)

## 2019-11-20 LAB — BASIC METABOLIC PANEL
BUN: 16 mg/dL (ref 6–23)
CO2: 33 mEq/L — ABNORMAL HIGH (ref 19–32)
Calcium: 9.2 mg/dL (ref 8.4–10.5)
Chloride: 101 mEq/L (ref 96–112)
Creatinine, Ser: 0.58 mg/dL (ref 0.40–1.20)
GFR: 105.79 mL/min (ref >60.00)
Glucose, Bld: 106 mg/dL — ABNORMAL HIGH (ref 70–99)
Potassium: 4.2 mEq/L (ref 3.5–5.1)
Sodium: 141 mEq/L (ref 135–145)

## 2019-11-20 LAB — LIPID PANEL
Cholesterol: 185 mg/dL (ref 0–200)
HDL: 62.2 mg/dL (ref >39.00)
LDL Cholesterol: 100 mg/dL — ABNORMAL HIGH (ref 0–99)
NonHDL: 122.72
Total CHOL/HDL Ratio: 3
Triglycerides: 114 mg/dL (ref 0.0–149.0)
VLDL: 22.8 mg/dL (ref 0.0–40.0)

## 2019-11-22 ENCOUNTER — Ambulatory Visit (INDEPENDENT_AMBULATORY_CARE_PROVIDER_SITE_OTHER): Payer: BC Managed Care – PPO | Admitting: Internal Medicine

## 2019-11-22 ENCOUNTER — Other Ambulatory Visit (HOSPITAL_COMMUNITY)
Admission: RE | Admit: 2019-11-22 | Discharge: 2019-11-22 | Disposition: A | Discharge status: Home / Self Care | Payer: BC Managed Care – PPO | Source: Ambulatory Visit | Attending: Internal Medicine | Admitting: Internal Medicine

## 2019-11-22 ENCOUNTER — Encounter: Payer: Self-pay | Admitting: Internal Medicine

## 2019-11-22 ENCOUNTER — Other Ambulatory Visit: Payer: Self-pay

## 2019-11-22 VITALS — BP 120/78 | HR 75 | Temp 97.5°F | Resp 16 | Ht 64.0 in | Wt 197.0 lb

## 2019-11-22 DIAGNOSIS — I25118 Atherosclerotic heart disease of native coronary artery with other forms of angina pectoris: Secondary | ICD-10-CM

## 2019-11-22 DIAGNOSIS — E119 Type 2 diabetes mellitus without complications: Secondary | ICD-10-CM

## 2019-11-22 DIAGNOSIS — E7800 Pure hypercholesterolemia, unspecified: Secondary | ICD-10-CM

## 2019-11-22 DIAGNOSIS — I10 Essential (primary) hypertension: Secondary | ICD-10-CM

## 2019-11-22 DIAGNOSIS — Z124 Encounter for screening for malignant neoplasm of cervix: Secondary | ICD-10-CM

## 2019-11-22 DIAGNOSIS — D649 Anemia, unspecified: Secondary | ICD-10-CM

## 2019-11-22 DIAGNOSIS — Z0000 Encounter for general adult medical examination without abnormal findings: Secondary | ICD-10-CM

## 2019-11-22 DIAGNOSIS — R059 Cough, unspecified: Secondary | ICD-10-CM

## 2019-11-22 DIAGNOSIS — Z23 Encounter for immunization: Secondary | ICD-10-CM | POA: Diagnosis not present

## 2019-11-22 DIAGNOSIS — R05 Cough: Secondary | ICD-10-CM

## 2019-11-22 DIAGNOSIS — G4730 Sleep apnea, unspecified: Secondary | ICD-10-CM

## 2019-11-22 DIAGNOSIS — K219 Gastro-esophageal reflux disease without esophagitis: Secondary | ICD-10-CM

## 2019-11-22 MED ORDER — ROSUVASTATIN CALCIUM 20 MG PO TABS: 20.0000 mg | ORAL_TABLET | Freq: Every day | ORAL | 3 refills | Status: DC

## 2019-11-22 NOTE — Assessment & Plan Note (Signed)
Physical today 11/22/19.  Colonoscopy

## 2019-11-22 NOTE — Progress Notes (Signed)
Patient ID: CORRINA STEFFENSEN, female   DOB: Jan 27, 1961, 58 y.o.   MRN: [id number]   Subjective:    Patient ID: MARITES NATH, female    DOB: Jan 02, 1961, 59 y.o.   MRN: [id number]  HPI This visit occurred during the SARS-CoV-2 public health emergency.  Safety protocols were in place, including screening questions prior to the visit, additional usage of staff PPE, and extensive cleaning of exam room while observing appropriate contact time as indicated for disinfecting solutions.  Patient here for her physical exam.  She is doing better.  Feels better.  Enjoying being married.  Seeing pulmonary.  Last seen 11/14/19 - chronic cough.  On flovent.  Recommended continuing flonase and zyrtec.  Cough is better.  No chest pain or sob.  She did fall recently.  Tripped over cat.  Left knee injured.  Is better.  Taking some antiinflammatories.  Some recent acid reflux.  On nexium bid.  Is helping.  No abdominal pain.  Bowels stable. Discussed covid vaccine.    Past Medical History:  Diagnosis Date  . Anemia    Noted 11/2011  . Anxiety   . Chest pain    Admitted 11/2011: cath with mild nonobstructive coronary plaque, normal EF, negative cardiac enzymes and negative d-dimer.  . Diabetes mellitus   . Fatty liver disease, nonalcoholic 1638  . GERD (gastroesophageal reflux disease)   . Hypercholesterolemia   . Hypertension   . Reflux   . TMJ dysfunction    Past Surgical History:  Procedure Laterality Date  . ABDOMINAL HYSTERECTOMY  2009   total  . BILATERAL SALPINGOOPHORECTOMY  2009   benign ovarian tumor  . HERNIA REPAIR     umbilical  . IMAGE GUIDED SINUS SURGERY N/A 10/22/2015   Procedure: IMAGE GUIDED SINUS SURGERY;  Surgeon: Carloyn Manner, MD;  Location: [facility] ORS;  Service: ENT;  Laterality: N/A;  . KNEE SURGERY Bilateral   . LEFT HEART CATHETERIZATION WITH CORONARY ANGIOGRAM N/A 12/09/2011   Procedure: LEFT HEART CATHETERIZATION WITH CORONARY ANGIOGRAM;  Surgeon: Minus Breeding, MD;  Location:  [facility] CATH LAB;  Service: Cardiovascular;  Laterality: N/A;  . SPHENOIDECTOMY Left 10/22/2015   Procedure: SPHENOIDECTOMY;  Surgeon: Carloyn Manner, MD;  Location: [facility] ORS;  Service: ENT;  Laterality: Left;  . SUBMANDIBULAR MASS EXCISION  1996   [facility]    Family History  Problem Relation Age of Onset  . Heart disease Father        Father had CABG in his 73s  . Colon polyps Father   . Lung cancer Mother 34  . Breast cancer Paternal Grandmother 26  . Arthritis/Rheumatoid Paternal Grandfather   . Breast cancer Paternal Aunt 21   Social History   Socioeconomic History  . Marital status: Married    Spouse name: Not on file  . Number of children: 2  . Years of education: Not on file  . Highest education level: Not on file  Occupational History  . Not on file  Tobacco Use  . Smoking status: Never Smoker  . Smokeless tobacco: Never Used  Vaping Use  . Vaping Use: Never used  Substance and Sexual Activity  . Alcohol use: No    Alcohol/week: 0.0 standard drinks  . Drug use: No  . Sexual activity: Not on file  Other Topics Concern  . Not on file  Social History Narrative  . Not on file   Social Determinants of Health   Financial Resource Strain:   . Difficulty of Paying Living Expenses:  Not on file  Food Insecurity:   . Worried About Charity fundraiser in the Last Year: Not on file  . Ran Out of Food in the Last Year: Not on file  Transportation Needs:   . Lack of Transportation (Medical): Not on file  . Lack of Transportation (Non-Medical): Not on file  Physical Activity:   . Days of Exercise per Week: Not on file  . Minutes of Exercise per Session: Not on file  Stress:   . Feeling of Stress : Not on file  Social Connections:   . Frequency of Communication with Friends and Family: Not on file  . Frequency of Social Gatherings with Friends and Family: Not on file  . Attends Religious Services: Not on file  . Active Member of Clubs or Organizations: Not on file  .  Attends Archivist Meetings: Not on file  . Marital Status: Not on file    Outpatient Encounter Medications as of 11/22/2019  Medication Sig  . acetaminophen (TYLENOL) 500 MG tablet Take 1,000 mg by mouth every 6 (six) hours as needed for mild pain.  Marland Kitchen acyclovir ointment (ZOVIRAX) 5 % Apply 1 application topically every 3 (three) hours.  Marland Kitchen albuterol (VENTOLIN HFA) 108 (90 Base) MCG/ACT inhaler Inhale 2 puffs into the lungs every 6 (six) hours as needed for wheezing or shortness of breath.  . Blood Glucose Monitoring Suppl (ONE TOUCH ULTRA 2) w/Device KIT U D TO CHECK BLOOD SUGARS  . cetirizine (ZYRTEC) 10 MG tablet Take 10 mg by mouth daily.  . cyclobenzaprine (FLEXERIL) 10 MG tablet Take 0.5-1 tablets (5-10 mg total) by mouth at bedtime as needed for muscle spasms.  Marland Kitchen escitalopram (LEXAPRO) 20 MG tablet TAKE 1 TABLET(20 MG) BY MOUTH DAILY  . esomeprazole (NEXIUM) 20 MG capsule Take 20 mg by mouth daily at 12 noon.  Marland Kitchen estradiol (ESTRACE) 1 MG tablet TAKE 1 TABLET(1 MG) BY MOUTH DAILY  . FARXIGA 10 MG TABS tablet TAKE 1 TABLET BY MOUTH EVERY MORNING  . Fe Fum-FePoly-Vit C-Vit B3 (INTEGRA) 62.5-62.5-40-3 MG CAPS TAKE 1 CAPSULE BY MOUTH TWICE DAILY  . fluconazole (DIFLUCAN) 150 MG tablet Take one tablet x 1.  If persistent symptoms after three days may repeat x 1.  . fluticasone (FLONASE) 50 MCG/ACT nasal spray Place 2 sprays into both nostrils daily.  . fluticasone (FLOVENT HFA) 110 MCG/ACT inhaler Inhale 2 puffs into the lungs in the morning and at bedtime.  Marland Kitchen glucose blood test strip three times before mills.  Marland Kitchen lisinopril (ZESTRIL) 20 MG tablet Take 1 tablet (20 mg total) by mouth daily.  . Melatonin 10 MG TABS Take 10 mg by mouth at bedtime as needed (sleep).  . meloxicam (MOBIC) 15 MG tablet Take 15 mg by mouth daily.  . metFORMIN (GLUCOPHAGE) 1000 MG tablet Take 1 tablet (1,000 mg total) by mouth 2 (two) times daily with a meal.  . mupirocin ointment (BACTROBAN) 2 % Place 1  application into the nose 2 (two) times daily.  . Probiotic Product (PHILLIPS COLON HEALTH PO) Take 1 tablet by mouth daily as needed (colon health).   . rosuvastatin (CRESTOR) 20 MG tablet Take 1 tablet (20 mg total) by mouth daily.  . TRULICITY 1.5 AX/6.5VV SOPN inject 1.5 milligrams subcutaneously every 7 days  . valACYclovir (VALTREX) 1000 MG tablet Take 1 tablet (1,000 mg total) by mouth 2 (two) times daily.  . [DISCONTINUED] pravastatin (PRAVACHOL) 40 MG tablet TAKE 1 TABLET(40 MG) BY MOUTH DAILY  No facility-administered encounter medications on file as of 11/22/2019.    Review of Systems  Constitutional: Negative for appetite change and unexpected weight change.  HENT: Negative for congestion and sinus pressure.   Eyes: Negative for pain and visual disturbance.  Respiratory: Negative for chest tightness and shortness of breath.        Cough is better.   Cardiovascular: Negative for chest pain, palpitations and leg swelling.  Gastrointestinal: Negative for abdominal pain, diarrhea, nausea and vomiting.       Some acid reflux as outlined.    Genitourinary: Negative for difficulty urinating and dysuria.  Musculoskeletal: Negative for joint swelling and myalgias.  Skin: Negative for color change and rash.  Neurological: Negative for dizziness, light-headedness and headaches.  Hematological: Negative for adenopathy. Does not bruise/bleed easily.  Psychiatric/Behavioral: Negative for agitation and dysphoric mood.       Objective:    Physical Exam Vitals reviewed.  Constitutional:      General: She is not in acute distress.    Appearance: Normal appearance. She is well-developed.  HENT:     Head: Normocephalic and atraumatic.     Right Ear: External ear normal.     Left Ear: External ear normal.  Eyes:     General: No scleral icterus.       Right eye: No discharge.        Left eye: No discharge.     Conjunctiva/sclera: Conjunctivae normal.  Neck:     Thyroid: No  thyromegaly.  Cardiovascular:     Rate and Rhythm: Normal rate and regular rhythm.  Pulmonary:     Effort: No tachypnea, accessory muscle usage or respiratory distress.     Breath sounds: Normal breath sounds. No decreased breath sounds or wheezing.  Chest:     Breasts:        Right: No inverted nipple, mass, nipple discharge or tenderness (no axillary adenopathy).        Left: No inverted nipple, mass, nipple discharge or tenderness (no axilarry adenopathy).  Abdominal:     General: Bowel sounds are normal.     Palpations: Abdomen is soft.     Tenderness: There is no abdominal tenderness.  Genitourinary:    Comments: Normal external genitalia.  Vaginal vault without lesions.  S/p hysterectomy.  Pap smear performed.  Could not appreciate any adnexal masses or tenderness.   Musculoskeletal:        General: No swelling or tenderness.     Cervical back: Neck supple. No tenderness.  Lymphadenopathy:     Cervical: No cervical adenopathy.  Skin:    Findings: No erythema or rash.  Neurological:     Mental Status: She is alert and oriented to person, place, and time.  Psychiatric:        Mood and Affect: Mood normal.        Behavior: Behavior normal.     BP 120/78   Pulse 75   Temp (!) 97.5 F (36.4 C)   Resp 16   Ht 5' 4"  (1.626 m)   Wt 197 lb (89.4 kg)   SpO2 98%   BMI 33.81 kg/m  Wt Readings from Last 3 Encounters:  11/22/19 197 lb (89.4 kg)  11/14/19 196 lb (88.9 kg)  08/19/19 199 lb (90.3 kg)     Lab Results  Component Value Date   WBC 10.0 08/15/2019   HGB 13.6 08/15/2019   HCT 41.7 08/15/2019   PLT 211.0 08/15/2019   GLUCOSE 106 (H) 11/20/2019  CHOL 185 11/20/2019   TRIG 114.0 11/20/2019   HDL 62.20 11/20/2019   LDLDIRECT 93.0 11/07/2018   LDLCALC 100 (H) 11/20/2019   ALT 11 11/20/2019   AST 10 11/20/2019   NA 141 11/20/2019   K 4.2 11/20/2019   CL 101 11/20/2019   CREATININE 0.58 11/20/2019   BUN 16 11/20/2019   CO2 33 (H) 11/20/2019   TSH 1.73  04/11/2019   HGBA1C 6.5 11/20/2019   MICROALBUR 0.7 04/11/2019    US BREAST LTD UNI LEFT INC AXILLA  Result Date: 05/13/2019 CLINICAL DATA:  59 year old patient presents for six-month follow-up of probably benign findings in the left breast. EXAM: DIGITAL DIAGNOSTIC LEFT MAMMOGRAM WITH CAD AND TOMO ULTRASOUND LEFT BREAST COMPARISON:  November 09, 2018 and November 01, 2018 ACR Breast Density Category b: There are scattered areas of fibroglandular density. FINDINGS: No mass, distortion, or suspicious microcalcification is identified in the left breast to suggest malignancy. Specifically, the previously identified 4 mm low-density circumscribed mass in the far posterior medial left breast is not identified on today's mammogram. Mammographic images were processed with CAD. Targeted ultrasound is performed, showing normal fibroglandular tissue in the 8 o'clock axis of the left breast. No mass is identified on ultrasound in this region. IMPRESSION: No evidence of malignancy in the left breast. The previously visualized probably benign mass in the 8 o'clock region is not identified today, suggesting interval resolution of a benign process. RECOMMENDATION: Bilateral screening mammogram is recommended in August 2021. I have discussed the findings and recommendations with the patient. If applicable, a reminder letter will be sent to the patient regarding the next appointment. BI-RADS CATEGORY  1: Negative. Electronically Signed   By: Curlene Dolphin M.D.   On: 05/13/2019 14:31   MM DIAG BREAST TOMO UNI LEFT  Result Date: 05/13/2019 CLINICAL DATA:  59 year old patient presents for six-month follow-up of probably benign findings in the left breast. EXAM: DIGITAL DIAGNOSTIC LEFT MAMMOGRAM WITH CAD AND TOMO ULTRASOUND LEFT BREAST COMPARISON:  November 09, 2018 and November 01, 2018 ACR Breast Density Category b: There are scattered areas of fibroglandular density. FINDINGS: No mass, distortion, or suspicious microcalcification is  identified in the left breast to suggest malignancy. Specifically, the previously identified 4 mm low-density circumscribed mass in the far posterior medial left breast is not identified on today's mammogram. Mammographic images were processed with CAD. Targeted ultrasound is performed, showing normal fibroglandular tissue in the 8 o'clock axis of the left breast. No mass is identified on ultrasound in this region. IMPRESSION: No evidence of malignancy in the left breast. The previously visualized probably benign mass in the 8 o'clock region is not identified today, suggesting interval resolution of a benign process. RECOMMENDATION: Bilateral screening mammogram is recommended in August 2021. I have discussed the findings and recommendations with the patient. If applicable, a reminder letter will be sent to the patient regarding the next appointment. BI-RADS CATEGORY  1: Negative. Electronically Signed   By: Curlene Dolphin M.D.   On: 05/13/2019 14:31       Assessment & Plan:   Problem List Items Addressed This Visit    Sleep apnea    CPAP.       Hypertension    Blood pressure doing well.  Continue lisinopril.  Follow pressures.  Follow metabolic panel.       Relevant Medications   rosuvastatin (CRESTOR) 20 MG tablet   Hypercholesterolemia    On pravastatin.  Low cholesterol diet and exercise.  Given LDL not at  goal, will change to crestor 63m q day.  Check liver panel 6 weeks after changing medication.        Relevant Medications   rosuvastatin (CRESTOR) 20 MG tablet   Other Relevant Orders   Hepatic function panel   Health care maintenance    Physical today 11/22/19.  Colonoscopy       GERD (gastroesophageal reflux disease)    On nexium bid.  Acid reflux doing better.  Follow.        Diabetes mellitus ([facility]    Low carb diet and exercise.  Continues on farxiga, trulicity and metformin.  Follow met b and a1c.   Lab Results  Component Value Date   HGBA1C 6.5 11/20/2019         Relevant Medications   rosuvastatin (CRESTOR) 20 MG tablet   Cough    Seeing pulmonary.  Using flovent.  Cough is better.  Follow.        CAD (coronary artery disease)    Continue risk factor modification.  Currently asymptomatic.        Relevant Medications   rosuvastatin (CRESTOR) 20 MG tablet   Anemia    Follow cbc.        Other Visit Diagnoses    Routine general medical examination at a health care facility    -  Primary   Cervical cancer screening       Relevant Orders   Cytology - PAP( [facility])   Need for immunization against influenza       Relevant Orders   Flu Vaccine QUAD 36+ mos IM (Completed)       CEinar Pheasant MD

## 2019-11-23 ENCOUNTER — Encounter: Payer: Self-pay | Admitting: Internal Medicine

## 2019-11-23 NOTE — Assessment & Plan Note (Signed)
Low carb diet and exercise.  Continues on farxiga, trulicity and metformin.  Follow met b and a1c.   Lab Results  Component Value Date   HGBA1C 6.5 11/20/2019   

## 2019-11-23 NOTE — Assessment & Plan Note (Signed)
CPAP.  

## 2019-11-23 NOTE — Assessment & Plan Note (Signed)
On nexium bid.  Acid reflux doing better.  Follow.

## 2019-11-23 NOTE — Assessment & Plan Note (Signed)
Seeing pulmonary.  Using flovent.  Cough is better.  Follow.

## 2019-11-23 NOTE — Assessment & Plan Note (Signed)
Continue risk factor modification.  Currently asymptomatic.  

## 2019-11-23 NOTE — Assessment & Plan Note (Addendum)
On pravastatin.  Low cholesterol diet and exercise.  Given LDL not at goal, will change to crestor 20mg  q day.  Check liver panel 6 weeks after changing medication.

## 2019-11-23 NOTE — Assessment & Plan Note (Signed)
Blood pressure doing well.  Continue lisinopril.  Follow pressures.  Follow metabolic panel.  

## 2019-11-23 NOTE — Assessment & Plan Note (Signed)
Follow cbc.  

## 2019-11-26 LAB — CYTOLOGY - PAP
Adequacy: ABSENT
Comment: NEGATIVE
Diagnosis: NEGATIVE
High risk HPV: NEGATIVE

## 2019-12-02 ENCOUNTER — Other Ambulatory Visit: Payer: Self-pay | Admitting: Internal Medicine

## 2019-12-05 ENCOUNTER — Other Ambulatory Visit: Payer: Self-pay | Admitting: Internal Medicine

## 2019-12-13 ENCOUNTER — Other Ambulatory Visit (HOSPITAL_COMMUNITY)
Admission: RE | Admit: 2019-12-13 | Discharge: 2019-12-13 | Disposition: A | Discharge status: Home / Self Care | Payer: BC Managed Care – PPO | Source: Ambulatory Visit | Attending: Pulmonary Disease | Admitting: Pulmonary Disease

## 2019-12-13 DIAGNOSIS — Z01812 Encounter for preprocedural laboratory examination: Secondary | ICD-10-CM | POA: Insufficient documentation

## 2019-12-13 DIAGNOSIS — Z20822 Contact with and (suspected) exposure to covid-19: Secondary | ICD-10-CM | POA: Diagnosis not present

## 2019-12-13 LAB — SARS CORONAVIRUS 2 (TAT 6-24 HRS): SARS Coronavirus 2: NEGATIVE

## 2019-12-16 ENCOUNTER — Ambulatory Visit (INDEPENDENT_AMBULATORY_CARE_PROVIDER_SITE_OTHER): Payer: BC Managed Care – PPO | Admitting: Adult Health

## 2019-12-16 ENCOUNTER — Other Ambulatory Visit: Payer: Self-pay

## 2019-12-16 ENCOUNTER — Encounter: Payer: Self-pay | Admitting: Adult Health

## 2019-12-16 ENCOUNTER — Ambulatory Visit (INDEPENDENT_AMBULATORY_CARE_PROVIDER_SITE_OTHER): Payer: BC Managed Care – PPO | Admitting: Pulmonary Disease

## 2019-12-16 DIAGNOSIS — J4530 Mild persistent asthma, uncomplicated: Secondary | ICD-10-CM

## 2019-12-16 DIAGNOSIS — R053 Chronic cough: Secondary | ICD-10-CM

## 2019-12-16 DIAGNOSIS — J45909 Unspecified asthma, uncomplicated: Secondary | ICD-10-CM | POA: Insufficient documentation

## 2019-12-16 LAB — PULMONARY FUNCTION TEST
DL/VA % pred: 142 %
DL/VA: 6.02 ml/min/mmHg/L
DLCO cor % pred: 126 %
DLCO cor: 25.23 ml/min/mmHg
DLCO unc % pred: 126 %
DLCO unc: 25.23 ml/min/mmHg
FEF 25-75 Post: 4.54 L/sec
FEF 25-75 Pre: 4.59 L/sec
FEF2575-%Change-Post: -1 %
FEF2575-%Pred-Post: 195 %
FEF2575-%Pred-Pre: 197 %
FEV1-%Change-Post: -1 %
FEV1-%Pred-Post: 102 %
FEV1-%Pred-Pre: 103 %
FEV1-Post: 2.57 L
FEV1-Pre: 2.6 L
FEV1FVC-%Change-Post: -1 %
FEV1FVC-%Pred-Pre: 118 %
FEV6-%Change-Post: 0 %
FEV6-%Pred-Post: 90 %
FEV6-%Pred-Pre: 89 %
FEV6-Post: 2.82 L
FEV6-Pre: 2.81 L
FEV6FVC-%Pred-Post: 103 %
FEV6FVC-%Pred-Pre: 103 %
FVC-%Change-Post: 0 %
FVC-%Pred-Post: 86 %
FVC-%Pred-Pre: 86 %
FVC-Post: 2.82 L
FVC-Pre: 2.81 L
Post FEV1/FVC ratio: 91 %
Post FEV6/FVC ratio: 100 %
Pre FEV1/FVC ratio: 93 %
Pre FEV6/FVC Ratio: 100 %
RV % pred: 85 %
RV: 1.68 L
TLC % pred: 91 %
TLC: 4.54 L

## 2019-12-16 NOTE — Progress Notes (Signed)
@Patient  ID: Evelyn James, female    DOB: 1960-08-20, 59 y.o.   MRN: [id number]  Chief Complaint  Patient presents with  . Follow-up    Cough     Referring provider: Einar Pheasant, MD  HPI: 59 year old female never smoker seen for pulmonary consult November 14, 2019 for cough x6 months after COVID-19 Medical history significant for obstructive sleep apnea on nocturnal CPAP  TEST/EVENTS :   12/16/2019 Follow up : Cough  Patient returns for a 1 month follow-up.  Patient was seen last visit for a pulmonary consult for ongoing cough x6 months after having COVID-19 in February 2021.  Patient had a slow to resolve Covid infection at home.  She did not require hospitalization or oxygen or any specific treatment for COVID-19.  She says that she did recover but continued to have an ongoing cough after Covid.  She says since her last visit she is doing better.  Her cough has decreased and her breathing seems to be getting more like her baseline.  She does have some occasional coughing episodes.  She denies any increased shortness of breath.  She says she does have some allergy symptoms.  She is on Flonase and Zyrtec daily.  Patient had pulmonary function testing today that showed normal lung function with no airflow obstruction or restriction.  FEV1 was 102%, ratio 91, FVC 86%.  No significant bronchodilator response.,  DLCO 126%. Patient is on ACE inhibitor.  Allergies  Allergen Reactions  . Sulfa Antibiotics Other (See Comments)    Topical--red itchy; oral--makes mouth raw Bleeding sores in mouth. Raw mouth and sores on gums     Immunization History  Administered Date(s) Administered  . Influenza Whole 12/30/2011  . Influenza,inj,Quad PF,6+ Mos 12/06/2012, 11/14/2013, 11/26/2015, 12/06/2016, 11/13/2018, 11/22/2019  . Influenza-Unspecified 12/31/2014, 12/12/2016, 12/02/2017, 02/17/2019  . Pneumococcal Polysaccharide-23 09/12/2017  . Tdap 11/05/2012  . Zoster Recombinat (Shingrix)  09/15/2016, 02/08/2017    Past Medical History:  Diagnosis Date  . Anemia    Noted 11/2011  . Anxiety   . Chest pain    Admitted 11/2011: cath with mild nonobstructive coronary plaque, normal EF, negative cardiac enzymes and negative d-dimer.  . Diabetes mellitus   . Fatty liver disease, nonalcoholic 9379  . GERD (gastroesophageal reflux disease)   . Hypercholesterolemia   . Hypertension   . Reflux   . TMJ dysfunction     Tobacco History: Social History   Tobacco Use  Smoking Status Never Smoker  Smokeless Tobacco Never Used   Counseling given: Not Answered   Outpatient Medications Prior to Visit  Medication Sig Dispense Refill  . acetaminophen (TYLENOL) 500 MG tablet Take 1,000 mg by mouth every 6 (six) hours as needed for mild pain.    Marland Kitchen acyclovir ointment (ZOVIRAX) 5 % Apply 1 application topically every 3 (three) hours. 5 g 2  . albuterol (VENTOLIN HFA) 108 (90 Base) MCG/ACT inhaler Inhale 2 puffs into the lungs every 6 (six) hours as needed for wheezing or shortness of breath. 8 g 6  . Blood Glucose Monitoring Suppl (ONE TOUCH ULTRA 2) w/Device KIT U D TO CHECK BLOOD SUGARS  0  . cetirizine (ZYRTEC) 10 MG tablet Take 10 mg by mouth daily.    . cyclobenzaprine (FLEXERIL) 10 MG tablet Take 0.5-1 tablets (5-10 mg total) by mouth at bedtime as needed for muscle spasms. 30 tablet 0  . escitalopram (LEXAPRO) 20 MG tablet TAKE 1 TABLET(20 MG) BY MOUTH DAILY 90 tablet 1  . esomeprazole (  NEXIUM) 20 MG capsule Take 20 mg by mouth daily at 12 noon.    Marland Kitchen estradiol (ESTRACE) 1 MG tablet TAKE 1 TABLET(1 MG) BY MOUTH DAILY 30 tablet 2  . FARXIGA 10 MG TABS tablet TAKE 1 TABLET BY MOUTH EVERY MORNING 90 tablet 1  . Fe Fum-FePoly-Vit C-Vit B3 (INTEGRA) 62.5-62.5-40-3 MG CAPS TAKE 1 CAPSULE BY MOUTH TWICE DAILY 180 capsule 1  . fluconazole (DIFLUCAN) 150 MG tablet Take one tablet x 1.  If persistent symptoms after three days may repeat x 1. 2 tablet 0  . fluticasone (FLONASE) 50 MCG/ACT  nasal spray Place 2 sprays into both nostrils daily. 16 g 5  . fluticasone (FLOVENT HFA) 110 MCG/ACT inhaler Inhale 2 puffs into the lungs in the morning and at bedtime. 1 each 12  . glucose blood test strip three times before mills. 100 each 12  . lisinopril (ZESTRIL) 20 MG tablet TAKE 1 TABLET(20 MG) BY MOUTH DAILY 90 tablet 1  . Melatonin 10 MG TABS Take 10 mg by mouth at bedtime as needed (sleep).    . meloxicam (MOBIC) 15 MG tablet Take 15 mg by mouth daily.  3  . metFORMIN (GLUCOPHAGE) 1000 MG tablet Take 1 tablet (1,000 mg total) by mouth 2 (two) times daily with a meal. 180 tablet 1  . mupirocin ointment (BACTROBAN) 2 % Place 1 application into the nose 2 (two) times daily. 22 g 0  . Probiotic Product (PHILLIPS COLON HEALTH PO) Take 1 tablet by mouth daily as needed (colon health).     . rosuvastatin (CRESTOR) 20 MG tablet Take 1 tablet (20 mg total) by mouth daily. 30 tablet 3  . TRULICITY 1.5 GG/2.6RS SOPN inject 1.5 milligrams subcutaneously every 7 days 3 pen 4  . valACYclovir (VALTREX) 1000 MG tablet Take 1 tablet (1,000 mg total) by mouth 2 (two) times daily. 21 tablet 0   No facility-administered medications prior to visit.     Review of Systems:   Constitutional:   No  weight loss, night sweats,  Fevers, chills, fatigue, or  lassitude.  HEENT:   No headaches,  Difficulty swallowing,  Tooth/dental problems, or  Sore throat,                No sneezing, itching, ear ache,  +nasal congestion, post nasal drip,   CV:  No chest pain,  Orthopnea, PND, swelling in lower extremities, anasarca, dizziness, palpitations, syncope.   GI  No heartburn, indigestion, abdominal pain, nausea, vomiting, diarrhea, change in bowel habits, loss of appetite, bloody stools.   Resp:   No chest wall deformity  Skin: no rash or lesions.  GU: no dysuria, change in color of urine, no urgency or frequency.  No flank pain, no hematuria   MS:  No joint pain or swelling.  No decreased range of  motion.  No back pain.    Physical Exam  BP 122/72 (BP Location: Left Arm, Cuff Size: Normal)   Pulse 86   Temp (!) 97.5 F (36.4 C) (Oral)   Ht 5' 3"  (1.6 m)   Wt 196 lb 3.2 oz (89 kg)   SpO2 98%   BMI 34.76 kg/m   GEN: A/Ox3; pleasant , NAD, well nourished    HEENT:  [place]/AT,    NOSE-clear, THROAT-clear, no lesions, no postnasal drip or exudate noted.   NECK:  Supple w/ fair ROM; no JVD; normal carotid impulses w/o bruits; no thyromegaly or nodules palpated; no lymphadenopathy.    RESP  Clear  P & A; w/o, wheezes/ rales/ or rhonchi. no accessory muscle use, no dullness to percussion  CARD:  RRR, no m/r/g, no peripheral edema, pulses intact, no cyanosis or clubbing.  GI:   Soft & nt; nml bowel sounds; no organomegaly or masses detected.   Musco: Warm bil, no deformities or joint swelling noted.   Neuro: alert, no focal deficits noted.    Skin: Warm, no lesions or rashes    Lab Results:  CBC   BNP No results found for: BNP  ProBNP No results found for: PROBNP  Imaging: No results found.    PFT Results Latest Ref Rng & Units 12/16/2019  FVC-Pre L 2.81  FVC-Predicted Pre % 86  FVC-Post L 2.82  FVC-Predicted Post % 86  Pre FEV1/FVC % % 93  Post FEV1/FCV % % 91  FEV1-Pre L 2.60  FEV1-Predicted Pre % 103  FEV1-Post L 2.57  DLCO uncorrected ml/min/mmHg 25.23  DLCO UNC% % 126  DLCO corrected ml/min/mmHg 25.23  DLCO COR %Predicted % 126  DLVA Predicted % 142  TLC L 4.54  TLC % Predicted % 91  RV % Predicted % 85    No results found for: NITRICOXIDE      Assessment & Plan:   Asthma Mild persistent asthma-patient has had clinical benefit with Flovent.  Pulmonary function testing shows normal lung function.  We will continue on current regimen.  Continue on trigger prevention and cough control.  Patient's cough is substantially improved.  If she continues to have some ongoing cough or flare of her cough may need to consider changing her ACE  inhibitor  Plan  Patient Instructions  Continue on Flovent 2 puffs Twice daily  , rinse after use.  Zyrtec 51m daily  Albuterol inhaler 1-2 puffs every 4-6 hrs as needed.  Delsym 2 tsp Twice daily  As needed  Cough.  Discuss with Primary provider that Lisinopril may aggravate your cough .  Follow up with Dr. SHalford Chessman In 4 months and As needed           TRexene Edison NP 12/16/2019

## 2019-12-16 NOTE — Progress Notes (Signed)
PFT done today. 

## 2019-12-16 NOTE — Patient Instructions (Addendum)
Continue on Flovent 2 puffs Twice daily  , rinse after use.  Zyrtec 10mg  daily  Albuterol inhaler 1-2 puffs every 4-6 hrs as needed.  Delsym 2 tsp Twice daily  As needed  Cough.  Discuss with Primary provider that Lisinopril may aggravate your cough .  Follow up with Dr.  In 4 months and As needed

## 2019-12-16 NOTE — Assessment & Plan Note (Signed)
Mild persistent asthma-patient has had clinical benefit with Flovent.  Pulmonary function testing shows normal lung function.  We will continue on current regimen.  Continue on trigger prevention and cough control.  Patient's cough is substantially improved.  If she continues to have some ongoing cough or flare of her cough may need to consider changing her ACE inhibitor  Plan  Patient Instructions  Continue on Flovent 2 puffs Twice daily  , rinse after use.  Zyrtec 10mg  daily  Albuterol inhaler 1-2 puffs every 4-6 hrs as needed.  Delsym 2 tsp Twice daily  As needed  Cough.  Discuss with Primary provider that Lisinopril may aggravate your cough .  Follow up with Dr.  In 4 months and As needed

## 2019-12-16 NOTE — Progress Notes (Signed)
Reviewed and agree with assessment/plan.   Aloys Hupfer, MD [facility] 12/16/2019, 5:17 PM Pager:  [phone]  

## 2019-12-17 ENCOUNTER — Ambulatory Visit (INDEPENDENT_AMBULATORY_CARE_PROVIDER_SITE_OTHER): Payer: BC Managed Care – PPO | Admitting: Nurse Practitioner

## 2019-12-17 ENCOUNTER — Other Ambulatory Visit: Payer: Self-pay

## 2019-12-17 ENCOUNTER — Encounter: Payer: Self-pay | Admitting: Nurse Practitioner

## 2019-12-17 VITALS — BP 110/78 | HR 70 | Temp 97.8°F | Resp 16 | Wt 196.0 lb

## 2019-12-17 DIAGNOSIS — E1169 Type 2 diabetes mellitus with other specified complication: Secondary | ICD-10-CM | POA: Diagnosis not present

## 2019-12-17 DIAGNOSIS — L02224 Furuncle of groin: Secondary | ICD-10-CM | POA: Insufficient documentation

## 2019-12-17 MED ORDER — SACCHAROMYCES BOULARDII 250 MG PO CAPS: 250.0000 mg | ORAL_CAPSULE | Freq: Two times a day (BID) | ORAL | 0 refills | Status: AC

## 2019-12-17 MED ORDER — DOXYCYCLINE HYCLATE 100 MG PO TABS: 100.0000 mg | ORAL_TABLET | Freq: Two times a day (BID) | ORAL | 0 refills | Status: DC

## 2019-12-17 NOTE — Progress Notes (Signed)
Established Patient Office Visit  Subjective:  Patient ID: Evelyn James, female    DOB: 10/27/1960  Age: 59 y.o. MRN: [id number]  CC:  Chief Complaint  Patient presents with  . Acute Visit    sore in pubic area    HPI Evelyn James is a 59 yo who presents for a sore that she found on her mons pubis Saturday. It is not tender and has not been draining. No fever/chills. Not very painful- but she can tell it is there. No injury or trauma to the area. No hx of skin boils.   Past Medical History:  Diagnosis Date  . Anemia    Noted 11/2011  . Anxiety   . Chest pain    Admitted 11/2011: cath with mild nonobstructive coronary plaque, normal EF, negative cardiac enzymes and negative d-dimer.  . Diabetes mellitus   . Fatty liver disease, nonalcoholic 5035  . GERD (gastroesophageal reflux disease)   . Hypercholesterolemia   . Hypertension   . Reflux   . TMJ dysfunction     Past Surgical History:  Procedure Laterality Date  . ABDOMINAL HYSTERECTOMY  2009   total  . BILATERAL SALPINGOOPHORECTOMY  2009   benign ovarian tumor  . HERNIA REPAIR     umbilical  . IMAGE GUIDED SINUS SURGERY N/A 10/22/2015   Procedure: IMAGE GUIDED SINUS SURGERY;  Surgeon: Carloyn Manner, MD;  Location: [facility] ORS;  Service: ENT;  Laterality: N/A;  . KNEE SURGERY Bilateral   . LEFT HEART CATHETERIZATION WITH CORONARY ANGIOGRAM N/A 12/09/2011   Procedure: LEFT HEART CATHETERIZATION WITH CORONARY ANGIOGRAM;  Surgeon: Minus Breeding, MD;  Location: [facility] CATH LAB;  Service: Cardiovascular;  Laterality: N/A;  . SPHENOIDECTOMY Left 10/22/2015   Procedure: SPHENOIDECTOMY;  Surgeon: Carloyn Manner, MD;  Location: [facility] ORS;  Service: ENT;  Laterality: Left;  . SUBMANDIBULAR MASS EXCISION  1996   [facility]     Family History  Problem Relation Age of Onset  . Heart disease Father        Father had CABG in his 69s  . Colon polyps Father   . Lung cancer Mother 82  . Breast cancer Paternal Grandmother 56  .  Arthritis/Rheumatoid Paternal Grandfather   . Breast cancer Paternal Aunt 63    Social History   Socioeconomic History  . Marital status: Married    Spouse name: Not on file  . Number of children: 2  . Years of education: Not on file  . Highest education level: Not on file  Occupational History  . Not on file  Tobacco Use  . Smoking status: Never Smoker  . Smokeless tobacco: Never Used  Vaping Use  . Vaping Use: Never used  Substance and Sexual Activity  . Alcohol use: No    Alcohol/week: 0.0 standard drinks  . Drug use: No  . Sexual activity: Not on file  Other Topics Concern  . Not on file  Social History Narrative  . Not on file   Social Determinants of Health   Financial Resource Strain:   . Difficulty of Paying Living Expenses: Not on file  Food Insecurity:   . Worried About Charity fundraiser in the Last Year: Not on file  . Ran Out of Food in the Last Year: Not on file  Transportation Needs:   . Lack of Transportation (Medical): Not on file  . Lack of Transportation (Non-Medical): Not on file  Physical Activity:   . Days of Exercise per Week: Not on  file  . Minutes of Exercise per Session: Not on file  Stress:   . Feeling of Stress : Not on file  Social Connections:   . Frequency of Communication with Friends and Family: Not on file  . Frequency of Social Gatherings with Friends and Family: Not on file  . Attends Religious Services: Not on file  . Active Member of Clubs or Organizations: Not on file  . Attends Archivist Meetings: Not on file  . Marital Status: Not on file  Intimate Partner Violence:   . Fear of Current or Ex-Partner: Not on file  . Emotionally Abused: Not on file  . Physically Abused: Not on file  . Sexually Abused: Not on file    Outpatient Medications Prior to Visit  Medication Sig Dispense Refill  . acetaminophen (TYLENOL) 500 MG tablet Take 1,000 mg by mouth every 6 (six) hours as needed for mild pain.    Marland Kitchen acyclovir  ointment (ZOVIRAX) 5 % Apply 1 application topically every 3 (three) hours. 5 g 2  . albuterol (VENTOLIN HFA) 108 (90 Base) MCG/ACT inhaler Inhale 2 puffs into the lungs every 6 (six) hours as needed for wheezing or shortness of breath. 8 g 6  . Blood Glucose Monitoring Suppl (ONE TOUCH ULTRA 2) w/Device KIT U D TO CHECK BLOOD SUGARS  0  . cetirizine (ZYRTEC) 10 MG tablet Take 10 mg by mouth daily.    . cyclobenzaprine (FLEXERIL) 10 MG tablet Take 0.5-1 tablets (5-10 mg total) by mouth at bedtime as needed for muscle spasms. 30 tablet 0  . escitalopram (LEXAPRO) 20 MG tablet TAKE 1 TABLET(20 MG) BY MOUTH DAILY 90 tablet 1  . esomeprazole (NEXIUM) 20 MG capsule Take 20 mg by mouth daily at 12 noon.    Marland Kitchen estradiol (ESTRACE) 1 MG tablet TAKE 1 TABLET(1 MG) BY MOUTH DAILY 30 tablet 2  . FARXIGA 10 MG TABS tablet TAKE 1 TABLET BY MOUTH EVERY MORNING 90 tablet 1  . Fe Fum-FePoly-Vit C-Vit B3 (INTEGRA) 62.5-62.5-40-3 MG CAPS TAKE 1 CAPSULE BY MOUTH TWICE DAILY 180 capsule 1  . fluconazole (DIFLUCAN) 150 MG tablet Take one tablet x 1.  If persistent symptoms after three days may repeat x 1. 2 tablet 0  . fluticasone (FLONASE) 50 MCG/ACT nasal spray Place 2 sprays into both nostrils daily. 16 g 5  . fluticasone (FLOVENT HFA) 110 MCG/ACT inhaler Inhale 2 puffs into the lungs in the morning and at bedtime. 1 each 12  . glucose blood test strip three times before Roshawn Ayala. 100 each 12  . lisinopril (ZESTRIL) 20 MG tablet TAKE 1 TABLET(20 MG) BY MOUTH DAILY 90 tablet 1  . Melatonin 10 MG TABS Take 10 mg by mouth at bedtime as needed (sleep).    . meloxicam (MOBIC) 15 MG tablet Take 15 mg by mouth daily.  3  . metFORMIN (GLUCOPHAGE) 1000 MG tablet Take 1 tablet (1,000 mg total) by mouth 2 (two) times daily with a meal. 180 tablet 1  . mupirocin ointment (BACTROBAN) 2 % Place 1 application into the nose 2 (two) times daily. 22 g 0  . Probiotic Product (PHILLIPS COLON HEALTH PO) Take 1 tablet by mouth daily as  needed (colon health).     . rosuvastatin (CRESTOR) 20 MG tablet Take 1 tablet (20 mg total) by mouth daily. 30 tablet 3  . TRULICITY 1.5 EX/5.2WU SOPN inject 1.5 milligrams subcutaneously every 7 days 3 pen 4  . valACYclovir (VALTREX) 1000 MG tablet Take 1  tablet (1,000 mg total) by mouth 2 (two) times daily. 21 tablet 0   No facility-administered medications prior to visit.    Allergies  Allergen Reactions  . Sulfa Antibiotics Other (See Comments)    Topical--red itchy; oral--makes mouth raw Bleeding sores in mouth. Raw mouth and sores on gums     Review of Systems  Constitutional: Negative for chills and fever.  HENT: Negative.   Respiratory: Negative.   Cardiovascular: Negative.   Gastrointestinal: Negative.   Genitourinary: Negative.   Skin: Positive for rash.  Hematological: Negative for adenopathy. Does not bruise/bleed easily.      Objective:    Physical Exam Vitals reviewed.  Constitutional:      Appearance: She is obese.  HENT:     Head: Normocephalic.  Cardiovascular:     Rate and Rhythm: Normal rate.     Pulses: Normal pulses.  Pulmonary:     Effort: Pulmonary effort is normal.  Abdominal:     Palpations: Abdomen is soft.     Tenderness: There is no abdominal tenderness. There is no guarding.  Musculoskeletal:        General: Normal range of motion.     Cervical back: Normal range of motion and neck supple.  Skin:    General: Skin is warm and dry.     Comments: She has a red/purple skin lesion on a base of erythem located mid line of mons pubis. There is no warmth, or tenderness with palpation. Soft edges, but if pinched gently, there is mild induration to this . Suspect a boil. Surrounding lymph nodes are normal.   Neurological:     General: No focal deficit present.     Mental Status: She is alert and oriented to person, place, and time.  Psychiatric:        Mood and Affect: Mood normal.        Behavior: Behavior normal.      BP 110/78    Pulse 70   Temp 97.8 F (36.6 C) (Oral)   Resp 16   Wt 196 lb (88.9 kg)   SpO2 97%   BMI 34.72 kg/m  Wt Readings from Last 3 Encounters:  12/17/19 196 lb (88.9 kg)  12/16/19 196 lb 3.2 oz (89 kg)  11/22/19 197 lb (89.4 kg)   Pulse Readings from Last 3 Encounters:  12/17/19 70  12/16/19 86  11/22/19 75    BP Readings from Last 3 Encounters:  12/17/19 110/78  12/16/19 122/72  11/22/19 120/78    Lab Results  Component Value Date   CHOL 185 11/20/2019   HDL 62.20 11/20/2019   LDLCALC 100 (H) 11/20/2019   LDLDIRECT 93.0 11/07/2018   TRIG 114.0 11/20/2019   CHOLHDL 3 11/20/2019      Health Maintenance Due  Topic Date Due  . HIV Screening  Never done  . OPHTHALMOLOGY EXAM  09/25/2012    There are no preventive care reminders to display for this patient.  Lab Results  Component Value Date   TSH 1.73 04/11/2019   Lab Results  Component Value Date   WBC 10.0 08/15/2019   HGB 13.6 08/15/2019   HCT 41.7 08/15/2019   MCV 89.0 08/15/2019   PLT 211.0 08/15/2019   Lab Results  Component Value Date   NA 141 11/20/2019   K 4.2 11/20/2019   CO2 33 (H) 11/20/2019   GLUCOSE 106 (H) 11/20/2019   BUN 16 11/20/2019   CREATININE 0.58 11/20/2019   BILITOT 0.3 11/20/2019  ALKPHOS 58 11/20/2019   AST 10 11/20/2019   ALT 11 11/20/2019   PROT 6.7 11/20/2019   ALBUMIN 4.0 11/20/2019   CALCIUM 9.2 11/20/2019   ANIONGAP 10 10/21/2015   GFR 105.79 11/20/2019   Lab Results  Component Value Date   CHOL 185 11/20/2019   Lab Results  Component Value Date   HDL 62.20 11/20/2019   Lab Results  Component Value Date   LDLCALC 100 (H) 11/20/2019   Lab Results  Component Value Date   TRIG 114.0 11/20/2019   Lab Results  Component Value Date   CHOLHDL 3 11/20/2019   Lab Results  Component Value Date   HGBA1C 6.5 11/20/2019      Assessment & Plan:   Problem List Items Addressed This Visit      Endocrine   Diabetes mellitus ([place])     Musculoskeletal and  Integument   Boil, groin - Primary      Meds ordered this encounter  Medications  . doxycycline (VIBRA-TABS) 100 MG tablet    Sig: Take 1 tablet (100 mg total) by mouth 2 (two) times daily.    Dispense:  14 tablet    Refill:  0    Order Specific Question:   Supervising Provider    Answer:   Einar Pheasant [id number]  . saccharomyces boulardii (FLORASTOR) 250 MG capsule    Sig: Take 1 capsule (250 mg total) by mouth 2 (two) times daily for 14 days.    Dispense:  28 capsule    Refill:  0    Order Specific Question:   Supervising Provider    Answer:   Einar Pheasant [[id number]]   For the skin lesion on the mons pubis- apply warm moist heat- do not rub or apply pressure.   Take the doxycyline as ordered for 1 week.  This can give your risk of skin sunburn so be sure to take cover when you are outdoors or use sunscreen if going to be out in the sun.  Antibiotics can also cause  C. difficile diarrhea.  To help prevent this take a probiotic such as Florastor for 2 weeks while on antibiotic.  You can also eat yogurt such as Activia.   Please follow-up with either myself or Dr. Nicki Reaper in 1 week for recheck.  Call in the meantime if symptoms worsen or change or if you  develop something new.  Follow-up: Return in about 1 week (around 12/24/2019).   This visit occurred during the SARS-CoV-2 public health emergency.  Safety protocols were in place, including screening questions prior to the visit, additional usage of staff PPE, and extensive cleaning of exam room while observing appropriate contact time as indicated for disinfecting solutions.   Denice Paradise, NP

## 2019-12-17 NOTE — Patient Instructions (Addendum)
For the skin lesion on the mons pubis- apply warm moist heat- do not rub or apply pressure.  Take the doxycyline as ordered for 1 week.  This can give your risk of skin sunburn so be sure to take cover when you are outdoors or use sunscreen if going to be out in the sun.  Antibiotics can also cause  C. difficile diarrhea.  To help prevent this take a probiotic such as Florastor for 2 weeks while on antibiotic.  You can also eat yogurt such as Activia.   Please follow-up with either myself or Dr. Lorin Picket in 1 week for recheck.  Call in the meantime if symptoms worsen or change or if you  develop something new.   Cellulitis, Adult  Cellulitis is a skin infection. The infected area is usually warm, red, swollen, and tender. This condition occurs most often in the arms and lower legs. The infection can travel to the muscles, blood, and underlying tissue and become serious. It is very important to get treated for this condition. What are the causes? Cellulitis is caused by bacteria. The bacteria enter through a break in the skin, such as a cut, burn, insect bite, open sore, or crack. What increases the risk? This condition is more likely to occur in people who:  Have a weak body defense system (immune system).  Have open wounds on the skin, such as cuts, burns, bites, and scrapes. Bacteria can enter the body through these open wounds.  Are older than 61 years of age.  Have diabetes.  Have a type of long-lasting (chronic) liver disease (cirrhosis) or kidney disease.  Are obese.  Have a skin condition such as: ? Itchy rash (eczema). ? Slow movement of blood in the veins (venous stasis). ? Fluid buildup below the skin (edema).  Have had radiation therapy.  Use IV drugs. What are the signs or symptoms? Symptoms of this condition include:  Redness, streaking, or spotting on the skin.  Swollen area of the skin.  Tenderness or pain when an area of the skin is touched.  Warm skin.  A  fever.  Chills.  Blisters. How is this diagnosed? This condition is diagnosed based on a medical history and physical exam. You may also have tests, including:  Blood tests.  Imaging tests. How is this treated? Treatment for this condition may include:  Medicines, such as antibiotic medicines or medicines to treat allergies (antihistamines).  Supportive care, such as rest and application of cold or warm cloths (compresses) to the skin.  Hospital care, if the condition is severe. The infection usually starts to get better within 1-2 days of treatment. Follow these instructions at home:  Medicines  Take over-the-counter and prescription medicines only as told by your health care provider.  If you were prescribed an antibiotic medicine, take it as told by your health care provider. Do not stop taking the antibiotic even if you start to feel better. General instructions  Drink enough fluid to keep your urine pale yellow.  Do not touch or rub the infected area.  Raise (elevate) the infected area above the level of your heart while you are sitting or lying down.  Apply warm or cold compresses to the affected area as told by your health care provider.  Keep all follow-up visits as told by your health care provider. This is important. These visits let your health care provider make sure a more serious infection is not developing. Contact a health care provider if:  You  have a fever.  Your symptoms do not begin to improve within 1-2 days of starting treatment.  Your bone or joint underneath the infected area becomes painful after the skin has healed.  Your infection returns in the same area or another area.  You notice a swollen bump in the infected area.  You develop new symptoms.  You have a general ill feeling (malaise) with muscle aches and pains. Get help right away if:  Your symptoms get worse.  You feel very sleepy.  You develop vomiting or diarrhea that  persists.  You notice red streaks coming from the infected area.  Your red area gets larger or turns dark in color. These symptoms may represent a serious problem that is an emergency. Do not wait to see if the symptoms will go away. Get medical help right away. Call your local emergency services (911 in the [place] Do not drive yourself to the hospital. Summary  Cellulitis is a skin infection. This condition occurs most often in the arms and lower legs.  Treatment for this condition may include medicines, such as antibiotic medicines or antihistamines.  Take over-the-counter and prescription medicines only as told by your health care provider. If you were prescribed an antibiotic medicine, do not stop taking the antibiotic even if you start to feel better.  Contact a health care provider if your symptoms do not begin to improve within 1-2 days of starting treatment or your symptoms get worse.  Keep all follow-up visits as told by your health care provider. This is important. These visits let your health care provider make sure that a more serious infection is not developing. This information is not intended to replace advice given to you by your health care provider. Make sure you discuss any questions you have with your health care provider. Document Revised: 07/20/2017 Document Reviewed: 07/20/2017 Elsevier Patient Education  2020 [employer]  Skin Abscess  A skin abscess is an infected area on or under your skin that contains a collection of pus and other material. An abscess may also be called a furuncle, carbuncle, or boil. An abscess can occur in or on almost any part of your body. Some abscesses break open (rupture) on their own. Most continue to get worse unless they are treated. The infection can spread deeper into the body and eventually into your blood, which can make you feel ill. Treatment usually involves draining the abscess. What are the causes? An abscess occurs when  germs, like bacteria, pass through your skin and cause an infection. This may be caused by:  A scrape or cut on your skin.  A puncture wound through your skin, including a needle injection or insect bite.  Blocked oil or sweat glands.  Blocked and infected hair follicles.  A cyst that forms beneath your skin (sebaceous cyst) and becomes infected. What increases the risk? This condition is more likely to develop in people who:  Have a weak body defense system (immune system).  Have diabetes.  Have dry and irritated skin.  Get frequent injections or use illegal IV drugs.  Have a foreign body in a wound, such as a splinter.  Have problems with their lymph system or veins. What are the signs or symptoms? Symptoms of this condition include:  A painful, firm bump under the skin.  A bump with pus at the top. This may break through the skin and drain. Other symptoms include:  Redness surrounding the abscess site.  Warmth.  Swelling of the  lymph nodes (glands) near the abscess.  Tenderness.  A sore on the skin. How is this diagnosed? This condition may be diagnosed based on:  A physical exam.  Your medical history.  A sample of pus. This may be used to find out what is causing the infection.  Blood tests.  Imaging tests, such as an ultrasound, CT scan, or MRI. How is this treated? A small abscess that drains on its own may not need treatment. Treatment for larger abscesses may include:  Moist heat or heat pack applied to the area several times a day.  A procedure to drain the abscess (incision and drainage).  Antibiotic medicines. For a severe abscess, you may first get antibiotics through an IV and then change to antibiotics by mouth. Follow these instructions at home: Medicines   Take over-the-counter and prescription medicines only as told by your health care provider.  If you were prescribed an antibiotic medicine, take it as told by your health care  provider. Do not stop taking the antibiotic even if you start to feel better. Abscess care   If you have an abscess that has not drained, apply heat to the affected area. Use the heat source that your health care provider recommends, such as a moist heat pack or a heating pad. ? Place a towel between your skin and the heat source. ? Leave the heat on for 20-30 minutes. ? Remove the heat if your skin turns bright red. This is especially important if you are unable to feel pain, heat, or cold. You may have a greater risk of getting burned.  Follow instructions from your health care provider about how to take care of your abscess. Make sure you: ? Cover the abscess with a bandage (dressing). ? Change your dressing or gauze as told by your health care provider. ? Wash your hands with soap and water before you change the dressing or gauze. If soap and water are not available, use hand sanitizer.  Check your abscess every day for signs of a worsening infection. Check for: ? More redness, swelling, or pain. ? More fluid or blood. ? Warmth. ? More pus or a bad smell. General instructions  To avoid spreading the infection: ? Do not share personal care items, towels, or hot tubs with others. ? Avoid making skin contact with other people.  Keep all follow-up visits as told by your health care provider. This is important. Contact a health care provider if you have:  More redness, swelling, or pain around your abscess.  More fluid or blood coming from your abscess.  Warm skin around your abscess.  More pus or a bad smell coming from your abscess.  A fever.  Muscle aches.  Chills or a general ill feeling. Get help right away if you:  Have severe pain.  See red streaks on your skin spreading away from the abscess. Summary  A skin abscess is an infected area on or under your skin that contains a collection of pus and other material.  A small abscess that drains on its own may not  need treatment.  Treatment for larger abscesses may include having a procedure to drain the abscess and taking an antibiotic. This information is not intended to replace advice given to you by your health care provider. Make sure you discuss any questions you have with your health care provider. Document Revised: 06/21/2018 Document Reviewed: 04/13/2017 Elsevier Patient Education  2020 [employer].

## 2019-12-20 ENCOUNTER — Telehealth: Payer: Self-pay | Admitting: Nurse Practitioner

## 2019-12-20 NOTE — Telephone Encounter (Signed)
Patient states its doing okay; not doing worse. Tolerating the doxy fine. Scheduled appt for next week

## 2019-12-20 NOTE — Telephone Encounter (Signed)
Pt is returning your call

## 2019-12-20 NOTE — Telephone Encounter (Signed)
LMTCB for sx update and to change OV appt

## 2019-12-20 NOTE — Telephone Encounter (Signed)
  1. Please call her for symptom update. How does the lesion on the mons pubis look after a couple days of moist heat and Doxy?   2. Also, she needs OV next week- not in 4 weeks.

## 2019-12-24 ENCOUNTER — Other Ambulatory Visit: Payer: Self-pay

## 2019-12-24 ENCOUNTER — Encounter: Payer: Self-pay | Admitting: Nurse Practitioner

## 2019-12-24 ENCOUNTER — Ambulatory Visit (INDEPENDENT_AMBULATORY_CARE_PROVIDER_SITE_OTHER): Payer: BC Managed Care – PPO | Admitting: Nurse Practitioner

## 2019-12-24 VITALS — BP 122/70 | HR 75 | Temp 98.3°F | Ht 63.0 in | Wt 197.0 lb

## 2019-12-24 DIAGNOSIS — N760 Acute vaginitis: Secondary | ICD-10-CM | POA: Insufficient documentation

## 2019-12-24 DIAGNOSIS — L02224 Furuncle of groin: Secondary | ICD-10-CM | POA: Diagnosis not present

## 2019-12-24 MED ORDER — FLUCONAZOLE 150 MG PO TABS: ORAL_TABLET | ORAL | 0 refills | Status: DC

## 2019-12-24 NOTE — Patient Instructions (Signed)
The groin boil site looks much improved.  There is no longer any redness, swelling, tenderness, and I do not think you need any more antibiotics at this time.  I do advise that you watch the site, notify us if it starts to swell up again, become tender or get red again.  You have had a history of C. Difficile.  You developed watery diarrhea last night.  Please monitor very carefully, take the Francoise Schaumann that is ordered at the pharmacy, begin daily yogurt such as Activia for another source of good probiotics.  Call us if this diarrhea continues as you will need to have stool studies.  For your vaginal itchiness, I have ordered Diflucan 150 mg x 1 tablet.  If symptoms are still present in 3 days you may repeat.  If no resolution please call the office.

## 2019-12-24 NOTE — Progress Notes (Signed)
Established Patient Office Visit  Subjective:  Patient ID: Evelyn James, female    DOB: Dec 19, 1960  Age: 59 y.o. MRN: [id number]  CC:  Chief Complaint  Patient presents with  . Follow-up    boil    HPI Evelyn James presents for follow up of boil s/p doxy use. The boil is much better. No longer red or painful. No induration.   Doxy gave her diarrhea last night and was a little loose before. Hx of C diff. She did not pick up Florastor but is taking [employer]. She does not eat yogurt. Some vaginal yeast sx starting- prone to this.   Past Medical History:  Diagnosis Date  . Anemia    Noted 11/2011  . Anxiety   . Chest pain    Admitted 11/2011: cath with mild nonobstructive coronary plaque, normal EF, negative cardiac enzymes and negative d-dimer.  . Diabetes mellitus   . Fatty liver disease, nonalcoholic 8832  . GERD (gastroesophageal reflux disease)   . Hypercholesterolemia   . Hypertension   . Reflux   . TMJ dysfunction     Past Surgical History:  Procedure Laterality Date  . ABDOMINAL HYSTERECTOMY  2009   total  . BILATERAL SALPINGOOPHORECTOMY  2009   benign ovarian tumor  . HERNIA REPAIR     umbilical  . IMAGE GUIDED SINUS SURGERY N/A 10/22/2015   Procedure: IMAGE GUIDED SINUS SURGERY;  Surgeon: Carloyn Manner, MD;  Location: [facility] ORS;  Service: ENT;  Laterality: N/A;  . KNEE SURGERY Bilateral   . LEFT HEART CATHETERIZATION WITH CORONARY ANGIOGRAM N/A 12/09/2011   Procedure: LEFT HEART CATHETERIZATION WITH CORONARY ANGIOGRAM;  Surgeon: Minus Breeding, MD;  Location: [facility] CATH LAB;  Service: Cardiovascular;  Laterality: N/A;  . SPHENOIDECTOMY Left 10/22/2015   Procedure: SPHENOIDECTOMY;  Surgeon: Carloyn Manner, MD;  Location: [facility] ORS;  Service: ENT;  Laterality: Left;  . SUBMANDIBULAR MASS EXCISION  1996   [facility]     Family History  Problem Relation Age of Onset  . Heart disease Father        Father had CABG in his 74s  . Colon polyps Father   .  Lung cancer Mother 14  . Breast cancer Paternal Grandmother 21  . Arthritis/Rheumatoid Paternal Grandfather   . Breast cancer Paternal Aunt 16    Social History   Socioeconomic History  . Marital status: Married    Spouse name: Not on file  . Number of children: 2  . Years of education: Not on file  . Highest education level: Not on file  Occupational History  . Not on file  Tobacco Use  . Smoking status: Never Smoker  . Smokeless tobacco: Never Used  Vaping Use  . Vaping Use: Never used  Substance and Sexual Activity  . Alcohol use: No    Alcohol/week: 0.0 standard drinks  . Drug use: No  . Sexual activity: Not on file  Other Topics Concern  . Not on file  Social History Narrative  . Not on file   Social Determinants of Health   Financial Resource Strain:   . Difficulty of Paying Living Expenses: Not on file  Food Insecurity:   . Worried About Charity fundraiser in the Last Year: Not on file  . Ran Out of Food in the Last Year: Not on file  Transportation Needs:   . Lack of Transportation (Medical): Not on file  . Lack of Transportation (Non-Medical): Not on file  Physical Activity:   .  Days of Exercise per Week: Not on file  . Minutes of Exercise per Session: Not on file  Stress:   . Feeling of Stress : Not on file  Social Connections:   . Frequency of Communication with Friends and Family: Not on file  . Frequency of Social Gatherings with Friends and Family: Not on file  . Attends Religious Services: Not on file  . Active Member of Clubs or Organizations: Not on file  . Attends Archivist Meetings: Not on file  . Marital Status: Not on file  Intimate Partner Violence:   . Fear of Current or Ex-Partner: Not on file  . Emotionally Abused: Not on file  . Physically Abused: Not on file  . Sexually Abused: Not on file    Outpatient Medications Prior to Visit  Medication Sig Dispense Refill  . acetaminophen (TYLENOL) 500 MG tablet Take 1,000 mg  by mouth every 6 (six) hours as needed for mild pain.    Marland Kitchen acyclovir ointment (ZOVIRAX) 5 % Apply 1 application topically every 3 (three) hours. 5 g 2  . albuterol (VENTOLIN HFA) 108 (90 Base) MCG/ACT inhaler Inhale 2 puffs into the lungs every 6 (six) hours as needed for wheezing or shortness of breath. 8 g 6  . Blood Glucose Monitoring Suppl (ONE TOUCH ULTRA 2) w/Device KIT U D TO CHECK BLOOD SUGARS  0  . cetirizine (ZYRTEC) 10 MG tablet Take 10 mg by mouth daily.    . cyclobenzaprine (FLEXERIL) 10 MG tablet Take 0.5-1 tablets (5-10 mg total) by mouth at bedtime as needed for muscle spasms. 30 tablet 0  . doxycycline (VIBRA-TABS) 100 MG tablet Take 1 tablet (100 mg total) by mouth 2 (two) times daily. 14 tablet 0  . escitalopram (LEXAPRO) 20 MG tablet TAKE 1 TABLET(20 MG) BY MOUTH DAILY 90 tablet 1  . esomeprazole (NEXIUM) 20 MG capsule Take 20 mg by mouth daily at 12 noon.    Marland Kitchen estradiol (ESTRACE) 1 MG tablet TAKE 1 TABLET(1 MG) BY MOUTH DAILY 30 tablet 2  . FARXIGA 10 MG TABS tablet TAKE 1 TABLET BY MOUTH EVERY MORNING 90 tablet 1  . Fe Fum-FePoly-Vit C-Vit B3 (INTEGRA) 62.5-62.5-40-3 MG CAPS TAKE 1 CAPSULE BY MOUTH TWICE DAILY 180 capsule 1  . fluconazole (DIFLUCAN) 150 MG tablet Take one tablet x 1.  If persistent symptoms after three days may repeat x 1. 2 tablet 0  . fluticasone (FLONASE) 50 MCG/ACT nasal spray Place 2 sprays into both nostrils daily. 16 g 5  . fluticasone (FLOVENT HFA) 110 MCG/ACT inhaler Inhale 2 puffs into the lungs in the morning and at bedtime. 1 each 12  . glucose blood test strip three times before Tymir Terral. 100 each 12  . lisinopril (ZESTRIL) 20 MG tablet TAKE 1 TABLET(20 MG) BY MOUTH DAILY 90 tablet 1  . Melatonin 10 MG TABS Take 10 mg by mouth at bedtime as needed (sleep).    . meloxicam (MOBIC) 15 MG tablet Take 15 mg by mouth daily.  3  . metFORMIN (GLUCOPHAGE) 1000 MG tablet Take 1 tablet (1,000 mg total) by mouth 2 (two) times daily with a meal. 180 tablet 1  .  mupirocin ointment (BACTROBAN) 2 % Place 1 application into the nose 2 (two) times daily. 22 g 0  . Probiotic Product (PHILLIPS COLON HEALTH PO) Take 1 tablet by mouth daily as needed (colon health).     . rosuvastatin (CRESTOR) 20 MG tablet Take 1 tablet (20 mg total) by mouth  daily. 30 tablet 3  . saccharomyces boulardii (FLORASTOR) 250 MG capsule Take 1 capsule (250 mg total) by mouth 2 (two) times daily for 14 days. 28 capsule 0  . TRULICITY 1.5 FK/8.1EX SOPN inject 1.5 milligrams subcutaneously every 7 days 3 pen 4  . valACYclovir (VALTREX) 1000 MG tablet Take 1 tablet (1,000 mg total) by mouth 2 (two) times daily. 21 tablet 0   No facility-administered medications prior to visit.    Allergies  Allergen Reactions  . Sulfa Antibiotics Other (See Comments)    Topical--red itchy; oral--makes mouth raw Bleeding sores in mouth. Raw mouth and sores on gums      Review of Systems Pertinent positives noted in history of present illness and otherwise negative.   Objective:    Physical Exam Vitals reviewed.  Constitutional:      Appearance: Normal appearance.  Skin:    General: Skin is warm and dry.     Comments: Mon pubis skin abscess is resolved.  The bluish-purple swelling has resolved as well as surrounding erythema.    No induration.  No warmth or tenderness.  Neurological:     Mental Status: She is alert.     BP 122/70 (BP Location: Left Arm, Patient Position: Sitting, Cuff Size: Normal)   Pulse 75   Temp 98.3 F (36.8 C) (Oral)   Ht 5' 3"  (1.6 m)   Wt 197 lb (89.4 kg)   SpO2 95%   BMI 34.90 kg/m  Wt Readings from Last 3 Encounters:  12/24/19 197 lb (89.4 kg)  12/17/19 196 lb (88.9 kg)  12/16/19 196 lb 3.2 oz (89 kg)     Health Maintenance Due  Topic Date Due  . HIV Screening  Never done  . OPHTHALMOLOGY EXAM  09/25/2012    There are no preventive care reminders to display for this patient.  Lab Results  Component Value Date   TSH 1.73 04/11/2019    Lab Results  Component Value Date   WBC 10.0 08/15/2019   HGB 13.6 08/15/2019   HCT 41.7 08/15/2019   MCV 89.0 08/15/2019   PLT 211.0 08/15/2019   Lab Results  Component Value Date   NA 141 11/20/2019   K 4.2 11/20/2019   CO2 33 (H) 11/20/2019   GLUCOSE 106 (H) 11/20/2019   BUN 16 11/20/2019   CREATININE 0.58 11/20/2019   BILITOT 0.3 11/20/2019   ALKPHOS 58 11/20/2019   AST 10 11/20/2019   ALT 11 11/20/2019   PROT 6.7 11/20/2019   ALBUMIN 4.0 11/20/2019   CALCIUM 9.2 11/20/2019   ANIONGAP 10 10/21/2015   GFR 105.79 11/20/2019   Lab Results  Component Value Date   CHOL 185 11/20/2019   Lab Results  Component Value Date   HDL 62.20 11/20/2019   Lab Results  Component Value Date   LDLCALC 100 (H) 11/20/2019   Lab Results  Component Value Date   TRIG 114.0 11/20/2019   Lab Results  Component Value Date   CHOLHDL 3 11/20/2019   Lab Results  Component Value Date   HGBA1C 6.5 11/20/2019      Assessment & Plan:   Problem List Items Addressed This Visit      Musculoskeletal and Integument   Boil, groin - Primary   Relevant Medications   fluconazole (DIFLUCAN) 150 MG tablet     Genitourinary   Acute vaginitis      Meds ordered this encounter  Medications  . fluconazole (DIFLUCAN) 150 MG tablet    Sig: Take  1 pill today. If no improvement- may repeat pill in 3 days.    Dispense:  2 tablet    Refill:  0    Order Specific Question:   Supervising Provider    Answer:   Einar Pheasant [[id number]]   The groin boil site looks much improved.  There is no longer any redness, swelling, tenderness, and I do not think you need any more antibiotics at this time.  I do advise that you watch the site, notify us if it starts to swell up again, become tender or get red again.  You have had a history of C. Difficile.  You developed watery diarrhea last night.  Please monitor very carefully, take the Kathrin Ruddy that is ordered at the pharmacy, begin daily yogurt such  as Activia for another source of good probiotics.  Call us if this diarrhea continues as you will need to have stool studies.  For your vaginal itchiness, I have ordered Diflucan 150 mg x 1 tablet.  If symptoms are still present in 3 days you may repeat.  If no resolution please call the office.  Follow-up: Return if symptoms worsen or fail to improve.   This visit occurred during the SARS-CoV-2 public health emergency.  Safety protocols were in place, including screening questions prior to the visit, additional usage of staff PPE, and extensive cleaning of exam room while observing appropriate contact time as indicated for disinfecting solutions.    Denice Paradise, NP

## 2019-12-25 ENCOUNTER — Encounter: Payer: Self-pay | Admitting: Nurse Practitioner

## 2020-01-02 ENCOUNTER — Other Ambulatory Visit: Payer: Self-pay

## 2020-01-02 ENCOUNTER — Other Ambulatory Visit (INDEPENDENT_AMBULATORY_CARE_PROVIDER_SITE_OTHER): Payer: BC Managed Care – PPO

## 2020-01-02 DIAGNOSIS — E7800 Pure hypercholesterolemia, unspecified: Secondary | ICD-10-CM | POA: Diagnosis not present

## 2020-01-02 LAB — HEPATIC FUNCTION PANEL
ALT: 13 U/L (ref 0–35)
AST: 11 U/L (ref 0–37)
Albumin: 4.2 g/dL (ref 3.5–5.2)
Alkaline Phosphatase: 55 U/L (ref 39–117)
Bilirubin, Direct: 0.1 mg/dL (ref 0.0–0.3)
Total Bilirubin: 0.3 mg/dL (ref 0.2–1.2)
Total Protein: 6.5 g/dL (ref 6.0–8.3)

## 2020-01-16 ENCOUNTER — Ambulatory Visit: Payer: BC Managed Care – PPO | Admitting: Internal Medicine

## 2020-02-19 ENCOUNTER — Ambulatory Visit
Admission: RE | Admit: 2020-02-19 | Discharge: 2020-02-19 | Disposition: A | Discharge status: Home / Self Care | Payer: BC Managed Care – PPO | Source: Ambulatory Visit | Attending: Internal Medicine | Admitting: Internal Medicine

## 2020-02-19 ENCOUNTER — Other Ambulatory Visit: Payer: Self-pay

## 2020-02-19 DIAGNOSIS — Z1231 Encounter for screening mammogram for malignant neoplasm of breast: Secondary | ICD-10-CM | POA: Diagnosis present

## 2020-03-02 ENCOUNTER — Other Ambulatory Visit: Payer: Self-pay | Admitting: Internal Medicine

## 2020-03-02 ENCOUNTER — Encounter: Payer: Self-pay | Admitting: Internal Medicine

## 2020-03-03 ENCOUNTER — Other Ambulatory Visit: Payer: Self-pay

## 2020-03-03 MED ORDER — INTEGRA 62.5-62.5-40-3 MG PO CAPS: 1.0000 | ORAL_CAPSULE | Freq: Two times a day (BID) | ORAL | 1 refills | Status: DC

## 2020-03-05 ENCOUNTER — Ambulatory Visit: Payer: BC Managed Care – PPO | Admitting: Internal Medicine

## 2020-03-13 ENCOUNTER — Encounter: Payer: Self-pay | Admitting: Internal Medicine

## 2020-03-15 ENCOUNTER — Other Ambulatory Visit: Payer: Self-pay | Admitting: Internal Medicine

## 2020-03-16 ENCOUNTER — Telehealth: Payer: Self-pay | Admitting: Internal Medicine

## 2020-03-16 DIAGNOSIS — R300 Dysuria: Secondary | ICD-10-CM

## 2020-03-16 NOTE — Telephone Encounter (Signed)
Reviewed.  Her previous urine culture - did not grow out isolated bacteria.  If persistent symptoms, will need another urine sample and can schedule visit.

## 2020-03-16 NOTE — Addendum Note (Signed)
Addended by: Larry Sierras on: 03/16/2020 11:22 AM   Modules accepted: Orders

## 2020-03-16 NOTE — Telephone Encounter (Signed)
Was treated for a UTI last week- Cephalexin 500 mg BID x 7 days. Symptoms are burning with urination, pressure and frequency. Abx helped but did not clear it up. Pt would like to come in for another urine. Are you ok with doing this?

## 2020-03-16 NOTE — Telephone Encounter (Signed)
Orders placed. Appt scheduled.

## 2020-03-16 NOTE — Telephone Encounter (Signed)
Pt thinks that she has another UTI  She said that she finished her medication for the last one on 03/13/20 and wanted to come in and do a Urine

## 2020-03-17 ENCOUNTER — Other Ambulatory Visit
Admission: RE | Admit: 2020-03-17 | Discharge: 2020-03-17 | Disposition: A | Discharge status: Home / Self Care | Payer: BC Managed Care – PPO | Source: Ambulatory Visit | Attending: Internal Medicine | Admitting: Internal Medicine

## 2020-03-17 DIAGNOSIS — R300 Dysuria: Secondary | ICD-10-CM | POA: Diagnosis not present

## 2020-03-17 LAB — URINALYSIS, ROUTINE W REFLEX MICROSCOPIC
Bilirubin Urine: NEGATIVE
Glucose, UA: >=500 mg/dL — AB
Hgb urine dipstick: NEGATIVE
Ketones, ur: NEGATIVE mg/dL
Nitrite: NEGATIVE
Protein, ur: NEGATIVE mg/dL
Specific Gravity, Urine: 1.013 (ref 1.005–1.030)
pH: 6 (ref 5.0–8.0)

## 2020-03-18 ENCOUNTER — Other Ambulatory Visit: Payer: Self-pay

## 2020-03-18 ENCOUNTER — Telehealth (INDEPENDENT_AMBULATORY_CARE_PROVIDER_SITE_OTHER): Payer: BC Managed Care – PPO | Admitting: Internal Medicine

## 2020-03-18 ENCOUNTER — Encounter: Payer: Self-pay | Admitting: Internal Medicine

## 2020-03-18 DIAGNOSIS — I10 Essential (primary) hypertension: Secondary | ICD-10-CM | POA: Diagnosis not present

## 2020-03-18 DIAGNOSIS — R399 Unspecified symptoms and signs involving the genitourinary system: Secondary | ICD-10-CM

## 2020-03-18 DIAGNOSIS — E1165 Type 2 diabetes mellitus with hyperglycemia: Secondary | ICD-10-CM

## 2020-03-18 LAB — URINE CULTURE: Culture: NO GROWTH

## 2020-03-18 MED ORDER — GLUCOSE BLOOD VI STRP: ORAL_STRIP | 12 refills | Status: DC

## 2020-03-18 MED ORDER — NITROFURANTOIN MONOHYD MACRO 100 MG PO CAPS: 100.0000 mg | ORAL_CAPSULE | Freq: Two times a day (BID) | ORAL | 0 refills | Status: DC

## 2020-03-18 MED ORDER — FLUCONAZOLE 150 MG PO TABS: ORAL_TABLET | ORAL | 0 refills | Status: DC

## 2020-03-18 NOTE — Assessment & Plan Note (Signed)
Symptoms as outlined.  The increased frequency and dysuria have improved with urostat.  Now with some abdominal cramping.  No fever, nausea or vomiting.  Given persistent symptoms and symptoms consistent with previous UTI.  Will cover with abx.  Await culture results.  Treat with macrobid.  Stay hydrated.  Follow.

## 2020-03-18 NOTE — Assessment & Plan Note (Signed)
Continue lisinopril.  Blood pressure has been doing well.  Follow pressures.

## 2020-03-18 NOTE — Assessment & Plan Note (Signed)
Low carb diet and exercise.   °

## 2020-03-18 NOTE — Telephone Encounter (Signed)
Rx sent in. Has appt today

## 2020-03-18 NOTE — Progress Notes (Signed)
Virtual Visit via virtual Note  This visit type was conducted due to national recommendations for restrictions regarding the COVID-19 pandemic (e.g. social distancing).  This format is felt to be most appropriate for this patient at this time.  All issues noted in this document were discussed and addressed.  No physical exam was performed (except for noted visual exam findings with Video Visits).   I connected with Evelyn James by a video enabled telemedicine application and verified that I am speaking with the correct person using two identifiers. Location patient: home Location provider: work  Persons participating in the virtual visit: patient, provider  The limitations, risks, security and privacy concerns of performing an evaluation and management service by video and the availability of in person appointments have been discussed.  It has also been discussed with the patient that there may be a patient responsible charge related to this service. The patient expressed understanding and agreed to proceed.   Reason for visit: work in appt  HPI: Work in appt with concerns regarding a possible urinary tract infection.  She reports had what felt like a yeast infection a couple of weeks before Christmas.  Took Diflucan.  Symptoms resolved.  Just before Christmas, she started noticing some dysuria and increased frequency.  Evaluated.  Prescribed keflex.  Symptoms resolved.  Culture - mixed flora.  No symptoms for a couple of days and then developed increased urinary frequency and dysuria.  Taking urostat.  The frequency and dysuria have improved, but still present (minimal).  She is having lower abdominal pressure/cramping. This feels similar to previous urinary tract infections.  She is also having some minimal vaginal irritation - feels like start of yeast infection.  No fever.  No nausea or vomiting.  No back pain.  Eating.  No vaginal discharge.     ROS: See pertinent positives and negatives  per HPI.  Past Medical History:  Diagnosis Date  . Anemia    Noted 11/2011  . Anxiety   . Chest pain    Admitted 11/2011: cath with mild nonobstructive coronary plaque, normal EF, negative cardiac enzymes and negative d-dimer.  . Diabetes mellitus   . Fatty liver disease, nonalcoholic 4481  . GERD (gastroesophageal reflux disease)   . Hypercholesterolemia   . Hypertension   . Reflux   . TMJ dysfunction     Past Surgical History:  Procedure Laterality Date  . ABDOMINAL HYSTERECTOMY  2009   total  . BILATERAL SALPINGOOPHORECTOMY  2009   benign ovarian tumor  . HERNIA REPAIR     umbilical  . IMAGE GUIDED SINUS SURGERY N/A 10/22/2015   Procedure: IMAGE GUIDED SINUS SURGERY;  Surgeon: Carloyn Manner, MD;  Location: [facility] ORS;  Service: ENT;  Laterality: N/A;  . KNEE SURGERY Bilateral   . LEFT HEART CATHETERIZATION WITH CORONARY ANGIOGRAM N/A 12/09/2011   Procedure: LEFT HEART CATHETERIZATION WITH CORONARY ANGIOGRAM;  Surgeon: Minus Breeding, MD;  Location: [facility] CATH LAB;  Service: Cardiovascular;  Laterality: N/A;  . SPHENOIDECTOMY Left 10/22/2015   Procedure: SPHENOIDECTOMY;  Surgeon: Carloyn Manner, MD;  Location: [facility] ORS;  Service: ENT;  Laterality: Left;  . SUBMANDIBULAR MASS EXCISION  1996   [facility]     Family History  Problem Relation Age of Onset  . Heart disease Father        Father had CABG in his 52s  . Colon polyps Father   . Lung cancer Mother 50  . Breast cancer Paternal Grandmother 13  . Arthritis/Rheumatoid Paternal Grandfather   .  Breast cancer Paternal Aunt 67    SOCIAL HX: reviewed.    Current Outpatient Medications:  .  nitrofurantoin, macrocrystal-monohydrate, (MACROBID) 100 MG capsule, Take 1 capsule (100 mg total) by mouth 2 (two) times daily., Disp: 10 capsule, Rfl: 0 .  acetaminophen (TYLENOL) 500 MG tablet, Take 1,000 mg by mouth every 6 (six) hours as needed for mild pain., Disp: , Rfl:  .  acyclovir ointment (ZOVIRAX) 5 %, Apply 1 application  topically every 3 (three) hours., Disp: 5 g, Rfl: 2 .  albuterol (VENTOLIN HFA) 108 (90 Base) MCG/ACT inhaler, Inhale 2 puffs into the lungs every 6 (six) hours as needed for wheezing or shortness of breath., Disp: 8 g, Rfl: 6 .  Blood Glucose Monitoring Suppl (ONE TOUCH ULTRA 2) w/Device KIT, U D TO CHECK BLOOD SUGARS, Disp: , Rfl: 0 .  cetirizine (ZYRTEC) 10 MG tablet, Take 10 mg by mouth daily., Disp: , Rfl:  .  cyclobenzaprine (FLEXERIL) 10 MG tablet, Take 0.5-1 tablets (5-10 mg total) by mouth at bedtime as needed for muscle spasms., Disp: 30 tablet, Rfl: 0 .  escitalopram (LEXAPRO) 20 MG tablet, TAKE 1 TABLET(20 MG) BY MOUTH DAILY, Disp: 90 tablet, Rfl: 1 .  esomeprazole (NEXIUM) 20 MG capsule, Take 20 mg by mouth daily at 12 noon., Disp: , Rfl:  .  estradiol (ESTRACE) 1 MG tablet, TAKE 1 TABLET(1 MG) BY MOUTH DAILY, Disp: 30 tablet, Rfl: 2 .  FARXIGA 10 MG TABS tablet, TAKE 1 TABLET BY MOUTH EVERY MORNING, Disp: 90 tablet, Rfl: 1 .  Fe Fum-FePoly-Vit C-Vit B3 (INTEGRA) 62.5-62.5-40-3 MG CAPS, Take 1 capsule by mouth 2 (two) times daily., Disp: 180 capsule, Rfl: 1 .  fluconazole (DIFLUCAN) 150 MG tablet, Take 1 pill today. If no improvement- may repeat pill in 3 days., Disp: 2 tablet, Rfl: 0 .  fluconazole (DIFLUCAN) 150 MG tablet, Take one tablet x 1.  If persistent symptoms after three days may repeat x 1., Disp: 2 tablet, Rfl: 0 .  fluticasone (FLONASE) 50 MCG/ACT nasal spray, Place 2 sprays into both nostrils daily., Disp: 16 g, Rfl: 5 .  fluticasone (FLOVENT HFA) 110 MCG/ACT inhaler, Inhale 2 puffs into the lungs in the morning and at bedtime., Disp: 1 each, Rfl: 12 .  glucose blood test strip, Use as directed to check blood sugars twice daily. Dx E11.9, Disp: 100 each, Rfl: 12 .  lisinopril (ZESTRIL) 20 MG tablet, TAKE 1 TABLET(20 MG) BY MOUTH DAILY, Disp: 90 tablet, Rfl: 1 .  Melatonin 10 MG TABS, Take 10 mg by mouth at bedtime as needed (sleep)., Disp: , Rfl:  .  meloxicam (MOBIC) 15  MG tablet, Take 15 mg by mouth daily., Disp: , Rfl: 3 .  metFORMIN (GLUCOPHAGE) 1000 MG tablet, TAKE 1 TABLET(1000 MG) BY MOUTH TWICE DAILY WITH A MEAL, Disp: 180 tablet, Rfl: 1 .  mupirocin ointment (BACTROBAN) 2 %, Place 1 application into the nose 2 (two) times daily., Disp: 22 g, Rfl: 0 .  Probiotic Product (PHILLIPS COLON HEALTH PO), Take 1 tablet by mouth daily as needed (colon health). , Disp: , Rfl:  .  rosuvastatin (CRESTOR) 20 MG tablet, TAKE 1 TABLET(20 MG) BY MOUTH DAILY, Disp: 30 tablet, Rfl: 3 .  TRULICITY 1.5 WU/9.8JX SOPN, inject 1.5 milligrams subcutaneously every 7 days, Disp: 3 pen, Rfl: 4 .  valACYclovir (VALTREX) 1000 MG tablet, Take 1 tablet (1,000 mg total) by mouth 2 (two) times daily., Disp: 21 tablet, Rfl: 0  EXAM:  GENERAL: alert,  oriented, appears well and in no acute distress  HEENT: atraumatic, conjunttiva clear, no obvious abnormalities on inspection of external nose and ears  NECK: normal movements of the head and neck  LUNGS: on inspection no signs of respiratory distress, breathing rate appears normal, no obvious gross SOB, gasping or wheezing  CV: no obvious cyanosis  PSYCH/NEURO: pleasant and cooperative, no obvious depression or anxiety, speech and thought processing grossly intact  ASSESSMENT AND PLAN:  Discussed the following assessment and plan:  Problem List Items Addressed This Visit    Hypertension    Continue lisinopril.  Blood pressure has been doing well.  Follow pressures.       Diabetes mellitus ([place])    Low carb diet and exercise.        UTI symptoms    Symptoms as outlined.  The increased frequency and dysuria have improved with urostat.  Now with some abdominal cramping.  No fever, nausea or vomiting.  Given persistent symptoms and symptoms consistent with previous UTI.  Will cover with abx.  Await culture results.  Treat with macrobid.  Stay hydrated.  Follow.            I discussed the assessment and treatment plan with  the patient. The patient was provided an opportunity to ask questions and all were answered. The patient agreed with the plan and demonstrated an understanding of the instructions.   The patient was advised to call back or seek an in-person evaluation if the symptoms worsen or if the condition fails to improve as anticipated.    Einar Pheasant, MD

## 2020-03-19 NOTE — Telephone Encounter (Signed)
Pt called to get lab results °

## 2020-03-20 NOTE — Telephone Encounter (Signed)
See result note.  

## 2020-03-23 ENCOUNTER — Telehealth: Payer: Self-pay | Admitting: *Deleted

## 2020-03-23 DIAGNOSIS — E7800 Pure hypercholesterolemia, unspecified: Secondary | ICD-10-CM

## 2020-03-23 DIAGNOSIS — I10 Essential (primary) hypertension: Secondary | ICD-10-CM

## 2020-03-23 DIAGNOSIS — E1165 Type 2 diabetes mellitus with hyperglycemia: Secondary | ICD-10-CM

## 2020-03-23 NOTE — Telephone Encounter (Signed)
Order placed for labs.

## 2020-03-23 NOTE — Telephone Encounter (Signed)
Please place future orders for lab appt.   Per last lab note, pt was supposed to be evaluated on Tuesday since she was still having symptoms after urine test. Pt has OV at 8:30 & lab appt at 8am (no orders)

## 2020-03-24 ENCOUNTER — Other Ambulatory Visit (INDEPENDENT_AMBULATORY_CARE_PROVIDER_SITE_OTHER): Payer: BC Managed Care – PPO

## 2020-03-24 ENCOUNTER — Other Ambulatory Visit: Payer: Self-pay

## 2020-03-24 ENCOUNTER — Ambulatory Visit (INDEPENDENT_AMBULATORY_CARE_PROVIDER_SITE_OTHER): Payer: BC Managed Care – PPO | Admitting: Internal Medicine

## 2020-03-24 DIAGNOSIS — E7800 Pure hypercholesterolemia, unspecified: Secondary | ICD-10-CM

## 2020-03-24 DIAGNOSIS — I25118 Atherosclerotic heart disease of native coronary artery with other forms of angina pectoris: Secondary | ICD-10-CM | POA: Diagnosis not present

## 2020-03-24 DIAGNOSIS — J4530 Mild persistent asthma, uncomplicated: Secondary | ICD-10-CM | POA: Diagnosis not present

## 2020-03-24 DIAGNOSIS — I10 Essential (primary) hypertension: Secondary | ICD-10-CM

## 2020-03-24 DIAGNOSIS — E1165 Type 2 diabetes mellitus with hyperglycemia: Secondary | ICD-10-CM

## 2020-03-24 DIAGNOSIS — D649 Anemia, unspecified: Secondary | ICD-10-CM

## 2020-03-24 DIAGNOSIS — K219 Gastro-esophageal reflux disease without esophagitis: Secondary | ICD-10-CM

## 2020-03-24 DIAGNOSIS — G4730 Sleep apnea, unspecified: Secondary | ICD-10-CM | POA: Diagnosis not present

## 2020-03-24 LAB — HEPATIC FUNCTION PANEL
ALT: 12 U/L (ref 0–35)
AST: 10 U/L (ref 0–37)
Albumin: 4.1 g/dL (ref 3.5–5.2)
Alkaline Phosphatase: 47 U/L (ref 39–117)
Bilirubin, Direct: 0.1 mg/dL (ref 0.0–0.3)
Total Bilirubin: 0.3 mg/dL (ref 0.2–1.2)
Total Protein: 6.7 g/dL (ref 6.0–8.3)

## 2020-03-24 LAB — LIPID PANEL
Cholesterol: 142 mg/dL (ref 0–200)
HDL: 65.4 mg/dL (ref >39.00)
LDL Cholesterol: 56 mg/dL (ref 0–99)
NonHDL: 76.2
Total CHOL/HDL Ratio: 2
Triglycerides: 101 mg/dL (ref 0.0–149.0)
VLDL: 20.2 mg/dL (ref 0.0–40.0)

## 2020-03-24 LAB — BASIC METABOLIC PANEL
BUN: 10 mg/dL (ref 6–23)
CO2: 31 mEq/L (ref 19–32)
Calcium: 9.2 mg/dL (ref 8.4–10.5)
Chloride: 103 mEq/L (ref 96–112)
Creatinine, Ser: 0.62 mg/dL (ref 0.40–1.20)
GFR: 96.35 mL/min (ref >60.00)
Glucose, Bld: 96 mg/dL (ref 70–99)
Potassium: 4.4 mEq/L (ref 3.5–5.1)
Sodium: 139 mEq/L (ref 135–145)

## 2020-03-24 LAB — TSH: TSH: 1.98 u[IU]/mL (ref 0.35–4.50)

## 2020-03-24 LAB — HEMOGLOBIN A1C: Hgb A1c MFr Bld: 6.4 % (ref 4.6–6.5)

## 2020-03-24 MED ORDER — ESTRADIOL 1 MG PO TABS: ORAL_TABLET | ORAL | 2 refills | Status: DC

## 2020-03-24 NOTE — Progress Notes (Signed)
Patient ID: Evelyn James, female   DOB: 1960-10-26, 60 y.o.   MRN: [id number]   Subjective:    Patient ID: Evelyn James, female    DOB: 09-29-1960, 60 y.o.   MRN: [id number]  HPI This visit occurred during the SARS-CoV-2 public health emergency.  Safety protocols were in place, including screening questions prior to the visit, additional usage of staff PPE, and extensive cleaning of exam room while observing appropriate contact time as indicated for disinfecting solutions.  Patient here for a scheduled follow up. Was recently evaluated 03/18/20 with concerns regarding urinary tract infection.  Was treated initially with keflex.  Subsequently given macrobid.  Urine culture negative.  She was having persistent abdominal cramping, but states this has subsided.  No abdominal pain or cramping now.  Eating.  No nausea or vomiting.  Some decreased portions.  Blood sugar at night - 140.  Two hours after eat - 70.  No chest pain or sob reported.  No fever or chest congestion.  Some drainage - flonase helps.    Past Medical History:  Diagnosis Date  . Anemia    Noted 11/2011  . Anxiety   . Chest pain    Admitted 11/2011: cath with mild nonobstructive coronary plaque, normal EF, negative cardiac enzymes and negative d-dimer.  . Diabetes mellitus   . Fatty liver disease, nonalcoholic 5366  . GERD (gastroesophageal reflux disease)   . Hypercholesterolemia   . Hypertension   . Reflux   . TMJ dysfunction    Past Surgical History:  Procedure Laterality Date  . ABDOMINAL HYSTERECTOMY  2009   total  . BILATERAL SALPINGOOPHORECTOMY  2009   benign ovarian tumor  . HERNIA REPAIR     umbilical  . IMAGE GUIDED SINUS SURGERY N/A 10/22/2015   Procedure: IMAGE GUIDED SINUS SURGERY;  Surgeon: Carloyn Manner, MD;  Location: [facility] ORS;  Service: ENT;  Laterality: N/A;  . KNEE SURGERY Bilateral   . LEFT HEART CATHETERIZATION WITH CORONARY ANGIOGRAM N/A 12/09/2011   Procedure: LEFT HEART CATHETERIZATION WITH  CORONARY ANGIOGRAM;  Surgeon: Minus Breeding, MD;  Location: [facility] C[facility] LAB;  Service: Cardiovascular;  Laterality: N/A;  . SPHENOIDECTOMY Left 10/22/2015   Procedure: SPHENOIDECTOMY;  Surgeon: Carloyn Manner, MD;  Location: [facility] ORS;  Service: ENT;  Laterality: Left;  . SUBMANDIBULAR MASS EXCISION  1996   [facility]    Family History  Problem Relation Age of Onset  . Heart disease Father        Father had CABG in his 70s  . Colon polyps Father   . Lung cancer Mother 53  . Breast cancer Paternal Grandmother 38  . Arthritis/Rheumatoid Paternal Grandfather   . Breast cancer Paternal Aunt 57   Social History   Socioeconomic History  . Marital status: Married    Spouse name: Not on file  . Number of children: 2  . Years of education: Not on file  . Highest education level: Not on file  Occupational History  . Not on file  Tobacco Use  . Smoking status: Never Smoker  . Smokeless tobacco: Never Used  Vaping Use  . Vaping Use: Never used  Substance and Sexual Activity  . Alcohol use: No    Alcohol/week: 0.0 standard drinks  . Drug use: No  . Sexual activity: Not on file  Other Topics Concern  . Not on file  Social History Narrative  . Not on file   Social Determinants of Health   Financial Resource Strain: Not on file  Food Insecurity: Not on file  Transportation Needs: Not on file  Physical Activity: Not on file  Stress: Not on file  Social Connections: Not on file    Outpatient Encounter Medications as of 03/24/2020  Medication Sig  . acetaminophen (TYLENOL) 500 MG tablet Take 1,000 mg by mouth every 6 (six) hours as needed for mild pain.  Marland Kitchen acyclovir ointment (ZOVIRAX) 5 % Apply 1 application topically every 3 (three) hours.  Marland Kitchen albuterol (VENTOLIN HFA) 108 (90 Base) MCG/ACT inhaler Inhale 2 puffs into the lungs every 6 (six) hours as needed for wheezing or shortness of breath.  . Blood Glucose Monitoring Suppl (ONE TOUCH ULTRA 2) w/Device KIT U D TO CHECK BLOOD SUGARS  .  cetirizine (ZYRTEC) 10 MG tablet Take 10 mg by mouth daily.  . cyclobenzaprine (FLEXERIL) 10 MG tablet Take 0.5-1 tablets (5-10 mg total) by mouth at bedtime as needed for muscle spasms.  Marland Kitchen escitalopram (LEXAPRO) 20 MG tablet TAKE 1 TABLET(20 MG) BY MOUTH DAILY  . esomeprazole (NEXIUM) 20 MG capsule Take 20 mg by mouth daily at 12 noon.  Marland Kitchen estradiol (ESTRACE) 1 MG tablet TAKE 1 TABLET(1 MG) BY MOUTH DAILY  . FARXIGA 10 MG TABS tablet TAKE 1 TABLET BY MOUTH EVERY MORNING  . Fe Fum-FePoly-Vit C-Vit B3 (INTEGRA) 62.5-62.5-40-3 MG CAPS Take 1 capsule by mouth 2 (two) times daily.  . fluconazole (DIFLUCAN) 150 MG tablet Take 1 pill today. If no improvement- may repeat pill in 3 days.  . fluconazole (DIFLUCAN) 150 MG tablet Take one tablet x 1.  If persistent symptoms after three days may repeat x 1.  . fluticasone (FLONASE) 50 MCG/ACT nasal spray Place 2 sprays into both nostrils daily.  . fluticasone (FLOVENT HFA) 110 MCG/ACT inhaler Inhale 2 puffs into the lungs in the morning and at bedtime.  Marland Kitchen glucose blood test strip Use as directed to check blood sugars twice daily. Dx E11.9  . lisinopril (ZESTRIL) 20 MG tablet TAKE 1 TABLET(20 MG) BY MOUTH DAILY  . Melatonin 10 MG TABS Take 10 mg by mouth at bedtime as needed (sleep).  . meloxicam (MOBIC) 15 MG tablet Take 15 mg by mouth daily.  . metFORMIN (GLUCOPHAGE) 1000 MG tablet TAKE 1 TABLET(1000 MG) BY MOUTH TWICE DAILY WITH A MEAL  . mupirocin ointment (BACTROBAN) 2 % Place 1 application into the nose 2 (two) times daily.  . nitrofurantoin, macrocrystal-monohydrate, (MACROBID) 100 MG capsule Take 1 capsule (100 mg total) by mouth 2 (two) times daily.  . Probiotic Product (PHILLIPS COLON HEALTH PO) Take 1 tablet by mouth daily as needed (colon health).   . rosuvastatin (CRESTOR) 20 MG tablet TAKE 1 TABLET(20 MG) BY MOUTH DAILY  . TRULICITY 1.5 BS/9.6GE SOPN inject 1.5 milligrams subcutaneously every 7 days  . valACYclovir (VALTREX) 1000 MG tablet Take  1 tablet (1,000 mg total) by mouth 2 (two) times daily.  . [DISCONTINUED] estradiol (ESTRACE) 1 MG tablet TAKE 1 TABLET(1 MG) BY MOUTH DAILY   No facility-administered encounter medications on file as of 03/24/2020.    Review of Systems  Constitutional: Negative for appetite change and unexpected weight change.  HENT: Positive for postnasal drip. Negative for congestion, sinus pressure and sore throat.   Respiratory: Negative for cough, chest tightness and shortness of breath.   Cardiovascular: Negative for chest pain, palpitations and leg swelling.  Gastrointestinal: Negative for abdominal pain, diarrhea, nausea and vomiting.  Genitourinary: Negative for difficulty urinating and dysuria.  Musculoskeletal: Negative for joint swelling and myalgias.  Skin: Negative for color change and rash.  Neurological: Negative for dizziness, light-headedness and headaches.  Psychiatric/Behavioral: Negative for agitation and dysphoric mood.       Objective:    Physical Exam Vitals reviewed.  Constitutional:      General: She is not in acute distress.    Appearance: Normal appearance.  HENT:     Head: Normocephalic and atraumatic.     Right Ear: External ear normal.     Left Ear: External ear normal.     Mouth/Throat:     Mouth: Oropharynx is clear and moist.  Eyes:     General: No scleral icterus.       Right eye: No discharge.        Left eye: No discharge.     Conjunctiva/sclera: Conjunctivae normal.  Neck:     Thyroid: No thyromegaly.  Cardiovascular:     Rate and Rhythm: Normal rate and regular rhythm.  Pulmonary:     Effort: No respiratory distress.     Breath sounds: Normal breath sounds. No wheezing.  Abdominal:     General: Bowel sounds are normal.     Palpations: Abdomen is soft.     Tenderness: There is no abdominal tenderness.  Musculoskeletal:        General: No swelling, tenderness or edema.     Cervical back: Neck supple. No tenderness.  Lymphadenopathy:      Cervical: No cervical adenopathy.  Skin:    Findings: No erythema or rash.  Neurological:     Mental Status: She is alert.  Psychiatric:        Mood and Affect: Mood normal.        Behavior: Behavior normal.     BP 122/70   Pulse 83   Temp 98.3 F (36.8 C) (Oral)   Resp 16   Ht 5' 4"  (1.626 m)   Wt 192 lb (87.1 kg)   SpO2 98%   BMI 32.96 kg/m  Wt Readings from Last 3 Encounters:  03/24/20 192 lb (87.1 kg)  03/18/20 193 lb (87.5 kg)  12/24/19 197 lb (89.4 kg)     Lab Results  Component Value Date   WBC 10.0 08/15/2019   HGB 13.6 08/15/2019   HCT 41.7 08/15/2019   PLT 211.0 08/15/2019   GLUCOSE 96 03/24/2020   CHOL 142 03/24/2020   TRIG 101.0 03/24/2020   HDL 65.40 03/24/2020   LDLDIRECT 93.0 11/07/2018   LDLCALC 56 03/24/2020   ALT 12 03/24/2020   AST 10 03/24/2020   NA 139 03/24/2020   K 4.4 03/24/2020   CL 103 03/24/2020   CREATININE 0.62 03/24/2020   BUN 10 03/24/2020   CO2 31 03/24/2020   TSH 1.98 03/24/2020   HGBA1C 6.4 03/24/2020   MICROALBUR 0.7 04/11/2019       Assessment & Plan:   Problem List Items Addressed This Visit    Anemia    Follow cbc.       Asthma    Continue flovent.  Has albuterol if needed.  Continue zyrtec.  Breathing stable       CAD (coronary artery disease)    Currently asyptomatic.  Continue risk factor modification.       Diabetes mellitus ([place])    Low carb diet and exercise.  Sugars improved - as outlined.  Continue metformin and trulicity.       GERD (gastroesophageal reflux disease)    No significant upper symptoms reported on nexium.  Follow.  Hypercholesterolemia    On crestor now.  Check lipid panel and liver function tests today.  Follow.       Hypertension    Blood pressure doing well.  Continue lisinopril.  Follow pressures.        Sleep apnea    CPAP.           Einar Pheasant, MD

## 2020-03-26 ENCOUNTER — Ambulatory Visit: Payer: BC Managed Care – PPO | Admitting: Internal Medicine

## 2020-03-29 ENCOUNTER — Encounter: Payer: Self-pay | Admitting: Internal Medicine

## 2020-03-29 NOTE — Assessment & Plan Note (Signed)
Low carb diet and exercise.  Sugars improved - as outlined.  Continue metformin and trulicity.

## 2020-03-29 NOTE — Assessment & Plan Note (Signed)
Continue flovent.  Has albuterol if needed.  Continue zyrtec.  Breathing stable

## 2020-03-29 NOTE — Assessment & Plan Note (Signed)
No significant upper symptoms reported on nexium.  Follow.

## 2020-03-29 NOTE — Assessment & Plan Note (Signed)
Currently asyptomatic.  Continue risk factor modification.

## 2020-03-29 NOTE — Assessment & Plan Note (Signed)
Blood pressure doing well.  Continue lisinopril.  Follow pressures.

## 2020-03-29 NOTE — Assessment & Plan Note (Signed)
CPAP.  

## 2020-03-29 NOTE — Assessment & Plan Note (Signed)
On crestor now.  Check lipid panel and liver function tests today.  Follow.

## 2020-03-29 NOTE — Assessment & Plan Note (Signed)
Follow cbc.  

## 2020-04-07 LAB — HM DIABETES EYE EXAM

## 2020-04-09 ENCOUNTER — Encounter: Payer: Self-pay | Admitting: Internal Medicine

## 2020-05-04 ENCOUNTER — Other Ambulatory Visit: Payer: Self-pay | Admitting: Internal Medicine

## 2020-05-18 ENCOUNTER — Telehealth: Payer: Self-pay | Admitting: Pulmonary Disease

## 2020-05-18 MED ORDER — NYSTATIN 100000 UNIT/ML MT SUSP: 5.0000 mL | Freq: Four times a day (QID) | OROMUCOSAL | 0 refills | Status: AC

## 2020-05-18 MED ORDER — MONTELUKAST SODIUM 10 MG PO TABS: 10.0000 mg | ORAL_TABLET | Freq: Every day | ORAL | 2 refills | Status: DC

## 2020-05-18 NOTE — Telephone Encounter (Signed)
Patient stated that she stopped using flovent 67mo ago due to sx. She resumed flovent 1 week ago and sx returned.  First available in [place] is not until April. Patient does not wish to travel to [facility] [place] [facility].   Dr. Craige Cotta, please advise. Thanks.  Is April visit okay, or should patient be seen sooner?

## 2020-05-18 NOTE — Telephone Encounter (Signed)
Called and spoke to patient.  Patient reports of raw throat, mouth and tongue. Denied white patches or sores.  She is gargling and rinsing her mouth after each use of Flovent.   Dr. Craige Cotta, please advise. Thanks

## 2020-05-18 NOTE — Telephone Encounter (Signed)
Patient is aware of below recommendations/message.  Rx for Singulair and Nystatin has been sent to preferred pharmacy.  Pending OV for 07/02/2020. Nothing further needed at this time.

## 2020-05-18 NOTE — Telephone Encounter (Signed)
Okay.  This make more sense that her symptoms are related to flovent use.    She should stop using flovent.  Can send script for nystatin swish and swallow 5 ml qid for 5 days.    Can send script for montelukast 10 mg qhs to treat her asthma and this will take the place of maintenance therapy with flovent.  She can then get scheduled for ROV in April to assess how she is doing with change in her regimen.

## 2020-05-18 NOTE — Telephone Encounter (Signed)
Did her issues just start?  She should have been on flovent since September 2021.  She should have an ROV to determine if there is something else causing her symptoms.

## 2020-05-28 ENCOUNTER — Other Ambulatory Visit: Payer: Self-pay | Admitting: Internal Medicine

## 2020-06-08 ENCOUNTER — Other Ambulatory Visit: Payer: Self-pay | Admitting: Internal Medicine

## 2020-06-08 ENCOUNTER — Encounter: Payer: Self-pay | Admitting: Internal Medicine

## 2020-06-09 ENCOUNTER — Other Ambulatory Visit: Payer: Self-pay

## 2020-06-09 MED ORDER — INTEGRA 62.5-62.5-40-3 MG PO CAPS: 1.0000 | ORAL_CAPSULE | Freq: Two times a day (BID) | ORAL | 1 refills | Status: DC

## 2020-06-17 ENCOUNTER — Other Ambulatory Visit: Payer: Self-pay | Admitting: Internal Medicine

## 2020-06-23 ENCOUNTER — Ambulatory Visit: Payer: BC Managed Care – PPO | Admitting: Internal Medicine

## 2020-06-23 ENCOUNTER — Other Ambulatory Visit (HOSPITAL_COMMUNITY)
Admission: RE | Admit: 2020-06-23 | Discharge: 2020-06-23 | Disposition: A | Discharge status: Home / Self Care | Payer: BC Managed Care – PPO | Source: Ambulatory Visit | Attending: Internal Medicine | Admitting: Internal Medicine

## 2020-06-23 ENCOUNTER — Encounter: Payer: Self-pay | Admitting: Internal Medicine

## 2020-06-23 ENCOUNTER — Other Ambulatory Visit: Payer: Self-pay

## 2020-06-23 VITALS — BP 122/78 | HR 86 | Temp 97.7°F | Ht 64.0 in | Wt 197.4 lb

## 2020-06-23 DIAGNOSIS — E7800 Pure hypercholesterolemia, unspecified: Secondary | ICD-10-CM

## 2020-06-23 DIAGNOSIS — G4730 Sleep apnea, unspecified: Secondary | ICD-10-CM | POA: Diagnosis not present

## 2020-06-23 DIAGNOSIS — I10 Essential (primary) hypertension: Secondary | ICD-10-CM

## 2020-06-23 DIAGNOSIS — E1159 Type 2 diabetes mellitus with other circulatory complications: Secondary | ICD-10-CM

## 2020-06-23 DIAGNOSIS — I25118 Atherosclerotic heart disease of native coronary artery with other forms of angina pectoris: Secondary | ICD-10-CM | POA: Diagnosis not present

## 2020-06-23 DIAGNOSIS — Z8616 Personal history of COVID-19: Secondary | ICD-10-CM

## 2020-06-23 DIAGNOSIS — K219 Gastro-esophageal reflux disease without esophagitis: Secondary | ICD-10-CM

## 2020-06-23 DIAGNOSIS — M542 Cervicalgia: Secondary | ICD-10-CM

## 2020-06-23 DIAGNOSIS — N760 Acute vaginitis: Secondary | ICD-10-CM

## 2020-06-23 DIAGNOSIS — D649 Anemia, unspecified: Secondary | ICD-10-CM

## 2020-06-23 MED ORDER — NYSTATIN 100000 UNIT/GM EX CREA: 1.0000 "application " | TOPICAL_CREAM | Freq: Two times a day (BID) | CUTANEOUS | 0 refills | Status: AC

## 2020-06-23 MED ORDER — CYCLOBENZAPRINE HCL 10 MG PO TABS: ORAL_TABLET | ORAL | 0 refills | Status: DC

## 2020-06-23 NOTE — Progress Notes (Signed)
Patient ID: Evelyn James, female   DOB: [birth year], 60 y.o.   MRN: [id number]   Subjective:    Patient ID: Evelyn James, female    DOB: 07/13/1960, 60 y.o.   MRN: [id number]  HPI This visit occurred during the SARS-CoV-2 public health emergency.  Safety protocols were in place, including screening questions prior to the visit, additional usage of staff PPE, and extensive cleaning of exam room while observing appropriate contact time as indicated for disinfecting solutions.  Patient here for a scheduled follow up. Had covid previously with resulting residual cough.  Never required oxygen or hospitalization. Saw pulmonary.  Had PFTs that revealed normal lung function with no airflow obstruction or restriction.  No significant bronchodilator response.  Was using flovent.  Off now.  Developed thrush.  No significant cough now.  Prescribed singulair.  Notes some memory issues after covid.  Some fatigue.  She reports snoring and wakes up through the night.  Feels needs new cpap.  Has had hers since 2008..  Interested in being reevaluated.  Has some neck issues. Takes flexeril prn.  Has noticed some vaginal itching - inside and outside (vaginal area).  No chest pain.  No increased sob.  States am sugars 90s.  Night time sugars 90s.  Blood sugar 130 last night and 110 this am.  No abdominal pain.  Bowels stable.    Past Medical History:  Diagnosis Date  . Anemia    Noted 11/2011  . Anxiety   . Chest pain    Admitted 11/2011: cath with mild nonobstructive coronary plaque, normal EF, negative cardiac enzymes and negative d-dimer.  . Diabetes mellitus   . Fatty liver disease, nonalcoholic 3382  . GERD (gastroesophageal reflux disease)   . Hypercholesterolemia   . Hypertension   . Reflux   . TMJ dysfunction    Past Surgical History:  Procedure Laterality Date  . ABDOMINAL HYSTERECTOMY  2009   total  . BILATERAL SALPINGOOPHORECTOMY  2009   benign ovarian tumor  . HERNIA REPAIR     umbilical  .  IMAGE GUIDED SINUS SURGERY N/A 10/22/2015   Procedure: IMAGE GUIDED SINUS SURGERY;  Surgeon: Carloyn Manner, MD;  Location: [facility] ORS;  Service: ENT;  Laterality: N/A;  . KNEE SURGERY Bilateral   . LEFT HEART CATHETERIZATION WITH CORONARY ANGIOGRAM N/A 12/09/2011   Procedure: LEFT HEART CATHETERIZATION WITH CORONARY ANGIOGRAM;  Surgeon: Minus Breeding, MD;  Location: [facility] CATH LAB;  Service: Cardiovascular;  Laterality: N/A;  . SPHENOIDECTOMY Left 10/22/2015   Procedure: SPHENOIDECTOMY;  Surgeon: Carloyn Manner, MD;  Location: [facility] ORS;  Service: ENT;  Laterality: Left;  . SUBMANDIBULAR MASS EXCISION  1996   [facility]    Family History  Problem Relation Age of Onset  . Heart disease Father        Father had CABG in his 2s  . Colon polyps Father   . Lung cancer Mother 3  . Breast cancer Paternal Grandmother 46  . Arthritis/Rheumatoid Paternal Grandfather   . Breast cancer Paternal Aunt 68   Social History   Socioeconomic History  . Marital status: Married    Spouse name: Not on file  . Number of children: 2  . Years of education: Not on file  . Highest education level: Not on file  Occupational History  . Not on file  Tobacco Use  . Smoking status: Never Smoker  . Smokeless tobacco: Never Used  Vaping Use  . Vaping Use: Never used  Substance and Sexual Activity  .  Alcohol use: No    Alcohol/week: 0.0 standard drinks  . Drug use: No  . Sexual activity: Not on file  Other Topics Concern  . Not on file  Social History Narrative  . Not on file   Social Determinants of Health   Financial Resource Strain: Not on file  Food Insecurity: Not on file  Transportation Needs: Not on file  Physical Activity: Not on file  Stress: Not on file  Social Connections: Not on file    Outpatient Encounter Medications as of 06/23/2020  Medication Sig  . acetaminophen (TYLENOL) 500 MG tablet Take 1,000 mg by mouth every 6 (six) hours as needed for mild pain.  Marland Kitchen acyclovir ointment (ZOVIRAX)  5 % Apply 1 application topically every 3 (three) hours.  Marland Kitchen albuterol (VENTOLIN HFA) 108 (90 Base) MCG/ACT inhaler Inhale 2 puffs into the lungs every 6 (six) hours as needed for wheezing or shortness of breath.  . Blood Glucose Monitoring Suppl (ONE TOUCH ULTRA 2) w/Device KIT U D TO CHECK BLOOD SUGARS  . cetirizine (ZYRTEC) 10 MG tablet Take 10 mg by mouth daily.  Marland Kitchen escitalopram (LEXAPRO) 20 MG tablet TAKE 1 TABLET(20 MG) BY MOUTH DAILY  . esomeprazole (NEXIUM) 20 MG capsule Take 20 mg by mouth daily at 12 noon.  Marland Kitchen estradiol (ESTRACE) 1 MG tablet TAKE 1 TABLET(1 MG) BY MOUTH DAILY  . FARXIGA 10 MG TABS tablet TAKE 1 TABLET BY MOUTH EVERY MORNING  . Fe Fum-FePoly-Vit C-Vit B3 (INTEGRA) 62.5-62.5-40-3 MG CAPS Take 1 capsule by mouth 2 (two) times daily.  . fluconazole (DIFLUCAN) 150 MG tablet Take one tablet x 1.  If persistent symptoms after three days may repeat x 1.  . fluticasone (FLONASE) 50 MCG/ACT nasal spray Place 2 sprays into both nostrils daily.  Marland Kitchen glucose blood test strip Use as directed to check blood sugars twice daily. Dx E11.9  . lisinopril (ZESTRIL) 20 MG tablet TAKE 1 TABLET(20 MG) BY MOUTH DAILY  . Melatonin 10 MG TABS Take 10 mg by mouth at bedtime as needed (sleep).  . meloxicam (MOBIC) 15 MG tablet Take 15 mg by mouth daily.  . montelukast (SINGULAIR) 10 MG tablet Take 1 tablet (10 mg total) by mouth daily.  . mupirocin ointment (BACTROBAN) 2 % Place 1 application into the nose 2 (two) times daily.  Marland Kitchen nystatin cream (MYCOSTATIN) Apply 1 application topically 2 (two) times daily.  . Probiotic Product (PHILLIPS COLON HEALTH PO) Take 1 tablet by mouth daily as needed (colon health).   . rosuvastatin (CRESTOR) 20 MG tablet TAKE 1 TABLET(20 MG) BY MOUTH DAILY  . TRULICITY 1.5 YT/0.1SW SOPN inject 1.5 milligrams subcutaneously every 7 days  . valACYclovir (VALTREX) 1000 MG tablet Take 1 tablet (1,000 mg total) by mouth 2 (two) times daily.  . [DISCONTINUED] cyclobenzaprine  (FLEXERIL) 10 MG tablet Take 0.5-1 tablets (5-10 mg total) by mouth at bedtime as needed for muscle spasms.  . [DISCONTINUED] fluconazole (DIFLUCAN) 150 MG tablet Take 1 pill today. If no improvement- may repeat pill in 3 days.  . [DISCONTINUED] fluticasone (FLOVENT HFA) 110 MCG/ACT inhaler Inhale 2 puffs into the lungs in the morning and at bedtime.  . [DISCONTINUED] metFORMIN (GLUCOPHAGE) 1000 MG tablet TAKE 1 TABLET(1000 MG) BY MOUTH TWICE DAILY WITH A MEAL  . [DISCONTINUED] nitrofurantoin, macrocrystal-monohydrate, (MACROBID) 100 MG capsule Take 1 capsule (100 mg total) by mouth 2 (two) times daily.  . cyclobenzaprine (FLEXERIL) 10 MG tablet 1/2 tablet q hs prn (do not take without wearing  cpap)   No facility-administered encounter medications on file as of 06/23/2020.    Review of Systems  Constitutional: Positive for fatigue. Negative for appetite change and unexpected weight change.  HENT: Negative for congestion and sinus pressure.   Respiratory: Negative for chest tightness and shortness of breath.        No significant cough now.   Cardiovascular: Negative for chest pain, palpitations and leg swelling.  Gastrointestinal: Negative for abdominal pain, diarrhea, nausea and vomiting.  Genitourinary: Negative for difficulty urinating and dysuria.       Vaginal irritation.   Musculoskeletal: Positive for neck pain. Negative for joint swelling and myalgias.  Skin: Negative for color change and rash.  Neurological: Negative for dizziness, light-headedness and headaches.  Psychiatric/Behavioral: Negative for agitation and dysphoric mood.       Objective:    Physical Exam Vitals reviewed.  Constitutional:      General: She is not in acute distress.    Appearance: Normal appearance.  HENT:     Head: Normocephalic and atraumatic.     Right Ear: External ear normal.     Left Ear: External ear normal.  Eyes:     General: No scleral icterus.       Right eye: No discharge.         Left eye: No discharge.  Neck:     Thyroid: No thyromegaly.  Cardiovascular:     Rate and Rhythm: Normal rate and regular rhythm.  Pulmonary:     Effort: No respiratory distress.     Breath sounds: Normal breath sounds. No wheezing.  Abdominal:     General: Bowel sounds are normal.     Palpations: Abdomen is soft.     Tenderness: There is no abdominal tenderness.  Genitourinary:    Comments: Normal external genitalia.  Vaginal vault without lesions.  Discharge present.   Could not appreciate any adnexal masses or tenderness.   Musculoskeletal:        General: No swelling or tenderness.     Cervical back: Neck supple. No tenderness.  Lymphadenopathy:     Cervical: No cervical adenopathy.  Skin:    Findings: No erythema or rash.  Neurological:     Mental Status: She is alert.  Psychiatric:        Mood and Affect: Mood normal.        Behavior: Behavior normal.     BP 122/78   Pulse 86   Temp 97.7 F (36.5 C)   Ht _0  (1.626 m)   Wt 197 lb 6.4 oz (89.5 kg)   SpO2 98%   BMI 33.88 kg/m  Wt Readings from Last 3 Encounters:  06/23/20 197 lb 6.4 oz (89.5 kg)  03/24/20 192 lb (87.1 kg)  03/18/20 193 lb (87.5 kg)     Lab Results  Component Value Date   WBC 10.0 08/15/2019   HGB 13.6 08/15/2019   HCT 41.7 08/15/2019   PLT 211.0 08/15/2019   GLUCOSE 96 03/24/2020   CHOL 142 03/24/2020   TRIG 101.0 03/24/2020   HDL 65.40 03/24/2020   LDLDIRECT 93.0 11/07/2018   LDLCALC 56 03/24/2020   ALT 12 03/24/2020   AST 10 03/24/2020   NA 139 03/24/2020   K 4.4 03/24/2020   CL 103 03/24/2020   CREATININE 0.62 03/24/2020   BUN 10 03/24/2020   CO2 31 03/24/2020   TSH 1.98 03/24/2020   HGBA1C 6.4 03/24/2020   MICROALBUR 0.7 04/11/2019       Assessment &  Plan:   Problem List Items Addressed This Visit    Acute vaginitis - Primary    Vaginal irritation as outlined.  Nystatin cream as directed.  KOH/wet prep obtained.        Relevant Orders   Cervicovaginal ancillary  only( [place]) (Completed)   Anemia    Follow cbc.       Relevant Orders   CBC with Differential/Platelet   CAD (coronary artery disease)    Continue risk factor modification.  Currently asymptomatic.       Diabetes mellitus with circulatory complication (HCC)    Low carb diet and exercise.  Sugars mostly in 90s. On trulicity and metformin.  Will hold metformin.  Follow sugars.  Check met b and a1c.       Relevant Orders   Hemoglobin Z7B   Basic metabolic panel   Microalbumin / creatinine urine ratio   GERD (gastroesophageal reflux disease)    No upper symptoms reported.  On nexium.       History of COVID-19    Saw pulmonary.  W/up as outlined. Cough is better.  Breathing stable.  Follow.       Hypercholesterolemia    Continue crestor.  Low cholesterol diet and exercise.  Follow lipid panel and liver function tests.        Relevant Orders   Hepatic function panel   Lipid panel   TSH   Hypertension    Continue lisinopril.  Blood pressure doing well.  Follow pressures.  Follow metabolic panel.       Neck pain    Intermittent flares.  Request refill flexeril.  Refilled. Discussed not taking if she is not wearing cpap.  Follow.       Sleep apnea    Has known sleep apnea.  Received her cpap 2008.  Snoring and waking up frequently.  Feels needs reevaluation.  Seeing pulmonary.  D/w pulmonary regarding f/u sleep study.  Interested in home sleep study.            Einar Pheasant, MD

## 2020-06-24 ENCOUNTER — Other Ambulatory Visit: Payer: Self-pay | Admitting: Internal Medicine

## 2020-06-24 LAB — CERVICOVAGINAL ANCILLARY ONLY
Bacterial Vaginitis (gardnerella): NEGATIVE
Candida Glabrata: POSITIVE — AB
Candida Vaginitis: NEGATIVE
Comment: NEGATIVE
Comment: NEGATIVE
Comment: NEGATIVE

## 2020-06-29 ENCOUNTER — Encounter: Payer: Self-pay | Admitting: Internal Medicine

## 2020-06-29 DIAGNOSIS — M542 Cervicalgia: Secondary | ICD-10-CM | POA: Insufficient documentation

## 2020-06-29 NOTE — Assessment & Plan Note (Signed)
Saw pulmonary.  W/up as outlined. Cough is better.  Breathing stable.  Follow.

## 2020-06-29 NOTE — Assessment & Plan Note (Signed)
Intermittent flares.  Request refill flexeril.  Refilled. Discussed not taking if she is not wearing cpap.  Follow.

## 2020-06-29 NOTE — Assessment & Plan Note (Signed)
Continue crestor.  Low cholesterol diet and exercise. Follow lipid panel and liver function tests.   

## 2020-06-29 NOTE — Assessment & Plan Note (Signed)
Continue risk factor modification.  Currently asymptomatic.  

## 2020-06-29 NOTE — Assessment & Plan Note (Signed)
No upper symptoms reported.  On nexium.   

## 2020-06-29 NOTE — Assessment & Plan Note (Addendum)
Low carb diet and exercise.  Sugars mostly in 90s. On trulicity and metformin.  Will hold metformin.  Follow sugars.  Check met b and a1c.

## 2020-06-29 NOTE — Assessment & Plan Note (Signed)
Vaginal irritation as outlined.  Nystatin cream as directed.  KOH/wet prep obtained.

## 2020-06-29 NOTE — Assessment & Plan Note (Signed)
Continue lisinopril.  Blood pressure doing well.  Follow pressures.  Follow metabolic panel.  

## 2020-06-29 NOTE — Assessment & Plan Note (Signed)
Follow cbc.  

## 2020-06-29 NOTE — Assessment & Plan Note (Signed)
Has known sleep apnea.  Received her cpap 2008.  Snoring and waking up frequently.  Feels needs reevaluation.  Seeing pulmonary.  D/w pulmonary regarding f/u sleep study.  Interested in home sleep study.

## 2020-06-30 ENCOUNTER — Other Ambulatory Visit: Payer: Self-pay | Admitting: Internal Medicine

## 2020-06-30 ENCOUNTER — Telehealth: Payer: Self-pay

## 2020-06-30 ENCOUNTER — Encounter: Payer: Self-pay | Admitting: Internal Medicine

## 2020-06-30 MED ORDER — BORIC ACID VAGINAL 600 MG VA SUPP: 1.0000 | Freq: Every day | VAGINAL | 0 refills | Status: DC

## 2020-06-30 NOTE — Telephone Encounter (Signed)
I called [facility] and the only pharmacy around here that does that is [facility] (in [place] I believe).  Would she be agreeable to go to [facility]. If so, I can call in rx to [facility].

## 2020-06-30 NOTE — Telephone Encounter (Signed)
The Boric acid suppositories are compoundmedication [facility] does not do them they say patient needs another medication called in if appropriate or this would need to go to a compound pharmacy.

## 2020-06-30 NOTE — Telephone Encounter (Signed)
[employer] had it patient has ordered. Called patient back per PCP advice and she agreed to [employer] she will cancel [facility]. I added pharmacy and pended medication, [facility] canceled.

## 2020-06-30 NOTE — Progress Notes (Signed)
rx sent in for boric acid suppositories.

## 2020-06-30 NOTE — Telephone Encounter (Signed)
LMTCB in regards to lab results.  

## 2020-07-02 ENCOUNTER — Ambulatory Visit (INDEPENDENT_AMBULATORY_CARE_PROVIDER_SITE_OTHER): Payer: BC Managed Care – PPO | Admitting: Pulmonary Disease

## 2020-07-02 ENCOUNTER — Other Ambulatory Visit: Payer: Self-pay

## 2020-07-02 ENCOUNTER — Encounter: Payer: Self-pay | Admitting: Pulmonary Disease

## 2020-07-02 VITALS — BP 108/70 | HR 86 | Temp 97.8°F | Ht 62.0 in | Wt 197.0 lb

## 2020-07-02 DIAGNOSIS — G4733 Obstructive sleep apnea (adult) (pediatric): Secondary | ICD-10-CM

## 2020-07-02 DIAGNOSIS — J4530 Mild persistent asthma, uncomplicated: Secondary | ICD-10-CM

## 2020-07-02 NOTE — Patient Instructions (Signed)
Will arrange for home sleep study Will call to arrange for follow up after sleep study reviewed  

## 2020-07-02 NOTE — Progress Notes (Signed)
[facility]  Chief Complaint  Patient presents with  . Follow-up    Asthma- pt states she's feeling well.    Constitutional:  BP 108/70 (BP Location: Left Arm, Patient Position: Sitting, Cuff Size: Normal)   Pulse 86   Temp 97.8 F (36.6 C) (Temporal)   Ht 5' 2"  (1.575 m)   Wt 197 lb (89.4 kg)   SpO2 98%   BMI 36.03 kg/m   Past Medical History:  COVID 19 infection February 2021 and February 2022, Anemia, Anxiety, DM, Fatty liver, GERD, HLD, TMJ, Obstructive sleep apnea  Past Surgical History:  Her  has a past surgical history that includes Submandibular mass excision (1996); Knee surgery (Bilateral); Bilateral salpingoophorectomy (2009); left heart catheterization with coronary angiogram (N/A, 12/09/2011); Sphenoidectomy (Left, 10/22/2015); Image guided sinus surgery (N/A, 10/22/2015); Hernia repair; and Abdominal hysterectomy (2009).  Brief Summary:  Evelyn James is a 60 y.o. female with asthma, allergic rhinitis, and obstructive sleep apnea.      Subjective:   Her PFT from October was normal.  She was using flovent.  Caused terrible throat irritation and had to stop.  Switched to [employer].  This has helped with allergy symptoms and cough.  Only has to use albuterol couple times per week.  Not having sputum, wheeze, chest discomfort or dyspnea.  She got COVID again in February 2022.  She is feeling more fatigued and has brain fog.  She is snoring, and wakes up feeling like her throat has closed.  She is very restless at night and has to use the bathroom several times per night.  She goes to bed at 8 pm after taking melatonin.  She wakes up at 9 am.  She has to take naps during the day.  Her Epworth score is 10 out of 24.   Physical Exam:   Appearance - well kempt   ENMT - no sinus tenderness, no oral exudate, no LAN, Mallampati 3 airway, no stridor, decreased AP diameter  Respiratory - equal breath sounds bilaterally, no  wheezing or rales  CV - s1s2 regular rate and rhythm, no murmurs  Ext - no clubbing, no edema  Skin - no rashes  Psych - normal mood and affect   Pulmonary testing:   PFT 12/16/19 >> FEV1 2.57 (102%), FEV1% 91, TLC 4.54 (91%), DLCO 126%  Sleep testing:    Social History:  She  reports that she has never smoked. She has never used smokeless tobacco. She reports that she does not drink alcohol and does not use drugs.  Family History:  Her family history includes Arthritis/Rheumatoid in her paternal grandfather; Breast cancer (age of onset: 68) in her paternal grandmother; Breast cancer (age of onset: 71) in her paternal aunt; Colon polyps in her father; Heart disease in her father; Lung cancer (age of onset: 75) in her mother.     Assessment/Plan:   Mild, persistent asthma. - intolerant of flovent due to throat irritation - continue singulair - prn albuterol  Seasonal allergic rhinitis. - continue singulair, flonase, zyrtec  ACE inhibitor use. - cough improved with asthma and allergy therapy - okay to continue ACE inhibitor use  Obstructive sleep apnea. - she has history of sleep apnea - hasn't been able to use CPAP for almost 1 year; her machine is quite old and she no longer has a DME  - she has snoring, apnea, sleep disruption, and daytime sleepiness - she has history of mood disorder - I am  concerned she still has significant obstructive sleep apnea - will arrange for home sleep study, and then likely resume CPAP therapy  Time Spent Involved in Patient Care on Day of Examination:  32 minutes  Follow up:  Patient Instructions  Will arrange for home sleep study Will call to arrange for follow up after sleep study reviewed   Medication List:   Allergies as of 07/02/2020      Reactions   Sulfa Antibiotics Other (See Comments)   Topical--red itchy; oral--makes mouth raw Bleeding sores in mouth. Raw mouth and sores on gums      Medication List        Accurate as of July 02, 2020 12:23 PM. If you have any questions, ask your nurse or doctor.        acetaminophen 500 MG tablet Commonly known as: TYLENOL Take 1,000 mg by mouth every 6 (six) hours as needed for mild pain.   acyclovir ointment 5 % Commonly known as: Zovirax Apply 1 application topically every 3 (three) hours.   albuterol 108 (90 Base) MCG/ACT inhaler Commonly known as: VENTOLIN HFA Inhale 2 puffs into the lungs every 6 (six) hours as needed for wheezing or shortness of breath.   Boric Acid Vaginal 600 MG Supp Place 1 suppository vaginally daily.   cetirizine 10 MG tablet Commonly known as: ZYRTEC Take 10 mg by mouth daily.   cyclobenzaprine 10 MG tablet Commonly known as: FLEXERIL 1/2 tablet q hs prn (do not take without wearing cpap)   escitalopram 20 MG tablet Commonly known as: LEXAPRO TAKE 1 TABLET(20 MG) BY MOUTH DAILY   esomeprazole 20 MG capsule Commonly known as: NEXIUM Take 20 mg by mouth daily at 12 noon.   estradiol 1 MG tablet Commonly known as: ESTRACE TAKE 1 TABLET(1 MG) BY MOUTH DAILY   Farxiga 10 MG Tabs tablet Generic drug: dapagliflozin propanediol TAKE 1 TABLET BY MOUTH EVERY MORNING   fluconazole 150 MG tablet Commonly known as: DIFLUCAN Take one tablet x 1.  If persistent symptoms after three days may repeat x 1.   fluticasone 50 MCG/ACT nasal spray Commonly known as: FLONASE Place 2 sprays into both nostrils daily.   glucose blood test strip Use as directed to check blood sugars twice daily. Dx E11.9   Integra 62.5-62.5-40-3 MG Caps Take 1 capsule by mouth 2 (two) times daily.   lisinopril 20 MG tablet Commonly known as: ZESTRIL TAKE 1 TABLET(20 MG) BY MOUTH DAILY   Melatonin 10 MG Tabs Take 10 mg by mouth at bedtime as needed (sleep).   meloxicam 15 MG tablet Commonly known as: MOBIC Take 15 mg by mouth daily.   montelukast 10 MG tablet Commonly known as: SINGULAIR Take 1 tablet (10 mg total) by mouth  daily.   mupirocin ointment 2 % Commonly known as: BACTROBAN Place 1 application into the nose 2 (two) times daily.   nystatin cream Commonly known as: MYCOSTATIN Apply 1 application topically 2 (two) times daily.   ONE TOUCH ULTRA 2 w/Device Kit U D TO CHECK BLOOD SUGARS   PHILLIPS COLON HEALTH PO Take 1 tablet by mouth daily as needed (colon health).   rosuvastatin 20 MG tablet Commonly known as: CRESTOR TAKE 1 TABLET(20 MG) BY MOUTH DAILY   Trulicity 1.5 TM/1.9QQ Sopn Generic drug: Dulaglutide inject 1.5 milligrams subcutaneously every 7 days   valACYclovir 1000 MG tablet Commonly known as: VALTREX Take 1 tablet (1,000 mg total) by mouth 2 (two) times daily.  Signature:  Chesley Mires, MD [place] Pager - [phone] 07/02/2020, 12:23 PM

## 2020-07-05 ENCOUNTER — Encounter: Payer: Self-pay | Admitting: Internal Medicine

## 2020-07-20 ENCOUNTER — Other Ambulatory Visit: Payer: Self-pay | Admitting: Pulmonary Disease

## 2020-07-27 ENCOUNTER — Other Ambulatory Visit: Payer: Self-pay

## 2020-08-04 ENCOUNTER — Other Ambulatory Visit: Payer: Self-pay | Admitting: Internal Medicine

## 2020-08-12 ENCOUNTER — Ambulatory Visit: Payer: BC Managed Care – PPO

## 2020-08-12 ENCOUNTER — Other Ambulatory Visit: Payer: Self-pay

## 2020-08-12 DIAGNOSIS — G4733 Obstructive sleep apnea (adult) (pediatric): Secondary | ICD-10-CM | POA: Diagnosis not present

## 2020-08-17 ENCOUNTER — Telehealth: Payer: Self-pay | Admitting: Pulmonary Disease

## 2020-08-17 DIAGNOSIS — G4733 Obstructive sleep apnea (adult) (pediatric): Secondary | ICD-10-CM | POA: Diagnosis not present

## 2020-08-17 NOTE — Telephone Encounter (Signed)
Patient returned phone call, name and birth date confirmed. Went over [employer] results per Dr Craige Cotta with patient. Patient expressed full understanding. Scheduled televisit with NP, due to no current openings in [place] office, for Wednesday 08/19/20 at 4pm. Phone number to use for visit confirmed with patient. Patient agreeable with time and date. Nothing further needed at this time.

## 2020-08-17 NOTE — Telephone Encounter (Signed)
Called and left message on voicemail to please return phone call to go over HST results. Contact number provided.  

## 2020-08-17 NOTE — Telephone Encounter (Signed)
HST 08/12/20 >> AHI 15.6, SpO2 low 84%   Please inform her that her sleep study shows moderate obstructive sleep apnea.  Please arrange for ROV with me or NP to discuss treatment options.

## 2020-08-19 ENCOUNTER — Encounter: Payer: Self-pay | Admitting: Primary Care

## 2020-08-19 ENCOUNTER — Ambulatory Visit (INDEPENDENT_AMBULATORY_CARE_PROVIDER_SITE_OTHER): Payer: BC Managed Care – PPO | Admitting: Primary Care

## 2020-08-19 ENCOUNTER — Other Ambulatory Visit: Payer: Self-pay

## 2020-08-19 DIAGNOSIS — G4733 Obstructive sleep apnea (adult) (pediatric): Secondary | ICD-10-CM

## 2020-08-19 NOTE — Progress Notes (Signed)
Virtual Visit via Telephone Note  I connected with Evelyn James on 08/19/20 at  4:00 PM EDT by telephone and verified that I am speaking with the correct person using two identifiers.  Location: Patient: Home Provider: Office   I discussed the limitations, risks, security and privacy concerns of performing an evaluation and management service by telephone and the availability of in person appointments. I also discussed with the patient that there may be a patient responsible charge related to this service. The patient expressed understanding and agreed to proceed.   History of Present Illness: 60 year old female, never smoked. PMH significant for HTN, asthma, sleep apnea, GERD. Patient of Dr. Craige Cotta, last seen on 07/02/20.  08/19/2020 - Interim hx  Patient contacted today to review recent sleep study results. Symptoms of daytime fatigue, snoring and restless sleep. HST on 08/12/20 showed moderate OSA, AHI 15.6/hr with SpO2 low 84%. We reviewed treatment options including weight loss, oral appliance, CPAP therapy or referral to ENT. She has previously used CPAP therapy and felt significantly better. She is wondering if she can get an oral appliance while waiting for new CPAP machine.   Observations/Objective:  - Able to speak in full sentences; no overt shortness of breath or wheezing   Assessment and Plan:  Moderate OSA: - HST 08/12/20 >> AHI 15.6, SpO2 low 85% - Clinical symptoms of fatigue, snoring and restless sleep - We reviewed treatment options, she is interested in resuming CPAP therapy - Placing order for new CPAP machine auto titrate 5-15cm h20 - Encourage weight loss efforts, advised against driving if experiencing daytime somnolence   Follow Up Instructions: - Recall placed for 3 months/  - Patient to contact our office once she gets new machine to set up follow-up visit  I discussed the assessment and treatment plan with the patient. The patient was provided an opportunity to  ask questions and all were answered. The patient agreed with the plan and demonstrated an understanding of the instructions.   The patient was advised to call back or seek an in-person evaluation if the symptoms worsen or if the condition fails to improve as anticipated.  I provided 22 minutes of non-face-to-face time during this encounter.   Glenford Bayley, NP

## 2020-08-19 NOTE — Patient Instructions (Signed)
HST 08/12/20 >> AHI 15.6, SpO2 low 85% (moderate obstructive sleep apnea)  - Placing order for new CPAP machine auto titrate 5-15cm h20 - Encourage weight loss efforts, advised against driving if experiencing daytime somnolence  - Patient to contact our office once she gets new machine to set up follow-up visit

## 2020-08-20 ENCOUNTER — Other Ambulatory Visit (INDEPENDENT_AMBULATORY_CARE_PROVIDER_SITE_OTHER): Payer: BC Managed Care – PPO

## 2020-08-20 DIAGNOSIS — E7800 Pure hypercholesterolemia, unspecified: Secondary | ICD-10-CM | POA: Diagnosis not present

## 2020-08-20 DIAGNOSIS — E1159 Type 2 diabetes mellitus with other circulatory complications: Secondary | ICD-10-CM | POA: Diagnosis not present

## 2020-08-20 DIAGNOSIS — D649 Anemia, unspecified: Secondary | ICD-10-CM

## 2020-08-20 LAB — CBC WITH DIFFERENTIAL/PLATELET
Basophils Absolute: 0 10*3/uL (ref 0.0–0.1)
Basophils Relative: 0.5 % (ref 0.0–3.0)
Eosinophils Absolute: 0.2 10*3/uL (ref 0.0–0.7)
Eosinophils Relative: 2.1 % (ref 0.0–5.0)
HCT: 41.5 % (ref 36.0–46.0)
Hemoglobin: 13.4 g/dL (ref 12.0–15.0)
Lymphocytes Relative: 34.7 % (ref 12.0–46.0)
Lymphs Abs: 3.3 10*3/uL (ref 0.7–4.0)
MCHC: 32.4 g/dL (ref 30.0–36.0)
MCV: 89.6 fl (ref 78.0–100.0)
Monocytes Absolute: 0.5 10*3/uL (ref 0.1–1.0)
Monocytes Relative: 5.7 % (ref 3.0–12.0)
Neutro Abs: 5.4 10*3/uL (ref 1.4–7.7)
Neutrophils Relative %: 57 % (ref 43.0–77.0)
Platelets: 233 10*3/uL (ref 150.0–400.0)
RBC: 4.63 Mil/uL (ref 3.87–5.11)
RDW: 13.8 % (ref 11.5–15.5)
WBC: 9.4 10*3/uL (ref 4.0–10.5)

## 2020-08-20 LAB — BASIC METABOLIC PANEL
BUN: 12 mg/dL (ref 6–23)
CO2: 32 mEq/L (ref 19–32)
Calcium: 9 mg/dL (ref 8.4–10.5)
Chloride: 102 mEq/L (ref 96–112)
Creatinine, Ser: 0.66 mg/dL (ref 0.40–1.20)
GFR: 94.64 mL/min (ref >60.00)
Glucose, Bld: 108 mg/dL — ABNORMAL HIGH (ref 70–99)
Potassium: 4.2 mEq/L (ref 3.5–5.1)
Sodium: 139 mEq/L (ref 135–145)

## 2020-08-20 LAB — LIPID PANEL
Cholesterol: 170 mg/dL (ref 0–200)
HDL: 68.1 mg/dL (ref >39.00)
LDL Cholesterol: 81 mg/dL (ref 0–99)
NonHDL: 101.96
Total CHOL/HDL Ratio: 2
Triglycerides: 103 mg/dL (ref 0.0–149.0)
VLDL: 20.6 mg/dL (ref 0.0–40.0)

## 2020-08-20 LAB — HEPATIC FUNCTION PANEL
ALT: 14 U/L (ref 0–35)
AST: 12 U/L (ref 0–37)
Albumin: 3.9 g/dL (ref 3.5–5.2)
Alkaline Phosphatase: 60 U/L (ref 39–117)
Bilirubin, Direct: 0.1 mg/dL (ref 0.0–0.3)
Total Bilirubin: 0.3 mg/dL (ref 0.2–1.2)
Total Protein: 6.9 g/dL (ref 6.0–8.3)

## 2020-08-20 LAB — MICROALBUMIN / CREATININE URINE RATIO
Creatinine,U: 79.4 mg/dL
Microalb Creat Ratio: 1.5 mg/g (ref 0.0–30.0)
Microalb, Ur: 1.2 mg/dL (ref 0.0–1.9)

## 2020-08-20 LAB — HEMOGLOBIN A1C: Hgb A1c MFr Bld: 6.9 % — ABNORMAL HIGH (ref 4.6–6.5)

## 2020-08-20 LAB — TSH: TSH: 2.72 u[IU]/mL (ref 0.35–4.50)

## 2020-08-20 NOTE — Progress Notes (Signed)
Reviewed and agree with assessment/plan.   Coralyn Helling, MD [facility] 08/20/2020, 8:35 AM Pager:  [phone]

## 2020-08-24 ENCOUNTER — Ambulatory Visit: Payer: BC Managed Care – PPO | Admitting: Internal Medicine

## 2020-08-24 ENCOUNTER — Other Ambulatory Visit: Payer: Self-pay

## 2020-08-24 VITALS — BP 128/74 | HR 91 | Temp 98.1°F | Resp 16 | Ht 62.0 in | Wt 198.8 lb

## 2020-08-24 DIAGNOSIS — G4730 Sleep apnea, unspecified: Secondary | ICD-10-CM

## 2020-08-24 DIAGNOSIS — E1159 Type 2 diabetes mellitus with other circulatory complications: Secondary | ICD-10-CM | POA: Diagnosis not present

## 2020-08-24 DIAGNOSIS — J4530 Mild persistent asthma, uncomplicated: Secondary | ICD-10-CM

## 2020-08-24 DIAGNOSIS — E7800 Pure hypercholesterolemia, unspecified: Secondary | ICD-10-CM

## 2020-08-24 DIAGNOSIS — M25561 Pain in right knee: Secondary | ICD-10-CM

## 2020-08-24 DIAGNOSIS — I25118 Atherosclerotic heart disease of native coronary artery with other forms of angina pectoris: Secondary | ICD-10-CM | POA: Diagnosis not present

## 2020-08-24 DIAGNOSIS — K146 Glossodynia: Secondary | ICD-10-CM

## 2020-08-24 DIAGNOSIS — M25562 Pain in left knee: Secondary | ICD-10-CM

## 2020-08-24 DIAGNOSIS — I10 Essential (primary) hypertension: Secondary | ICD-10-CM | POA: Diagnosis not present

## 2020-08-24 DIAGNOSIS — K219 Gastro-esophageal reflux disease without esophagitis: Secondary | ICD-10-CM

## 2020-08-24 DIAGNOSIS — D649 Anemia, unspecified: Secondary | ICD-10-CM

## 2020-08-24 MED ORDER — CLOTRIMAZOLE 10 MG MT TROC: 10.0000 mg | Freq: Three times a day (TID) | OROMUCOSAL | 0 refills | Status: DC

## 2020-08-24 NOTE — Progress Notes (Signed)
Patient ID: Evelyn James, female   DOB: 1960-08-25, 60 y.o.   MRN: [id number]   Subjective:    Patient ID: Evelyn James, female    DOB: 1960/06/12, 60 y.o.   MRN: [id number]  HPI This visit occurred during the SARS-CoV-2 public health emergency.  Safety protocols were in place, including screening questions prior to the visit, additional usage of staff PPE, and extensive cleaning of exam room while observing appropriate contact time as indicated for disinfecting solutions.   Patient here for a scheduled follow up.  Here to follow up regarding her diabetes, cholesterol and blood pressure.  She reports she is doing relatively well.  Is having problems with thrush.  Had previously.  Resolved with diflucan.  Noticed return of symptoms 08/15/20 - described as raw throat and irritation in her mouth and tongue.  No problems eating or swallowing food.  No dysphagia.  No acid reflux.  Breathing stable.  No increased cough or congestion.  Occasionally will have to use her albuterol inhaler.  No chest pain or sob reported.  No abdominal pain.  Bowels moving.  Not sleeping well.  Wakes up.  Waiting on her cpap.  Discussed exercise and avoiding screen time prior to bed.  Discussed not lying supine.  Received cortisone injection - bilateral knees.  Some better.  Following with ortho.  States sugars low 100s in am and 120 at night.  Discussed labs.  A1c 6.9.  increased from last check.    Past Medical History:  Diagnosis Date   Anemia    Noted 11/2011   Anxiety    Chest pain    Admitted 11/2011: cath with mild nonobstructive coronary plaque, normal EF, negative cardiac enzymes and negative d-dimer.   Diabetes mellitus    Fatty liver disease, nonalcoholic 9450   GERD (gastroesophageal reflux disease)    Hypercholesterolemia    Hypertension    Reflux    TMJ dysfunction    Past Surgical History:  Procedure Laterality Date   ABDOMINAL HYSTERECTOMY  2009   total   BILATERAL SALPINGOOPHORECTOMY  2009    benign ovarian tumor   HERNIA REPAIR     umbilical   IMAGE GUIDED SINUS SURGERY N/A 10/22/2015   Procedure: IMAGE GUIDED SINUS SURGERY;  Surgeon: Carloyn Manner, MD;  Location: [facility] ORS;  Service: ENT;  Laterality: N/A;   KNEE SURGERY Bilateral    LEFT HEART CATHETERIZATION WITH CORONARY ANGIOGRAM N/A 12/09/2011   Procedure: LEFT HEART CATHETERIZATION WITH CORONARY ANGIOGRAM;  Surgeon: Minus Breeding, MD;  Location: [facility] CATH LAB;  Service: Cardiovascular;  Laterality: N/A;   SPHENOIDECTOMY Left 10/22/2015   Procedure: SPHENOIDECTOMY;  Surgeon: Carloyn Manner, MD;  Location: [facility] ORS;  Service: ENT;  Laterality: Left;   SUBMANDIBULAR MASS EXCISION  1996   [facility]    Family History  Problem Relation Age of Onset   Heart disease Father        Father had CABG in his 9s   Colon polyps Father    Lung cancer Mother 37   Breast cancer Paternal Grandmother 90   Arthritis/Rheumatoid Paternal Grandfather    Breast cancer Paternal Aunt 67   Social History   Socioeconomic History   Marital status: Married    Spouse name: Not on file   Number of children: 2   Years of education: Not on file   Highest education level: Not on file  Occupational History   Not on file  Tobacco Use   Smoking status: Never   Smokeless  tobacco: Never  Vaping Use   Vaping Use: Never used  Substance and Sexual Activity   Alcohol use: No    Alcohol/week: 0.0 standard drinks   Drug use: No   Sexual activity: Not on file  Other Topics Concern   Not on file  Social History Narrative   Not on file   Social Determinants of Health   Financial Resource Strain: Not on file  Food Insecurity: Not on file  Transportation Needs: Not on file  Physical Activity: Not on file  Stress: Not on file  Social Connections: Not on file    Outpatient Encounter Medications as of 08/24/2020  Medication Sig   clotrimazole (MYCELEX) 10 MG troche Take 1 tablet (10 mg total) by mouth 3 (three) times daily.   acetaminophen  (TYLENOL) 500 MG tablet Take 1,000 mg by mouth every 6 (six) hours as needed for mild pain.   acyclovir ointment (ZOVIRAX) 5 % Apply 1 application topically every 3 (three) hours.   albuterol (VENTOLIN HFA) 108 (90 Base) MCG/ACT inhaler Inhale 2 puffs into the lungs every 6 (six) hours as needed for wheezing or shortness of breath.   Blood Glucose Monitoring Suppl (ONE TOUCH ULTRA 2) w/Device KIT U D TO CHECK BLOOD SUGARS   Boric Acid Vaginal 600 MG SUPP Place 1 suppository vaginally daily.   cetirizine (ZYRTEC) 10 MG tablet Take 10 mg by mouth daily.   cyclobenzaprine (FLEXERIL) 10 MG tablet 1/2 tablet q hs prn (do not take without wearing cpap)   esomeprazole (NEXIUM) 20 MG capsule Take 20 mg by mouth daily at 12 noon.   estradiol (ESTRACE) 1 MG tablet TAKE 1 TABLET(1 MG) BY MOUTH DAILY   FARXIGA 10 MG TABS tablet TAKE 1 TABLET BY MOUTH EVERY MORNING   Fe Fum-FePoly-Vit C-Vit B3 (INTEGRA) 62.5-62.5-40-3 MG CAPS Take 1 capsule by mouth 2 (two) times daily.   fluticasone (FLONASE) 50 MCG/ACT nasal spray Place 2 sprays into both nostrils daily.   glucose blood test strip Use as directed to check blood sugars twice daily. Dx E11.9   Melatonin 10 MG TABS Take 10 mg by mouth at bedtime as needed (sleep).   meloxicam (MOBIC) 15 MG tablet Take 15 mg by mouth daily.   montelukast (SINGULAIR) 10 MG tablet TAKE 1 TABLET(10 MG) BY MOUTH DAILY   mupirocin ointment (BACTROBAN) 2 % Place 1 application into the nose 2 (two) times daily.   nystatin cream (MYCOSTATIN) Apply 1 application topically 2 (two) times daily.   Probiotic Product (PHILLIPS COLON HEALTH PO) Take 1 tablet by mouth daily as needed (colon health).    rosuvastatin (CRESTOR) 20 MG tablet TAKE 1 TABLET(20 MG) BY MOUTH DAILY   TRULICITY 1.5 HA/1.9FX SOPN ADMINISTER 1.5 MG UNDER THE SKIN EVERY 7 DAYS   valACYclovir (VALTREX) 1000 MG tablet Take 1 tablet (1,000 mg total) by mouth 2 (two) times daily.   [DISCONTINUED] escitalopram (LEXAPRO) 20 MG  tablet TAKE 1 TABLET(20 MG) BY MOUTH DAILY   [DISCONTINUED] fluconazole (DIFLUCAN) 150 MG tablet Take one tablet x 1.  If persistent symptoms after three days may repeat x 1.   [DISCONTINUED] lisinopril (ZESTRIL) 20 MG tablet TAKE 1 TABLET(20 MG) BY MOUTH DAILY   No facility-administered encounter medications on file as of 08/24/2020.    Review of Systems  Constitutional:  Negative for appetite change and unexpected weight change.  HENT:  Negative for congestion and sinus pressure.        Raw irritated mouth.   Respiratory:  Negative for cough, chest tightness and shortness of breath.   Cardiovascular:  Negative for chest pain, palpitations and leg swelling.  Gastrointestinal:  Negative for abdominal pain, diarrhea, nausea and vomiting.  Genitourinary:  Negative for difficulty urinating and dysuria.  Musculoskeletal:  Negative for joint swelling and myalgias.       S/p bilateral knee injections.   Skin:  Negative for color change and rash.  Neurological:  Negative for dizziness, light-headedness and headaches.  Psychiatric/Behavioral:  Negative for agitation and dysphoric mood.       Objective:    Physical Exam Vitals reviewed.  Constitutional:      General: She is not in acute distress.    Appearance: Normal appearance.  HENT:     Head: Normocephalic and atraumatic.     Mouth/Throat:     Comments: Irritated - erythema - posterior OP.  Minimal thrush - tongue.  Eyes:     General: No scleral icterus.       Right eye: No discharge.        Left eye: No discharge.     Conjunctiva/sclera: Conjunctivae normal.  Neck:     Thyroid: No thyromegaly.  Cardiovascular:     Rate and Rhythm: Normal rate and regular rhythm.  Pulmonary:     Effort: No respiratory distress.     Breath sounds: Normal breath sounds. No wheezing.  Abdominal:     General: Bowel sounds are normal.     Palpations: Abdomen is soft.     Tenderness: There is no abdominal tenderness.  Musculoskeletal:         General: No swelling or tenderness.     Cervical back: Neck supple. No tenderness.  Lymphadenopathy:     Cervical: No cervical adenopathy.  Skin:    Findings: No erythema or rash.  Neurological:     Mental Status: She is alert.  Psychiatric:        Mood and Affect: Mood normal.        Behavior: Behavior normal.    BP 128/74   Pulse 91   Temp 98.1 F (36.7 C)   Resp 16   Ht 5' 2"  (1.575 m)   Wt 198 lb 12.8 oz (90.2 kg)   SpO2 98%   BMI 36.36 kg/m  Wt Readings from Last 3 Encounters:  08/24/20 198 lb 12.8 oz (90.2 kg)  07/02/20 197 lb (89.4 kg)  06/23/20 197 lb 6.4 oz (89.5 kg)     Lab Results  Component Value Date   WBC 9.4 08/20/2020   HGB 13.4 08/20/2020   HCT 41.5 08/20/2020   PLT 233.0 08/20/2020   GLUCOSE 108 (H) 08/20/2020   CHOL 170 08/20/2020   TRIG 103.0 08/20/2020   HDL 68.10 08/20/2020   LDLDIRECT 93.0 11/07/2018   LDLCALC 81 08/20/2020   ALT 14 08/20/2020   AST 12 08/20/2020   NA 139 08/20/2020   K 4.2 08/20/2020   CL 102 08/20/2020   CREATININE 0.66 08/20/2020   BUN 12 08/20/2020   CO2 32 08/20/2020   TSH 2.72 08/20/2020   HGBA1C 6.9 (H) 08/20/2020   MICROALBUR 1.2 08/20/2020       Assessment & Plan:   Problem List Items Addressed This Visit     Anemia    Follow cbc.        Asthma    Uses albuterol prn.  Breathing stable.        CAD (coronary artery disease)    Continue risk factor modification.  Currently asymptomatic.  Diabetes mellitus with circulatory complication (HCC)    Low carb diet and exercise.  Sugars mostly in 90s. On trulicity and metformin.  Follow sugars.  Check met b and a1c.  Has adjusted diet.  Lost weight.  Wants to hold on additional medication.  Follow.         Relevant Orders   Hemoglobin A1c   GERD (gastroesophageal reflux disease)    No upper symptoms reported.  On nexium.        Hypercholesterolemia    Continue crestor.  Low cholesterol diet and exercise.  Follow lipid panel and liver  function tests.   Lab Results  Component Value Date   CHOL 170 08/20/2020   HDL 68.10 08/20/2020   LDLCALC 81 08/20/2020   LDLDIRECT 93.0 11/07/2018   TRIG 103.0 08/20/2020   CHOLHDL 2 08/20/2020         Relevant Orders   Lipid panel   Hypertension - Primary    Continue lisinopril.  Blood pressure doing well.  Follow pressures.  Follow metabolic panel.        Relevant Orders   Basic metabolic panel   Hepatic function panel   Knee pain    Has seen ortho.  S/p injection.        Sleep apnea    Saw pulmonary.  Waiting on cpap supplies - to start using.  Avoid sleeping supine.  Avoid sedating medication.  Follow.         Soreness of tongue    Appears to have recurring thrush.  mycelex troches as directed.  Follow.  Notify me if persistent or if recurs.           Einar Pheasant, MD

## 2020-08-25 ENCOUNTER — Other Ambulatory Visit: Payer: Self-pay | Admitting: Internal Medicine

## 2020-08-30 ENCOUNTER — Encounter: Payer: Self-pay | Admitting: Internal Medicine

## 2020-08-30 DIAGNOSIS — K146 Glossodynia: Secondary | ICD-10-CM | POA: Insufficient documentation

## 2020-08-30 NOTE — Assessment & Plan Note (Signed)
Uses albuterol prn.  Breathing stable.

## 2020-08-30 NOTE — Assessment & Plan Note (Signed)
Continue lisinopril.  Blood pressure doing well.  Follow pressures.  Follow metabolic panel.  

## 2020-08-30 NOTE — Assessment & Plan Note (Signed)
Continue risk factor modification.  Currently asymptomatic.  

## 2020-08-30 NOTE — Assessment & Plan Note (Signed)
Follow cbc.  

## 2020-08-30 NOTE — Assessment & Plan Note (Signed)
Has seen ortho.  S/p injection.  

## 2020-08-30 NOTE — Assessment & Plan Note (Signed)
Low carb diet and exercise.  Sugars mostly in 90s. On trulicity and metformin.  Follow sugars.  Check met b and a1c.  Has adjusted diet.  Lost weight.  Wants to hold on additional medication.  Follow.

## 2020-08-30 NOTE — Assessment & Plan Note (Signed)
Continue crestor.  Low cholesterol diet and exercise.  Follow lipid panel and liver function tests.   Lab Results  Component Value Date   CHOL 170 08/20/2020   HDL 68.10 08/20/2020   LDLCALC 81 08/20/2020   LDLDIRECT 93.0 11/07/2018   TRIG 103.0 08/20/2020   CHOLHDL 2 08/20/2020

## 2020-08-30 NOTE — Assessment & Plan Note (Signed)
Saw pulmonary.  Waiting on cpap supplies - to start using.  Avoid sleeping supine.  Avoid sedating medication.  Follow.

## 2020-08-30 NOTE — Assessment & Plan Note (Signed)
No upper symptoms reported.  On nexium.   

## 2020-08-30 NOTE — Assessment & Plan Note (Signed)
Appears to have recurring thrush.  mycelex troches as directed.  Follow.  Notify me if persistent or if recurs.

## 2020-09-02 ENCOUNTER — Other Ambulatory Visit: Payer: Self-pay | Admitting: Internal Medicine

## 2020-09-08 ENCOUNTER — Other Ambulatory Visit: Payer: Self-pay | Admitting: Internal Medicine

## 2020-09-09 MED ORDER — DAPAGLIFLOZIN PROPANEDIOL 10 MG PO TABS: 10.0000 mg | ORAL_TABLET | Freq: Every morning | ORAL | 1 refills | Status: DC

## 2020-09-09 NOTE — Telephone Encounter (Signed)
Rx sent in for farxiga #90  with one refill.

## 2020-10-14 ENCOUNTER — Telehealth: Payer: Self-pay | Admitting: Primary Care

## 2020-10-14 NOTE — Telephone Encounter (Signed)
Attempted to call pt but unable to reach. Left message for her to return call. 

## 2020-10-14 NOTE — Telephone Encounter (Addendum)
-----   Message from Coralyn Helling, MD sent at 08/20/2020  8:29 AM EDT ----- Regarding: RE: oral appliance/CPAP I have seen patients who use both oral appliance and CPAP at the same time.  The purpose of this would be to allow for lower CPAP settings in more difficult to control sleep apnea patients.  This doesn't happen very often particularly because it is difficult to anyone to pay for both set ups.  Otherwise, I recommend against switching between CPAP and oral appliance.  First, if they aren't using CPAP consistently, then they run the risk of having insurance deny coverage for continue CPAP use.  Second and more importantly, it takes time for someone to get used to wearing an oral appliance.  I don't know if you ever had braces when you were younger.  But it's like when you finally get used to wearing braces, then you go to see the orthodontist and you get an adjustment.  Your mouth feels sore for days.  Similar kind of thing if someone doesn't use oral appliance consistently.  [place]  ----- Message ----- From: Glenford Bayley, NP Sent: 08/19/2020   5:49 PM EDT To: Coralyn Helling, MD Subject: oral appliance/CPAP                            Are you aware if a patient can get both oral appliance and cpap -Beth

## 2020-10-14 NOTE — Telephone Encounter (Signed)
Can you call patient and see if she has heard from DME about CPAP machine? I spoke with Dr. Craige Cotta and he did not overtly recommend getting both. It can be costly to the patient, insurance may deny coverage of CPAP and it takes time to get used to oral appliance. If she would like both we can place referral to orthodontics but we may run into some difficulties down the line

## 2020-10-19 ENCOUNTER — Other Ambulatory Visit: Payer: Self-pay | Admitting: Pulmonary Disease

## 2020-10-21 NOTE — Telephone Encounter (Signed)
Called and spoke with pt letting her know info stated by [facility] after speaking with Dr. Craige Cotta about CPAP and oral appliance. Pt verbalized understanding. Nothing further needed.

## 2020-11-02 ENCOUNTER — Other Ambulatory Visit: Payer: Self-pay | Admitting: Internal Medicine

## 2020-11-24 ENCOUNTER — Other Ambulatory Visit (INDEPENDENT_AMBULATORY_CARE_PROVIDER_SITE_OTHER): Payer: BC Managed Care – PPO

## 2020-11-24 ENCOUNTER — Other Ambulatory Visit: Payer: Self-pay

## 2020-11-24 DIAGNOSIS — E7800 Pure hypercholesterolemia, unspecified: Secondary | ICD-10-CM

## 2020-11-24 DIAGNOSIS — E1159 Type 2 diabetes mellitus with other circulatory complications: Secondary | ICD-10-CM | POA: Diagnosis not present

## 2020-11-24 DIAGNOSIS — I10 Essential (primary) hypertension: Secondary | ICD-10-CM

## 2020-11-24 LAB — HEPATIC FUNCTION PANEL
ALT: 17 U/L (ref 0–35)
AST: 11 U/L (ref 0–37)
Albumin: 4 g/dL (ref 3.5–5.2)
Alkaline Phosphatase: 52 U/L (ref 39–117)
Bilirubin, Direct: 0.1 mg/dL (ref 0.0–0.3)
Total Bilirubin: 0.3 mg/dL (ref 0.2–1.2)
Total Protein: 6.5 g/dL (ref 6.0–8.3)

## 2020-11-24 LAB — LIPID PANEL
Cholesterol: 143 mg/dL (ref 0–200)
HDL: 61.4 mg/dL (ref >39.00)
LDL Cholesterol: 57 mg/dL (ref 0–99)
NonHDL: 81.97
Total CHOL/HDL Ratio: 2
Triglycerides: 127 mg/dL (ref 0.0–149.0)
VLDL: 25.4 mg/dL (ref 0.0–40.0)

## 2020-11-24 LAB — BASIC METABOLIC PANEL
BUN: 13 mg/dL (ref 6–23)
CO2: 32 mEq/L (ref 19–32)
Calcium: 9.3 mg/dL (ref 8.4–10.5)
Chloride: 100 mEq/L (ref 96–112)
Creatinine, Ser: 0.63 mg/dL (ref 0.40–1.20)
GFR: 95.53 mL/min (ref >60.00)
Glucose, Bld: 118 mg/dL — ABNORMAL HIGH (ref 70–99)
Potassium: 4 mEq/L (ref 3.5–5.1)
Sodium: 140 mEq/L (ref 135–145)

## 2020-11-24 LAB — HEMOGLOBIN A1C: Hgb A1c MFr Bld: 7.1 % — ABNORMAL HIGH (ref 4.6–6.5)

## 2020-11-26 ENCOUNTER — Other Ambulatory Visit: Payer: Self-pay

## 2020-11-26 ENCOUNTER — Ambulatory Visit (INDEPENDENT_AMBULATORY_CARE_PROVIDER_SITE_OTHER): Payer: BC Managed Care – PPO | Admitting: Internal Medicine

## 2020-11-26 ENCOUNTER — Encounter: Payer: Self-pay | Admitting: Internal Medicine

## 2020-11-26 VITALS — BP 128/74 | HR 83 | Temp 97.6°F | Resp 16 | Ht 62.0 in | Wt 208.0 lb

## 2020-11-26 DIAGNOSIS — G4730 Sleep apnea, unspecified: Secondary | ICD-10-CM

## 2020-11-26 DIAGNOSIS — J4530 Mild persistent asthma, uncomplicated: Secondary | ICD-10-CM

## 2020-11-26 DIAGNOSIS — Z0000 Encounter for general adult medical examination without abnormal findings: Secondary | ICD-10-CM | POA: Diagnosis not present

## 2020-11-26 DIAGNOSIS — I10 Essential (primary) hypertension: Secondary | ICD-10-CM | POA: Diagnosis not present

## 2020-11-26 DIAGNOSIS — E7800 Pure hypercholesterolemia, unspecified: Secondary | ICD-10-CM

## 2020-11-26 DIAGNOSIS — D649 Anemia, unspecified: Secondary | ICD-10-CM

## 2020-11-26 DIAGNOSIS — E1159 Type 2 diabetes mellitus with other circulatory complications: Secondary | ICD-10-CM

## 2020-11-26 DIAGNOSIS — Z23 Encounter for immunization: Secondary | ICD-10-CM

## 2020-11-26 DIAGNOSIS — K219 Gastro-esophageal reflux disease without esophagitis: Secondary | ICD-10-CM

## 2020-11-26 DIAGNOSIS — M5430 Sciatica, unspecified side: Secondary | ICD-10-CM

## 2020-11-26 LAB — HM DIABETES FOOT EXAM

## 2020-11-26 MED ORDER — CYCLOBENZAPRINE HCL 10 MG PO TABS: ORAL_TABLET | ORAL | 0 refills | Status: DC

## 2020-11-26 MED ORDER — TRULICITY 3 MG/0.5ML ~~LOC~~ SOAJ: 3.0000 mg | SUBCUTANEOUS | 1 refills | Status: DC

## 2020-11-26 NOTE — Assessment & Plan Note (Addendum)
Physical today 11/26/20.  Colonoscopy 06/12/18.  Recommended f/u in 5 years.  Mammogram 02/19/20 - Birads I.

## 2020-11-26 NOTE — Progress Notes (Signed)
Patient ID: Evelyn James, female   DOB: [birth year], 61 y.o.   MRN: [id number]   Subjective:    Patient ID: Evelyn James, female    DOB: 03-15-1960, 60 y.o.   MRN: [id number]  This visit occurred during the SARS-CoV-2 public health emergency.  Safety protocols were in place, including screening questions prior to the visit, additional usage of staff PPE, and extensive cleaning of exam room while observing appropriate contact time as indicated for disinfecting solutions.   Patient here for her physical exam.    Chief Complaint  Patient presents with   Annual Exam   .   HPI She is doing relatively well.  Has been having problems recently with sciatica.  Back and knees bothering her.  Was on prednisone previously.  Seeing ortho.  Discussed PT.  She wants to try water exercise first. Sugars elevated during this time.  Had not been watching her diet as much.  Recently has started adjusting her diet.  Stopped snacking at night.  Discussed low carb diet.  Has just started back to the gym. Plans to start water exercise.  No chest pain or sob reported.  No abdominal pain.  Bowels moving.  States am sugars range 110-140.  PM sugars usually less than 140.     Past Medical History:  Diagnosis Date   Anemia    Noted 11/2011   Anxiety    Chest pain    Admitted 11/2011: cath with mild nonobstructive coronary plaque, normal EF, negative cardiac enzymes and negative d-dimer.   Diabetes mellitus    Fatty liver disease, nonalcoholic 0102   GERD (gastroesophageal reflux disease)    Hypercholesterolemia    Hypertension    Reflux    TMJ dysfunction    Past Surgical History:  Procedure Laterality Date   ABDOMINAL HYSTERECTOMY  2009   total   BILATERAL SALPINGOOPHORECTOMY  2009   benign ovarian tumor   HERNIA REPAIR     umbilical   IMAGE GUIDED SINUS SURGERY N/A 10/22/2015   Procedure: IMAGE GUIDED SINUS SURGERY;  Surgeon: Carloyn Manner, MD;  Location: [facility] ORS;  Service: ENT;  Laterality: N/A;    KNEE SURGERY Bilateral    LEFT HEART CATHETERIZATION WITH CORONARY ANGIOGRAM N/A 12/09/2011   Procedure: LEFT HEART CATHETERIZATION WITH CORONARY ANGIOGRAM;  Surgeon: Minus Breeding, MD;  Location: [facility] CATH LAB;  Service: Cardiovascular;  Laterality: N/A;   SPHENOIDECTOMY Left 10/22/2015   Procedure: SPHENOIDECTOMY;  Surgeon: Carloyn Manner, MD;  Location: [facility] ORS;  Service: ENT;  Laterality: Left;   SUBMANDIBULAR MASS EXCISION  1996   [facility]    Family History  Problem Relation Age of Onset   Heart disease Father        Father had CABG in his 54s   Colon polyps Father    Lung cancer Mother 56   Breast cancer Paternal Grandmother 6   Arthritis/Rheumatoid Paternal Grandfather    Breast cancer Paternal Aunt 15   Social History   Socioeconomic History   Marital status: Married    Spouse name: Not on file   Number of children: 2   Years of education: Not on file   Highest education level: Not on file  Occupational History   Not on file  Tobacco Use   Smoking status: Never   Smokeless tobacco: Never  Vaping Use   Vaping Use: Never used  Substance and Sexual Activity   Alcohol use: No    Alcohol/week: 0.0 standard drinks   Drug use: No  Sexual activity: Not on file  Other Topics Concern   Not on file  Social History Narrative   Not on file   Social Determinants of Health   Financial Resource Strain: Not on file  Food Insecurity: Not on file  Transportation Needs: Not on file  Physical Activity: Not on file  Stress: Not on file  Social Connections: Not on file     Review of Systems  Constitutional:  Negative for appetite change and unexpected weight change.  HENT:  Negative for congestion, sinus pressure and sore throat.   Eyes:  Negative for pain and visual disturbance.  Respiratory:  Negative for cough, chest tightness and shortness of breath.   Cardiovascular:  Negative for chest pain, palpitations and leg swelling.  Gastrointestinal:  Negative for abdominal  pain, diarrhea, nausea and vomiting.  Genitourinary:  Negative for difficulty urinating and dysuria.  Musculoskeletal:  Negative for joint swelling and myalgias.       Sciatica - as outlined.   Skin:  Negative for color change and rash.  Neurological:  Negative for dizziness, light-headedness and headaches.  Hematological:  Negative for adenopathy. Does not bruise/bleed easily.  Psychiatric/Behavioral:  Negative for decreased concentration and dysphoric mood.       Objective:     BP 128/74   Pulse 83   Temp 97.6 F (36.4 C)   Resp 16   Ht 5' 2"  (1.575 m)   Wt 208 lb (94.3 kg)   SpO2 97%   BMI 38.04 kg/m  Wt Readings from Last 3 Encounters:  12/04/20 212 lb 9.6 oz (96.4 kg)  11/26/20 208 lb (94.3 kg)  08/24/20 198 lb 12.8 oz (90.2 kg)    Physical Exam Vitals reviewed.  Constitutional:      General: She is not in acute distress.    Appearance: Normal appearance. She is well-developed.  HENT:     Head: Normocephalic and atraumatic.     Right Ear: External ear normal.     Left Ear: External ear normal.  Eyes:     General: No scleral icterus.       Right eye: No discharge.        Left eye: No discharge.     Conjunctiva/sclera: Conjunctivae normal.  Neck:     Thyroid: No thyromegaly.  Cardiovascular:     Rate and Rhythm: Normal rate and regular rhythm.  Pulmonary:     Effort: No tachypnea, accessory muscle usage or respiratory distress.     Breath sounds: Normal breath sounds. No decreased breath sounds or wheezing.  Chest:  Breasts:    Right: No inverted nipple, mass, nipple discharge or tenderness (no axillary adenopathy).     Left: No inverted nipple, mass, nipple discharge or tenderness (no axilarry adenopathy).  Abdominal:     General: Bowel sounds are normal.     Palpations: Abdomen is soft.     Tenderness: There is no abdominal tenderness.  Musculoskeletal:        General: No swelling or tenderness.     Cervical back: Neck supple.  Lymphadenopathy:      Cervical: No cervical adenopathy.  Skin:    Findings: No erythema or rash.  Neurological:     Mental Status: She is alert and oriented to person, place, and time.  Psychiatric:        Mood and Affect: Mood normal.        Behavior: Behavior normal.     Outpatient Encounter Medications as of 11/26/2020  Medication Sig  Dulaglutide (TRULICITY) 3 VC/9.4WH SOPN Inject 3 mg as directed once a week.   acetaminophen (TYLENOL) 500 MG tablet Take 1,000 mg by mouth every 6 (six) hours as needed for mild pain.   acyclovir ointment (ZOVIRAX) 5 % Apply 1 application topically every 3 (three) hours.   albuterol (VENTOLIN HFA) 108 (90 Base) MCG/ACT inhaler Inhale 2 puffs into the lungs every 6 (six) hours as needed for wheezing or shortness of breath.   Blood Glucose Monitoring Suppl (ONE TOUCH ULTRA 2) w/Device KIT U D TO CHECK BLOOD SUGARS   Boric Acid Vaginal 600 MG SUPP Place 1 suppository vaginally daily.   cetirizine (ZYRTEC) 10 MG tablet Take 10 mg by mouth daily.   clotrimazole (MYCELEX) 10 MG troche Take 1 tablet (10 mg total) by mouth 3 (three) times daily.   cyclobenzaprine (FLEXERIL) 10 MG tablet 1/2 tablet q hs prn (do not take without wearing cpap)   dapagliflozin propanediol (FARXIGA) 10 MG TABS tablet Take 1 tablet (10 mg total) by mouth every morning.   escitalopram (LEXAPRO) 20 MG tablet TAKE 1 TABLET(20 MG) BY MOUTH DAILY   esomeprazole (NEXIUM) 20 MG capsule Take 20 mg by mouth daily at 12 noon.   estradiol (ESTRACE) 1 MG tablet TAKE 1 TABLET(1 MG) BY MOUTH DAILY   Fe Fum-FePoly-Vit C-Vit B3 (INTEGRA) 62.5-62.5-40-3 MG CAPS Take 1 capsule by mouth 2 (two) times daily.   fluticasone (FLONASE) 50 MCG/ACT nasal spray Place 2 sprays into both nostrils daily.   glucose blood test strip Use as directed to check blood sugars twice daily. Dx E11.9   lisinopril (ZESTRIL) 20 MG tablet TAKE 1 TABLET(20 MG) BY MOUTH DAILY   Melatonin 10 MG TABS Take 10 mg by mouth at bedtime as needed (sleep).    meloxicam (MOBIC) 15 MG tablet Take 15 mg by mouth daily.   montelukast (SINGULAIR) 10 MG tablet TAKE 1 TABLET(10 MG) BY MOUTH DAILY   mupirocin ointment (BACTROBAN) 2 % Place 1 application into the nose 2 (two) times daily.   nystatin cream (MYCOSTATIN) Apply 1 application topically 2 (two) times daily.   Probiotic Product (PHILLIPS COLON HEALTH PO) Take 1 tablet by mouth daily as needed (colon health).    rosuvastatin (CRESTOR) 20 MG tablet TAKE 1 TABLET(20 MG) BY MOUTH DAILY   valACYclovir (VALTREX) 1000 MG tablet Take 1 tablet (1,000 mg total) by mouth 2 (two) times daily.   [DISCONTINUED] cyclobenzaprine (FLEXERIL) 10 MG tablet 1/2 tablet q hs prn (do not take without wearing cpap)   [DISCONTINUED] TRULICITY 1.5 QP/5.9FM SOPN ADMINISTER 1.5 MG UNDER THE SKIN EVERY 7 DAYS   No facility-administered encounter medications on file as of 11/26/2020.     Lab Results  Component Value Date   WBC 9.4 08/20/2020   HGB 13.4 08/20/2020   HCT 41.5 08/20/2020   PLT 233.0 08/20/2020   GLUCOSE 118 (H) 11/24/2020   CHOL 143 11/24/2020   TRIG 127.0 11/24/2020   HDL 61.40 11/24/2020   LDLDIRECT 93.0 11/07/2018   LDLCALC 57 11/24/2020   ALT 17 11/24/2020   AST 11 11/24/2020   NA 140 11/24/2020   K 4.0 11/24/2020   CL 100 11/24/2020   CREATININE 0.63 11/24/2020   BUN 13 11/24/2020   CO2 32 11/24/2020   TSH 2.72 08/20/2020   HGBA1C 7.1 (H) 11/24/2020   MICROALBUR 1.2 08/20/2020       Assessment & Plan:   Problem List Items Addressed This Visit     Anemia  Follow cbc.       Asthma    Breathing stable.       Diabetes mellitus with circulatory complication (HCC)    Low carb diet and exercise.  AM sugars 110-140.  <140 in the pm.  On trulicity.   Discussed increase to 82m. Follow sugars.  Check met b and a1c.  Wants to work on diet and exercise.  Follow.        Relevant Medications   Dulaglutide (TRULICITY) 3 MKE/0.9HASOPN   Other Relevant Orders   Hemoglobin A1c   GERD  (gastroesophageal reflux disease)    No upper symptoms reported.  On nexium.       Health care maintenance    Physical today 11/26/20.  Colonoscopy 06/12/18.  Recommended f/u in 5 years.  Mammogram 02/19/20 - Birads I.       Hypercholesterolemia    Continue crestor.  Low cholesterol diet and exercise.  Follow lipid panel and liver function tests.   Lab Results  Component Value Date   CHOL 143 11/24/2020   HDL 61.40 11/24/2020   LDLCALC 57 11/24/2020   LDLDIRECT 93.0 11/07/2018   TRIG 127.0 11/24/2020   CHOLHDL 2 11/24/2020        Relevant Orders   Hepatic function panel   Lipid panel   Hypertension    Continue lisinopril.  Blood pressure doing well.  Follow pressures.  Follow metabolic panel.       Relevant Orders   Basic metabolic panel   Sciatica    Saw ortho.  S/p prednisone taper.  Discussed PT.  Wants to try water exercises.  Notify me if persistent problems.       Relevant Medications   cyclobenzaprine (FLEXERIL) 10 MG tablet   Sleep apnea    Saw pulmonary.  Recommended cpap.       Other Visit Diagnoses     Routine general medical examination at a health care facility    -  Primary   Need for immunization against influenza       Relevant Orders   Flu Vaccine QUAD 666moM (Fluarix, Fluzone & Alfiuria Quad PF) (Completed)        ChEinar PheasantMD

## 2020-12-04 ENCOUNTER — Ambulatory Visit: Payer: BC Managed Care – PPO | Admitting: Pulmonary Disease

## 2020-12-04 ENCOUNTER — Encounter: Payer: Self-pay | Admitting: Pulmonary Disease

## 2020-12-04 ENCOUNTER — Other Ambulatory Visit: Payer: Self-pay

## 2020-12-04 VITALS — BP 122/80 | HR 80 | Temp 98.2°F | Ht 63.0 in | Wt 212.6 lb

## 2020-12-04 DIAGNOSIS — G4733 Obstructive sleep apnea (adult) (pediatric): Secondary | ICD-10-CM | POA: Diagnosis not present

## 2020-12-04 DIAGNOSIS — J4530 Mild persistent asthma, uncomplicated: Secondary | ICD-10-CM

## 2020-12-04 NOTE — Patient Instructions (Signed)
Follow up in 1 year.

## 2020-12-04 NOTE — Progress Notes (Signed)
[facility]  Chief Complaint  Patient presents with   Follow-up    Patient is here for CPAP compliance.    Constitutional:  BP 122/80 (BP Location: Left Arm, Patient Position: Sitting, Cuff Size: Normal)   Pulse 80   Temp 98.2 F (36.8 C) (Oral)   Ht 5' 3"  (1.6 m)   Wt 212 lb 9.6 oz (96.4 kg)   SpO2 98%   BMI 37.66 kg/m   Past Medical History:  COVID 19 infection February 2021 and February 2022, Anemia, Anxiety, DM, Fatty liver, GERD, HLD, TMJ, Obstructive sleep apnea  Past Surgical History:  Her  has a past surgical history that includes Submandibular mass excision (1996); Knee surgery (Bilateral); Bilateral salpingoophorectomy (2009); left heart catheterization with coronary angiogram (N/A, 12/09/2011); Sphenoidectomy (Left, 10/22/2015); Image guided sinus surgery (N/A, 10/22/2015); Hernia repair; and Abdominal hysterectomy (2009).  Brief Summary:  Evelyn James is a 60 y.o. female with asthma, allergic rhinitis, and obstructive sleep apnea.      Subjective:   She had home sleep study in June and then saw [employer].  Showed moderate sleep apnea.  Restarted CPAP.  Working well.  Uses full face mask.  Not having sinus congestion, sore throat, or dry mouth.  Cough is better.  Not having wheeze, or chest congestion.  She got her flu shot last week.   Physical Exam:   Appearance - well kempt   ENMT - no sinus tenderness, no oral exudate, no LAN, Mallampati 2 airway, no stridor, decreased AP diameter  Respiratory - equal breath sounds bilaterally, no wheezing or rales  CV - s1s2 regular rate and rhythm, no murmurs  Ext - no clubbing, no edema  Skin - no rashes  Psych - normal mood and affect    Pulmonary testing:  PFT 12/16/19 >> FEV1 2.57 (102%), FEV1% 91, TLC 4.54 (91%), DLCO 126%  Sleep testing:  HST 08/12/20 >> AHI 15.6, SpO2 low 84% Auto CPAP 11/03/20 to 12/02/20 >> used on 30 of 30 nights with average 8 hrs 24  min.  Average AHI 1.1 with median CPAP 10 and 95 th percentile CPAP 13 cm H2O  Social History:  She  reports that she has never smoked. She has never used smokeless tobacco. She reports that she does not drink alcohol and does not use drugs.  Family History:  Her family history includes Arthritis/Rheumatoid in her paternal grandfather; Breast cancer (age of onset: 34) in her paternal grandmother; Breast cancer (age of onset: 69) in her paternal aunt; Colon polyps in her father; Heart disease in her father; Lung cancer (age of onset: 28) in her mother.     Assessment/Plan:   Mild, persistent asthma. - intolerant of flovent due to throat irritation - continue singulair - prn albuterol  Seasonal allergic rhinitis. - continue singulair - advised her to try using flonase and/or astepro if symptoms get worse during Fall season  Obstructive sleep apnea. - she is compliant with therapy and reports benefit from CPAP - she uses [employer] for her DME - continue auto CPAP 5 to 15 cm H2O  Time Spent Involved in Patient Care on Day of Examination:  23 minutes  Follow up:   Patient Instructions  Follow up in 1 year Medication List:   Allergies as of 12/04/2020       Reactions   Sulfa Antibiotics Other (See Comments)   Topical--red itchy; oral--makes mouth raw Bleeding sores in mouth. Raw mouth and sores on  gums        Medication List        Accurate as of December 04, 2020  2:41 PM. If you have any questions, ask your nurse or doctor.          acetaminophen 500 MG tablet Commonly known as: TYLENOL Take 1,000 mg by mouth every 6 (six) hours as needed for mild pain.   acyclovir ointment 5 % Commonly known as: Zovirax Apply 1 application topically every 3 (three) hours.   albuterol 108 (90 Base) MCG/ACT inhaler Commonly known as: VENTOLIN HFA Inhale 2 puffs into the lungs every 6 (six) hours as needed for wheezing or shortness of breath.   Boric Acid Vaginal 600 MG  Supp Place 1 suppository vaginally daily.   cetirizine 10 MG tablet Commonly known as: ZYRTEC Take 10 mg by mouth daily.   clotrimazole 10 MG troche Commonly known as: MYCELEX Take 1 tablet (10 mg total) by mouth 3 (three) times daily.   cyclobenzaprine 10 MG tablet Commonly known as: FLEXERIL 1/2 tablet q hs prn (do not take without wearing cpap)   dapagliflozin propanediol 10 MG Tabs tablet Commonly known as: Farxiga Take 1 tablet (10 mg total) by mouth every morning.   escitalopram 20 MG tablet Commonly known as: LEXAPRO TAKE 1 TABLET(20 MG) BY MOUTH DAILY   esomeprazole 20 MG capsule Commonly known as: NEXIUM Take 20 mg by mouth daily at 12 noon.   estradiol 1 MG tablet Commonly known as: ESTRACE TAKE 1 TABLET(1 MG) BY MOUTH DAILY   fluticasone 50 MCG/ACT nasal spray Commonly known as: FLONASE Place 2 sprays into both nostrils daily.   glucose blood test strip Use as directed to check blood sugars twice daily. Dx E11.9   Integra 62.5-62.5-40-3 MG Caps Take 1 capsule by mouth 2 (two) times daily.   lisinopril 20 MG tablet Commonly known as: ZESTRIL TAKE 1 TABLET(20 MG) BY MOUTH DAILY   Melatonin 10 MG Tabs Take 10 mg by mouth at bedtime as needed (sleep).   meloxicam 15 MG tablet Commonly known as: MOBIC Take 15 mg by mouth daily.   montelukast 10 MG tablet Commonly known as: SINGULAIR TAKE 1 TABLET(10 MG) BY MOUTH DAILY   mupirocin ointment 2 % Commonly known as: BACTROBAN Place 1 application into the nose 2 (two) times daily.   nystatin cream Commonly known as: MYCOSTATIN Apply 1 application topically 2 (two) times daily.   ONE TOUCH ULTRA 2 w/Device Kit U D TO CHECK BLOOD SUGARS   PHILLIPS COLON HEALTH PO Take 1 tablet by mouth daily as needed (colon health).   rosuvastatin 20 MG tablet Commonly known as: CRESTOR TAKE 1 TABLET(20 MG) BY MOUTH DAILY   Trulicity 3 OF/7.5ZW Sopn Generic drug: Dulaglutide Inject 3 mg as directed once a  week.   valACYclovir 1000 MG tablet Commonly known as: VALTREX Take 1 tablet (1,000 mg total) by mouth 2 (two) times daily.        Signature:  Chesley Mires, MD [place] Pager - [phone] 12/04/2020, 2:41 PM

## 2020-12-05 ENCOUNTER — Encounter: Payer: Self-pay | Admitting: Internal Medicine

## 2020-12-05 DIAGNOSIS — M5430 Sciatica, unspecified side: Secondary | ICD-10-CM | POA: Insufficient documentation

## 2020-12-05 NOTE — Assessment & Plan Note (Signed)
Saw ortho.  S/p prednisone taper.  Discussed PT.  Wants to try water exercises.  Notify me if persistent problems.

## 2020-12-05 NOTE — Assessment & Plan Note (Signed)
Follow cbc.  

## 2020-12-05 NOTE — Assessment & Plan Note (Signed)
No upper symptoms reported.  On nexium.   

## 2020-12-05 NOTE — Assessment & Plan Note (Signed)
Breathing stable.

## 2020-12-05 NOTE — Assessment & Plan Note (Signed)
Continue lisinopril.  Blood pressure doing well.  Follow pressures.  Follow metabolic panel.  

## 2020-12-05 NOTE — Assessment & Plan Note (Signed)
Saw pulmonary.  Recommended cpap.

## 2020-12-05 NOTE — Assessment & Plan Note (Signed)
Continue crestor.  Low cholesterol diet and exercise.  Follow lipid panel and liver function tests.   Lab Results  Component Value Date   CHOL 143 11/24/2020   HDL 61.40 11/24/2020   LDLCALC 57 11/24/2020   LDLDIRECT 93.0 11/07/2018   TRIG 127.0 11/24/2020   CHOLHDL 2 11/24/2020

## 2020-12-05 NOTE — Assessment & Plan Note (Addendum)
Low carb diet and exercise.  AM sugars 110-140.  <140 in the pm.  On trulicity.   Discussed increase to 63m. Follow sugars.  Check met b and a1c.  Wants to work on diet and exercise.  Follow.

## 2020-12-15 MED ORDER — ALBUTEROL SULFATE HFA 108 (90 BASE) MCG/ACT IN AERS: 2.0000 | INHALATION_SPRAY | Freq: Four times a day (QID) | RESPIRATORY_TRACT | 6 refills | Status: DC | PRN

## 2021-01-14 ENCOUNTER — Other Ambulatory Visit: Payer: Self-pay

## 2021-01-14 MED ORDER — MONTELUKAST SODIUM 10 MG PO TABS: 10.0000 mg | ORAL_TABLET | Freq: Every day | ORAL | 5 refills | Status: DC

## 2021-01-14 NOTE — Telephone Encounter (Signed)
Received RX request from [facility], would like refill on Montelukast. Rx Sent into patient preferred pharmacy.

## 2021-01-27 ENCOUNTER — Other Ambulatory Visit: Payer: Self-pay | Admitting: Internal Medicine

## 2021-02-19 ENCOUNTER — Encounter: Payer: Self-pay | Admitting: Internal Medicine

## 2021-02-19 NOTE — Telephone Encounter (Signed)
See mychart message.  Thanks.  °

## 2021-02-22 ENCOUNTER — Telehealth: Payer: Self-pay | Admitting: Pharmacist

## 2021-02-22 ENCOUNTER — Other Ambulatory Visit (HOSPITAL_COMMUNITY): Payer: Self-pay

## 2021-02-22 DIAGNOSIS — E1159 Type 2 diabetes mellitus with other circulatory complications: Secondary | ICD-10-CM

## 2021-02-22 MED ORDER — TRULICITY 3 MG/0.5ML ~~LOC~~ SOAJ
3.0000 mg | SUBCUTANEOUS | 1 refills | Status: DC
  Filled 2021-02-22: qty 2, 28d supply, fill #0

## 2021-02-22 NOTE — Telephone Encounter (Signed)
Patient returned Catie's phone call. °

## 2021-02-22 NOTE — Telephone Encounter (Signed)
Called patient in response to MyChart message regarding Trulicity access. Left message for her to return my call at her convenience.   Called [facility] to see if they have Trulicity 3 mg in stock. They could ship to patient, as appears she lives in [place]. [facility] confirmed they do have Trulicity in stock. Will offer this option to patient when she returns my call. Will also send this information as MyChart

## 2021-02-22 NOTE — Telephone Encounter (Signed)
Called patient back. She is amenable to script being sent to [facility]. Advised that she call them to set up shipping later today.

## 2021-02-23 ENCOUNTER — Other Ambulatory Visit: Payer: Self-pay | Admitting: Internal Medicine

## 2021-03-10 ENCOUNTER — Encounter: Payer: Self-pay | Admitting: Internal Medicine

## 2021-03-10 DIAGNOSIS — R21 Rash and other nonspecific skin eruption: Secondary | ICD-10-CM

## 2021-03-12 ENCOUNTER — Other Ambulatory Visit: Payer: Self-pay | Admitting: Internal Medicine

## 2021-03-13 NOTE — Telephone Encounter (Signed)
I have placed order for additional labs.  Which dermatologist is she seeing?  Need to see if can get an earlier appt with dermatology.

## 2021-03-19 NOTE — Telephone Encounter (Signed)
LM for [facility] to return call

## 2021-03-23 ENCOUNTER — Other Ambulatory Visit: Payer: Self-pay | Admitting: Internal Medicine

## 2021-03-23 DIAGNOSIS — Z1231 Encounter for screening mammogram for malignant neoplasm of breast: Secondary | ICD-10-CM

## 2021-03-23 DIAGNOSIS — E1159 Type 2 diabetes mellitus with other circulatory complications: Secondary | ICD-10-CM

## 2021-03-26 ENCOUNTER — Other Ambulatory Visit: Payer: Self-pay

## 2021-03-26 ENCOUNTER — Other Ambulatory Visit (INDEPENDENT_AMBULATORY_CARE_PROVIDER_SITE_OTHER): Payer: BC Managed Care – PPO

## 2021-03-26 DIAGNOSIS — I10 Essential (primary) hypertension: Secondary | ICD-10-CM

## 2021-03-26 DIAGNOSIS — E1159 Type 2 diabetes mellitus with other circulatory complications: Secondary | ICD-10-CM

## 2021-03-26 DIAGNOSIS — E7800 Pure hypercholesterolemia, unspecified: Secondary | ICD-10-CM

## 2021-03-26 DIAGNOSIS — R21 Rash and other nonspecific skin eruption: Secondary | ICD-10-CM

## 2021-03-26 LAB — HEPATIC FUNCTION PANEL
ALT: 16 U/L (ref 0–35)
AST: 11 U/L (ref 0–37)
Albumin: 4.1 g/dL (ref 3.5–5.2)
Alkaline Phosphatase: 65 U/L (ref 39–117)
Bilirubin, Direct: 0 mg/dL (ref 0.0–0.3)
Total Bilirubin: 0.4 mg/dL (ref 0.2–1.2)
Total Protein: 7 g/dL (ref 6.0–8.3)

## 2021-03-26 LAB — CBC WITH DIFFERENTIAL/PLATELET
Basophils Absolute: 0 10*3/uL (ref 0.0–0.1)
Basophils Relative: 0.5 % (ref 0.0–3.0)
Eosinophils Absolute: 0.1 10*3/uL (ref 0.0–0.7)
Eosinophils Relative: 1.6 % (ref 0.0–5.0)
HCT: 42.6 % (ref 36.0–46.0)
Hemoglobin: 13.5 g/dL (ref 12.0–15.0)
Lymphocytes Relative: 20.6 % (ref 12.0–46.0)
Lymphs Abs: 1.8 10*3/uL (ref 0.7–4.0)
MCHC: 31.8 g/dL (ref 30.0–36.0)
MCV: 90.2 fl (ref 78.0–100.0)
Monocytes Absolute: 0.6 10*3/uL (ref 0.1–1.0)
Monocytes Relative: 6.9 % (ref 3.0–12.0)
Neutro Abs: 6.2 10*3/uL (ref 1.4–7.7)
Neutrophils Relative %: 70.4 % (ref 43.0–77.0)
Platelets: 213 10*3/uL (ref 150.0–400.0)
RBC: 4.72 Mil/uL (ref 3.87–5.11)
RDW: 13.8 % (ref 11.5–15.5)
WBC: 8.8 10*3/uL (ref 4.0–10.5)

## 2021-03-26 LAB — LIPID PANEL
Cholesterol: 275 mg/dL — ABNORMAL HIGH (ref 0–200)
HDL: 56.7 mg/dL (ref >39.00)
NonHDL: 218.77
Total CHOL/HDL Ratio: 5
Triglycerides: 217 mg/dL — ABNORMAL HIGH (ref 0.0–149.0)
VLDL: 43.4 mg/dL — ABNORMAL HIGH (ref 0.0–40.0)

## 2021-03-26 LAB — BASIC METABOLIC PANEL
BUN: 13 mg/dL (ref 6–23)
CO2: 32 mEq/L (ref 19–32)
Calcium: 9.3 mg/dL (ref 8.4–10.5)
Chloride: 99 mEq/L (ref 96–112)
Creatinine, Ser: 0.61 mg/dL (ref 0.40–1.20)
GFR: 96.05 mL/min (ref >60.00)
Glucose, Bld: 139 mg/dL — ABNORMAL HIGH (ref 70–99)
Potassium: 3.7 mEq/L (ref 3.5–5.1)
Sodium: 139 mEq/L (ref 135–145)

## 2021-03-26 LAB — LDL CHOLESTEROL, DIRECT: Direct LDL: 187 mg/dL

## 2021-03-26 LAB — HEMOGLOBIN A1C: Hgb A1c MFr Bld: 7.5 % — ABNORMAL HIGH (ref 4.6–6.5)

## 2021-03-26 LAB — SEDIMENTATION RATE: Sed Rate: 31 mm/hr — ABNORMAL HIGH (ref 0–30)

## 2021-03-29 ENCOUNTER — Encounter: Payer: Self-pay | Admitting: Internal Medicine

## 2021-03-29 ENCOUNTER — Other Ambulatory Visit: Payer: Self-pay

## 2021-03-29 ENCOUNTER — Ambulatory Visit: Payer: BC Managed Care – PPO | Admitting: Internal Medicine

## 2021-03-29 DIAGNOSIS — M25561 Pain in right knee: Secondary | ICD-10-CM

## 2021-03-29 DIAGNOSIS — E1159 Type 2 diabetes mellitus with other circulatory complications: Secondary | ICD-10-CM | POA: Diagnosis not present

## 2021-03-29 DIAGNOSIS — I25118 Atherosclerotic heart disease of native coronary artery with other forms of angina pectoris: Secondary | ICD-10-CM | POA: Diagnosis not present

## 2021-03-29 DIAGNOSIS — K219 Gastro-esophageal reflux disease without esophagitis: Secondary | ICD-10-CM

## 2021-03-29 DIAGNOSIS — I10 Essential (primary) hypertension: Secondary | ICD-10-CM

## 2021-03-29 DIAGNOSIS — J4530 Mild persistent asthma, uncomplicated: Secondary | ICD-10-CM

## 2021-03-29 DIAGNOSIS — E7800 Pure hypercholesterolemia, unspecified: Secondary | ICD-10-CM

## 2021-03-29 DIAGNOSIS — D649 Anemia, unspecified: Secondary | ICD-10-CM

## 2021-03-29 DIAGNOSIS — M25562 Pain in left knee: Secondary | ICD-10-CM

## 2021-03-29 DIAGNOSIS — L299 Pruritus, unspecified: Secondary | ICD-10-CM

## 2021-03-29 NOTE — Patient Instructions (Signed)
Restart metformin as we discussed.    Add pepcid 20mg  - before bed

## 2021-03-29 NOTE — Progress Notes (Signed)
Patient ID: Evelyn James, female   DOB: 10-Jun-1960, 61 y.o.   MRN: [id number]   Subjective:    Patient ID: Evelyn James, female    DOB: 1960/12/19, 61 y.o.   MRN: [id number]  This visit occurred during the SARS-CoV-2 public health emergency.  Safety protocols were in place, including screening questions prior to the visit, additional usage of staff PPE, and extensive cleaning of exam room while observing appropriate contact time as indicated for disinfecting solutions.   Patient here for a scheduled follow up.     HPI Here to follow up regarding her blood pressure, blood sugar and cholesterol.  Was also recently evaluated and diagnosed with urticaria (02/10/22).  Unclear etiology.  Had started lodine and gabapentin approximately two weeks before. Has a hot stone massage two days prior.  Was given prednisone and instructed to take zyrtec and pepcid.  Improved after above regimen, but she states it returned after stopping the prednisone taper.  Not as intense.  She stopped all of her medication except farxiga.  Back on now.  No chest pain or sob reported.  No increased cough or congestion.  Does report persistent GI symptoms.  Taking gas x and nexium bid.  Burping alleviates.  Abdominal discomfort.  Working on keeping bowels regular. No significant itching or rash currently.     Past Medical History:  Diagnosis Date   Anemia    Noted 11/2011   Anxiety    Chest pain    Admitted 11/2011: cath with mild nonobstructive coronary plaque, normal EF, negative cardiac enzymes and negative d-dimer.   Diabetes mellitus    Fatty liver disease, nonalcoholic 0865   GERD (gastroesophageal reflux disease)    Hypercholesterolemia    Hypertension    Reflux    TMJ dysfunction    Past Surgical History:  Procedure Laterality Date   ABDOMINAL HYSTERECTOMY  2009   total   BILATERAL SALPINGOOPHORECTOMY  2009   benign ovarian tumor   HERNIA REPAIR     umbilical   IMAGE GUIDED SINUS SURGERY N/A 10/22/2015    Procedure: IMAGE GUIDED SINUS SURGERY;  Surgeon: Carloyn Manner, MD;  Location: [facility] ORS;  Service: ENT;  Laterality: N/A;   KNEE SURGERY Bilateral    LEFT HEART CATHETERIZATION WITH CORONARY ANGIOGRAM N/A 12/09/2011   Procedure: LEFT HEART CATHETERIZATION WITH CORONARY ANGIOGRAM;  Surgeon: Minus Breeding, MD;  Location: [facility] CATH LAB;  Service: Cardiovascular;  Laterality: N/A;   SPHENOIDECTOMY Left 10/22/2015   Procedure: SPHENOIDECTOMY;  Surgeon: Carloyn Manner, MD;  Location: [facility] ORS;  Service: ENT;  Laterality: Left;   SUBMANDIBULAR MASS EXCISION  1996   [facility]    Family History  Problem Relation Age of Onset   Heart disease Father        Father had CABG in his 81s   Colon polyps Father    Lung cancer Mother 73   Breast cancer Paternal Grandmother 40   Arthritis/Rheumatoid Paternal Grandfather    Breast cancer Paternal Aunt 64   Social History   Socioeconomic History   Marital status: Married    Spouse name: Not on file   Number of children: 2   Years of education: Not on file   Highest education level: Not on file  Occupational History   Not on file  Tobacco Use   Smoking status: Never   Smokeless tobacco: Never  Vaping Use   Vaping Use: Never used  Substance and Sexual Activity   Alcohol use: No    Alcohol/week:  0.0 standard drinks   Drug use: No   Sexual activity: Not on file  Other Topics Concern   Not on file  Social History Narrative   Not on file   Social Determinants of Health   Financial Resource Strain: Not on file  Food Insecurity: Not on file  Transportation Needs: Not on file  Physical Activity: Not on file  Stress: Not on file  Social Connections: Not on file     Review of Systems  Constitutional:  Negative for appetite change and unexpected weight change.  HENT:  Negative for congestion and sinus pressure.   Respiratory:  Negative for cough, chest tightness and shortness of breath.   Cardiovascular:  Negative for chest pain,  palpitations and leg swelling.  Gastrointestinal:  Negative for diarrhea and vomiting.       Abdominal discomfort as outlined.    Genitourinary:  Negative for difficulty urinating and dysuria.  Musculoskeletal:  Negative for myalgias.       Persistent knee pain.  Has seen Dr Posey Pronto and Reche Dixon.  S/p injection right knee.    Skin:        Rash and itching as outlined.    Neurological:  Negative for dizziness, light-headedness and headaches.  Psychiatric/Behavioral:  Negative for agitation and dysphoric mood.       Objective:     BP 120/74    Pulse 88    Ht 5' 2.99" (1.6 m)    Wt 212 lb (96.2 kg)    SpO2 96%    BMI 37.56 kg/m  Wt Readings from Last 3 Encounters:  03/29/21 212 lb (96.2 kg)  12/04/20 212 lb 9.6 oz (96.4 kg)  11/26/20 208 lb (94.3 kg)    Physical Exam Vitals reviewed.  Constitutional:      General: She is not in acute distress.    Appearance: Normal appearance.  HENT:     Head: Normocephalic and atraumatic.     Right Ear: External ear normal.     Left Ear: External ear normal.  Eyes:     General: No scleral icterus.       Right eye: No discharge.        Left eye: No discharge.     Conjunctiva/sclera: Conjunctivae normal.  Neck:     Thyroid: No thyromegaly.  Cardiovascular:     Rate and Rhythm: Normal rate and regular rhythm.  Pulmonary:     Effort: No respiratory distress.     Breath sounds: Normal breath sounds. No wheezing.  Abdominal:     General: Bowel sounds are normal.     Palpations: Abdomen is soft.     Tenderness: There is no abdominal tenderness.  Musculoskeletal:        General: No swelling or tenderness.     Cervical back: Neck supple. No tenderness.  Lymphadenopathy:     Cervical: No cervical adenopathy.  Skin:    Findings: No erythema or rash.  Neurological:     Mental Status: She is alert.  Psychiatric:        Mood and Affect: Mood normal.        Behavior: Behavior normal.     Outpatient Encounter Medications as of 03/29/2021   Medication Sig   acetaminophen (TYLENOL) 500 MG tablet Take 1,000 mg by mouth every 6 (six) hours as needed for mild pain.   acyclovir ointment (ZOVIRAX) 5 % Apply 1 application topically every 3 (three) hours.   albuterol (VENTOLIN HFA) 108 (90 Base) MCG/ACT inhaler Inhale  2 puffs into the lungs every 6 (six) hours as needed for wheezing or shortness of breath.   Blood Glucose Monitoring Suppl (ONE TOUCH ULTRA 2) w/Device KIT U D TO CHECK BLOOD SUGARS   Boric Acid Vaginal 600 MG SUPP Place 1 suppository vaginally daily.   cetirizine (ZYRTEC) 10 MG tablet Take 10 mg by mouth daily.   clotrimazole (MYCELEX) 10 MG troche Take 1 tablet (10 mg total) by mouth 3 (three) times daily.   cyclobenzaprine (FLEXERIL) 10 MG tablet 1/2 tablet q hs prn (do not take without wearing cpap)   escitalopram (LEXAPRO) 20 MG tablet TAKE 1 TABLET(20 MG) BY MOUTH DAILY   esomeprazole (NEXIUM) 20 MG capsule Take 20 mg by mouth daily at 12 noon.   etodolac (LODINE) 500 MG tablet Take 500 mg by mouth 2 (two) times daily.   FARXIGA 10 MG TABS tablet TAKE 1 TABLET(10 MG) BY MOUTH EVERY MORNING   Fe Fum-FePoly-Vit C-Vit B3 (INTEGRA) 62.5-62.5-40-3 MG CAPS Take 1 capsule by mouth 2 (two) times daily.   fluticasone (FLONASE) 50 MCG/ACT nasal spray Place 2 sprays into both nostrils daily.   gabapentin (NEURONTIN) 100 MG capsule Take 100 mg by mouth daily.   glucose blood test strip Use as directed to check blood sugars twice daily. Dx E11.9   lisinopril (ZESTRIL) 20 MG tablet TAKE 1 TABLET(20 MG) BY MOUTH DAILY   Melatonin 10 MG TABS Take 10 mg by mouth at bedtime as needed (sleep).   meloxicam (MOBIC) 15 MG tablet Take 15 mg by mouth daily.   montelukast (SINGULAIR) 10 MG tablet Take 1 tablet (10 mg total) by mouth at bedtime.   mupirocin ointment (BACTROBAN) 2 % Place 1 application into the nose 2 (two) times daily.   nystatin cream (MYCOSTATIN) Apply 1 application topically 2 (two) times daily.   Probiotic Product  (PHILLIPS COLON HEALTH PO) Take 1 tablet by mouth daily as needed (colon health).    rosuvastatin (CRESTOR) 20 MG tablet TAKE 1 TABLET(20 MG) BY MOUTH DAILY   TRULICITY 3 AS/3.4HD SOPN ADMINISTER 0.5 ML(3 MG) UNDER THE SKIN 1 TIME A WEEK AS DIRECTED   valACYclovir (VALTREX) 1000 MG tablet Take 1 tablet (1,000 mg total) by mouth 2 (two) times daily.   [DISCONTINUED] estradiol (ESTRACE) 1 MG tablet TAKE 1 TABLET(1 MG) BY MOUTH DAILY   No facility-administered encounter medications on file as of 03/29/2021.     Lab Results  Component Value Date   WBC 8.8 03/26/2021   HGB 13.5 03/26/2021   HCT 42.6 03/26/2021   PLT 213.0 03/26/2021   GLUCOSE 139 (H) 03/26/2021   CHOL 275 (H) 03/26/2021   TRIG 217.0 (H) 03/26/2021   HDL 56.70 03/26/2021   LDLDIRECT 187.0 03/26/2021   LDLCALC 57 11/24/2020   ALT 16 03/26/2021   AST 11 03/26/2021   NA 139 03/26/2021   K 3.7 03/26/2021   CL 99 03/26/2021   CREATININE 0.61 03/26/2021   BUN 13 03/26/2021   CO2 32 03/26/2021   TSH 2.72 08/20/2020   HGBA1C 7.5 (H) 03/26/2021   MICROALBUR 1.2 08/20/2020       Assessment & Plan:   Problem List Items Addressed This Visit     Anemia    Follow cbc.  EGD 05/20/13 - mild chronic gastritis.  No repeat EGD recommended at that time.  Colonoscopy 05/20/13 - diverticulosis.   f/u colonoscopy in 10 years.        Asthma    Breathing stable.  CAD (coronary artery disease)    Continue risk factor modification.  Crestor.       Diabetes mellitus with circulatory complication (HCC)    V8X increased - 7.5.  Has been on prednisone, etc.  Discussed low carb diet and exercise.  farxiga and trulicity.  Add metformin as discussed. Check and record sugars bid.  Send in readings.        GERD (gastroesophageal reflux disease)    Symptoms as outlined.  On nexium bid and gasx.  Obtain abdominal ultrasound.  Continue nexium bid.  Add pepcid q hs. Consider GI referral.       Relevant Orders   US Abdomen Complete    Hypercholesterolemia    Crestor.  Low cholesterol diet and exercise.  Follow lipid panel and liver function tests.   Lab Results  Component Value Date   CHOL 275 (H) 03/26/2021   HDL 56.70 03/26/2021   LDLCALC 57 11/24/2020   LDLDIRECT 187.0 03/26/2021   TRIG 217.0 (H) 03/26/2021   CHOLHDL 5 03/26/2021        Hypertension    Has been on lisinopril. Blood pressure as outlined. Follow pressures.  Follow metabolic panel.       Itching    Itching/rash as outlined.  Seen at [facility].  Diagnosed - urticaria.  Treated with prednisone, antihistamine and pepcid.  Improved, but recurred.  No significant rash/itching currently.  Unclear etiology.  Given severity and persistent intermittent issue, will refer to allergy for further testing and evaluation.        Relevant Orders   Ambulatory referral to Allergy   Knee pain    Has seen ortho.  S/p injection.  Has f/u 04/07/21.          Einar Pheasant, MD

## 2021-03-30 ENCOUNTER — Encounter: Payer: Self-pay | Admitting: Internal Medicine

## 2021-04-04 DIAGNOSIS — L501 Idiopathic urticaria: Secondary | ICD-10-CM | POA: Insufficient documentation

## 2021-04-04 DIAGNOSIS — L299 Pruritus, unspecified: Secondary | ICD-10-CM | POA: Insufficient documentation

## 2021-04-04 NOTE — Assessment & Plan Note (Signed)
Crestor.  Low cholesterol diet and exercise.  Follow lipid panel and liver function tests.   Lab Results  Component Value Date   CHOL 275 (H) 03/26/2021   HDL 56.70 03/26/2021   LDLCALC 57 11/24/2020   LDLDIRECT 187.0 03/26/2021   TRIG 217.0 (H) 03/26/2021   CHOLHDL 5 03/26/2021

## 2021-04-04 NOTE — Assessment & Plan Note (Signed)
Breathing stable.

## 2021-04-04 NOTE — Assessment & Plan Note (Signed)
Itching/rash as outlined.  Seen at [facility].  Diagnosed - urticaria.  Treated with prednisone, antihistamine and pepcid.  Improved, but recurred.  No significant rash/itching currently.  Unclear etiology.  Given severity and persistent intermittent issue, will refer to allergy for further testing and evaluation.

## 2021-04-04 NOTE — Assessment & Plan Note (Addendum)
a1c increased - 7.5.  Has been on prednisone, etc.  Discussed low carb diet and exercise.  farxiga and trulicity.  Add metformin as discussed. Check and record sugars bid.  Send in readings.

## 2021-04-04 NOTE — Assessment & Plan Note (Addendum)
Symptoms as outlined.  On nexium bid and gasx.  Obtain abdominal ultrasound.  Continue nexium bid.  Add pepcid q hs. Consider GI referral.

## 2021-04-04 NOTE — Assessment & Plan Note (Addendum)
Follow cbc.  EGD 05/20/13 - mild chronic gastritis.  No repeat EGD recommended at that time.  Colonoscopy 05/20/13 - diverticulosis.   f/u colonoscopy in 10 years.   

## 2021-04-04 NOTE — Assessment & Plan Note (Signed)
Has seen ortho.  S/p injection.  Has f/u 04/07/21.

## 2021-04-04 NOTE — Assessment & Plan Note (Signed)
Has been on lisinopril. Blood pressure as outlined. Follow pressures.  Follow metabolic panel.

## 2021-04-04 NOTE — Assessment & Plan Note (Addendum)
Continue risk factor modification.  Crestor.  

## 2021-04-08 ENCOUNTER — Other Ambulatory Visit: Payer: Self-pay

## 2021-04-08 ENCOUNTER — Ambulatory Visit
Admission: RE | Admit: 2021-04-08 | Discharge: 2021-04-08 | Disposition: A | Discharge status: Home / Self Care | Payer: BC Managed Care – PPO | Source: Ambulatory Visit | Attending: Internal Medicine | Admitting: Internal Medicine

## 2021-04-08 DIAGNOSIS — K219 Gastro-esophageal reflux disease without esophagitis: Secondary | ICD-10-CM | POA: Diagnosis present

## 2021-04-09 ENCOUNTER — Telehealth: Payer: Self-pay | Admitting: *Deleted

## 2021-04-09 ENCOUNTER — Encounter: Payer: Self-pay | Admitting: Internal Medicine

## 2021-04-09 DIAGNOSIS — K760 Fatty (change of) liver, not elsewhere classified: Secondary | ICD-10-CM

## 2021-04-09 DIAGNOSIS — K219 Gastro-esophageal reflux disease without esophagitis: Secondary | ICD-10-CM

## 2021-04-09 NOTE — Telephone Encounter (Signed)
-----   Message from Dale Havana, MD sent at 04/09/2021  6:02 AM EST ----- Notify - abdominal ultrasound reveals changes consistent with fatty liver.  No other acute abnormality.  Given persistent problems with acid reflux, etc - I would like to refer to GI for further evaluation.  Previously saw Dr Marva Panda [facility]).  He has retired.  Confirm if wants to stay at [facility] - ok for referral.

## 2021-04-09 NOTE — Telephone Encounter (Signed)
Order placed for referral to GI [facility])

## 2021-04-09 NOTE — Telephone Encounter (Signed)
Left message to call office

## 2021-04-09 NOTE — Telephone Encounter (Signed)
Patient was advised of abdominal ultrasound revealing changes consistent with fatty liver. No other acute abnormalities. Given persistent problems with acid reflux, etc. Pcp would like to refer patient to GI [facility]) and that the provider she seen recently has retired. Patient gave verbal understanding and stated she would like to stay with [facility]. Patient was advised this message would be sent in to PCP and someone will be in contact with her in regards to referral. Patient gave verbal understanding and stated she has no questions at this time currently.

## 2021-04-12 ENCOUNTER — Other Ambulatory Visit: Payer: Self-pay | Admitting: Internal Medicine

## 2021-04-16 ENCOUNTER — Encounter: Payer: Self-pay | Admitting: Internal Medicine

## 2021-04-16 ENCOUNTER — Other Ambulatory Visit: Payer: Self-pay | Admitting: Internal Medicine

## 2021-04-19 ENCOUNTER — Other Ambulatory Visit (HOSPITAL_COMMUNITY): Payer: Self-pay

## 2021-04-19 ENCOUNTER — Other Ambulatory Visit: Payer: Self-pay

## 2021-04-19 ENCOUNTER — Other Ambulatory Visit: Payer: Self-pay | Admitting: Internal Medicine

## 2021-04-19 DIAGNOSIS — E1159 Type 2 diabetes mellitus with other circulatory complications: Secondary | ICD-10-CM

## 2021-04-19 MED ORDER — TRULICITY 3 MG/0.5ML ~~LOC~~ SOAJ
SUBCUTANEOUS | 1 refills | Status: DC
  Filled 2021-04-19: qty 2, 28d supply, fill #0
  Filled 2021-05-14: qty 2, 28d supply, fill #1
  Filled 2021-06-11: qty 2, 28d supply, fill #2
  Filled 2021-07-07: qty 2, 28d supply, fill #3
  Filled 2021-08-05: qty 2, 28d supply, fill #4
  Filled 2021-08-31: qty 2, 28d supply, fill #5

## 2021-04-19 NOTE — Telephone Encounter (Signed)
[facility] is out of trulicity. Pt is going to check [facility] in [place] and let me know if I need to send there. Will be sending my chart message.

## 2021-04-19 NOTE — Telephone Encounter (Signed)
LM to follow up with patient.

## 2021-04-19 NOTE — Telephone Encounter (Signed)
LMTCB. Need to know if [facility] needs a new rx or if I need to send to another pharmacy.

## 2021-04-19 NOTE — Telephone Encounter (Signed)
Pt returning call

## 2021-04-22 NOTE — Telephone Encounter (Signed)
Pt returning call. Pt requesting callback.  °

## 2021-04-27 ENCOUNTER — Other Ambulatory Visit: Payer: Self-pay

## 2021-04-27 ENCOUNTER — Ambulatory Visit
Admission: RE | Admit: 2021-04-27 | Discharge: 2021-04-27 | Disposition: A | Discharge status: Home / Self Care | Payer: BC Managed Care – PPO | Source: Ambulatory Visit | Attending: Internal Medicine | Admitting: Internal Medicine

## 2021-04-27 DIAGNOSIS — Z1231 Encounter for screening mammogram for malignant neoplasm of breast: Secondary | ICD-10-CM | POA: Diagnosis present

## 2021-05-14 ENCOUNTER — Other Ambulatory Visit (HOSPITAL_COMMUNITY): Payer: Self-pay

## 2021-05-18 ENCOUNTER — Ambulatory Visit: Payer: BC Managed Care – PPO | Admitting: Internal Medicine

## 2021-05-18 ENCOUNTER — Other Ambulatory Visit: Payer: Self-pay

## 2021-05-18 VITALS — BP 120/70 | HR 87 | Temp 97.9°F | Resp 16 | Ht 63.0 in | Wt 203.4 lb

## 2021-05-18 DIAGNOSIS — M25562 Pain in left knee: Secondary | ICD-10-CM

## 2021-05-18 DIAGNOSIS — E1159 Type 2 diabetes mellitus with other circulatory complications: Secondary | ICD-10-CM | POA: Diagnosis not present

## 2021-05-18 DIAGNOSIS — R8290 Unspecified abnormal findings in urine: Secondary | ICD-10-CM | POA: Diagnosis not present

## 2021-05-18 DIAGNOSIS — I10 Essential (primary) hypertension: Secondary | ICD-10-CM | POA: Diagnosis not present

## 2021-05-18 DIAGNOSIS — E7800 Pure hypercholesterolemia, unspecified: Secondary | ICD-10-CM

## 2021-05-18 DIAGNOSIS — M25561 Pain in right knee: Secondary | ICD-10-CM

## 2021-05-18 DIAGNOSIS — D649 Anemia, unspecified: Secondary | ICD-10-CM

## 2021-05-18 DIAGNOSIS — J4530 Mild persistent asthma, uncomplicated: Secondary | ICD-10-CM

## 2021-05-18 DIAGNOSIS — I25118 Atherosclerotic heart disease of native coronary artery with other forms of angina pectoris: Secondary | ICD-10-CM

## 2021-05-18 DIAGNOSIS — K219 Gastro-esophageal reflux disease without esophagitis: Secondary | ICD-10-CM

## 2021-05-18 DIAGNOSIS — L299 Pruritus, unspecified: Secondary | ICD-10-CM

## 2021-05-18 LAB — URINALYSIS, ROUTINE W REFLEX MICROSCOPIC
Bilirubin Urine: NEGATIVE
Hgb urine dipstick: NEGATIVE
Ketones, ur: NEGATIVE
Leukocytes,Ua: NEGATIVE
Nitrite: NEGATIVE
Specific Gravity, Urine: 1.01 (ref 1.000–1.030)
Total Protein, Urine: NEGATIVE
Urine Glucose: >=1000 — AB
Urobilinogen, UA: 0.2 (ref 0.0–1.0)
pH: 6.5 (ref 5.0–8.0)

## 2021-05-18 MED ORDER — METFORMIN HCL 500 MG PO TABS: 500.0000 mg | ORAL_TABLET | Freq: Two times a day (BID) | ORAL | 1 refills | Status: DC

## 2021-05-18 NOTE — Progress Notes (Addendum)
Patient ID: Evelyn James, female   DOB: [birth year], 61 y.o.   MRN: [id number]   Subjective:    Patient ID: Evelyn James, female    DOB: 06/02/1960, 61 y.o.   MRN: [id number]  This visit occurred during the SARS-CoV-2 public health emergency.  Safety protocols were in place, including screening questions prior to the visit, additional usage of staff PPE, and extensive cleaning of exam room while observing appropriate contact time as indicated for disinfecting solutions.   Patient here for a scheduled follow up.   Chief Complaint  Patient presents with   Urticaria   GI issues   .   Urticaria Pertinent negatives include no congestion, cough, diarrhea, shortness of breath or vomiting.  Feeling better. Did see an allergist.  Testing unrevealing of a definite source.  Was placed on pepcid and xyzal.  Rash has resolved.  No hives.  No chest pain or sob reported.  No abdominal pain or bowel change reported.  Does report noticing a bad odor to her urine.  No dysuria.  No frequency.  Occasional stabbing sensation - vagina.  No bleeding.  No itching.  No discharge.  Taking and tolerating metformin.  Seeing ortho - s/p recent synvisc injection.  Occasionally will take etodolac - but does not take often.  (Takes prn for right knee pain).  Taking nexium.  No acid reflux.  Reports sugars are doing better.  Last pm 90.  120s in am. Plans to start back exercising - pool exercise.     Past Medical History:  Diagnosis Date   Anemia    Noted 11/2011   Anxiety    Chest pain    Admitted 11/2011: cath with mild nonobstructive coronary plaque, normal EF, negative cardiac enzymes and negative d-dimer.   Diabetes mellitus    Fatty liver disease, nonalcoholic 0923   GERD (gastroesophageal reflux disease)    Hypercholesterolemia    Hypertension    Reflux    TMJ dysfunction    Past Surgical History:  Procedure Laterality Date   ABDOMINAL HYSTERECTOMY  2009   total   BILATERAL SALPINGOOPHORECTOMY  2009    benign ovarian tumor   HERNIA REPAIR     umbilical   IMAGE GUIDED SINUS SURGERY N/A 10/22/2015   Procedure: IMAGE GUIDED SINUS SURGERY;  Surgeon: Carloyn Manner, MD;  Location: [facility] ORS;  Service: ENT;  Laterality: N/A;   KNEE SURGERY Bilateral    LEFT HEART CATHETERIZATION WITH CORONARY ANGIOGRAM N/A 12/09/2011   Procedure: LEFT HEART CATHETERIZATION WITH CORONARY ANGIOGRAM;  Surgeon: Minus Breeding, MD;  Location: [facility] CATH LAB;  Service: Cardiovascular;  Laterality: N/A;   SPHENOIDECTOMY Left 10/22/2015   Procedure: SPHENOIDECTOMY;  Surgeon: Carloyn Manner, MD;  Location: [facility] ORS;  Service: ENT;  Laterality: Left;   SUBMANDIBULAR MASS EXCISION  1996   [facility]    Family History  Problem Relation Age of Onset   Heart disease Father        Father had CABG in his 25s   Colon polyps Father    Lung cancer Mother 38   Breast cancer Paternal Grandmother 74   Arthritis/Rheumatoid Paternal Grandfather    Breast cancer Paternal Aunt 34   Social History   Socioeconomic History   Marital status: Married    Spouse name: Not on file   Number of children: 2   Years of education: Not on file   Highest education level: Not on file  Occupational History   Not on file  Tobacco Use  Smoking status: Never   Smokeless tobacco: Never  Vaping Use   Vaping Use: Never used  Substance and Sexual Activity   Alcohol use: No    Alcohol/week: 0.0 standard drinks   Drug use: No   Sexual activity: Not on file  Other Topics Concern   Not on file  Social History Narrative   Not on file   Social Determinants of Health   Financial Resource Strain: Not on file  Food Insecurity: Not on file  Transportation Needs: Not on file  Physical Activity: Not on file  Stress: Not on file  Social Connections: Not on file     Review of Systems  Constitutional:  Negative for appetite change and unexpected weight change.  HENT:  Negative for congestion and sinus pressure.   Respiratory:  Negative for cough,  chest tightness and shortness of breath.   Cardiovascular:  Negative for chest pain, palpitations and leg swelling.  Gastrointestinal:  Negative for abdominal pain, diarrhea, nausea and vomiting.  Genitourinary:  Negative for difficulty urinating and dysuria.       Bad odor to urine.    Musculoskeletal:  Negative for joint swelling and myalgias.  Skin:  Negative for color change and rash.  Neurological:  Negative for dizziness, light-headedness and headaches.  Psychiatric/Behavioral:  Negative for agitation and dysphoric mood.       Objective:     BP 120/70    Pulse 87    Temp 97.9 F (36.6 C)    Resp 16    Ht 5' 3"  (1.6 m)    Wt 203 lb 6.4 oz (92.3 kg)    SpO2 98%    BMI 36.03 kg/m  Wt Readings from Last 3 Encounters:  05/18/21 203 lb 6.4 oz (92.3 kg)  03/29/21 212 lb (96.2 kg)  12/04/20 212 lb 9.6 oz (96.4 kg)    Physical Exam Vitals reviewed.  Constitutional:      General: She is not in acute distress.    Appearance: Normal appearance.  HENT:     Head: Normocephalic and atraumatic.     Right Ear: External ear normal.     Left Ear: External ear normal.  Eyes:     General: No scleral icterus.       Right eye: No discharge.        Left eye: No discharge.     Conjunctiva/sclera: Conjunctivae normal.  Neck:     Thyroid: No thyromegaly.  Cardiovascular:     Rate and Rhythm: Normal rate and regular rhythm.  Pulmonary:     Effort: No respiratory distress.     Breath sounds: Normal breath sounds. No wheezing.  Abdominal:     General: Bowel sounds are normal.     Palpations: Abdomen is soft.     Tenderness: There is no abdominal tenderness.  Musculoskeletal:        General: No swelling or tenderness.     Cervical back: Neck supple. No tenderness.  Lymphadenopathy:     Cervical: No cervical adenopathy.  Skin:    Findings: No erythema or rash.  Neurological:     Mental Status: She is alert.  Psychiatric:        Mood and Affect: Mood normal.        Behavior: Behavior  normal.     Outpatient Encounter Medications as of 05/18/2021  Medication Sig   metFORMIN (GLUCOPHAGE) 500 MG tablet Take 1 tablet (500 mg total) by mouth 2 (two) times daily with a meal.  acetaminophen (TYLENOL) 500 MG tablet Take 1,000 mg by mouth every 6 (six) hours as needed for mild pain.   acyclovir ointment (ZOVIRAX) 5 % Apply 1 application topically every 3 (three) hours.   albuterol (VENTOLIN HFA) 108 (90 Base) MCG/ACT inhaler Inhale 2 puffs into the lungs every 6 (six) hours as needed for wheezing or shortness of breath.   Blood Glucose Monitoring Suppl (ONE TOUCH ULTRA 2) w/Device KIT U D TO CHECK BLOOD SUGARS   Boric Acid Vaginal 600 MG SUPP Place 1 suppository vaginally daily.   cetirizine (ZYRTEC) 10 MG tablet Take 10 mg by mouth daily.   clotrimazole (MYCELEX) 10 MG troche Take 1 tablet (10 mg total) by mouth 3 (three) times daily.   cyclobenzaprine (FLEXERIL) 10 MG tablet 1/2 tablet q hs prn (do not take without wearing cpap)   Dulaglutide (TRULICITY) 3 SW/1.0XN SOPN Inject 0.42m (3 MG) under the skin once weekly as directed   escitalopram (LEXAPRO) 20 MG tablet TAKE 1 TABLET(20 MG) BY MOUTH DAILY   esomeprazole (NEXIUM) 20 MG capsule Take 20 mg by mouth daily at 12 noon.   etodolac (LODINE) 500 MG tablet Take 500 mg by mouth 2 (two) times daily.   famotidine (PEPCID) 40 MG tablet Take 40 mg by mouth 2 (two) times daily.   FARXIGA 10 MG TABS tablet TAKE 1 TABLET(10 MG) BY MOUTH EVERY MORNING   Fe Fum-FePoly-Vit C-Vit B3 (INTEGRA) 62.5-62.5-40-3 MG CAPS TAKE 1 CAPSULE BY MOUTH TWICE DAILY   fluticasone (FLONASE) 50 MCG/ACT nasal spray Place 2 sprays into both nostrils daily.   gabapentin (NEURONTIN) 100 MG capsule Take 100 mg by mouth daily.   levocetirizine (XYZAL) 5 MG tablet SMARTSIG:1 Tablet(s) By Mouth Every Evening   lisinopril (ZESTRIL) 20 MG tablet TAKE 1 TABLET(20 MG) BY MOUTH DAILY   Melatonin 10 MG TABS Take 10 mg by mouth at bedtime as needed (sleep).   meloxicam  (MOBIC) 15 MG tablet Take 15 mg by mouth daily.   mometasone (ELOCON) 0.1 % cream Apply topically 2 (two) times daily.   montelukast (SINGULAIR) 10 MG tablet Take 1 tablet (10 mg total) by mouth at bedtime.   mupirocin ointment (BACTROBAN) 2 % Place 1 application into the nose 2 (two) times daily.   nystatin cream (MYCOSTATIN) Apply 1 application topically 2 (two) times daily.   ONETOUCH ULTRA test strip USE AS DIRECTED TO CHECK BLOOD SUGAR TWICE DAILY   Probiotic Product (PHILLIPS COLON HEALTH PO) Take 1 tablet by mouth daily as needed (colon health).    rosuvastatin (CRESTOR) 20 MG tablet TAKE 1 TABLET(20 MG) BY MOUTH DAILY   valACYclovir (VALTREX) 1000 MG tablet Take 1 tablet (1,000 mg total) by mouth 2 (two) times daily.   No facility-administered encounter medications on file as of 05/18/2021.     Lab Results  Component Value Date   WBC 8.8 03/26/2021   HGB 13.5 03/26/2021   HCT 42.6 03/26/2021   PLT 213.0 03/26/2021   GLUCOSE 139 (H) 03/26/2021   CHOL 275 (H) 03/26/2021   TRIG 217.0 (H) 03/26/2021   HDL 56.70 03/26/2021   LDLDIRECT 187.0 03/26/2021   LDLCALC 57 11/24/2020   ALT 16 03/26/2021   AST 11 03/26/2021   NA 139 03/26/2021   K 3.7 03/26/2021   CL 99 03/26/2021   CREATININE 0.61 03/26/2021   BUN 13 03/26/2021   CO2 32 03/26/2021   TSH 2.72 08/20/2020   HGBA1C 7.5 (H) 03/26/2021   MICROALBUR 1.2 08/20/2020  MM 3D SCREEN BREAST BILATERAL  Result Date: 04/27/2021 CLINICAL DATA:  Screening. EXAM: DIGITAL SCREENING BILATERAL MAMMOGRAM WITH TOMOSYNTHESIS AND CAD TECHNIQUE: Bilateral screening digital craniocaudal and mediolateral oblique mammograms were obtained. Bilateral screening digital breast tomosynthesis was performed. The images were evaluated with computer-aided detection. COMPARISON:  Previous exam(s). ACR Breast Density Category a: The breast tissue is almost entirely fatty. FINDINGS: There are no findings suspicious for malignancy. IMPRESSION: No  mammographic evidence of malignancy. A result letter of this screening mammogram will be mailed directly to the patient. RECOMMENDATION: Screening mammogram in one year. (Code:SM-B-01Y) BI-RADS CATEGORY  1: Negative. Electronically Signed   By: Marin Olp M.D.   On: 04/27/2021 10:37      Assessment & Plan:   Problem List Items Addressed This Visit     Anemia    Follow cbc.  EGD 05/20/13 - mild chronic gastritis.  No repeat EGD recommended at that time.  Colonoscopy 05/20/13 - diverticulosis.   f/u colonoscopy in 10 years.        Asthma    Breathing stable.        Bad odor of urine    Recently noticed odor - urine.  Will check urine to confirm no infection.  Also has noticed occasional "stabbin" sensation in her vagina.  Offered pelvic.  She report rarely occurs.  No vaginal discharge or itching. Wants to monitor.  Will notify me if persistent.       Relevant Orders   Urinalysis, Routine w reflex microscopic (Completed)   Urine Culture   CAD (coronary artery disease)    Continue risk factor modification.  Crestor.       Diabetes mellitus with circulatory complication (HCC) - Primary    Last a1c elevated 7.5.  She is currently on trulicity,farxiga and taking metformin 529m bid.  Sugars per her report have improved.  Planning to exercise more (pool exercise).  Low carb diet and exercise.  Follow met b and a1c.       Relevant Medications   metFORMIN (GLUCOPHAGE) 500 MG tablet   Other Relevant Orders   Hemoglobin A1c   GERD (gastroesophageal reflux disease)    On nexium.  Denies abdominal pain.  No bowel change.  Feels better.  Continue nexium.  Try to avoid antiinflammatories.       Relevant Medications   famotidine (PEPCID) 40 MG tablet   Hypercholesterolemia    Crestor.  Low cholesterol diet and exercise.  Follow lipid panel and liver function tests.   Lab Results  Component Value Date   CHOL 275 (H) 03/26/2021   HDL 56.70 03/26/2021   LDLCALC 57 11/24/2020   LDLDIRECT  187.0 03/26/2021   TRIG 217.0 (H) 03/26/2021   CHOLHDL 5 03/26/2021        Relevant Orders   Lipid panel   Hepatic function panel   Basic metabolic panel   Hypertension    On lisinopril. Blood pressure as outlined. Follow pressures.  Follow metabolic panel.       Itching    Previous itching and rash as outlined.  Saw an allergist.  On xyzal and pepcid.  Symptoms have resolved.  Follow.        Knee pain    Seeing ortho.  S/p synvisc injection.  Knees are doing better.  Still some intermittent discomfort - right knee.  Will occasionally take etodolac.  Discussed possible side effects.  Uses sparingly.  Follow.          CEinar Pheasant MD

## 2021-05-19 ENCOUNTER — Other Ambulatory Visit: Payer: BC Managed Care – PPO

## 2021-05-19 ENCOUNTER — Encounter: Payer: Self-pay | Admitting: Internal Medicine

## 2021-05-19 DIAGNOSIS — R8290 Unspecified abnormal findings in urine: Secondary | ICD-10-CM

## 2021-05-19 NOTE — Assessment & Plan Note (Signed)
Recently noticed odor - urine.  Will check urine to confirm no infection.  Also has noticed occasional "stabbin" sensation in her vagina.  Offered pelvic.  She report rarely occurs.  No vaginal discharge or itching. Wants to monitor.  Will notify me if persistent.  ?

## 2021-05-19 NOTE — Assessment & Plan Note (Signed)
On nexium.  Denies abdominal pain.  No bowel change.  Feels better.  Continue nexium.  Try to avoid antiinflammatories.  ?

## 2021-05-19 NOTE — Assessment & Plan Note (Signed)
On lisinopril.  Blood pressure as outlined.  Follow pressures.  Follow metabolic panel.  

## 2021-05-19 NOTE — Assessment & Plan Note (Signed)
Previous itching and rash as outlined.  Saw an allergist.  On xyzal and pepcid.  Symptoms have resolved.  Follow.   ?

## 2021-05-19 NOTE — Assessment & Plan Note (Signed)
Last a1c elevated 7.5.  She is currently on trulicity,farxiga and taking metformin 542m bid.  Sugars per her report have improved.  Planning to exercise more (pool exercise).  Low carb diet and exercise.  Follow met b and a1c.  ?

## 2021-05-19 NOTE — Assessment & Plan Note (Signed)
Seeing ortho.  S/p synvisc injection.  Knees are doing better.  Still some intermittent discomfort - right knee.  Will occasionally take etodolac.  Discussed possible side effects.  Uses sparingly.  Follow.   ?

## 2021-05-19 NOTE — Assessment & Plan Note (Signed)
Follow cbc.  EGD 05/20/13 - mild chronic gastritis.  No repeat EGD recommended at that time.  Colonoscopy 05/20/13 - diverticulosis.   f/u colonoscopy in 10 years.   

## 2021-05-19 NOTE — Assessment & Plan Note (Signed)
Crestor.  Low cholesterol diet and exercise.  Follow lipid panel and liver function tests.   ?Lab Results  ?Component Value Date  ? CHOL 275 (H) 03/26/2021  ? HDL 56.70 03/26/2021  ? LDLCALC 57 11/24/2020  ? LDLDIRECT 187.0 03/26/2021  ? TRIG 217.0 (H) 03/26/2021  ? CHOLHDL 5 03/26/2021  ? ?

## 2021-05-19 NOTE — Assessment & Plan Note (Signed)
Breathing stable.

## 2021-05-19 NOTE — Assessment & Plan Note (Signed)
Continue risk factor modification.  Crestor.  

## 2021-05-19 NOTE — Addendum Note (Signed)
Addended by: Charm Barges on: 05/19/2021 03:39 AM ? ? Modules accepted: Orders ? ?

## 2021-05-21 LAB — URINE CULTURE
MICRO NUMBER:: 13104014
SPECIMEN QUALITY:: ADEQUATE

## 2021-06-08 ENCOUNTER — Other Ambulatory Visit: Payer: Self-pay | Admitting: Internal Medicine

## 2021-06-11 ENCOUNTER — Other Ambulatory Visit (HOSPITAL_COMMUNITY): Payer: Self-pay

## 2021-06-11 ENCOUNTER — Encounter: Payer: Self-pay | Admitting: Internal Medicine

## 2021-06-14 ENCOUNTER — Other Ambulatory Visit: Payer: Self-pay

## 2021-06-14 ENCOUNTER — Other Ambulatory Visit (HOSPITAL_COMMUNITY): Payer: Self-pay

## 2021-06-14 MED ORDER — INTEGRA 62.5-62.5-40-3 MG PO CAPS: 1.0000 | ORAL_CAPSULE | Freq: Two times a day (BID) | ORAL | 1 refills | Status: DC

## 2021-06-29 LAB — HM DIABETES EYE EXAM

## 2021-07-07 ENCOUNTER — Other Ambulatory Visit (HOSPITAL_COMMUNITY): Payer: Self-pay

## 2021-07-08 ENCOUNTER — Other Ambulatory Visit: Payer: Self-pay

## 2021-07-08 MED ORDER — MONTELUKAST SODIUM 10 MG PO TABS: 10.0000 mg | ORAL_TABLET | Freq: Every day | ORAL | 4 refills | Status: AC

## 2021-07-14 ENCOUNTER — Other Ambulatory Visit (INDEPENDENT_AMBULATORY_CARE_PROVIDER_SITE_OTHER): Payer: BC Managed Care – PPO

## 2021-07-14 DIAGNOSIS — E7800 Pure hypercholesterolemia, unspecified: Secondary | ICD-10-CM | POA: Diagnosis not present

## 2021-07-14 DIAGNOSIS — E1159 Type 2 diabetes mellitus with other circulatory complications: Secondary | ICD-10-CM | POA: Diagnosis not present

## 2021-07-14 LAB — HEPATIC FUNCTION PANEL
ALT: 17 U/L (ref 0–35)
AST: 13 U/L (ref 0–37)
Albumin: 4.2 g/dL (ref 3.5–5.2)
Alkaline Phosphatase: 62 U/L (ref 39–117)
Bilirubin, Direct: 0.1 mg/dL (ref 0.0–0.3)
Total Bilirubin: 0.4 mg/dL (ref 0.2–1.2)
Total Protein: 6.6 g/dL (ref 6.0–8.3)

## 2021-07-14 LAB — BASIC METABOLIC PANEL
BUN: 10 mg/dL (ref 6–23)
CO2: 31 mEq/L (ref 19–32)
Calcium: 9.1 mg/dL (ref 8.4–10.5)
Chloride: 100 mEq/L (ref 96–112)
Creatinine, Ser: 0.61 mg/dL (ref 0.40–1.20)
GFR: 95.85 mL/min (ref >60.00)
Glucose, Bld: 124 mg/dL — ABNORMAL HIGH (ref 70–99)
Potassium: 3.6 mEq/L (ref 3.5–5.1)
Sodium: 142 mEq/L (ref 135–145)

## 2021-07-14 LAB — LIPID PANEL
Cholesterol: 166 mg/dL (ref 0–200)
HDL: 55.6 mg/dL (ref >39.00)
LDL Cholesterol: 73 mg/dL (ref 0–99)
NonHDL: 110.11
Total CHOL/HDL Ratio: 3
Triglycerides: 188 mg/dL — ABNORMAL HIGH (ref 0.0–149.0)
VLDL: 37.6 mg/dL (ref 0.0–40.0)

## 2021-07-14 LAB — HEMOGLOBIN A1C: Hgb A1c MFr Bld: 7 % — ABNORMAL HIGH (ref 4.6–6.5)

## 2021-07-19 ENCOUNTER — Encounter: Payer: Self-pay | Admitting: Internal Medicine

## 2021-07-19 ENCOUNTER — Ambulatory Visit: Payer: BC Managed Care – PPO | Admitting: Internal Medicine

## 2021-07-19 VITALS — BP 118/70 | HR 62 | Temp 97.0°F | Resp 14 | Ht 63.0 in | Wt 198.0 lb

## 2021-07-19 DIAGNOSIS — K219 Gastro-esophageal reflux disease without esophagitis: Secondary | ICD-10-CM

## 2021-07-19 DIAGNOSIS — L501 Idiopathic urticaria: Secondary | ICD-10-CM

## 2021-07-19 DIAGNOSIS — I10 Essential (primary) hypertension: Secondary | ICD-10-CM | POA: Diagnosis not present

## 2021-07-19 DIAGNOSIS — J4530 Mild persistent asthma, uncomplicated: Secondary | ICD-10-CM

## 2021-07-19 DIAGNOSIS — I25118 Atherosclerotic heart disease of native coronary artery with other forms of angina pectoris: Secondary | ICD-10-CM | POA: Diagnosis not present

## 2021-07-19 DIAGNOSIS — E7800 Pure hypercholesterolemia, unspecified: Secondary | ICD-10-CM | POA: Diagnosis not present

## 2021-07-19 DIAGNOSIS — E1159 Type 2 diabetes mellitus with other circulatory complications: Secondary | ICD-10-CM | POA: Diagnosis not present

## 2021-07-19 DIAGNOSIS — D649 Anemia, unspecified: Secondary | ICD-10-CM

## 2021-07-19 NOTE — Progress Notes (Signed)
Patient ID: JULIAN ASKIN, female   DOB: 1961/01/24, 61 y.o.   MRN: [id number] ? ? ?Subjective:  ? ? Patient ID: SHILO PHILIPSON, female    DOB: 1960/07/20, 61 y.o.   MRN: [id number] ? ?This visit occurred during the SARS-CoV-2 public health emergency.  Safety protocols were in place, including screening questions prior to the visit, additional usage of staff PPE, and extensive cleaning of exam room while observing appropriate contact time as indicated for disinfecting solutions.  ? ?Patient here for a scheduled follow up.  ? ?Chief Complaint  ?Patient presents with  ? Follow-up  ?  8wk follow up for DM  ? Diabetes  ? Hypertension  ? .  ? ?HPI ?She is doing relatively well.  Has adjusted her diet.  Lost weight.  Not exercising as much, but plans to hopefully get back in the pool soon.  No chest pain or sob.  Did have sinus infection in April.  Treated.  Better.  Minimal cough at times.  Is better.  Breathing she feels is overall stable.  No acid reflux on current regimen.  Taking nexium in am and pepcid bid.  Discussed decreasing pepcid since acid reflux controlled.  No abdominal pain.  Bowels stable.  Itching better.  S/p injections - knees.  Seeing ortho.  Helps.  Left knee - starting to notice pain again, but overall improved.   ? ? ?Past Medical History:  ?Diagnosis Date  ? Anemia   ? Noted 11/2011  ? Anxiety   ? Chest pain   ? Admitted 11/2011: cath with mild nonobstructive coronary plaque, normal EF, negative cardiac enzymes and negative d-dimer.  ? Diabetes mellitus   ? Fatty liver disease, nonalcoholic 2500  ? GERD (gastroesophageal reflux disease)   ? Hypercholesterolemia   ? Hypertension   ? Reflux   ? TMJ dysfunction   ? ?Past Surgical History:  ?Procedure Laterality Date  ? ABDOMINAL HYSTERECTOMY  2009  ? total  ? BILATERAL SALPINGOOPHORECTOMY  2009  ? benign ovarian tumor  ? HERNIA REPAIR    ? umbilical  ? IMAGE GUIDED SINUS SURGERY N/A 10/22/2015  ? Procedure: IMAGE GUIDED SINUS SURGERY;  Surgeon: Carloyn Manner, MD;  Location: [facility] ORS;  Service: ENT;  Laterality: N/A;  ? KNEE SURGERY Bilateral   ? LEFT HEART CATHETERIZATION WITH CORONARY ANGIOGRAM N/A 12/09/2011  ? Procedure: LEFT HEART CATHETERIZATION WITH CORONARY ANGIOGRAM;  Surgeon: Minus Breeding, MD;  Location: [facility] CATH LAB;  Service: Cardiovascular;  Laterality: N/A;  ? SPHENOIDECTOMY Left 10/22/2015  ? Procedure: SPHENOIDECTOMY;  Surgeon: Carloyn Manner, MD;  Location: [facility] ORS;  Service: ENT;  Laterality: Left;  ? [place]  ? [place]   ? ?Family History  ?Problem Relation Age of Onset  ? Heart disease Father   ?     Father had CABG in his 36s  ? Colon polyps Father   ? Lung cancer Mother 7  ? Breast cancer Paternal Grandmother 60  ? Arthritis/Rheumatoid Paternal Grandfather   ? Breast cancer Paternal Aunt 25  ? ?Social History  ? ?Socioeconomic History  ? Marital status: Married  ?  Spouse name: Not on file  ? Number of children: 2  ? Years of education: Not on file  ? Highest education level: Not on file  ?Occupational History  ? Not on file  ?Tobacco Use  ? Smoking status: Never  ? Smokeless tobacco: Never  ?Vaping Use  ? Vaping Use: Never used  ?Substance and  Sexual Activity  ? Alcohol use: No  ?  Alcohol/week: 0.0 standard drinks  ? Drug use: No  ? Sexual activity: Not on file  ?Other Topics Concern  ? Not on file  ?Social History Narrative  ? Not on file  ? ?Social Determinants of Health  ? ?Financial Resource Strain: Not on file  ?Food Insecurity: Not on file  ?Transportation Needs: Not on file  ?Physical Activity: Not on file  ?Stress: Not on file  ?Social Connections: Not on file  ? ? ? ?Review of Systems  ?Constitutional:  Negative for appetite change and unexpected weight change.  ?HENT:  Negative for congestion and sinus pressure.   ?Respiratory:  Negative for chest tightness and shortness of breath.   ?     Minimal cough at times.    ?Cardiovascular:  Negative for chest pain, palpitations and leg swelling.   ?Gastrointestinal:  Negative for abdominal pain, diarrhea, nausea and vomiting.  ?     No increased acid reflux.    ?Genitourinary:  Negative for difficulty urinating and dysuria.  ?Musculoskeletal:  Negative for myalgias.  ?     Knee pain as outlined.    ?Skin:  Negative for color change and rash.  ?     Itching better.   ?Neurological:  Negative for dizziness, light-headedness and headaches.  ?Psychiatric/Behavioral:  Negative for agitation and dysphoric mood.   ? ?   ?Objective:  ?  ? ?BP 118/70 (BP Location: Left Arm, Patient Position: Sitting, Cuff Size: Large)   Pulse 62   Temp (!) 97 ?F (36.1 ?C) (Temporal)   Resp 14   Ht 5' 3"  (1.6 m)   Wt 198 lb (89.8 kg)   SpO2 95%   BMI 35.07 kg/m?  ?Wt Readings from Last 3 Encounters:  ?07/19/21 198 lb (89.8 kg)  ?05/18/21 203 lb 6.4 oz (92.3 kg)  ?03/29/21 212 lb (96.2 kg)  ? ? ?Physical Exam ?Vitals reviewed.  ?Constitutional:   ?   General: She is not in acute distress. ?   Appearance: Normal appearance.  ?HENT:  ?   Head: Normocephalic and atraumatic.  ?   Right Ear: External ear normal.  ?   Left Ear: External ear normal.  ?Eyes:  ?   General: No scleral icterus.    ?   Right eye: No discharge.     ?   Left eye: No discharge.  ?   Conjunctiva/sclera: Conjunctivae normal.  ?Neck:  ?   Thyroid: No thyromegaly.  ?Cardiovascular:  ?   Rate and Rhythm: Normal rate and regular rhythm.  ?Pulmonary:  ?   Effort: No respiratory distress.  ?   Breath sounds: Normal breath sounds. No wheezing.  ?Abdominal:  ?   General: Bowel sounds are normal.  ?   Palpations: Abdomen is soft.  ?   Tenderness: There is no abdominal tenderness.  ?Musculoskeletal:     ?   General: No swelling or tenderness.  ?   Cervical back: Neck supple. No tenderness.  ?Lymphadenopathy:  ?   Cervical: No cervical adenopathy.  ?Skin: ?   Findings: No erythema or rash.  ?Neurological:  ?   Mental Status: She is alert.  ?Psychiatric:     ?   Mood and Affect: Mood normal.     ?   Behavior: Behavior  normal.  ? ? ? ?Outpatient Encounter Medications as of 07/19/2021  ?Medication Sig  ? acetaminophen (TYLENOL) 500 MG tablet Take 1,000 mg by mouth every 6 (  six) hours as needed for mild pain.  ? acyclovir ointment (ZOVIRAX) 5 % Apply 1 application topically every 3 (three) hours.  ? albuterol (VENTOLIN HFA) 108 (90 Base) MCG/ACT inhaler Inhale 2 puffs into the lungs every 6 (six) hours as needed for wheezing or shortness of breath.  ? Blood Glucose Monitoring Suppl (ONE TOUCH ULTRA 2) w/Device KIT U D TO CHECK BLOOD SUGARS  ? Boric Acid Vaginal 600 MG SUPP Place 1 suppository vaginally daily.  ? cetirizine (ZYRTEC) 10 MG tablet Take 10 mg by mouth daily.  ? clotrimazole (MYCELEX) 10 MG troche Take 1 tablet (10 mg total) by mouth 3 (three) times daily.  ? cyclobenzaprine (FLEXERIL) 10 MG tablet 1/2 tablet q hs prn (do not take without wearing cpap)  ? Dulaglutide (TRULICITY) 3 VH/8.4ON SOPN Inject 0.50m (3 MG) under the skin once weekly as directed  ? escitalopram (LEXAPRO) 20 MG tablet TAKE 1 TABLET(20 MG) BY MOUTH DAILY  ? esomeprazole (NEXIUM) 20 MG capsule Take 20 mg by mouth daily at 12 noon.  ? etodolac (LODINE) 500 MG tablet Take 500 mg by mouth 2 (two) times daily.  ? famotidine (PEPCID) 40 MG tablet Take 40 mg by mouth 2 (two) times daily.  ? FARXIGA 10 MG TABS tablet TAKE 1 TABLET(10 MG) BY MOUTH EVERY MORNING  ? Fe Fum-FePoly-Vit C-Vit B3 (INTEGRA) 62.5-62.5-40-3 MG CAPS Take 1 capsule by mouth 2 (two) times daily.  ? fluticasone (FLONASE) 50 MCG/ACT nasal spray Place 2 sprays into both nostrils daily.  ? gabapentin (NEURONTIN) 100 MG capsule Take 100 mg by mouth daily.  ? levocetirizine (XYZAL) 5 MG tablet SMARTSIG:1 Tablet(s) By Mouth Every Evening  ? lisinopril (ZESTRIL) 20 MG tablet TAKE 1 TABLET(20 MG) BY MOUTH DAILY  ? Melatonin 10 MG TABS Take 10 mg by mouth at bedtime as needed (sleep).  ? meloxicam (MOBIC) 15 MG tablet Take 15 mg by mouth daily.  ? metFORMIN (GLUCOPHAGE) 500 MG tablet Take 1 tablet  (500 mg total) by mouth 2 (two) times daily with a meal.  ? mometasone (ELOCON) 0.1 % cream Apply topically 2 (two) times daily.  ? montelukast (SINGULAIR) 10 MG tablet Take 1 tablet (10 mg total) by mouth at bedtime.  ? mupiro

## 2021-07-25 NOTE — Assessment & Plan Note (Signed)
Follow cbc.  EGD 05/20/13 - mild chronic gastritis.  No repeat EGD recommended at that time.  Colonoscopy 05/20/13 - diverticulosis.   f/u colonoscopy in 10 years.   

## 2021-07-25 NOTE — Assessment & Plan Note (Signed)
Crestor.  Low cholesterol diet and exercise.  Follow lipid panel and liver function tests.   ?Lab Results  ?Component Value Date  ? CHOL 166 07/14/2021  ? HDL 55.60 07/14/2021  ? LDLCALC 73 07/14/2021  ? LDLDIRECT 187.0 03/26/2021  ? TRIG 188.0 (H) 07/14/2021  ? CHOLHDL 3 07/14/2021  ? ?

## 2021-07-25 NOTE — Assessment & Plan Note (Signed)
a1c improved - 7.0.  Has adjusted diet.  Lost more weight.  Plans to start exercising more.  Low carb diet and exercise.  Follow met b and a1c.  Continue trulicity, farxiga and metformin.   ?

## 2021-07-25 NOTE — Assessment & Plan Note (Signed)
On lisinopril.  Blood pressure as outlined.  Follow pressures.  Follow metabolic panel.  

## 2021-07-25 NOTE — Assessment & Plan Note (Signed)
Itching is better.  Follow.  ?

## 2021-07-25 NOTE — Assessment & Plan Note (Signed)
No acid reflux on current regimen.  Continue nexium.  Decrease pepcid to q day.  Follow. 

## 2021-07-25 NOTE — Assessment & Plan Note (Signed)
Breathing stable.

## 2021-07-25 NOTE — Assessment & Plan Note (Signed)
Continue risk factor modification.  Crestor.  

## 2021-08-05 ENCOUNTER — Other Ambulatory Visit (HOSPITAL_COMMUNITY): Payer: Self-pay

## 2021-08-31 ENCOUNTER — Other Ambulatory Visit (HOSPITAL_COMMUNITY): Payer: Self-pay

## 2021-09-07 ENCOUNTER — Other Ambulatory Visit (HOSPITAL_COMMUNITY)
Admission: RE | Admit: 2021-09-07 | Discharge: 2021-09-07 | Disposition: A | Discharge status: Home / Self Care | Payer: BC Managed Care – PPO | Source: Ambulatory Visit | Attending: Family | Admitting: Family

## 2021-09-07 ENCOUNTER — Ambulatory Visit: Payer: BC Managed Care – PPO | Admitting: Family

## 2021-09-07 ENCOUNTER — Encounter: Payer: Self-pay | Admitting: Family

## 2021-09-07 VITALS — BP 116/68 | HR 82 | Temp 98.6°F | Resp 16 | Ht 63.0 in | Wt 197.0 lb

## 2021-09-07 DIAGNOSIS — B998 Other infectious disease: Secondary | ICD-10-CM | POA: Diagnosis not present

## 2021-09-07 DIAGNOSIS — Z1611 Resistance to penicillins: Secondary | ICD-10-CM | POA: Diagnosis not present

## 2021-09-07 DIAGNOSIS — B3749 Other urogenital candidiasis: Secondary | ICD-10-CM | POA: Diagnosis not present

## 2021-09-07 DIAGNOSIS — R300 Dysuria: Secondary | ICD-10-CM | POA: Diagnosis not present

## 2021-09-07 DIAGNOSIS — N898 Other specified noninflammatory disorders of vagina: Secondary | ICD-10-CM | POA: Insufficient documentation

## 2021-09-07 DIAGNOSIS — N3000 Acute cystitis without hematuria: Secondary | ICD-10-CM

## 2021-09-07 LAB — POC URINALSYSI DIPSTICK (AUTOMATED)
Bilirubin, UA: NEGATIVE
Blood, UA: NEGATIVE
Glucose, UA: POSITIVE — AB
Ketones, UA: NEGATIVE
Leukocytes, UA: NEGATIVE
Nitrite, UA: NEGATIVE
Protein, UA: NEGATIVE
Spec Grav, UA: <=1.005 — AB (ref 1.010–1.025)
Urobilinogen, UA: 0.2 E.U./dL
pH, UA: 7 (ref 5.0–8.0)

## 2021-09-07 MED ORDER — NITROFURANTOIN MONOHYD MACRO 100 MG PO CAPS: 100.0000 mg | ORAL_CAPSULE | Freq: Two times a day (BID) | ORAL | 0 refills | Status: AC

## 2021-09-07 NOTE — Assessment & Plan Note (Signed)
poct urine dip in office Urine culture ordered pending results  

## 2021-09-07 NOTE — Progress Notes (Signed)
Established Patient Office Visit  Subjective:  Patient ID: Evelyn James, female    DOB: 1960-09-02  Age: 61 y.o. MRN: [id number]  CC:  Chief Complaint  Patient presents with   Urinary Frequency    Burning and pressure X 4 days been taking AZO    HPI Evelyn James is here today with concerns.   For the last five days with dysuria, lower abd pelvic pressure, and urinary urgency and frequency. No blood seen. Taking azo that is helping slightly.  No flank pain  No fever or chills.   Did have some vaginal itching about a week or so ago but not since.   Does have glucose in dip urine but this is expected with farxiga.   Past Medical History:  Diagnosis Date   Anemia    Noted 11/2011   Anxiety    Chest pain    Admitted 11/2011: cath with mild nonobstructive coronary plaque, normal EF, negative cardiac enzymes and negative d-dimer.   Diabetes mellitus    Fatty liver disease, nonalcoholic 2014   GERD (gastroesophageal reflux disease)    History of COVID-19 06/17/2019   Hypercholesterolemia    Hypertension    Reflux    TMJ dysfunction     Past Surgical History:  Procedure Laterality Date   ABDOMINAL HYSTERECTOMY  2009   total   BILATERAL SALPINGOOPHORECTOMY  2009   benign ovarian tumor   HERNIA REPAIR     umbilical   IMAGE GUIDED SINUS SURGERY N/A 10/22/2015   Procedure: IMAGE GUIDED SINUS SURGERY;  Surgeon: Bud Face, MD;  Location: [facility] ORS;  Service: ENT;  Laterality: N/A;   KNEE SURGERY Bilateral    LEFT HEART CATHETERIZATION WITH CORONARY ANGIOGRAM N/A 12/09/2011   Procedure: LEFT HEART CATHETERIZATION WITH CORONARY ANGIOGRAM;  Surgeon: Rollene Rotunda, MD;  Location: [facility] CATH LAB;  Service: Cardiovascular;  Laterality: N/A;   SPHENOIDECTOMY Left 10/22/2015   Procedure: SPHENOIDECTOMY;  Surgeon: Bud Face, MD;  Location: [facility] ORS;  Service: ENT;  Laterality: Left;   SUBMANDIBULAR MASS EXCISION  1996   [facility]     Family History  Problem Relation Age of  Onset   Heart disease Father        Father had CABG in his 12s   Colon polyps Father    Lung cancer Mother 82   Breast cancer Paternal Grandmother 38   Arthritis/Rheumatoid Paternal Grandfather    Breast cancer Paternal Aunt 93    Social History   Socioeconomic History   Marital status: Married    Spouse name: Not on file   Number of children: 2   Years of education: Not on file   Highest education level: Not on file  Occupational History   Not on file  Tobacco Use   Smoking status: Never   Smokeless tobacco: Never  Vaping Use   Vaping Use: Never used  Substance and Sexual Activity   Alcohol use: No    Alcohol/week: 0.0 standard drinks of alcohol   Drug use: No   Sexual activity: Not on file  Other Topics Concern   Not on file  Social History Narrative   Not on file   Social Determinants of Health   Financial Resource Strain: Not on file  Food Insecurity: Not on file  Transportation Needs: Not on file  Physical Activity: Not on file  Stress: Not on file  Social Connections: Not on file  Intimate Partner Violence: Not on file    Outpatient Medications Prior to  Visit  Medication Sig Dispense Refill   acetaminophen (TYLENOL) 500 MG tablet Take 1,000 mg by mouth every 6 (six) hours as needed for mild pain.     acyclovir ointment (ZOVIRAX) 5 % Apply 1 application topically every 3 (three) hours. 5 g 2   albuterol (VENTOLIN HFA) 108 (90 Base) MCG/ACT inhaler Inhale 2 puffs into the lungs every 6 (six) hours as needed for wheezing or shortness of breath. 8 g 6   Blood Glucose Monitoring Suppl (ONE TOUCH ULTRA 2) w/Device KIT U D TO CHECK BLOOD SUGARS  0   Boric Acid Vaginal 600 MG SUPP Place 1 suppository vaginally daily. 14 suppository 0   cetirizine (ZYRTEC) 10 MG tablet Take 10 mg by mouth daily.     clotrimazole (MYCELEX) 10 MG troche Take 1 tablet (10 mg total) by mouth 3 (three) times daily. 21 Troche 0   cyclobenzaprine (FLEXERIL) 10 MG tablet 1/2 tablet q hs  prn (do not take without wearing cpap) 20 tablet 0   Dulaglutide (TRULICITY) 3 MG/0.5ML SOPN Inject 0.16ml (3 MG) under the skin once a week as directed. 6 mL 1   escitalopram (LEXAPRO) 20 MG tablet TAKE 1 TABLET(20 MG) BY MOUTH DAILY 90 tablet 1   esomeprazole (NEXIUM) 20 MG capsule Take 20 mg by mouth daily at 12 noon.     famotidine (PEPCID) 40 MG tablet Take 40 mg by mouth 2 (two) times daily.     FARXIGA 10 MG TABS tablet TAKE 1 TABLET(10 MG) BY MOUTH EVERY MORNING 90 tablet 1   Fe Fum-FePoly-Vit C-Vit B3 (INTEGRA) 62.5-62.5-40-3 MG CAPS Take 1 capsule by mouth 2 (two) times daily. 180 capsule 1   fluticasone (FLONASE) 50 MCG/ACT nasal spray Place 2 sprays into both nostrils daily. 16 g 5   gabapentin (NEURONTIN) 100 MG capsule Take 100 mg by mouth daily.     levocetirizine (XYZAL) 5 MG tablet SMARTSIG:1 Tablet(s) By Mouth Every Evening     lisinopril (ZESTRIL) 20 MG tablet TAKE 1 TABLET(20 MG) BY MOUTH DAILY 90 tablet 1   Melatonin 10 MG TABS Take 10 mg by mouth at bedtime as needed (sleep).     meloxicam (MOBIC) 15 MG tablet Take 15 mg by mouth daily.  3   metFORMIN (GLUCOPHAGE) 500 MG tablet Take 1 tablet (500 mg total) by mouth 2 (two) times daily with a meal. 180 tablet 1   mometasone (ELOCON) 0.1 % cream Apply topically 2 (two) times daily.     montelukast (SINGULAIR) 10 MG tablet Take 1 tablet (10 mg total) by mouth at bedtime. 30 tablet 4   mupirocin ointment (BACTROBAN) 2 % Place 1 application into the nose 2 (two) times daily. 22 g 0   nystatin cream (MYCOSTATIN) Apply 1 application topically 2 (two) times daily. 30 g 0   ONETOUCH ULTRA test strip USE AS DIRECTED TO CHECK BLOOD SUGAR TWICE DAILY 100 strip 1   Probiotic Product (PHILLIPS COLON HEALTH PO) Take 1 tablet by mouth daily as needed (colon health).      rosuvastatin (CRESTOR) 20 MG tablet TAKE 1 TABLET(20 MG) BY MOUTH DAILY 90 tablet 1   valACYclovir (VALTREX) 1000 MG tablet Take 1 tablet (1,000 mg total) by mouth 2 (two)  times daily. 21 tablet 0   etodolac (LODINE) 500 MG tablet Take 500 mg by mouth 2 (two) times daily. (Patient not taking: Reported on 09/07/2021)     No facility-administered medications prior to visit.    Allergies  Allergen Reactions  Sulfa Antibiotics Other (See Comments)    Topical--red itchy; oral--makes mouth raw Bleeding sores in mouth. Raw mouth and sores on gums         Objective:    Physical Exam Constitutional:      General: She is not in acute distress.    Appearance: Normal appearance. She is well-developed and well-groomed. She is not ill-appearing, toxic-appearing or diaphoretic.  HENT:     Mouth/Throat:     Pharynx: No pharyngeal swelling.     Tonsils: No tonsillar exudate.  Neck:     Thyroid: No thyroid mass.  Pulmonary:     Effort: Pulmonary effort is normal.  Abdominal:     General: Abdomen is flat.     Tenderness: There is abdominal tenderness (suprapubic tenderness). There is no left CVA tenderness.  Musculoskeletal:     Lumbar back: Normal. No tenderness.     Comments: No flank pain no CVA tenderness  Lymphadenopathy:     Cervical:     Right cervical: No superficial cervical adenopathy.    Left cervical: No superficial cervical adenopathy.  Neurological:     General: No focal deficit present.     Mental Status: She is alert and oriented to person, place, and time. Mental status is at baseline.  Psychiatric:        Mood and Affect: Mood normal.        Behavior: Behavior normal.        Thought Content: Thought content normal.        Judgment: Judgment normal.     BP 116/68   Pulse 82   Temp 98.6 F (37 C)   Resp 16   Ht 5\' 3"  (1.6 m)   Wt 197 lb (89.4 kg)   SpO2 98%   BMI 34.90 kg/m  Wt Readings from Last 3 Encounters:  09/07/21 197 lb (89.4 kg)  07/19/21 198 lb (89.8 kg)  05/18/21 203 lb 6.4 oz (92.3 kg)     Health Maintenance Due  Topic Date Due   COVID-19 Vaccine (4 - Pfizer series) 07/01/2021    There are no preventive  care reminders to display for this patient.  Lab Results  Component Value Date   TSH 2.72 08/20/2020   Lab Results  Component Value Date   WBC 8.8 03/26/2021   HGB 13.5 03/26/2021   HCT 42.6 03/26/2021   MCV 90.2 03/26/2021   PLT 213.0 03/26/2021   Lab Results  Component Value Date   NA 142 07/14/2021   K 3.6 07/14/2021   CO2 31 07/14/2021   GLUCOSE 124 (H) 07/14/2021   BUN 10 07/14/2021   CREATININE 0.61 07/14/2021   BILITOT 0.4 07/14/2021   ALKPHOS 62 07/14/2021   AST 13 07/14/2021   ALT 17 07/14/2021   PROT 6.6 07/14/2021   ALBUMIN 4.2 07/14/2021   CALCIUM 9.1 07/14/2021   ANIONGAP 10 10/21/2015   GFR 95.85 07/14/2021   Lab Results  Component Value Date   HGBA1C 7.0 (H) 07/14/2021      Assessment & Plan:   Problem List Items Addressed This Visit       Genitourinary   Acute cystitis without hematuria    antbx sent to pharmacy, pt to take as directed. Encouraged increased water intake throughout the day. Urine culture/reflex pending results. Choosing to treat due to being symptomatic. If no improvement in the next 2 days pt advised to let me know.  rx macrobid 500 mg po bid x 5 days  Vaginal itching    Urinary ancillary testing to r/o yeast and or Bv pending results      Relevant Medications   nitrofurantoin, macrocrystal-monohydrate, (MACROBID) 100 MG capsule   Other Relevant Orders   Urine cytology ancillary only     Other   Dysuria - Primary    poct urine dip in office Urine culture ordered pending results       Relevant Medications   nitrofurantoin, macrocrystal-monohydrate, (MACROBID) 100 MG capsule   Other Relevant Orders   POCT Urinalysis Dipstick (Automated) (Completed)   Urine Culture   Urine cytology ancillary only    Meds ordered this encounter  Medications   nitrofurantoin, macrocrystal-monohydrate, (MACROBID) 100 MG capsule    Sig: Take 1 capsule (100 mg total) by mouth 2 (two) times daily for 5 days.    Dispense:  10  capsule    Refill:  0    Order Specific Question:   Supervising Provider    Answer:   BEDSOLE, AMY E [[id number]]    Follow-up: Return if symptoms worsen or fail to improve with pcp.    Mort Sawyers, FNP

## 2021-09-08 LAB — URINE CYTOLOGY ANCILLARY ONLY
Bacterial Vaginitis-Urine: NEGATIVE
Candida Urine: POSITIVE — AB

## 2021-09-09 ENCOUNTER — Other Ambulatory Visit: Payer: Self-pay | Admitting: Family

## 2021-09-09 DIAGNOSIS — B3731 Acute candidiasis of vulva and vagina: Secondary | ICD-10-CM

## 2021-09-09 LAB — URINE CULTURE
MICRO NUMBER:: 13577979
SPECIMEN QUALITY:: ADEQUATE

## 2021-09-09 MED ORDER — FLUCONAZOLE 150 MG PO TABS: 150.0000 mg | ORAL_TABLET | Freq: Once | ORAL | 0 refills | Status: AC

## 2021-09-09 NOTE — Progress Notes (Unsigned)
[place]

## 2021-09-13 ENCOUNTER — Other Ambulatory Visit: Payer: Self-pay | Admitting: Internal Medicine

## 2021-09-16 ENCOUNTER — Other Ambulatory Visit: Payer: Self-pay | Admitting: Internal Medicine

## 2021-09-21 ENCOUNTER — Ambulatory Visit: Payer: BC Managed Care – PPO | Admitting: Family

## 2021-09-23 ENCOUNTER — Ambulatory Visit: Payer: BC Managed Care – PPO | Admitting: Internal Medicine

## 2021-10-04 ENCOUNTER — Ambulatory Visit: Payer: BC Managed Care – PPO | Admitting: Internal Medicine

## 2021-10-04 ENCOUNTER — Other Ambulatory Visit (HOSPITAL_COMMUNITY)
Admission: RE | Admit: 2021-10-04 | Discharge: 2021-10-04 | Disposition: A | Discharge status: Home / Self Care | Payer: BC Managed Care – PPO | Source: Ambulatory Visit | Attending: Internal Medicine | Admitting: Internal Medicine

## 2021-10-04 ENCOUNTER — Encounter: Payer: Self-pay | Admitting: Internal Medicine

## 2021-10-04 VITALS — BP 126/62 | HR 80 | Temp 98.5°F | Resp 18 | Ht 63.0 in | Wt 195.2 lb

## 2021-10-04 DIAGNOSIS — E1159 Type 2 diabetes mellitus with other circulatory complications: Secondary | ICD-10-CM

## 2021-10-04 DIAGNOSIS — N898 Other specified noninflammatory disorders of vagina: Secondary | ICD-10-CM

## 2021-10-04 DIAGNOSIS — R300 Dysuria: Secondary | ICD-10-CM | POA: Diagnosis not present

## 2021-10-04 DIAGNOSIS — I10 Essential (primary) hypertension: Secondary | ICD-10-CM

## 2021-10-04 DIAGNOSIS — N3000 Acute cystitis without hematuria: Secondary | ICD-10-CM

## 2021-10-04 LAB — URINALYSIS, MICROSCOPIC ONLY: RBC / HPF: NONE SEEN (ref 0–?)

## 2021-10-04 LAB — POCT URINALYSIS DIPSTICK
Bilirubin, UA: NEGATIVE
Blood, UA: NEGATIVE
Glucose, UA: POSITIVE — AB
Nitrite, UA: POSITIVE
Protein, UA: NEGATIVE
Spec Grav, UA: 1.015 (ref 1.010–1.025)
Urobilinogen, UA: 4 E.U./dL — AB
pH, UA: 6 (ref 5.0–8.0)

## 2021-10-04 MED ORDER — CEFDINIR 300 MG PO CAPS: 300.0000 mg | ORAL_CAPSULE | Freq: Two times a day (BID) | ORAL | 0 refills | Status: DC

## 2021-10-04 NOTE — Progress Notes (Unsigned)
Patient ID: Evelyn James, female   DOB: 12-27-1960, 61 y.o.   MRN: [id number]   Subjective:    Patient ID: Evelyn James, female    DOB: [birth year], 61 y.o.   MRN: [id number]  This visit occurred during the SARS-CoV-2 public health emergency.  Safety protocols were in place, including screening questions prior to the visit, additional usage of staff PPE, and extensive cleaning of exam room while observing appropriate contact time as indicated for disinfecting solutions.   Patient here for  Chief Complaint  Patient presents with   Urinary Tract Infection    F/U for UTI still having uti symptoms   .   HPI    Past Medical History:  Diagnosis Date   Anemia    Noted 11/2011   Anxiety    Chest pain    Admitted 11/2011: cath with mild nonobstructive coronary plaque, normal EF, negative cardiac enzymes and negative d-dimer.   Diabetes mellitus    Fatty liver disease, nonalcoholic 7001   GERD (gastroesophageal reflux disease)    History of COVID-19 06/17/2019   Hypercholesterolemia    Hypertension    Reflux    TMJ dysfunction    Past Surgical History:  Procedure Laterality Date   ABDOMINAL HYSTERECTOMY  2009   total   BILATERAL SALPINGOOPHORECTOMY  2009   benign ovarian tumor   HERNIA REPAIR     umbilical   IMAGE GUIDED SINUS SURGERY N/A 10/22/2015   Procedure: IMAGE GUIDED SINUS SURGERY;  Surgeon: Carloyn Manner, MD;  Location: [facility] ORS;  Service: ENT;  Laterality: N/A;   KNEE SURGERY Bilateral    LEFT HEART CATHETERIZATION WITH CORONARY ANGIOGRAM N/A 12/09/2011   Procedure: LEFT HEART CATHETERIZATION WITH CORONARY ANGIOGRAM;  Surgeon: Minus Breeding, MD;  Location: [facility] CATH LAB;  Service: Cardiovascular;  Laterality: N/A;   SPHENOIDECTOMY Left 10/22/2015   Procedure: SPHENOIDECTOMY;  Surgeon: Carloyn Manner, MD;  Location: [facility] ORS;  Service: ENT;  Laterality: Left;   SUBMANDIBULAR MASS EXCISION  1996   [facility]    Family History  Problem Relation Age of Onset   Heart  disease Father        Father had CABG in his 54s   Colon polyps Father    Lung cancer Mother 56   Breast cancer Paternal Grandmother 83   Arthritis/Rheumatoid Paternal Grandfather    Breast cancer Paternal Aunt 5   Social History   Socioeconomic History   Marital status: Married    Spouse name: Not on file   Number of children: 2   Years of education: Not on file   Highest education level: Not on file  Occupational History   Not on file  Tobacco Use   Smoking status: Never   Smokeless tobacco: Never  Vaping Use   Vaping Use: Never used  Substance and Sexual Activity   Alcohol use: No    Alcohol/week: 0.0 standard drinks of alcohol   Drug use: No   Sexual activity: Not on file  Other Topics Concern   Not on file  Social History Narrative   Not on file   Social Determinants of Health   Financial Resource Strain: Not on file  Food Insecurity: Not on file  Transportation Needs: Not on file  Physical Activity: Not on file  Stress: Not on file  Social Connections: Not on file     Review of Systems     Objective:     BP 126/62 (BP Location: Left Arm, Patient Position: Sitting, Cuff Size: Normal)  Pulse 80   Temp 98.5 F (36.9 C) (Oral)   Resp 18   Ht _0  (1.6 m)   Wt 195 lb 3.2 oz (88.5 kg)   SpO2 94%   BMI 34.58 kg/m  Wt Readings from Last 3 Encounters:  10/04/21 195 lb 3.2 oz (88.5 kg)  09/07/21 197 lb (89.4 kg)  07/19/21 198 lb (89.8 kg)    Physical Exam   Outpatient Encounter Medications as of 10/04/2021  Medication Sig   acetaminophen (TYLENOL) 500 MG tablet Take 1,000 mg by mouth every 6 (six) hours as needed for mild pain.   acyclovir ointment (ZOVIRAX) 5 % Apply 1 application topically every 3 (three) hours.   albuterol (VENTOLIN HFA) 108 (90 Base) MCG/ACT inhaler Inhale 2 puffs into the lungs every 6 (six) hours as needed for wheezing or shortness of breath.   Blood Glucose Monitoring Suppl (ONE TOUCH ULTRA 2) w/Device KIT U D TO CHECK  BLOOD SUGARS   cetirizine (ZYRTEC) 10 MG tablet Take 10 mg by mouth daily.   clotrimazole (MYCELEX) 10 MG troche Take 1 tablet (10 mg total) by mouth 3 (three) times daily.   cyclobenzaprine (FLEXERIL) 10 MG tablet 1/2 tablet q hs prn (do not take without wearing cpap)   Dulaglutide (TRULICITY) 3 JX/9.1YN SOPN Inject 0.65m (3 MG) under the skin once a week as directed.   escitalopram (LEXAPRO) 20 MG tablet TAKE 1 TABLET(20 MG) BY MOUTH DAILY   esomeprazole (NEXIUM) 20 MG capsule Take 20 mg by mouth daily at 12 noon.   famotidine (PEPCID) 40 MG tablet Take 40 mg by mouth 2 (two) times daily.   FARXIGA 10 MG TABS tablet TAKE 1 TABLET(10 MG) BY MOUTH EVERY MORNING   Fe Fum-FePoly-Vit C-Vit B3 (INTEGRA) 62.5-62.5-40-3 MG CAPS Take 1 capsule by mouth 2 (two) times daily.   fluticasone (FLONASE) 50 MCG/ACT nasal spray Place 2 sprays into both nostrils daily.   gabapentin (NEURONTIN) 100 MG capsule Take 100 mg by mouth daily.   levocetirizine (XYZAL) 5 MG tablet SMARTSIG:1 Tablet(s) By Mouth Every Evening   lisinopril (ZESTRIL) 20 MG tablet TAKE 1 TABLET(20 MG) BY MOUTH DAILY   Melatonin 10 MG TABS Take 10 mg by mouth at bedtime as needed (sleep).   meloxicam (MOBIC) 15 MG tablet Take 15 mg by mouth daily.   metFORMIN (GLUCOPHAGE) 500 MG tablet TAKE 1 TABLET(500 MG) BY MOUTH TWICE DAILY WITH A MEAL   mometasone (ELOCON) 0.1 % cream Apply topically 2 (two) times daily.   montelukast (SINGULAIR) 10 MG tablet Take 1 tablet (10 mg total) by mouth at bedtime.   nystatin cream (MYCOSTATIN) Apply 1 application topically 2 (two) times daily.   ONETOUCH ULTRA test strip USE AS DIRECTED TO CHECK BLOOD SUGAR TWICE DAILY   Probiotic Product (PHILLIPS COLON HEALTH PO) Take 1 tablet by mouth daily as needed (colon health).    rosuvastatin (CRESTOR) 20 MG tablet TAKE 1 TABLET(20 MG) BY MOUTH DAILY   valACYclovir (VALTREX) 1000 MG tablet Take 1 tablet (1,000 mg total) by mouth 2 (two) times daily.   Boric Acid  Vaginal 600 MG SUPP Place 1 suppository vaginally daily. (Patient not taking: Reported on 10/04/2021)   mupirocin ointment (BACTROBAN) 2 % Place 1 application into the nose 2 (two) times daily. (Patient not taking: Reported on 10/04/2021)   No facility-administered encounter medications on file as of 10/04/2021.     Lab Results  Component Value Date   WBC 8.8 03/26/2021   HGB 13.5 03/26/2021  HCT 42.6 03/26/2021   PLT 213.0 03/26/2021   GLUCOSE 124 (H) 07/14/2021   CHOL 166 07/14/2021   TRIG 188.0 (H) 07/14/2021   HDL 55.60 07/14/2021   LDLDIRECT 187.0 03/26/2021   LDLCALC 73 07/14/2021   ALT 17 07/14/2021   AST 13 07/14/2021   NA 142 07/14/2021   K 3.6 07/14/2021   CL 100 07/14/2021   CREATININE 0.61 07/14/2021   BUN 10 07/14/2021   CO2 31 07/14/2021   TSH 2.72 08/20/2020   HGBA1C 7.0 (H) 07/14/2021   MICROALBUR 1.2 08/20/2020       Assessment & Plan:   Problem List Items Addressed This Visit     Dysuria - Primary   Relevant Orders   POCT Urinalysis Dipstick   Urine Microscopic Only   Urine Culture     Einar Pheasant, MD

## 2021-10-05 ENCOUNTER — Encounter: Payer: Self-pay | Admitting: Internal Medicine

## 2021-10-05 LAB — CERVICOVAGINAL ANCILLARY ONLY
Bacterial Vaginitis (gardnerella): NEGATIVE
Candida Glabrata: POSITIVE — AB
Candida Vaginitis: NEGATIVE
Comment: NEGATIVE
Comment: NEGATIVE
Comment: NEGATIVE
Comment: NEGATIVE
Trichomonas: NEGATIVE

## 2021-10-05 LAB — URINE CULTURE
MICRO NUMBER:: 13685627
Result:: NO GROWTH
SPECIMEN QUALITY:: ADEQUATE

## 2021-10-05 NOTE — Assessment & Plan Note (Signed)
Last a1c improved - as outlined previously - 7.0.  Continues low carb diet and exercise.  Continue trulicity, farxiga and metformin.  Sugars as outlined.  Follow.

## 2021-10-05 NOTE — Assessment & Plan Note (Signed)
Symptoms as outlined.  Urine dip reviewed - positive nitrite and leukocytes present.  Reviewed recent urine culture.  Treat with omnicef as directed.  Probiotic as directed.  Await culture results.  Vaginal swab obtained.  F/u results.  Stay hydrated.

## 2021-10-05 NOTE — Assessment & Plan Note (Signed)
On lisinopril.  Blood pressure as outlined.  Follow metabolic panel.   

## 2021-10-06 ENCOUNTER — Telehealth: Payer: Self-pay

## 2021-10-06 ENCOUNTER — Other Ambulatory Visit: Payer: Self-pay

## 2021-10-06 MED ORDER — FLUCONAZOLE 150 MG PO TABS: ORAL_TABLET | ORAL | 0 refills | Status: DC

## 2021-10-06 NOTE — Telephone Encounter (Signed)
LMTCB for lab result °

## 2021-10-07 ENCOUNTER — Ambulatory Visit: Payer: BC Managed Care – PPO | Admitting: Internal Medicine

## 2021-10-11 ENCOUNTER — Other Ambulatory Visit: Payer: Self-pay | Admitting: Internal Medicine

## 2021-11-24 ENCOUNTER — Other Ambulatory Visit (INDEPENDENT_AMBULATORY_CARE_PROVIDER_SITE_OTHER): Payer: BC Managed Care – PPO

## 2021-11-24 DIAGNOSIS — D649 Anemia, unspecified: Secondary | ICD-10-CM | POA: Diagnosis not present

## 2021-11-24 DIAGNOSIS — E7800 Pure hypercholesterolemia, unspecified: Secondary | ICD-10-CM | POA: Diagnosis not present

## 2021-11-24 DIAGNOSIS — E1159 Type 2 diabetes mellitus with other circulatory complications: Secondary | ICD-10-CM

## 2021-11-24 DIAGNOSIS — I10 Essential (primary) hypertension: Secondary | ICD-10-CM

## 2021-11-24 LAB — CBC WITH DIFFERENTIAL/PLATELET
Basophils Absolute: 0 10*3/uL (ref 0.0–0.1)
Basophils Relative: 0.5 % (ref 0.0–3.0)
Eosinophils Absolute: 0.2 10*3/uL (ref 0.0–0.7)
Eosinophils Relative: 2.3 % (ref 0.0–5.0)
HCT: 40.3 % (ref 36.0–46.0)
Hemoglobin: 13 g/dL (ref 12.0–15.0)
Lymphocytes Relative: 34.7 % (ref 12.0–46.0)
Lymphs Abs: 2.8 10*3/uL (ref 0.7–4.0)
MCHC: 32.2 g/dL (ref 30.0–36.0)
MCV: 88.5 fl (ref 78.0–100.0)
Monocytes Absolute: 0.6 10*3/uL (ref 0.1–1.0)
Monocytes Relative: 7.1 % (ref 3.0–12.0)
Neutro Abs: 4.5 10*3/uL (ref 1.4–7.7)
Neutrophils Relative %: 55.4 % (ref 43.0–77.0)
Platelets: 212 10*3/uL (ref 150.0–400.0)
RBC: 4.56 Mil/uL (ref 3.87–5.11)
RDW: 14.5 % (ref 11.5–15.5)
WBC: 8 10*3/uL (ref 4.0–10.5)

## 2021-11-24 LAB — HEMOGLOBIN A1C: Hgb A1c MFr Bld: 6.9 % — ABNORMAL HIGH (ref 4.6–6.5)

## 2021-11-24 LAB — TSH: TSH: 3.01 u[IU]/mL (ref 0.35–5.50)

## 2021-11-24 LAB — MICROALBUMIN / CREATININE URINE RATIO
Creatinine,U: 73.5 mg/dL
Microalb Creat Ratio: 1.1 mg/g (ref 0.0–30.0)
Microalb, Ur: 0.8 mg/dL (ref 0.0–1.9)

## 2021-11-24 LAB — HEPATIC FUNCTION PANEL
ALT: 13 U/L (ref 0–35)
AST: 11 U/L (ref 0–37)
Albumin: 4 g/dL (ref 3.5–5.2)
Alkaline Phosphatase: 68 U/L (ref 39–117)
Bilirubin, Direct: 0.1 mg/dL (ref 0.0–0.3)
Total Bilirubin: 0.3 mg/dL (ref 0.2–1.2)
Total Protein: 6.6 g/dL (ref 6.0–8.3)

## 2021-11-24 LAB — LIPID PANEL
Cholesterol: 158 mg/dL (ref 0–200)
HDL: 58.9 mg/dL (ref >39.00)
LDL Cholesterol: 74 mg/dL (ref 0–99)
NonHDL: 99.53
Total CHOL/HDL Ratio: 3
Triglycerides: 126 mg/dL (ref 0.0–149.0)
VLDL: 25.2 mg/dL (ref 0.0–40.0)

## 2021-11-24 LAB — BASIC METABOLIC PANEL
BUN: 14 mg/dL (ref 6–23)
CO2: 29 mEq/L (ref 19–32)
Calcium: 9.5 mg/dL (ref 8.4–10.5)
Chloride: 102 mEq/L (ref 96–112)
Creatinine, Ser: 0.6 mg/dL (ref 0.40–1.20)
GFR: 95.99 mL/min (ref >60.00)
Glucose, Bld: 109 mg/dL — ABNORMAL HIGH (ref 70–99)
Potassium: 4 mEq/L (ref 3.5–5.1)
Sodium: 141 mEq/L (ref 135–145)

## 2021-11-29 ENCOUNTER — Other Ambulatory Visit: Payer: BC Managed Care – PPO

## 2021-12-03 ENCOUNTER — Ambulatory Visit (INDEPENDENT_AMBULATORY_CARE_PROVIDER_SITE_OTHER): Payer: BC Managed Care – PPO | Admitting: Internal Medicine

## 2021-12-03 ENCOUNTER — Other Ambulatory Visit (HOSPITAL_COMMUNITY)
Admission: RE | Admit: 2021-12-03 | Discharge: 2021-12-03 | Disposition: A | Discharge status: Home / Self Care | Payer: BC Managed Care – PPO | Source: Ambulatory Visit | Attending: Internal Medicine | Admitting: Internal Medicine

## 2021-12-03 ENCOUNTER — Encounter: Payer: Self-pay | Admitting: Internal Medicine

## 2021-12-03 VITALS — BP 128/74 | HR 85 | Temp 98.2°F | Ht 63.0 in | Wt 197.2 lb

## 2021-12-03 DIAGNOSIS — Z124 Encounter for screening for malignant neoplasm of cervix: Secondary | ICD-10-CM

## 2021-12-03 DIAGNOSIS — Z23 Encounter for immunization: Secondary | ICD-10-CM

## 2021-12-03 DIAGNOSIS — I25118 Atherosclerotic heart disease of native coronary artery with other forms of angina pectoris: Secondary | ICD-10-CM | POA: Diagnosis not present

## 2021-12-03 DIAGNOSIS — E1159 Type 2 diabetes mellitus with other circulatory complications: Secondary | ICD-10-CM

## 2021-12-03 DIAGNOSIS — E7800 Pure hypercholesterolemia, unspecified: Secondary | ICD-10-CM

## 2021-12-03 DIAGNOSIS — Z0000 Encounter for general adult medical examination without abnormal findings: Secondary | ICD-10-CM

## 2021-12-03 DIAGNOSIS — K219 Gastro-esophageal reflux disease without esophagitis: Secondary | ICD-10-CM

## 2021-12-03 DIAGNOSIS — D649 Anemia, unspecified: Secondary | ICD-10-CM

## 2021-12-03 DIAGNOSIS — I10 Essential (primary) hypertension: Secondary | ICD-10-CM

## 2021-12-03 DIAGNOSIS — W19XXXA Unspecified fall, initial encounter: Secondary | ICD-10-CM

## 2021-12-03 DIAGNOSIS — J4530 Mild persistent asthma, uncomplicated: Secondary | ICD-10-CM

## 2021-12-03 DIAGNOSIS — Z9181 History of falling: Secondary | ICD-10-CM

## 2021-12-03 NOTE — Progress Notes (Signed)
Patient ID: MAHOGANI HOLOHAN, female   DOB: 12/26/1960, 61 y.o.   MRN: [id number]   Subjective:    Patient ID: YAMEL BALE, female    DOB: [birth year], 61 y.o.   MRN: [id number]   Patient here for  Chief Complaint  Patient presents with   Follow-up    Complete Physical exam , no issues   .   HPI Reports she is doing relatively well.  No chest pain or sob reported.  No abdominal pain. Bowels moving.  Discussed labs.  A1c 6.9.  triglycerides are improving.  Discussed diet and exercise.  Wearing socks - slipped.  Denies any significant residual issues from the fall.     Past Medical History:  Diagnosis Date   Anemia    Noted 11/2011   Anxiety    Chest pain    Admitted 11/2011: cath with mild nonobstructive coronary plaque, normal EF, negative cardiac enzymes and negative d-dimer.   Diabetes mellitus    Fatty liver disease, nonalcoholic 4034   GERD (gastroesophageal reflux disease)    History of COVID-19 06/17/2019   Hypercholesterolemia    Hypertension    Reflux    TMJ dysfunction    Past Surgical History:  Procedure Laterality Date   ABDOMINAL HYSTERECTOMY  2009   total   BILATERAL SALPINGOOPHORECTOMY  2009   benign ovarian tumor   HERNIA REPAIR     umbilical   IMAGE GUIDED SINUS SURGERY N/A 10/22/2015   Procedure: IMAGE GUIDED SINUS SURGERY;  Surgeon: Carloyn Manner, MD;  Location: [facility] ORS;  Service: ENT;  Laterality: N/A;   KNEE SURGERY Bilateral    LEFT HEART CATHETERIZATION WITH CORONARY ANGIOGRAM N/A 12/09/2011   Procedure: LEFT HEART CATHETERIZATION WITH CORONARY ANGIOGRAM;  Surgeon: Minus Breeding, MD;  Location: [facility] CATH LAB;  Service: Cardiovascular;  Laterality: N/A;   SPHENOIDECTOMY Left 10/22/2015   Procedure: SPHENOIDECTOMY;  Surgeon: Carloyn Manner, MD;  Location: [facility] ORS;  Service: ENT;  Laterality: Left;   SUBMANDIBULAR MASS EXCISION  1996   [facility]    Family History  Problem Relation Age of Onset   Heart disease Father        Father had CABG in his 15s    Colon polyps Father    Lung cancer Mother 23   Breast cancer Paternal Grandmother 79   Arthritis/Rheumatoid Paternal Grandfather    Breast cancer Paternal Aunt 48   Social History   Socioeconomic History   Marital status: Married    Spouse name: Not on file   Number of children: 2   Years of education: Not on file   Highest education level: Not on file  Occupational History   Not on file  Tobacco Use   Smoking status: Never   Smokeless tobacco: Never  Vaping Use   Vaping Use: Never used  Substance and Sexual Activity   Alcohol use: No    Alcohol/week: 0.0 standard drinks of alcohol   Drug use: No   Sexual activity: Not on file  Other Topics Concern   Not on file  Social History Narrative   Not on file   Social Determinants of Health   Financial Resource Strain: Not on file  Food Insecurity: Not on file  Transportation Needs: Not on file  Physical Activity: Not on file  Stress: Not on file  Social Connections: Not on file     Review of Systems  Constitutional:  Negative for appetite change and unexpected weight change.  HENT:  Negative for congestion, sinus pressure  and sore throat.   Eyes:  Negative for pain and visual disturbance.  Respiratory:  Negative for cough, chest tightness and shortness of breath.   Cardiovascular:  Negative for chest pain, palpitations and leg swelling.  Gastrointestinal:  Negative for abdominal pain, diarrhea, nausea and vomiting.  Genitourinary:  Negative for difficulty urinating and dysuria.  Musculoskeletal:  Negative for joint swelling and myalgias.  Skin:  Negative for color change and rash.  Neurological:  Negative for dizziness, light-headedness and headaches.  Hematological:  Negative for adenopathy. Does not bruise/bleed easily.  Psychiatric/Behavioral:  Negative for agitation and dysphoric mood.        Objective:     BP 128/74 (BP Location: Left Arm, Patient Position: Sitting, Cuff Size: Normal)   Pulse 85   Temp  98.2 F (36.8 C) (Oral)   Ht _0  (1.6 m)   Wt 197 lb 3.2 oz (89.4 kg)   SpO2 94%   BMI 34.93 kg/m  Wt Readings from Last 3 Encounters:  12/03/21 197 lb 3.2 oz (89.4 kg)  10/04/21 195 lb 3.2 oz (88.5 kg)  09/07/21 197 lb (89.4 kg)    Physical Exam Vitals reviewed.  Constitutional:      General: She is not in acute distress.    Appearance: Normal appearance. She is well-developed.  HENT:     Head: Normocephalic and atraumatic.     Right Ear: External ear normal.     Left Ear: External ear normal.  Eyes:     General: No scleral icterus.       Right eye: No discharge.        Left eye: No discharge.     Conjunctiva/sclera: Conjunctivae normal.  Neck:     Thyroid: No thyromegaly.  Cardiovascular:     Rate and Rhythm: Normal rate and regular rhythm.  Pulmonary:     Effort: No tachypnea, accessory muscle usage or respiratory distress.     Breath sounds: Normal breath sounds. No decreased breath sounds or wheezing.  Chest:  Breasts:    Right: No inverted nipple, mass, nipple discharge or tenderness (no axillary adenopathy).     Left: No inverted nipple, mass, nipple discharge or tenderness (no axilarry adenopathy).  Abdominal:     General: Bowel sounds are normal.     Palpations: Abdomen is soft.     Tenderness: There is no abdominal tenderness.  Genitourinary:    Comments: Normal external genitalia.  Vaginal vault without lesions.  Cervix identified.  Pap smear performed.  Could not appreciate any adnexal masses or tenderness.   Musculoskeletal:        General: No swelling or tenderness.     Cervical back: Neck supple.  Lymphadenopathy:     Cervical: No cervical adenopathy.  Skin:    Findings: No erythema or rash.  Neurological:     Mental Status: She is alert and oriented to person, place, and time.  Psychiatric:        Mood and Affect: Mood normal.        Behavior: Behavior normal.      Outpatient Encounter Medications as of 12/03/2021  Medication Sig    acetaminophen (TYLENOL) 500 MG tablet Take 1,000 mg by mouth every 6 (six) hours as needed for mild pain.   acyclovir ointment (ZOVIRAX) 5 % Apply 1 application topically every 3 (three) hours.   albuterol (VENTOLIN HFA) 108 (90 Base) MCG/ACT inhaler Inhale 2 puffs into the lungs every 6 (six) hours as needed for wheezing or shortness of  breath.   Blood Glucose Monitoring Suppl (ONE TOUCH ULTRA 2) w/Device KIT U D TO CHECK BLOOD SUGARS   cetirizine (ZYRTEC) 10 MG tablet Take 10 mg by mouth daily.   clotrimazole (MYCELEX) 10 MG troche Take 1 tablet (10 mg total) by mouth 3 (three) times daily.   cyclobenzaprine (FLEXERIL) 10 MG tablet 1/2 tablet q hs prn (do not take without wearing cpap)   Dulaglutide (TRULICITY) 3 PJ/0.9TO SOPN Inject 0.97m (3 MG) under the skin once a week as directed.   escitalopram (LEXAPRO) 20 MG tablet TAKE 1 TABLET(20 MG) BY MOUTH DAILY   esomeprazole (NEXIUM) 20 MG capsule Take 20 mg by mouth daily at 12 noon.   famotidine (PEPCID) 40 MG tablet Take 40 mg by mouth 2 (two) times daily.   FARXIGA 10 MG TABS tablet TAKE 1 TABLET(10 MG) BY MOUTH EVERY MORNING   Fe Fum-FePoly-Vit C-Vit B3 (INTEGRA) 62.5-62.5-40-3 MG CAPS Take 1 capsule by mouth 2 (two) times daily.   fluconazole (DIFLUCAN) 150 MG tablet Take one tablet by mouth for a total of one dose. If symptoms persist may repeat in 3 days.   fluticasone (FLONASE) 50 MCG/ACT nasal spray Place 2 sprays into both nostrils daily.   gabapentin (NEURONTIN) 100 MG capsule Take 100 mg by mouth daily.   levocetirizine (XYZAL) 5 MG tablet SMARTSIG:1 Tablet(s) By Mouth Every Evening   lisinopril (ZESTRIL) 20 MG tablet TAKE 1 TABLET(20 MG) BY MOUTH DAILY   Melatonin 10 MG TABS Take 10 mg by mouth at bedtime as needed (sleep).   meloxicam (MOBIC) 15 MG tablet Take 15 mg by mouth daily.   metFORMIN (GLUCOPHAGE) 500 MG tablet TAKE 1 TABLET(500 MG) BY MOUTH TWICE DAILY WITH A MEAL   mometasone (ELOCON) 0.1 % cream Apply topically 2 (two)  times daily.   montelukast (SINGULAIR) 10 MG tablet Take 1 tablet (10 mg total) by mouth at bedtime.   nystatin cream (MYCOSTATIN) Apply 1 application topically 2 (two) times daily.   ONETOUCH ULTRA test strip USE AS DIRECTED TO CHECK BLOOD SUGAR TWICE DAILY   Probiotic Product (PHILLIPS COLON HEALTH PO) Take 1 tablet by mouth daily as needed (colon health).    rosuvastatin (CRESTOR) 20 MG tablet TAKE 1 TABLET(20 MG) BY MOUTH DAILY   valACYclovir (VALTREX) 1000 MG tablet Take 1 tablet (1,000 mg total) by mouth 2 (two) times daily.   [DISCONTINUED] cefdinir (OMNICEF) 300 MG capsule Take 1 capsule (300 mg total) by mouth 2 (two) times daily. (Patient not taking: Reported on 12/02/2021)   No facility-administered encounter medications on file as of 12/03/2021.     Lab Results  Component Value Date   WBC 8.0 11/24/2021   HGB 13.0 11/24/2021   HCT 40.3 11/24/2021   PLT 212.0 11/24/2021   GLUCOSE 109 (H) 11/24/2021   CHOL 158 11/24/2021   TRIG 126.0 11/24/2021   HDL 58.90 11/24/2021   LDLDIRECT 187.0 03/26/2021   LDLCALC 74 11/24/2021   ALT 13 11/24/2021   AST 11 11/24/2021   NA 141 11/24/2021   K 4.0 11/24/2021   CL 102 11/24/2021   CREATININE 0.60 11/24/2021   BUN 14 11/24/2021   CO2 29 11/24/2021   TSH 3.01 11/24/2021   HGBA1C 6.9 (H) 11/24/2021   MICROALBUR 0.8 11/24/2021       Assessment & Plan:   Problem List Items Addressed This Visit     Anemia    Follow cbc.  EGD 05/20/13 - mild chronic gastritis.  No repeat EGD recommended  at that time.  Colonoscopy 05/20/13 - diverticulosis.   f/u colonoscopy in 10 years.        Asthma    Breathing stable.       CAD (coronary artery disease)    Continue risk factor modification.  Crestor.       Diabetes mellitus with circulatory complication (HCC)    Last a1c improved - as outlined previously - 6.9.  Continues low carb diet and exercise.  Continue trulicity, farxiga and metformin.  Sugars as outlined.  Follow.       Relevant  Orders   Basic metabolic panel   Hemoglobin A1c   Fall    Fall as outlined.  Discussed not wearing socks.  No residual problems.  Follow.       GERD (gastroesophageal reflux disease)    No acid reflux on current regimen.  Continue nexium.  Decrease pepcid to q day.  Follow.      Healthcare maintenance    Physical today 12/03/21.  Colonoscopy 05/2018 - recommended f/u colonoscopy in 5 years.  Mammogram 04/27/21 - Birads I.  PAP 12/03/21.       Hypercholesterolemia    Crestor.  Low cholesterol diet and exercise.  Follow lipid panel and liver function tests.   Lab Results  Component Value Date   CHOL 158 11/24/2021   HDL 58.90 11/24/2021   LDLCALC 74 11/24/2021   LDLDIRECT 187.0 03/26/2021   TRIG 126.0 11/24/2021   CHOLHDL 3 11/24/2021        Relevant Orders   Hepatic function panel   Lipid panel   Hypertension    On lisinopril. Blood pressure as outlined.  Follow metabolic panel.       Other Visit Diagnoses     Pap smear for cervical cancer screening    -  Primary   Relevant Orders   Cytology - PAP( [facility]) (Completed)        Einar Pheasant, MD

## 2021-12-03 NOTE — Assessment & Plan Note (Addendum)
Physical today 12/03/21.  Colonoscopy 05/2018 - recommended f/u colonoscopy in 5 years.  Mammogram 04/27/21 - Birads I.  PAP 12/03/21.

## 2021-12-06 LAB — CYTOLOGY - PAP
Comment: NEGATIVE
Diagnosis: NEGATIVE
High risk HPV: NEGATIVE

## 2021-12-11 ENCOUNTER — Encounter: Payer: Self-pay | Admitting: Internal Medicine

## 2021-12-11 DIAGNOSIS — W19XXXA Unspecified fall, initial encounter: Secondary | ICD-10-CM | POA: Insufficient documentation

## 2021-12-11 NOTE — Assessment & Plan Note (Signed)
No acid reflux on current regimen.  Continue nexium.  Decrease pepcid to q day.  Follow.

## 2021-12-11 NOTE — Assessment & Plan Note (Signed)
Crestor.  Low cholesterol diet and exercise.  Follow lipid panel and liver function tests.   Lab Results  Component Value Date   CHOL 158 11/24/2021   HDL 58.90 11/24/2021   LDLCALC 74 11/24/2021   LDLDIRECT 187.0 03/26/2021   TRIG 126.0 11/24/2021   CHOLHDL 3 11/24/2021

## 2021-12-11 NOTE — Assessment & Plan Note (Signed)
Fall as outlined.  Discussed not wearing socks.  No residual problems.  Follow.

## 2021-12-11 NOTE — Assessment & Plan Note (Signed)
Breathing stable.

## 2021-12-11 NOTE — Assessment & Plan Note (Signed)
Last a1c improved - as outlined previously - 6.9.  Continues low carb diet and exercise.  Continue trulicity, farxiga and metformin.  Sugars as outlined.  Follow.

## 2021-12-11 NOTE — Assessment & Plan Note (Signed)
Follow cbc.  EGD 05/20/13 - mild chronic gastritis.  No repeat EGD recommended at that time.  Colonoscopy 05/20/13 - diverticulosis.   f/u colonoscopy in 10 years.   

## 2021-12-11 NOTE — Assessment & Plan Note (Signed)
On lisinopril.  Blood pressure as outlined.  Follow metabolic panel.   

## 2021-12-11 NOTE — Assessment & Plan Note (Signed)
Continue risk factor modification.  Crestor.  

## 2021-12-24 ENCOUNTER — Other Ambulatory Visit: Payer: Self-pay | Admitting: Internal Medicine

## 2022-03-15 ENCOUNTER — Encounter: Payer: Self-pay | Admitting: Internal Medicine

## 2022-03-16 ENCOUNTER — Other Ambulatory Visit: Payer: Self-pay

## 2022-03-16 MED ORDER — METFORMIN HCL 500 MG PO TABS: ORAL_TABLET | ORAL | 1 refills | Status: DC

## 2022-03-18 ENCOUNTER — Other Ambulatory Visit: Payer: Self-pay | Admitting: Internal Medicine

## 2022-03-18 DIAGNOSIS — E1159 Type 2 diabetes mellitus with other circulatory complications: Secondary | ICD-10-CM

## 2022-03-18 MED ORDER — TRULICITY 3 MG/0.5ML ~~LOC~~ SOAJ: SUBCUTANEOUS | 1 refills | Status: DC

## 2022-03-22 ENCOUNTER — Encounter: Payer: Self-pay | Admitting: Internal Medicine

## 2022-03-23 ENCOUNTER — Other Ambulatory Visit (HOSPITAL_COMMUNITY): Payer: Self-pay

## 2022-03-23 ENCOUNTER — Other Ambulatory Visit: Payer: Self-pay

## 2022-03-23 DIAGNOSIS — E1159 Type 2 diabetes mellitus with other circulatory complications: Secondary | ICD-10-CM

## 2022-03-23 MED ORDER — TRULICITY 3 MG/0.5ML ~~LOC~~ SOAJ
3.0000 mg | SUBCUTANEOUS | 1 refills | Status: DC
  Filled 2022-03-23: qty 2, 28d supply, fill #0
  Filled 2022-08-03 – 2022-08-05 (×3): qty 2, 28d supply, fill #1
  Filled 2022-08-05: qty 2, 28d supply, fill #0
  Filled 2022-08-31: qty 2, 28d supply, fill #1
  Filled 2022-09-28: qty 2, 28d supply, fill #2
  Filled 2022-10-25: qty 2, 28d supply, fill #3
  Filled 2022-11-22: qty 2, 28d supply, fill #4

## 2022-03-31 ENCOUNTER — Other Ambulatory Visit (INDEPENDENT_AMBULATORY_CARE_PROVIDER_SITE_OTHER): Payer: BC Managed Care – PPO

## 2022-03-31 DIAGNOSIS — E1159 Type 2 diabetes mellitus with other circulatory complications: Secondary | ICD-10-CM

## 2022-03-31 DIAGNOSIS — E7800 Pure hypercholesterolemia, unspecified: Secondary | ICD-10-CM

## 2022-03-31 LAB — BASIC METABOLIC PANEL
BUN: 12 mg/dL (ref 6–23)
CO2: 33 mEq/L — ABNORMAL HIGH (ref 19–32)
Calcium: 9.5 mg/dL (ref 8.4–10.5)
Chloride: 99 mEq/L (ref 96–112)
Creatinine, Ser: 0.6 mg/dL (ref 0.40–1.20)
GFR: 95.75 mL/min (ref >60.00)
Glucose, Bld: 116 mg/dL — ABNORMAL HIGH (ref 70–99)
Potassium: 4.5 mEq/L (ref 3.5–5.1)
Sodium: 141 mEq/L (ref 135–145)

## 2022-03-31 LAB — HEMOGLOBIN A1C: Hgb A1c MFr Bld: 7.2 % — ABNORMAL HIGH (ref 4.6–6.5)

## 2022-03-31 LAB — LIPID PANEL
Cholesterol: 151 mg/dL (ref 0–200)
HDL: 61.2 mg/dL (ref >39.00)
LDL Cholesterol: 63 mg/dL (ref 0–99)
NonHDL: 90.08
Total CHOL/HDL Ratio: 2
Triglycerides: 135 mg/dL (ref 0.0–149.0)
VLDL: 27 mg/dL (ref 0.0–40.0)

## 2022-03-31 LAB — HEPATIC FUNCTION PANEL
ALT: 13 U/L (ref 0–35)
AST: 12 U/L (ref 0–37)
Albumin: 4.3 g/dL (ref 3.5–5.2)
Alkaline Phosphatase: 61 U/L (ref 39–117)
Bilirubin, Direct: 0.1 mg/dL (ref 0.0–0.3)
Total Bilirubin: 0.3 mg/dL (ref 0.2–1.2)
Total Protein: 6.6 g/dL (ref 6.0–8.3)

## 2022-04-05 ENCOUNTER — Encounter: Payer: Self-pay | Admitting: Internal Medicine

## 2022-04-05 ENCOUNTER — Ambulatory Visit (INDEPENDENT_AMBULATORY_CARE_PROVIDER_SITE_OTHER): Payer: BC Managed Care – PPO | Admitting: Internal Medicine

## 2022-04-05 VITALS — BP 114/72 | HR 86 | Temp 97.9°F | Resp 16 | Ht 63.0 in | Wt 194.8 lb

## 2022-04-05 DIAGNOSIS — Z1231 Encounter for screening mammogram for malignant neoplasm of breast: Secondary | ICD-10-CM | POA: Diagnosis not present

## 2022-04-05 DIAGNOSIS — B001 Herpesviral vesicular dermatitis: Secondary | ICD-10-CM

## 2022-04-05 DIAGNOSIS — D649 Anemia, unspecified: Secondary | ICD-10-CM

## 2022-04-05 DIAGNOSIS — E1159 Type 2 diabetes mellitus with other circulatory complications: Secondary | ICD-10-CM | POA: Diagnosis not present

## 2022-04-05 DIAGNOSIS — I25118 Atherosclerotic heart disease of native coronary artery with other forms of angina pectoris: Secondary | ICD-10-CM

## 2022-04-05 DIAGNOSIS — K219 Gastro-esophageal reflux disease without esophagitis: Secondary | ICD-10-CM

## 2022-04-05 DIAGNOSIS — I10 Essential (primary) hypertension: Secondary | ICD-10-CM

## 2022-04-05 DIAGNOSIS — J4530 Mild persistent asthma, uncomplicated: Secondary | ICD-10-CM

## 2022-04-05 DIAGNOSIS — E7800 Pure hypercholesterolemia, unspecified: Secondary | ICD-10-CM

## 2022-04-05 DIAGNOSIS — R0981 Nasal congestion: Secondary | ICD-10-CM

## 2022-04-05 LAB — HM DIABETES FOOT EXAM

## 2022-04-05 MED ORDER — METFORMIN HCL ER 500 MG PO TB24: ORAL_TABLET | ORAL | 5 refills | Status: DC

## 2022-04-05 MED ORDER — ACYCLOVIR 5 % EX OINT: 1.0000 | TOPICAL_OINTMENT | Freq: Four times a day (QID) | CUTANEOUS | 0 refills | Status: AC

## 2022-04-05 MED ORDER — CEFDINIR 300 MG PO CAPS: 300.0000 mg | ORAL_CAPSULE | Freq: Two times a day (BID) | ORAL | 0 refills | Status: DC

## 2022-04-05 NOTE — Patient Instructions (Signed)
I sent in the antibiotic.  Take a probiotic daily while on the antibiotic and for two weeks after completing the antibiotic.    Examples of probiotics:  culturelle, align or florastor  Change metformin to metformin XR 500mg  - two tablets twice a day.

## 2022-04-05 NOTE — Progress Notes (Signed)
Subjective:    Patient ID: Evelyn James, female    DOB: 1960-12-30, 62 y.o.   MRN: [id number]  Patient here for a scheduled follow up.   HPI Here to follow up regarding her blood sugar, blood pressure and cholesterol.  Has been seeing ortho for her knees.  S/p injections.  Reports increased congestion, sinus pressure and some cough.  States symptoms started two weeks ago. Is feeling some better, but has increased sinus pressure and nasal congestion - yellow mucus.  Some cough (no increased cough or coughing fits).  No chest pain or sob.  No wheezing.  Occasional cough - productive green mucus.  Fever blister - upper lip.  Increased stress with her husband's health and medical issues.  Discussed labs.  A1c increased - 7.2.  has not been watching diet.  Not exercising.  Not checking sugars.     Past Medical History:  Diagnosis Date   Anemia    Noted 11/2011   Anxiety    Chest pain    Admitted 11/2011: cath with mild nonobstructive coronary plaque, normal EF, negative cardiac enzymes and negative d-dimer.   Diabetes mellitus    Fatty liver disease, nonalcoholic 1941   GERD (gastroesophageal reflux disease)    History of COVID-19 06/17/2019   Hypercholesterolemia    Hypertension    Reflux    TMJ dysfunction    Past Surgical History:  Procedure Laterality Date   ABDOMINAL HYSTERECTOMY  2009   total   BILATERAL SALPINGOOPHORECTOMY  2009   benign ovarian tumor   HERNIA REPAIR     umbilical   IMAGE GUIDED SINUS SURGERY N/A 10/22/2015   Procedure: IMAGE GUIDED SINUS SURGERY;  Surgeon: Carloyn Manner, MD;  Location: [facility] ORS;  Service: ENT;  Laterality: N/A;   KNEE SURGERY Bilateral    LEFT HEART CATHETERIZATION WITH CORONARY ANGIOGRAM N/A 12/09/2011   Procedure: LEFT HEART CATHETERIZATION WITH CORONARY ANGIOGRAM;  Surgeon: Minus Breeding, MD;  Location: [facility] CATH LAB;  Service: Cardiovascular;  Laterality: N/A;   SPHENOIDECTOMY Left 10/22/2015   Procedure: SPHENOIDECTOMY;  Surgeon:  Carloyn Manner, MD;  Location: [facility] ORS;  Service: ENT;  Laterality: Left;   SUBMANDIBULAR MASS EXCISION  1996   [facility]    Family History  Problem Relation Age of Onset   Heart disease Father        Father had CABG in his 7s   Colon polyps Father    Lung cancer Mother 60   Breast cancer Paternal Grandmother 24   Arthritis/Rheumatoid Paternal Grandfather    Breast cancer Paternal Aunt 22   Social History   Socioeconomic History   Marital status: Married    Spouse name: Not on file   Number of children: 2   Years of education: Not on file   Highest education level: Not on file  Occupational History   Not on file  Tobacco Use   Smoking status: Never   Smokeless tobacco: Never  Vaping Use   Vaping Use: Never used  Substance and Sexual Activity   Alcohol use: No    Alcohol/week: 0.0 standard drinks of alcohol   Drug use: No   Sexual activity: Not on file  Other Topics Concern   Not on file  Social History Narrative   Not on file   Social Determinants of Health   Financial Resource Strain: Not on file  Food Insecurity: Not on file  Transportation Needs: Not on file  Physical Activity: Not on file  Stress: Not on file  Social Connections: Not on file     Review of Systems  Constitutional:  Negative for appetite change and unexpected weight change.  HENT:  Positive for congestion and sinus pressure.   Respiratory:  Positive for cough. Negative for chest tightness and shortness of breath.   Cardiovascular:  Negative for chest pain, palpitations and leg swelling.  Gastrointestinal:  Negative for abdominal pain, diarrhea, nausea and vomiting.  Genitourinary:  Negative for difficulty urinating and dysuria.  Musculoskeletal:  Negative for joint swelling and myalgias.  Skin:  Negative for color change and rash.  Neurological:  Negative for dizziness and headaches.  Psychiatric/Behavioral:  Negative for agitation and dysphoric mood.        Increased stress as outlined.         Objective:     BP 114/72   Pulse 86   Temp 97.9 F (36.6 C)   Resp 16   Ht 5\' 3"  (1.6 m)   Wt 194 lb 12.8 oz (88.4 kg)   SpO2 97%   BMI 34.51 kg/m  Wt Readings from Last 3 Encounters:  04/05/22 194 lb 12.8 oz (88.4 kg)  12/03/21 197 lb 3.2 oz (89.4 kg)  10/04/21 195 lb 3.2 oz (88.5 kg)    Physical Exam Vitals reviewed.  Constitutional:      General: She is not in acute distress.    Appearance: Normal appearance.  HENT:     Head: Normocephalic and atraumatic.     Right Ear: External ear normal.     Left Ear: External ear normal.  Eyes:     General: No scleral icterus.       Right eye: No discharge.        Left eye: No discharge.     Conjunctiva/sclera: Conjunctivae normal.  Neck:     Thyroid: No thyromegaly.  Cardiovascular:     Rate and Rhythm: Normal rate and regular rhythm.  Pulmonary:     Effort: No respiratory distress.     Breath sounds: Normal breath sounds. No wheezing.  Abdominal:     General: Bowel sounds are normal.     Palpations: Abdomen is soft.     Tenderness: There is no abdominal tenderness.  Musculoskeletal:        General: No swelling or tenderness.     Cervical back: Neck supple. No tenderness.  Lymphadenopathy:     Cervical: No cervical adenopathy.  Skin:    Findings: No erythema or rash.  Neurological:     Mental Status: She is alert.  Psychiatric:        Mood and Affect: Mood normal.        Behavior: Behavior normal.      Outpatient Encounter Medications as of 04/05/2022  Medication Sig   cefdinir (OMNICEF) 300 MG capsule Take 1 capsule (300 mg total) by mouth 2 (two) times daily.   metFORMIN (GLUCOPHAGE-XR) 500 MG 24 hr tablet Take two tablets bid   acetaminophen (TYLENOL) 500 MG tablet Take 1,000 mg by mouth every 6 (six) hours as needed for mild pain.   acyclovir ointment (ZOVIRAX) 5 % Apply 1 Application topically every 6 (six) hours.   albuterol (VENTOLIN HFA) 108 (90 Base) MCG/ACT inhaler Inhale 2 puffs into the  lungs every 6 (six) hours as needed for wheezing or shortness of breath.   Blood Glucose Monitoring Suppl (ONE TOUCH ULTRA 2) w/Device KIT U D TO CHECK BLOOD SUGARS   cetirizine (ZYRTEC) 10 MG tablet Take 10 mg by mouth daily.  clotrimazole (MYCELEX) 10 MG troche Take 1 tablet (10 mg total) by mouth 3 (three) times daily.   cyclobenzaprine (FLEXERIL) 10 MG tablet 1/2 tablet q hs prn (do not take without wearing cpap)   Dulaglutide (TRULICITY) 3 0000000 SOPN Inject 3 mg into the skin every 7 (seven) days as directed.   escitalopram (LEXAPRO) 20 MG tablet TAKE 1 TABLET(20 MG) BY MOUTH DAILY   esomeprazole (NEXIUM) 20 MG capsule Take 20 mg by mouth daily at 12 noon.   famotidine (PEPCID) 40 MG tablet Take 40 mg by mouth 2 (two) times daily.   FARXIGA 10 MG TABS tablet TAKE 1 TABLET(10 MG) BY MOUTH EVERY MORNING   Fe Fum-FePoly-Vit C-Vit B3 (INTEGRA) 62.5-62.5-40-3 MG CAPS Take 1 capsule by mouth 2 (two) times daily.   fluconazole (DIFLUCAN) 150 MG tablet Take one tablet by mouth for a total of one dose. If symptoms persist may repeat in 3 days.   fluticasone (FLONASE) 50 MCG/ACT nasal spray Place 2 sprays into both nostrils daily.   gabapentin (NEURONTIN) 100 MG capsule Take 100 mg by mouth daily.   levocetirizine (XYZAL) 5 MG tablet SMARTSIG:1 Tablet(s) By Mouth Every Evening   lisinopril (ZESTRIL) 20 MG tablet TAKE 1 TABLET(20 MG) BY MOUTH DAILY   Melatonin 10 MG TABS Take 10 mg by mouth at bedtime as needed (sleep).   meloxicam (MOBIC) 15 MG tablet Take 15 mg by mouth daily.   mometasone (ELOCON) 0.1 % cream Apply topically 2 (two) times daily.   montelukast (SINGULAIR) 10 MG tablet Take 1 tablet (10 mg total) by mouth at bedtime.   nystatin cream (MYCOSTATIN) Apply 1 application topically 2 (two) times daily.   ONETOUCH ULTRA test strip USE AS DIRECTED TO CHECK BLOOD SUGAR TWICE DAILY   Probiotic Product (PHILLIPS COLON HEALTH PO) Take 1 tablet by mouth daily as needed (colon health).     rosuvastatin (CRESTOR) 20 MG tablet TAKE 1 TABLET(20 MG) BY MOUTH DAILY   valACYclovir (VALTREX) 1000 MG tablet Take 1 tablet (1,000 mg total) by mouth 2 (two) times daily.   [DISCONTINUED] acyclovir ointment (ZOVIRAX) 5 % Apply 1 application topically every 3 (three) hours.   [DISCONTINUED] cyclobenzaprine (FLEXERIL) 10 MG tablet 1/2 tablet q hs prn (do not take without wearing cpap)   [DISCONTINUED] metFORMIN (GLUCOPHAGE) 500 MG tablet TAKE 1 TABLET(500 MG) BY MOUTH TWICE DAILY WITH A MEAL   No facility-administered encounter medications on file as of 04/05/2022.     Lab Results  Component Value Date   WBC 8.0 11/24/2021   HGB 13.0 11/24/2021   HCT 40.3 11/24/2021   PLT 212.0 11/24/2021   GLUCOSE 116 (H) 03/31/2022   CHOL 151 03/31/2022   TRIG 135.0 03/31/2022   HDL 61.20 03/31/2022   LDLDIRECT 187.0 03/26/2021   LDLCALC 63 03/31/2022   ALT 13 03/31/2022   AST 12 03/31/2022   NA 141 03/31/2022   K 4.5 03/31/2022   CL 99 03/31/2022   CREATININE 0.60 03/31/2022   BUN 12 03/31/2022   CO2 33 (H) 03/31/2022   TSH 3.01 11/24/2021   HGBA1C 7.2 (H) 03/31/2022   MICROALBUR 0.8 11/24/2021    No results found.     Assessment & Plan:  Type 2 diabetes mellitus with other circulatory complication, without long-term current use of insulin (HCC) Assessment & Plan: Last a1c increased - 7.2.  Continues low carb diet and exercise.  Continue trulicity, farxiga and metformin.  Given elevation, discussed increasing metformin to 1000mg  bid.  Will change  to XR metformin. Follow sugars.  Follow met b and A1c.   Orders: -     Hemoglobin A1c; Future  Hypercholesterolemia Assessment & Plan: Crestor.  Low cholesterol diet and exercise.  Follow lipid panel and liver function tests.   Lab Results  Component Value Date   CHOL 151 03/31/2022   HDL 61.20 03/31/2022   LDLCALC 63 03/31/2022   LDLDIRECT 187.0 03/26/2021   TRIG 135.0 03/31/2022   CHOLHDL 2 03/31/2022    Orders: -     Lipid  panel; Future -     Hepatic function panel; Future  Primary hypertension Assessment & Plan: On lisinopril. Blood pressure as outlined.  Follow metabolic panel.   Orders: -     Basic metabolic panel; Future  Visit for screening mammogram -     3D Screening Mammogram, Left and Right; Future  Recurrent cold sores -     Acyclovir; Apply 1 Application topically every 6 (six) hours.  Dispense: 15 g; Refill: 0  Anemia, unspecified type Assessment & Plan: Follow cbc.  EGD 05/20/13 - mild chronic gastritis.  No repeat EGD recommended at that time.  Colonoscopy 05/20/13 - diverticulosis.   f/u colonoscopy in 10 years.     Mild persistent asthma, unspecified whether complicated Assessment & Plan: Treat acute infection.  Breathing overall stable.     Coronary artery disease involving native coronary artery of native heart with other form of angina pectoris [facility]) Assessment & Plan: Continue risk factor modification.  Crestor.    Cold sore Assessment & Plan: Zovirax ointment as directed.  Follow.    Gastroesophageal reflux disease, unspecified whether esophagitis present Assessment & Plan: No acid reflux on current regimen.  Continue nexium.  Follow.   Congestion of nasal sinus Assessment & Plan: Symptoms as outlined with nasal congestion and cough productive of colored mucus.  Symptoms c/w sinus/uri.  Treat with omnicef as directed.  Saline nasal spray.  Robitussin DM.  Follow.  Call with update.     Other orders -     metFORMIN HCl ER; Take two tablets bid  Dispense: 120 tablet; Refill: 5 -     Cefdinir; Take 1 capsule (300 mg total) by mouth 2 (two) times daily.  Dispense: 20 capsule; Refill: 0 -     Cyclobenzaprine HCl; 1/2 tablet q hs prn (do not take without wearing cpap)  Dispense: 20 tablet; Refill: 0     [facility], MD

## 2022-04-10 ENCOUNTER — Encounter: Payer: Self-pay | Admitting: Internal Medicine

## 2022-04-10 DIAGNOSIS — R0981 Nasal congestion: Secondary | ICD-10-CM | POA: Insufficient documentation

## 2022-04-10 MED ORDER — CYCLOBENZAPRINE HCL 10 MG PO TABS: ORAL_TABLET | ORAL | 0 refills | Status: DC

## 2022-04-10 NOTE — Assessment & Plan Note (Signed)
Continue risk factor modification.  Crestor.

## 2022-04-10 NOTE — Assessment & Plan Note (Signed)
Follow cbc.  EGD 05/20/13 - mild chronic gastritis.  No repeat EGD recommended at that time.  Colonoscopy 05/20/13 - diverticulosis.   f/u colonoscopy in 10 years.

## 2022-04-10 NOTE — Assessment & Plan Note (Signed)
Crestor.  Low cholesterol diet and exercise.  Follow lipid panel and liver function tests.   Lab Results  Component Value Date   CHOL 151 03/31/2022   HDL 61.20 03/31/2022   LDLCALC 63 03/31/2022   LDLDIRECT 187.0 03/26/2021   TRIG 135.0 03/31/2022   CHOLHDL 2 03/31/2022

## 2022-04-10 NOTE — Assessment & Plan Note (Signed)
On lisinopril. Blood pressure as outlined.  Follow metabolic panel.

## 2022-04-10 NOTE — Assessment & Plan Note (Signed)
Last a1c increased - 7.2.  Continues low carb diet and exercise.  Continue trulicity, farxiga and metformin.  Given elevation, discussed increasing metformin to 1000mg  bid.  Will change to XR metformin. Follow sugars.  Follow met b and A1c.

## 2022-04-10 NOTE — Assessment & Plan Note (Signed)
Symptoms as outlined with nasal congestion and cough productive of colored mucus.  Symptoms c/w sinus/uri.  Treat with omnicef as directed.  Saline nasal spray.  Robitussin DM.  Follow.  Call with update.

## 2022-04-10 NOTE — Assessment & Plan Note (Signed)
Treat acute infection.  Breathing overall stable.

## 2022-04-10 NOTE — Assessment & Plan Note (Signed)
Zovirax ointment as directed.  Follow.

## 2022-04-10 NOTE — Assessment & Plan Note (Signed)
No acid reflux on current regimen.  Continue nexium.  Follow.

## 2022-04-21 ENCOUNTER — Ambulatory Visit: Payer: BC Managed Care – PPO | Admitting: Internal Medicine

## 2022-04-21 ENCOUNTER — Encounter: Payer: Self-pay | Admitting: Internal Medicine

## 2022-04-21 VITALS — BP 112/70 | HR 90 | Temp 98.2°F | Resp 16 | Ht 63.0 in | Wt 194.8 lb

## 2022-04-21 DIAGNOSIS — E1159 Type 2 diabetes mellitus with other circulatory complications: Secondary | ICD-10-CM

## 2022-04-21 DIAGNOSIS — E7800 Pure hypercholesterolemia, unspecified: Secondary | ICD-10-CM

## 2022-04-21 DIAGNOSIS — D649 Anemia, unspecified: Secondary | ICD-10-CM | POA: Diagnosis not present

## 2022-04-21 DIAGNOSIS — G4452 New daily persistent headache (NDPH): Secondary | ICD-10-CM

## 2022-04-21 DIAGNOSIS — R0981 Nasal congestion: Secondary | ICD-10-CM

## 2022-04-21 DIAGNOSIS — K219 Gastro-esophageal reflux disease without esophagitis: Secondary | ICD-10-CM

## 2022-04-21 DIAGNOSIS — J4530 Mild persistent asthma, uncomplicated: Secondary | ICD-10-CM

## 2022-04-21 DIAGNOSIS — I10 Essential (primary) hypertension: Secondary | ICD-10-CM | POA: Diagnosis not present

## 2022-04-21 DIAGNOSIS — I25118 Atherosclerotic heart disease of native coronary artery with other forms of angina pectoris: Secondary | ICD-10-CM

## 2022-04-21 NOTE — Progress Notes (Signed)
Subjective:    Patient ID: Evelyn James, female    DOB: March 31, 1960, 62 y.o.   MRN: [id number]  Patient here for  Chief Complaint  Patient presents with   Headache    HPI Work in appt for headache. Last visit 04/05/22 - reported increased sinus pressure and nasal congestion.  Treated with omnicef and nasal sprays.  States 1/31 - mucus loose.  Finished abx 04/15/22.  Felt pretty normal.  2/4 - headache started.  Some drainage down her throat.  Using humidifier.  Headache - frontal, maxillary region and top of head.  Intermittent - top of head.  States has taken aleve, thyenol and previous excedrin migraine.  Helped some.  Rates headache as 3-4/10.  Not worse headache of life.  No fever. No rash.  No bites.  Previous loose bowels.  Using flonase.  Reports history of steroids giving her a headache.     Past Medical History:  Diagnosis Date   Anemia    Noted 11/2011   Anxiety    Chest pain    Admitted 11/2011: cath with mild nonobstructive coronary plaque, normal EF, negative cardiac enzymes and negative d-dimer.   Diabetes mellitus    Fatty liver disease, nonalcoholic [id number]   GERD (gastroesophageal reflux disease)    History of COVID-19 06/17/2019   Hypercholesterolemia    Hypertension    Reflux    TMJ dysfunction    Past Surgical History:  Procedure Laterality Date   ABDOMINAL HYSTERECTOMY  2009   total   BILATERAL SALPINGOOPHORECTOMY  2009   benign ovarian tumor   HERNIA REPAIR     umbilical   IMAGE GUIDED SINUS SURGERY N/A 10/22/2015   Procedure: IMAGE GUIDED SINUS SURGERY;  Surgeon: Carloyn Manner, MD;  Location: [facility] ORS;  Service: ENT;  Laterality: N/A;   KNEE SURGERY Bilateral    LEFT HEART CATHETERIZATION WITH CORONARY ANGIOGRAM N/A 12/09/2011   Procedure: LEFT HEART CATHETERIZATION WITH CORONARY ANGIOGRAM;  Surgeon: Minus Breeding, MD;  Location: [facility] CATH LAB;  Service: Cardiovascular;  Laterality: N/A;   SPHENOIDECTOMY Left 10/22/2015   Procedure: SPHENOIDECTOMY;   Surgeon: Carloyn Manner, MD;  Location: [facility] ORS;  Service: ENT;  Laterality: Left;   SUBMANDIBULAR MASS EXCISION  1996   [facility]    Family History  Problem Relation Age of Onset   Heart disease Father        Father had CABG in his 70s   Colon polyps Father    Lung cancer Mother 30   Breast cancer Paternal Grandmother 57   Arthritis/Rheumatoid Paternal Grandfather    Breast cancer Paternal Aunt 38   Social History   Socioeconomic History   Marital status: Married    Spouse name: Not on file   Number of children: 2   Years of education: Not on file   Highest education level: Not on file  Occupational History   Not on file  Tobacco Use   Smoking status: Never   Smokeless tobacco: Never  Vaping Use   Vaping Use: Never used  Substance and Sexual Activity   Alcohol use: No    Alcohol/week: 0.0 standard drinks of alcohol   Drug use: No   Sexual activity: Not on file  Other Topics Concern   Not on file  Social History Narrative   Not on file   Social Determinants of Health   Financial Resource Strain: Not on file  Food Insecurity: Not on file  Transportation Needs: Not on file  Physical Activity: Not on  file  Stress: Not on file  Social Connections: Not on file     Review of Systems  Constitutional:  Negative for appetite change and unexpected weight change.  HENT:  Positive for congestion.   Respiratory:  Negative for cough, chest tightness and shortness of breath.   Cardiovascular:  Negative for chest pain, palpitations and leg swelling.  Gastrointestinal:  Negative for abdominal pain, diarrhea, nausea and vomiting.  Genitourinary:  Negative for difficulty urinating and dysuria.  Musculoskeletal:  Negative for joint swelling and myalgias.  Skin:  Negative for color change and rash.  Neurological:  Positive for headaches. Negative for dizziness.  Psychiatric/Behavioral:  Negative for agitation and dysphoric mood.        Objective:     BP 112/70   Pulse 90    Temp 98.2 F (36.8 C)   Resp 16   Ht 5' 3"$  (1.6 m)   Wt 194 lb 12.8 oz (88.4 kg)   SpO2 97%   BMI 34.51 kg/m  Wt Readings from Last 3 Encounters:  04/21/22 194 lb 12.8 oz (88.4 kg)  04/05/22 194 lb 12.8 oz (88.4 kg)  12/03/21 197 lb 3.2 oz (89.4 kg)    Physical Exam Vitals reviewed.  Constitutional:      General: She is not in acute distress.    Appearance: Normal appearance.  HENT:     Head: Normocephalic and atraumatic.     Right Ear: External ear normal.     Left Ear: External ear normal.  Eyes:     General: No scleral icterus.       Right eye: No discharge.        Left eye: No discharge.     Conjunctiva/sclera: Conjunctivae normal.  Neck:     Thyroid: No thyromegaly.  Cardiovascular:     Rate and Rhythm: Normal rate and regular rhythm.  Pulmonary:     Effort: No respiratory distress.     Breath sounds: Normal breath sounds. No wheezing.  Abdominal:     General: Bowel sounds are normal.     Palpations: Abdomen is soft.     Tenderness: There is no abdominal tenderness.  Musculoskeletal:        General: No swelling or tenderness.     Cervical back: Neck supple. No tenderness.  Lymphadenopathy:     Cervical: No cervical adenopathy.  Skin:    Findings: No erythema or rash.  Neurological:     Mental Status: She is alert.  Psychiatric:        Mood and Affect: Mood normal.        Behavior: Behavior normal.      Outpatient Encounter Medications as of 04/21/2022  Medication Sig   acetaminophen (TYLENOL) 500 MG tablet Take 1,000 mg by mouth every 6 (six) hours as needed for mild pain.   acyclovir ointment (ZOVIRAX) 5 % Apply 1 Application topically every 6 (six) hours.   albuterol (VENTOLIN HFA) 108 (90 Base) MCG/ACT inhaler Inhale 2 puffs into the lungs every 6 (six) hours as needed for wheezing or shortness of breath.   Blood Glucose Monitoring Suppl (ONE TOUCH ULTRA 2) w/Device KIT U D TO CHECK BLOOD SUGARS   cetirizine (ZYRTEC) 10 MG tablet Take 10 mg by  mouth daily.   clotrimazole (MYCELEX) 10 MG troche Take 1 tablet (10 mg total) by mouth 3 (three) times daily.   cyclobenzaprine (FLEXERIL) 10 MG tablet 1/2 tablet q hs prn (do not take without wearing cpap)   Dulaglutide (TRULICITY) 3  MG/0.5ML SOPN Inject 3 mg into the skin every 7 (seven) days as directed.   escitalopram (LEXAPRO) 20 MG tablet TAKE 1 TABLET(20 MG) BY MOUTH DAILY   esomeprazole (NEXIUM) 20 MG capsule Take 20 mg by mouth daily at 12 noon.   famotidine (PEPCID) 40 MG tablet Take 40 mg by mouth 2 (two) times daily.   FARXIGA 10 MG TABS tablet TAKE 1 TABLET(10 MG) BY MOUTH EVERY MORNING   Fe Fum-FePoly-Vit C-Vit B3 (INTEGRA) 62.5-62.5-40-3 MG CAPS Take 1 capsule by mouth 2 (two) times daily.   fluconazole (DIFLUCAN) 150 MG tablet Take one tablet by mouth for a total of one dose. If symptoms persist may repeat in 3 days.   fluticasone (FLONASE) 50 MCG/ACT nasal spray Place 2 sprays into both nostrils daily.   gabapentin (NEURONTIN) 100 MG capsule Take 100 mg by mouth daily.   levocetirizine (XYZAL) 5 MG tablet SMARTSIG:1 Tablet(s) By Mouth Every Evening   lisinopril (ZESTRIL) 20 MG tablet TAKE 1 TABLET(20 MG) BY MOUTH DAILY   Melatonin 10 MG TABS Take 10 mg by mouth at bedtime as needed (sleep).   meloxicam (MOBIC) 15 MG tablet Take 15 mg by mouth daily.   metFORMIN (GLUCOPHAGE-XR) 500 MG 24 hr tablet Take two tablets bid   mometasone (ELOCON) 0.1 % cream Apply topically 2 (two) times daily.   montelukast (SINGULAIR) 10 MG tablet Take 1 tablet (10 mg total) by mouth at bedtime.   nystatin cream (MYCOSTATIN) Apply 1 application topically 2 (two) times daily.   ONETOUCH ULTRA test strip USE AS DIRECTED TO CHECK BLOOD SUGAR TWICE DAILY   Probiotic Product (PHILLIPS COLON HEALTH PO) Take 1 tablet by mouth daily as needed (colon health).    rosuvastatin (CRESTOR) 20 MG tablet TAKE 1 TABLET(20 MG) BY MOUTH DAILY   valACYclovir (VALTREX) 1000 MG tablet Take 1 tablet (1,000 mg total) by  mouth 2 (two) times daily.   [DISCONTINUED] cefdinir (OMNICEF) 300 MG capsule Take 1 capsule (300 mg total) by mouth 2 (two) times daily.   No facility-administered encounter medications on file as of 04/21/2022.     Lab Results  Component Value Date   WBC 11.3 (H) 04/21/2022   HGB 13.3 04/21/2022   HCT 41.0 04/21/2022   PLT 268.0 04/21/2022   GLUCOSE 90 04/21/2022   CHOL 151 03/31/2022   TRIG 135.0 03/31/2022   HDL 61.20 03/31/2022   LDLDIRECT 187.0 03/26/2021   LDLCALC 63 03/31/2022   ALT 13 03/31/2022   AST 12 03/31/2022   NA 139 04/21/2022   K 4.2 04/21/2022   CL 99 04/21/2022   CREATININE 0.63 04/21/2022   BUN 19 04/21/2022   CO2 28 04/21/2022   TSH 3.01 11/24/2021   HGBA1C 7.2 (H) 03/31/2022   MICROALBUR 0.8 11/24/2021       Assessment & Plan:  New daily persistent headache Assessment & Plan: Is s/p sinus surgery - fungus ball.  With increased headache - last several days.  Recently treated for sinus infection.  Off abx.  Saline nasal spray as directed.  Stop flonase in case aggravating her headache.  Robitussin as directed.  Check ESR and CRP.  Discussed if persistent despite above and pending lab results, may need f/u head scan/MRI.   Orders: -     Sedimentation rate -     C-reactive protein  Primary hypertension Assessment & Plan: On lisinopril. Blood pressure as outlined.  Follow metabolic panel.   Orders: -     CBC with Differential/Platelet -  Basic metabolic panel  Anemia, unspecified type Assessment & Plan: Follow cbc.  EGD 05/20/13 - mild chronic gastritis.  No repeat EGD recommended at that time.  Colonoscopy 05/20/13 - diverticulosis.   f/u colonoscopy in 10 years.     Mild persistent asthma, unspecified whether complicated Assessment & Plan: Breathing stable.    Congestion of nasal sinus Assessment & Plan: Just treated with omnicef.  Persistent headache.  Discussed possible etiologies.  Hold on further abx.  Continue humidifier.  Robitussin  - to thin mucus.  Stop flonase in case aggravating headache.  Saline nasal spray.  Call with update.    Coronary artery disease involving native coronary artery of native heart with other form of angina pectoris [facility]) Assessment & Plan: Continue risk factor modification.  Crestor.    Type 2 diabetes mellitus with other circulatory complication, without long-term current use of insulin (HCC) Assessment & Plan: Last a1c increased - 7.2.  Continues low carb diet and exercise.  Continue trulicity, farxiga and metformin.  Changed to XR metformin last visit. Follow sugars.  Follow met b and A1c.    Gastroesophageal reflux disease, unspecified whether esophagitis present Assessment & Plan: No acid reflux on current regimen.  Continue nexium.  Follow.   Hypercholesterolemia Assessment & Plan: Crestor.  Low cholesterol diet and exercise.  Follow lipid panel and liver function tests.   Lab Results  Component Value Date   CHOL 151 03/31/2022   HDL 61.20 03/31/2022   LDLCALC 63 03/31/2022   LDLDIRECT 187.0 03/26/2021   TRIG 135.0 03/31/2022   CHOLHDL 2 03/31/2022        Einar Pheasant, MD

## 2022-04-22 LAB — BASIC METABOLIC PANEL
BUN: 19 mg/dL (ref 6–23)
CO2: 28 mEq/L (ref 19–32)
Calcium: 9.9 mg/dL (ref 8.4–10.5)
Chloride: 99 mEq/L (ref 96–112)
Creatinine, Ser: 0.63 mg/dL (ref 0.40–1.20)
GFR: 94.59 mL/min (ref >60.00)
Glucose, Bld: 90 mg/dL (ref 70–99)
Potassium: 4.2 mEq/L (ref 3.5–5.1)
Sodium: 139 mEq/L (ref 135–145)

## 2022-04-22 LAB — CBC WITH DIFFERENTIAL/PLATELET
Basophils Absolute: 0.1 10*3/uL (ref 0.0–0.1)
Basophils Relative: 0.9 % (ref 0.0–3.0)
Eosinophils Absolute: 0.2 10*3/uL (ref 0.0–0.7)
Eosinophils Relative: 1.6 % (ref 0.0–5.0)
HCT: 41 % (ref 36.0–46.0)
Hemoglobin: 13.3 g/dL (ref 12.0–15.0)
Lymphocytes Relative: 31.9 % (ref 12.0–46.0)
Lymphs Abs: 3.6 10*3/uL (ref 0.7–4.0)
MCHC: 32.6 g/dL (ref 30.0–36.0)
MCV: 87.2 fl (ref 78.0–100.0)
Monocytes Absolute: 0.8 10*3/uL (ref 0.1–1.0)
Monocytes Relative: 6.9 % (ref 3.0–12.0)
Neutro Abs: 6.6 10*3/uL (ref 1.4–7.7)
Neutrophils Relative %: 58.7 % (ref 43.0–77.0)
Platelets: 268 10*3/uL (ref 150.0–400.0)
RBC: 4.7 Mil/uL (ref 3.87–5.11)
RDW: 14.2 % (ref 11.5–15.5)
WBC: 11.3 10*3/uL — ABNORMAL HIGH (ref 4.0–10.5)

## 2022-04-22 LAB — SEDIMENTATION RATE: Sed Rate: 34 mm/hr — ABNORMAL HIGH (ref 0–30)

## 2022-04-22 LAB — C-REACTIVE PROTEIN: CRP: <1 mg/dL (ref 0.5–20.0)

## 2022-04-24 ENCOUNTER — Encounter: Payer: Self-pay | Admitting: Internal Medicine

## 2022-04-24 NOTE — Assessment & Plan Note (Signed)
Is s/p sinus surgery - fungus ball.  With increased headache - last several days.  Recently treated for sinus infection.  Off abx.  Saline nasal spray as directed.  Stop flonase in case aggravating her headache.  Robitussin as directed.  Check ESR and CRP.  Discussed if persistent despite above and pending lab results, may need f/u head scan/MRI.

## 2022-04-24 NOTE — Assessment & Plan Note (Signed)
No acid reflux on current regimen.  Continue nexium.  Follow.

## 2022-04-24 NOTE — Assessment & Plan Note (Signed)
On lisinopril. Blood pressure as outlined.  Follow metabolic panel.

## 2022-04-24 NOTE — Assessment & Plan Note (Signed)
Just treated with omnicef.  Persistent headache.  Discussed possible etiologies.  Hold on further abx.  Continue humidifier.  Robitussin - to thin mucus.  Stop flonase in case aggravating headache.  Saline nasal spray.  Call with update.

## 2022-04-24 NOTE — Assessment & Plan Note (Signed)
Crestor.  Low cholesterol diet and exercise.  Follow lipid panel and liver function tests.   Lab Results  Component Value Date   CHOL 151 03/31/2022   HDL 61.20 03/31/2022   LDLCALC 63 03/31/2022   LDLDIRECT 187.0 03/26/2021   TRIG 135.0 03/31/2022   CHOLHDL 2 03/31/2022

## 2022-04-24 NOTE — Assessment & Plan Note (Signed)
Follow cbc.  EGD 05/20/13 - mild chronic gastritis.  No repeat EGD recommended at that time.  Colonoscopy 05/20/13 - diverticulosis.   f/u colonoscopy in 10 years.

## 2022-04-24 NOTE — Assessment & Plan Note (Signed)
Continue risk factor modification.  Crestor.

## 2022-04-24 NOTE — Assessment & Plan Note (Signed)
Breathing stable.

## 2022-04-24 NOTE — Assessment & Plan Note (Signed)
Last a1c increased - 7.2.  Continues low carb diet and exercise.  Continue trulicity, farxiga and metformin.  Changed to XR metformin last visit. Follow sugars.  Follow met b and A1c.

## 2022-04-25 ENCOUNTER — Encounter: Payer: Self-pay | Admitting: Internal Medicine

## 2022-04-25 ENCOUNTER — Other Ambulatory Visit: Payer: Self-pay | Admitting: Internal Medicine

## 2022-04-25 DIAGNOSIS — R519 Headache, unspecified: Secondary | ICD-10-CM

## 2022-04-25 NOTE — Progress Notes (Signed)
Order placed for MRI

## 2022-04-26 ENCOUNTER — Telehealth: Payer: Self-pay | Admitting: Internal Medicine

## 2022-04-26 ENCOUNTER — Other Ambulatory Visit: Payer: Self-pay

## 2022-04-26 MED ORDER — INTEGRA 62.5-62.5-40-3 MG PO CAPS: 1.0000 | ORAL_CAPSULE | Freq: Two times a day (BID) | ORAL | 1 refills | Status: DC

## 2022-04-26 MED ORDER — LORAZEPAM 0.5 MG PO TABS: ORAL_TABLET | ORAL | 0 refills | Status: DC

## 2022-04-26 NOTE — Telephone Encounter (Signed)
Lft pt vm to call ofc to sch MRI. thanks

## 2022-04-26 NOTE — Telephone Encounter (Signed)
I received a message from [place] that when Evelyn James was notified about being scheduled for MRI, she requested to have something to help her stay calm during the scan.  I sent in rx for lorazepam with instructions to take 1/2 tablet prior to procedure.  May repeat x 1 if needed.  Instruct her to have someone drive her home.

## 2022-04-27 NOTE — Telephone Encounter (Signed)
Pt called back and I read the message to her and she understood

## 2022-04-27 NOTE — Telephone Encounter (Signed)
LMTCB

## 2022-04-29 ENCOUNTER — Ambulatory Visit: Payer: BC Managed Care – PPO | Admitting: Internal Medicine

## 2022-05-02 ENCOUNTER — Ambulatory Visit
Admission: RE | Admit: 2022-05-02 | Discharge: 2022-05-02 | Disposition: A | Discharge status: Home / Self Care | Payer: BC Managed Care – PPO | Source: Ambulatory Visit | Attending: Internal Medicine | Admitting: Internal Medicine

## 2022-05-02 DIAGNOSIS — R519 Headache, unspecified: Secondary | ICD-10-CM | POA: Diagnosis not present

## 2022-05-02 MED ORDER — GADOBUTROL 1 MMOL/ML IV SOLN
7.5000 mL | Freq: Once | INTRAVENOUS | Status: AC | PRN
  Administered 2022-05-02: 7.5 mL via INTRAVENOUS

## 2022-06-17 ENCOUNTER — Encounter: Payer: Self-pay | Admitting: Internal Medicine

## 2022-06-20 ENCOUNTER — Other Ambulatory Visit: Payer: Self-pay

## 2022-06-20 MED ORDER — ONETOUCH ULTRA VI STRP: ORAL_STRIP | 2 refills | Status: DC

## 2022-07-05 ENCOUNTER — Ambulatory Visit
Admission: RE | Admit: 2022-07-05 | Discharge: 2022-07-05 | Disposition: A | Discharge status: Home / Self Care | Payer: BC Managed Care – PPO | Source: Ambulatory Visit | Attending: Internal Medicine | Admitting: Internal Medicine

## 2022-07-05 DIAGNOSIS — Z1231 Encounter for screening mammogram for malignant neoplasm of breast: Secondary | ICD-10-CM

## 2022-07-06 LAB — HM DIABETES EYE EXAM

## 2022-08-04 ENCOUNTER — Other Ambulatory Visit (INDEPENDENT_AMBULATORY_CARE_PROVIDER_SITE_OTHER): Payer: BC Managed Care – PPO

## 2022-08-04 ENCOUNTER — Other Ambulatory Visit (HOSPITAL_COMMUNITY): Payer: Self-pay

## 2022-08-04 DIAGNOSIS — E1159 Type 2 diabetes mellitus with other circulatory complications: Secondary | ICD-10-CM | POA: Diagnosis not present

## 2022-08-04 DIAGNOSIS — E7800 Pure hypercholesterolemia, unspecified: Secondary | ICD-10-CM

## 2022-08-04 DIAGNOSIS — I10 Essential (primary) hypertension: Secondary | ICD-10-CM

## 2022-08-04 DIAGNOSIS — Z7984 Long term (current) use of oral hypoglycemic drugs: Secondary | ICD-10-CM

## 2022-08-04 LAB — LIPID PANEL
Cholesterol: 132 mg/dL (ref 0–200)
HDL: 54.9 mg/dL (ref >39.00)
LDL Cholesterol: 45 mg/dL (ref 0–99)
NonHDL: 76.69
Total CHOL/HDL Ratio: 2
Triglycerides: 158 mg/dL — ABNORMAL HIGH (ref 0.0–149.0)
VLDL: 31.6 mg/dL (ref 0.0–40.0)

## 2022-08-04 LAB — HEPATIC FUNCTION PANEL
ALT: 12 U/L (ref 0–35)
AST: 11 U/L (ref 0–37)
Albumin: 4.2 g/dL (ref 3.5–5.2)
Alkaline Phosphatase: 49 U/L (ref 39–117)
Bilirubin, Direct: 0 mg/dL (ref 0.0–0.3)
Total Bilirubin: 0.3 mg/dL (ref 0.2–1.2)
Total Protein: 6.6 g/dL (ref 6.0–8.3)

## 2022-08-04 LAB — HEMOGLOBIN A1C: Hgb A1c MFr Bld: 6.7 % — ABNORMAL HIGH (ref 4.6–6.5)

## 2022-08-04 LAB — BASIC METABOLIC PANEL
BUN: 10 mg/dL (ref 6–23)
CO2: 32 mEq/L (ref 19–32)
Calcium: 9.4 mg/dL (ref 8.4–10.5)
Chloride: 103 mEq/L (ref 96–112)
Creatinine, Ser: 0.61 mg/dL (ref 0.40–1.20)
GFR: 95.14 mL/min (ref >60.00)
Glucose, Bld: 114 mg/dL — ABNORMAL HIGH (ref 70–99)
Potassium: 3.8 mEq/L (ref 3.5–5.1)
Sodium: 142 mEq/L (ref 135–145)

## 2022-08-05 ENCOUNTER — Other Ambulatory Visit (HOSPITAL_COMMUNITY): Payer: Self-pay

## 2022-08-05 ENCOUNTER — Other Ambulatory Visit: Payer: Self-pay

## 2022-08-09 ENCOUNTER — Ambulatory Visit: Payer: BC Managed Care – PPO | Admitting: Internal Medicine

## 2022-08-09 VITALS — BP 124/70 | HR 80 | Temp 98.3°F | Resp 16 | Ht 63.0 in | Wt 188.2 lb

## 2022-08-09 DIAGNOSIS — E7800 Pure hypercholesterolemia, unspecified: Secondary | ICD-10-CM

## 2022-08-09 DIAGNOSIS — G4452 New daily persistent headache (NDPH): Secondary | ICD-10-CM

## 2022-08-09 DIAGNOSIS — D649 Anemia, unspecified: Secondary | ICD-10-CM

## 2022-08-09 DIAGNOSIS — Z7984 Long term (current) use of oral hypoglycemic drugs: Secondary | ICD-10-CM

## 2022-08-09 DIAGNOSIS — J4530 Mild persistent asthma, uncomplicated: Secondary | ICD-10-CM

## 2022-08-09 DIAGNOSIS — E1159 Type 2 diabetes mellitus with other circulatory complications: Secondary | ICD-10-CM

## 2022-08-09 DIAGNOSIS — Z9109 Other allergy status, other than to drugs and biological substances: Secondary | ICD-10-CM

## 2022-08-09 DIAGNOSIS — I25118 Atherosclerotic heart disease of native coronary artery with other forms of angina pectoris: Secondary | ICD-10-CM

## 2022-08-09 DIAGNOSIS — I10 Essential (primary) hypertension: Secondary | ICD-10-CM

## 2022-08-09 DIAGNOSIS — K219 Gastro-esophageal reflux disease without esophagitis: Secondary | ICD-10-CM

## 2022-08-09 MED ORDER — ESCITALOPRAM OXALATE 20 MG PO TABS: ORAL_TABLET | ORAL | 1 refills | Status: DC

## 2022-08-09 MED ORDER — CYCLOBENZAPRINE HCL 10 MG PO TABS: ORAL_TABLET | ORAL | 0 refills | Status: DC

## 2022-08-09 MED ORDER — DAPAGLIFLOZIN PROPANEDIOL 10 MG PO TABS: ORAL_TABLET | ORAL | 1 refills | Status: DC

## 2022-08-09 MED ORDER — ROSUVASTATIN CALCIUM 20 MG PO TABS: ORAL_TABLET | ORAL | 1 refills | Status: DC

## 2022-08-09 MED ORDER — LISINOPRIL 20 MG PO TABS: ORAL_TABLET | ORAL | 1 refills | Status: DC

## 2022-08-09 MED ORDER — MAGNESIUM OXIDE 400 MG PO CAPS: ORAL_CAPSULE | ORAL | 3 refills | Status: DC

## 2022-08-09 MED ORDER — INTEGRA 62.5-62.5-40-3 MG PO CAPS: 1.0000 | ORAL_CAPSULE | Freq: Two times a day (BID) | ORAL | 1 refills | Status: DC

## 2022-08-09 NOTE — Assessment & Plan Note (Signed)
Continues low carb diet and exercise.  Continue trulicity, farxiga and metformin.  Changed to XR metformin previous visit.  Recent A1c improved - 6.7. Follow sugars.  Follow met b and A1c.

## 2022-08-09 NOTE — Progress Notes (Signed)
Subjective:    Patient ID: Evelyn James, female    DOB: 1960/03/16, 61 y.o.   MRN: [id number]  Patient here for  Chief Complaint  Patient presents with   Medical Management of Chronic Issues    HPI Here to follow up regarding hypercholesterolemia, diabetes and hypertension.  Recently evaluated for persistent headache.  MRI (05/02/22) - unremarkable.  No evidence of acute abnormality.  Still with persistent headache.  Is better, but has been constant.  Taking mucinex two per day.  Taking antihistamine and singulair.  Saw ortho Dedra Skeens) 05/18/22 - left knee pain - cortisone injection.  Reevalauted 07/13/22 - right knee pain - s/p injection.  Referred for bilateral versus left knee geniculate nerve block with Dr Yves Dill.  Her insurance would not cover.  Planning for another injection.  No chest pain or sob reported.  No cough or congestion.  No abdominal pain or bowel change reported.     Past Medical History:  Diagnosis Date   Anemia    Noted 11/2011   Anxiety    Chest pain    Admitted 11/2011: cath with mild nonobstructive coronary plaque, normal EF, negative cardiac enzymes and negative d-dimer.   Diabetes mellitus    Fatty liver disease, nonalcoholic 2014   GERD (gastroesophageal reflux disease)    History of COVID-19 06/17/2019   Hypercholesterolemia    Hypertension    Reflux    TMJ dysfunction    Past Surgical History:  Procedure Laterality Date   ABDOMINAL HYSTERECTOMY  2009   total   BILATERAL SALPINGOOPHORECTOMY  2009   benign ovarian tumor   HERNIA REPAIR     umbilical   IMAGE GUIDED SINUS SURGERY N/A 10/22/2015   Procedure: IMAGE GUIDED SINUS SURGERY;  Surgeon: Bud Face, MD;  Location: [facility] ORS;  Service: ENT;  Laterality: N/A;   KNEE SURGERY Bilateral    LEFT HEART CATHETERIZATION WITH CORONARY ANGIOGRAM N/A 12/09/2011   Procedure: LEFT HEART CATHETERIZATION WITH CORONARY ANGIOGRAM;  Surgeon: Rollene Rotunda, MD;  Location: [facility];  Service:  Cardiovascular;  Laterality: N/A;   SPHENOIDECTOMY Left 10/22/2015   Procedure: SPHENOIDECTOMY;  Surgeon: Bud Face, MD;  Location: [facility] OR[facility];  Service: ENT;  Laterality: Left;   SUBMANDIBULAR MASS EXCISION  1996   [facility]    Family History  Problem Relation Age of Onset   Heart disease Father        Father had CABG in his 39s   Colon polyps Father    Lung cancer Mother 82   Breast cancer Paternal Grandmother 33   Arthritis/Rheumatoid Paternal Grandfather    Breast cancer Paternal Aunt 59   Social History   Socioeconomic History   Marital status: Married    Spouse name: Not on file   Number of children: 2   Years of education: Not on file   Highest education level: 12th grade  Occupational History   Not on file  Tobacco Use   Smoking status: Never   Smokeless tobacco: Never  Vaping Use   Vaping Use: Never used  Substance and Sexual Activity   Alcohol use: No    Alcohol/week: 0.0 standard drinks of alcohol   Drug use: No   Sexual activity: Not on file  Other Topics Concern   Not on file  Social History Narrative   Not on file   Social Determinants of Health   Financial Resource Strain: Low Risk  (08/05/2022)   Overall Financial Resource Strain (CARDIA)    Difficulty of  Paying Living Expenses: Not hard at all  Food Insecurity: No Food Insecurity (08/05/2022)   Hunger Vital Sign    Worried About Running Out of Food in the Last Year: Never true    Ran Out of Food in the Last Year: Never true  Transportation Needs: No Transportation Needs (08/05/2022)   PRAPARE - Administrator, Civil Service (Medical): No    Lack of Transportation (Non-Medical): No  Physical Activity: Unknown (08/05/2022)   Exercise Vital Sign    Days of Exercise per Week: 0 days    Minutes of Exercise per Session: Not on file  Stress: No Stress Concern Present (08/05/2022)   [employer] of Occupational Health - Occupational Stress Questionnaire    Feeling of Stress : Not at all   Social Connections: Socially Integrated (08/05/2022)   Social Connection and Isolation Panel [NHANES]    Frequency of Communication with Friends and Family: More than three times a week    Frequency of Social Gatherings with Friends and Family: More than three times a week    Attends Religious Services: More than 4 times per year    Active Member of [employer] or Organizations: Yes    Attends Engineer, structural: More than 4 times per year    Marital Status: Married     Review of Systems  Constitutional:  Negative for appetite change and unexpected weight change.  HENT:  Negative for congestion and sinus pressure.   Respiratory:  Negative for cough, chest tightness and shortness of breath.   Cardiovascular:  Negative for chest pain, palpitations and leg swelling.  Gastrointestinal:  Negative for abdominal pain, diarrhea, nausea and vomiting.  Genitourinary:  Negative for difficulty urinating and dysuria.  Musculoskeletal:  Negative for myalgias.       Knee pain as outlined.   Skin:  Negative for color change and rash.  Neurological:  Positive for headaches. Negative for dizziness.  Psychiatric/Behavioral:  Negative for agitation and dysphoric mood.        Objective:     BP 124/70   Pulse 80   Temp 98.3 F (36.8 C)   Resp 16   Ht 5\' 3"  (1.6 m)   Wt 188 lb 3.2 oz (85.4 kg)   SpO2 98%   BMI 33.34 kg/m  Wt Readings from Last 3 Encounters:  08/09/22 188 lb 3.2 oz (85.4 kg)  04/21/22 194 lb 12.8 oz (88.4 kg)  04/05/22 194 lb 12.8 oz (88.4 kg)    Physical Exam Vitals reviewed.  Constitutional:      General: She is not in acute distress.    Appearance: Normal appearance.  HENT:     Head: Normocephalic and atraumatic.     Right Ear: External ear normal.     Left Ear: External ear normal.  Eyes:     General: No scleral icterus.       Right eye: No discharge.        Left eye: No discharge.     Conjunctiva/sclera: Conjunctivae normal.  Neck:     Thyroid: No  thyromegaly.  Cardiovascular:     Rate and Rhythm: Normal rate and regular rhythm.  Pulmonary:     Effort: No respiratory distress.     Breath sounds: Normal breath sounds. No wheezing.  Abdominal:     General: Bowel sounds are normal.     Palpations: Abdomen is soft.     Tenderness: There is no abdominal tenderness.  Musculoskeletal:  General: No swelling or tenderness.     Cervical back: Neck supple. No tenderness.  Lymphadenopathy:     Cervical: No cervical adenopathy.  Skin:    Findings: No erythema or rash.  Neurological:     Mental Status: She is alert.  Psychiatric:        Mood and Affect: Mood normal.        Behavior: Behavior normal.      Outpatient Encounter Medications as of 08/09/2022  Medication Sig   Magnesium Oxide 400 MG CAPS Take one tablet q day   acetaminophen (TYLENOL) 500 MG tablet Take 1,000 mg by mouth every 6 (six) hours as needed for mild pain.   acyclovir ointment (ZOVIRAX) 5 % Apply 1 Application topically every 6 (six) hours.   albuterol (VENTOLIN HFA) 108 (90 Base) MCG/ACT inhaler Inhale 2 puffs into the lungs every 6 (six) hours as needed for wheezing or shortness of breath.   Blood Glucose Monitoring Suppl (ONE TOUCH ULTRA 2) w/Device KIT U D TO CHECK BLOOD SUGARS   cetirizine (ZYRTEC) 10 MG tablet Take 10 mg by mouth daily.   clotrimazole (MYCELEX) 10 MG troche Take 1 tablet (10 mg total) by mouth 3 (three) times daily.   cyclobenzaprine (FLEXERIL) 10 MG tablet 1/2 tablet q hs prn (do not take without wearing cpap)   dapagliflozin propanediol (FARXIGA) 10 MG TABS tablet TAKE 1 TABLET(10 MG) BY MOUTH EVERY MORNING   Dulaglutide (TRULICITY) 3 MG/0.5ML SOPN Inject 3 mg into the skin every 7 (seven) days as directed.   escitalopram (LEXAPRO) 20 MG tablet TAKE 1 TABLET(20 MG) BY MOUTH DAILY   esomeprazole (NEXIUM) 20 MG capsule Take 20 mg by mouth daily at 12 noon.   famotidine (PEPCID) 40 MG tablet Take 40 mg by mouth 2 (two) times daily.   Fe  Fum-FePoly-Vit C-Vit B3 (INTEGRA) 62.5-62.5-40-3 MG CAPS Take 1 capsule by mouth 2 (two) times daily.   fluconazole (DIFLUCAN) 150 MG tablet Take one tablet by mouth for a total of one dose. If symptoms persist may repeat in 3 days.   fluticasone (FLONASE) 50 MCG/ACT nasal spray Place 2 sprays into both nostrils daily.   gabapentin (NEURONTIN) 100 MG capsule Take 100 mg by mouth daily.   glucose blood (ONETOUCH ULTRA) test strip USE AS DIRECTED TO CHECK BLOOD SUGAR TWICE DAILY   levocetirizine (XYZAL) 5 MG tablet SMARTSIG:1 Tablet(s) By Mouth Every Evening   lisinopril (ZESTRIL) 20 MG tablet TAKE 1 TABLET(20 MG) BY MOUTH DAILY   LORazepam (ATIVAN) 0.5 MG tablet Take 1/2 tablet x 1 prior to procedure. May repeat 1/2 tablet x 1 if needed.   Melatonin 10 MG TABS Take 10 mg by mouth at bedtime as needed (sleep).   meloxicam (MOBIC) 15 MG tablet Take 15 mg by mouth daily.   metFORMIN (GLUCOPHAGE-XR) 500 MG 24 hr tablet Take two tablets bid   mometasone (ELOCON) 0.1 % cream Apply topically 2 (two) times daily.   montelukast (SINGULAIR) 10 MG tablet Take 1 tablet (10 mg total) by mouth at bedtime.   nystatin cream (MYCOSTATIN) Apply 1 application topically 2 (two) times daily.   Probiotic Product (PHILLIPS COLON HEALTH PO) Take 1 tablet by mouth daily as needed (colon health).    rosuvastatin (CRESTOR) 20 MG tablet TAKE 1 TABLET(20 MG) BY MOUTH DAILY   valACYclovir (VALTREX) 1000 MG tablet Take 1 tablet (1,000 mg total) by mouth 2 (two) times daily.   [DISCONTINUED] cyclobenzaprine (FLEXERIL) 10 MG tablet 1/2 tablet q hs  prn (do not take without wearing cpap)   [DISCONTINUED] escitalopram (LEXAPRO) 20 MG tablet TAKE 1 TABLET(20 MG) BY MOUTH DAILY   [DISCONTINUED] FARXIGA 10 MG TABS tablet TAKE 1 TABLET(10 MG) BY MOUTH EVERY MORNING   [DISCONTINUED] Fe Fum-FePoly-Vit C-Vit B3 (INTEGRA) 62.5-62.5-40-3 MG CAPS Take 1 capsule by mouth 2 (two) times daily.   [DISCONTINUED] lisinopril (ZESTRIL) 20 MG tablet  TAKE 1 TABLET(20 MG) BY MOUTH DAILY   [DISCONTINUED] rosuvastatin (CRESTOR) 20 MG tablet TAKE 1 TABLET(20 MG) BY MOUTH DAILY   No facility-administered encounter medications on file as of 08/09/2022.     Lab Results  Component Value Date   WBC 11.3 (H) 04/21/2022   HGB 13.3 04/21/2022   HCT 41.0 04/21/2022   PLT 268.0 04/21/2022   GLUCOSE 114 (H) 08/04/2022   CHOL 132 08/04/2022   TRIG 158.0 (H) 08/04/2022   HDL 54.90 08/04/2022   LDLDIRECT 187.0 03/26/2021   LDLCALC 45 08/04/2022   ALT 12 08/04/2022   AST 11 08/04/2022   NA 142 08/04/2022   K 3.8 08/04/2022   CL 103 08/04/2022   CREATININE 0.61 08/04/2022   BUN 10 08/04/2022   CO2 32 08/04/2022   TSH 3.01 11/24/2021   HGBA1C 6.7 (H) 08/04/2022   MICROALBUR 0.8 11/24/2021    MM 3D SCREEN BREAST BILATERAL  Result Date: 07/07/2022 CLINICAL DATA:  Screening. EXAM: DIGITAL SCREENING BILATERAL MAMMOGRAM WITH TOMOSYNTHESIS AND CAD TECHNIQUE: Bilateral screening digital craniocaudal and mediolateral oblique mammograms were obtained. Bilateral screening digital breast tomosynthesis was performed. The images were evaluated with computer-aided detection. COMPARISON:  Previous exam(s). ACR Breast Density Category b: There are scattered areas of fibroglandular density. FINDINGS: There are no findings suspicious for malignancy. IMPRESSION: No mammographic evidence of malignancy. A result letter of this screening mammogram will be mailed directly to the patient. RECOMMENDATION: Screening mammogram in one year. (Code:SM-B-01Y) BI-RADS CATEGORY  1: Negative. Electronically Signed   By: Jacob Moores M.D.   On: 07/07/2022 12:35       Assessment & Plan:  Type 2 diabetes mellitus with other circulatory complication, without long-term current use of insulin (HCC) Assessment & Plan: Continues low carb diet and exercise.  Continue trulicity, farxiga and metformin.  Changed to XR metformin previous visit.  Recent A1c improved - 6.7. Follow sugars.   Follow met b and A1c.   Orders: -     Hemoglobin A1c; Future -     Microalbumin / creatinine urine ratio; Future  Hypercholesterolemia Assessment & Plan: Crestor.  Low cholesterol diet and exercise.  Follow lipid panel and liver function tests.   Lab Results  Component Value Date   CHOL 132 08/04/2022   HDL 54.90 08/04/2022   LDLCALC 45 08/04/2022   LDLDIRECT 187.0 03/26/2021   TRIG 158.0 (H) 08/04/2022   CHOLHDL 2 08/04/2022    Orders: -     Basic metabolic panel; Future -     Hepatic function panel; Future -     Lipid panel; Future -     TSH; Future  Anemia, unspecified type Assessment & Plan: Follow cbc.  EGD 05/20/13 - mild chronic gastritis.  No repeat EGD recommended at that time.  Colonoscopy 05/20/13 - diverticulosis.   f/u colonoscopy in 10 years.    Orders: -     CBC with Differential/Platelet; Future -     Ferritin; Future  Mild persistent asthma, unspecified whether complicated Assessment & Plan: Breathing stable.    Coronary artery disease involving native coronary artery of native heart  with other form of angina pectoris [facility]) Assessment & Plan: Continue risk factor modification.  Crestor.    Environmental allergies Assessment & Plan: Taking mucinex two per day.  Taking antihistamine and singulair.    Gastroesophageal reflux disease, unspecified whether esophagitis present Assessment & Plan: No acid reflux on current regimen.  Continue nexium.  Follow.   New daily persistent headache Assessment & Plan: Is s/p sinus surgery - fungus ball - previously. Persistent headache.  Recently treated for sinus infection. Unremarkable MRI brain.  Headache is better.  Still present.  Continue antihistamine and singulair.  Start mag oxide.  Consider neurology evaluation and w/up.  Will notify me if desires.  Follow.     Primary hypertension Assessment & Plan: On lisinopril. Blood pressure as outlined.  Follow metabolic panel.    Other orders -      Cyclobenzaprine HCl; 1/2 tablet q hs prn (do not take without wearing cpap)  Dispense: 20 tablet; Refill: 0 -     Escitalopram Oxalate; TAKE 1 TABLET(20 MG) BY MOUTH DAILY  Dispense: 90 tablet; Refill: 1 -     Dapagliflozin Propanediol; TAKE 1 TABLET(10 MG) BY MOUTH EVERY MORNING  Dispense: 90 tablet; Refill: 1 -     Integra; Take 1 capsule by mouth 2 (two) times daily.  Dispense: 180 capsule; Refill: 1 -     Lisinopril; TAKE 1 TABLET(20 MG) BY MOUTH DAILY  Dispense: 90 tablet; Refill: 1 -     Rosuvastatin Calcium; TAKE 1 TABLET(20 MG) BY MOUTH DAILY  Dispense: 90 tablet; Refill: 1 -     Magnesium Oxide; Take one tablet q day  Dispense: 30 capsule; Refill: 3     Dale Hobbs, MD

## 2022-08-14 ENCOUNTER — Encounter: Payer: Self-pay | Admitting: Internal Medicine

## 2022-08-14 NOTE — Assessment & Plan Note (Signed)
Breathing stable.

## 2022-08-14 NOTE — Assessment & Plan Note (Signed)
Follow cbc.  EGD 05/20/13 - mild chronic gastritis.  No repeat EGD recommended at that time.  Colonoscopy 05/20/13 - diverticulosis.   f/u colonoscopy in 10 years.   

## 2022-08-14 NOTE — Assessment & Plan Note (Signed)
Crestor.  Low cholesterol diet and exercise.  Follow lipid panel and liver function tests.   Lab Results  Component Value Date   CHOL 132 08/04/2022   HDL 54.90 08/04/2022   LDLCALC 45 08/04/2022   LDLDIRECT 187.0 03/26/2021   TRIG 158.0 (H) 08/04/2022   CHOLHDL 2 08/04/2022

## 2022-08-14 NOTE — Assessment & Plan Note (Signed)
Continue risk factor modification.  Crestor.  ?

## 2022-08-14 NOTE — Assessment & Plan Note (Signed)
On lisinopril.  Blood pressure as outlined.  Follow metabolic panel.   

## 2022-08-14 NOTE — Assessment & Plan Note (Signed)
Taking mucinex two per day.  Taking antihistamine and singulair.

## 2022-08-14 NOTE — Assessment & Plan Note (Signed)
No acid reflux on current regimen.  Continue nexium.  Follow. 

## 2022-08-14 NOTE — Assessment & Plan Note (Signed)
Is s/p sinus surgery - fungus ball - previously. Persistent headache.  Recently treated for sinus infection. Unremarkable MRI brain.  Headache is better.  Still present.  Continue antihistamine and singulair.  Start mag oxide.  Consider neurology evaluation and w/up.  Will notify me if desires.  Follow.

## 2022-08-31 ENCOUNTER — Other Ambulatory Visit: Payer: Self-pay

## 2022-09-01 ENCOUNTER — Encounter: Payer: Self-pay | Admitting: Internal Medicine

## 2022-09-01 DIAGNOSIS — R519 Headache, unspecified: Secondary | ICD-10-CM

## 2022-09-02 NOTE — Telephone Encounter (Signed)
Order placed for neurology referral.   

## 2022-09-05 ENCOUNTER — Encounter: Payer: Self-pay | Admitting: Neurology

## 2022-09-05 ENCOUNTER — Other Ambulatory Visit: Payer: Self-pay

## 2022-09-07 ENCOUNTER — Other Ambulatory Visit: Payer: Self-pay

## 2022-09-26 ENCOUNTER — Encounter: Payer: Self-pay | Admitting: Internal Medicine

## 2022-09-27 ENCOUNTER — Other Ambulatory Visit: Payer: Self-pay

## 2022-09-27 MED ORDER — METFORMIN HCL ER 500 MG PO TB24: ORAL_TABLET | ORAL | 5 refills | Status: DC

## 2022-09-28 ENCOUNTER — Other Ambulatory Visit (HOSPITAL_BASED_OUTPATIENT_CLINIC_OR_DEPARTMENT_OTHER): Payer: Self-pay

## 2022-09-28 ENCOUNTER — Other Ambulatory Visit (HOSPITAL_COMMUNITY): Payer: Self-pay

## 2022-09-28 ENCOUNTER — Other Ambulatory Visit: Payer: Self-pay

## 2022-10-25 ENCOUNTER — Other Ambulatory Visit (HOSPITAL_COMMUNITY): Payer: Self-pay

## 2022-10-27 ENCOUNTER — Encounter (INDEPENDENT_AMBULATORY_CARE_PROVIDER_SITE_OTHER): Payer: Self-pay

## 2022-11-22 ENCOUNTER — Other Ambulatory Visit (HOSPITAL_COMMUNITY): Payer: Self-pay

## 2022-12-06 ENCOUNTER — Encounter: Payer: Self-pay | Admitting: Internal Medicine

## 2022-12-06 DIAGNOSIS — M25562 Pain in left knee: Secondary | ICD-10-CM | POA: Insufficient documentation

## 2022-12-07 ENCOUNTER — Other Ambulatory Visit: Payer: BC Managed Care – PPO

## 2022-12-07 DIAGNOSIS — E7800 Pure hypercholesterolemia, unspecified: Secondary | ICD-10-CM | POA: Diagnosis not present

## 2022-12-07 DIAGNOSIS — E1159 Type 2 diabetes mellitus with other circulatory complications: Secondary | ICD-10-CM

## 2022-12-07 DIAGNOSIS — D649 Anemia, unspecified: Secondary | ICD-10-CM

## 2022-12-07 LAB — CBC WITH DIFFERENTIAL/PLATELET
Basophils Absolute: 0 10*3/uL (ref 0.0–0.1)
Basophils Relative: 0.4 % (ref 0.0–3.0)
Eosinophils Absolute: 0.2 10*3/uL (ref 0.0–0.7)
Eosinophils Relative: 1.5 % (ref 0.0–5.0)
HCT: 39.4 % (ref 36.0–46.0)
Hemoglobin: 12.5 g/dL (ref 12.0–15.0)
Lymphocytes Relative: 30 % (ref 12.0–46.0)
Lymphs Abs: 3 10*3/uL (ref 0.7–4.0)
MCHC: 31.6 g/dL (ref 30.0–36.0)
MCV: 89.8 fl (ref 78.0–100.0)
Monocytes Absolute: 0.6 10*3/uL (ref 0.1–1.0)
Monocytes Relative: 6.2 % (ref 3.0–12.0)
Neutro Abs: 6.2 10*3/uL (ref 1.4–7.7)
Neutrophils Relative %: 61.9 % (ref 43.0–77.0)
Platelets: 237 10*3/uL (ref 150.0–400.0)
RBC: 4.38 Mil/uL (ref 3.87–5.11)
RDW: 14.2 % (ref 11.5–15.5)
WBC: 10 10*3/uL (ref 4.0–10.5)

## 2022-12-07 LAB — LIPID PANEL
Cholesterol: 152 mg/dL (ref 0–200)
HDL: 63.2 mg/dL (ref >39.00)
LDL Cholesterol: 66 mg/dL (ref 0–99)
NonHDL: 88.79
Total CHOL/HDL Ratio: 2
Triglycerides: 116 mg/dL (ref 0.0–149.0)
VLDL: 23.2 mg/dL (ref 0.0–40.0)

## 2022-12-07 LAB — HEPATIC FUNCTION PANEL
ALT: 11 U/L (ref 0–35)
AST: 11 U/L (ref 0–37)
Albumin: 4.3 g/dL (ref 3.5–5.2)
Alkaline Phosphatase: 59 U/L (ref 39–117)
Bilirubin, Direct: 0 mg/dL (ref 0.0–0.3)
Total Bilirubin: 0.3 mg/dL (ref 0.2–1.2)
Total Protein: 6.9 g/dL (ref 6.0–8.3)

## 2022-12-07 LAB — BASIC METABOLIC PANEL
BUN: 16 mg/dL (ref 6–23)
CO2: 30 mEq/L (ref 19–32)
Calcium: 9.7 mg/dL (ref 8.4–10.5)
Chloride: 102 mEq/L (ref 96–112)
Creatinine, Ser: 0.73 mg/dL (ref 0.40–1.20)
GFR: 87.31 mL/min (ref >60.00)
Glucose, Bld: 94 mg/dL (ref 70–99)
Potassium: 4.7 mEq/L (ref 3.5–5.1)
Sodium: 140 mEq/L (ref 135–145)

## 2022-12-07 LAB — HEMOGLOBIN A1C: Hgb A1c MFr Bld: 6.6 % — ABNORMAL HIGH (ref 4.6–6.5)

## 2022-12-07 LAB — TSH: TSH: 1.89 u[IU]/mL (ref 0.35–5.50)

## 2022-12-07 LAB — MICROALBUMIN / CREATININE URINE RATIO
Creatinine,U: 77.8 mg/dL
Microalb Creat Ratio: 4.2 mg/g (ref 0.0–30.0)
Microalb, Ur: 3.2 mg/dL — ABNORMAL HIGH (ref 0.0–1.9)

## 2022-12-07 LAB — FERRITIN: Ferritin: 62 ng/mL (ref 10.0–291.0)

## 2022-12-08 ENCOUNTER — Encounter: Payer: Self-pay | Admitting: *Deleted

## 2022-12-08 ENCOUNTER — Telehealth: Payer: Self-pay | Admitting: *Deleted

## 2022-12-08 NOTE — Telephone Encounter (Signed)
Patient states she is returning our call.  I read message from Dr. Dale Bunnell to patient.

## 2022-12-08 NOTE — Telephone Encounter (Signed)
-----   Message from Dowell sent at 12/08/2022  6:36 AM EDT ----- Please call and notify - overall sugar control has improved.  A1c 6.6. cholesterol levels look good. Hgb, white blood cell count, thyroid test,kidney function tests and liver function tests are wnl.

## 2022-12-08 NOTE — Telephone Encounter (Signed)
NOTED

## 2022-12-08 NOTE — Telephone Encounter (Signed)
Left voicemail to return call or check mychart

## 2022-12-12 ENCOUNTER — Encounter: Payer: BC Managed Care – PPO | Admitting: Internal Medicine

## 2022-12-19 ENCOUNTER — Other Ambulatory Visit: Payer: Self-pay | Admitting: Internal Medicine

## 2022-12-19 DIAGNOSIS — E1159 Type 2 diabetes mellitus with other circulatory complications: Secondary | ICD-10-CM

## 2022-12-21 ENCOUNTER — Encounter: Payer: Self-pay | Admitting: Internal Medicine

## 2022-12-22 ENCOUNTER — Other Ambulatory Visit: Payer: Self-pay

## 2022-12-22 MED ORDER — TRULICITY 3 MG/0.5ML ~~LOC~~ SOAJ
3.0000 mg | SUBCUTANEOUS | 1 refills | Status: DC
  Filled 2022-12-22: qty 2, 28d supply, fill #0
  Filled 2023-01-14 – 2023-01-21 (×2): qty 2, 28d supply, fill #1
  Filled 2023-02-13 – 2023-02-15 (×2): qty 2, 28d supply, fill #2
  Filled 2023-03-16: qty 2, 28d supply, fill #3
  Filled 2023-04-12 (×2): qty 2, 28d supply, fill #4

## 2022-12-29 ENCOUNTER — Encounter: Payer: Self-pay | Admitting: Internal Medicine

## 2022-12-29 ENCOUNTER — Telehealth: Payer: Self-pay

## 2022-12-29 ENCOUNTER — Ambulatory Visit: Payer: BC Managed Care – PPO | Admitting: Internal Medicine

## 2022-12-29 VITALS — BP 112/70 | HR 88 | Temp 98.2°F | Resp 16 | Ht 63.0 in | Wt 179.0 lb

## 2022-12-29 DIAGNOSIS — S51851A Open bite of right forearm, initial encounter: Secondary | ICD-10-CM

## 2022-12-29 DIAGNOSIS — J4530 Mild persistent asthma, uncomplicated: Secondary | ICD-10-CM

## 2022-12-29 DIAGNOSIS — M25562 Pain in left knee: Secondary | ICD-10-CM | POA: Diagnosis not present

## 2022-12-29 DIAGNOSIS — I10 Essential (primary) hypertension: Secondary | ICD-10-CM

## 2022-12-29 DIAGNOSIS — Z0000 Encounter for general adult medical examination without abnormal findings: Secondary | ICD-10-CM | POA: Diagnosis not present

## 2022-12-29 DIAGNOSIS — Z23 Encounter for immunization: Secondary | ICD-10-CM | POA: Diagnosis not present

## 2022-12-29 DIAGNOSIS — W5501XA Bitten by cat, initial encounter: Secondary | ICD-10-CM

## 2022-12-29 DIAGNOSIS — K219 Gastro-esophageal reflux disease without esophagitis: Secondary | ICD-10-CM

## 2022-12-29 DIAGNOSIS — E7800 Pure hypercholesterolemia, unspecified: Secondary | ICD-10-CM

## 2022-12-29 DIAGNOSIS — T148XXA Other injury of unspecified body region, initial encounter: Secondary | ICD-10-CM

## 2022-12-29 DIAGNOSIS — I25118 Atherosclerotic heart disease of native coronary artery with other forms of angina pectoris: Secondary | ICD-10-CM

## 2022-12-29 DIAGNOSIS — E1159 Type 2 diabetes mellitus with other circulatory complications: Secondary | ICD-10-CM

## 2022-12-29 DIAGNOSIS — D649 Anemia, unspecified: Secondary | ICD-10-CM

## 2022-12-29 MED ORDER — AMOXICILLIN-POT CLAVULANATE 875-125 MG PO TABS: 1.0000 | ORAL_TABLET | Freq: Two times a day (BID) | ORAL | 0 refills | Status: DC

## 2022-12-29 NOTE — Telephone Encounter (Signed)
Patient seen in office and reported that she had been bitten by cat.  Feral cat that they feed regularly lives at a family member's house . Called and spoke with pt asked her not to go to [facility] for any rabies vaccinations until I can speak with [facility] and know that is what she needs to do. Spoke with [facility] Department to ask for recommendations.  She reported that I needed to reach out to [employer] for testing of the cat.  Phone call to [employer], relayed known information concerning situation and contact information for patient.  Animal Control will reach out to pt for location of cat and further steps.  Phone call to pt she is aware that [employer] will be reaching out for further steps.

## 2022-12-29 NOTE — Progress Notes (Signed)
Subjective:    Patient ID: Evelyn James, female    DOB: 1960/04/29, 62 y.o.   MRN: [id number]  Patient here for  Chief Complaint  Patient presents with   Annual Exam    HPI Here for a physical exam. Has seen ortho for her knee pain.  Bone on bone. S/p cortisone injection.  Taking naprosyn prn. Discussed - gym/pool.  Continues f/u with ortho.  Tries to stay active.  No chest pain or sob reported.  No cough or congestion.  No abdominal pain or bowel change.  She was bit by a feral cat 12/29/22.  She knows the cat and the cat visits a family member - who feeds the cat.  She is not aware that anyone has ever taken the cat for vaccinations.  She does not believe the cat is up to date with vaccines.     Past Medical History:  Diagnosis Date   Anemia    Noted 11/2011   Anxiety    Chest pain    Admitted 11/2011: cath with mild nonobstructive coronary plaque, normal EF, negative cardiac enzymes and negative d-dimer.   Diabetes mellitus    Fatty liver disease, nonalcoholic 2014   GERD (gastroesophageal reflux disease)    History of COVID-19 06/17/2019   Hypercholesterolemia    Hypertension    Reflux    TMJ dysfunction    Past Surgical History:  Procedure Laterality Date   ABDOMINAL HYSTERECTOMY  2009   total   BILATERAL SALPINGOOPHORECTOMY  2009   benign ovarian tumor   HERNIA REPAIR     umbilical   IMAGE GUIDED SINUS SURGERY N/A 10/22/2015   Procedure: IMAGE GUIDED SINUS SURGERY;  Surgeon: Bud Face, MD;  Location: [facility] ORS;  Service: ENT;  Laterality: N/A;   KNEE SURGERY Bilateral    LEFT HEART CATHETERIZATION WITH CORONARY ANGIOGRAM N/A 12/09/2011   Procedure: LEFT HEART CATHETERIZATION WITH CORONARY ANGIOGRAM;  Surgeon: Rollene Rotunda, MD;  Location: [facility] CATH LAB;  Service: Cardiovascular;  Laterality: N/A;   SPHENOIDECTOMY Left 10/22/2015   Procedure: SPHENOIDECTOMY;  Surgeon: Bud Face, MD;  Location: [facility] ORS;  Service: ENT;  Laterality: Left;   SUBMANDIBULAR MASS  EXCISION  1996   [facility]    Family History  Problem Relation Age of Onset   Heart disease Father        Father had CABG in his 67s   Colon polyps Father    Lung cancer Mother 60   Breast cancer Paternal Grandmother 14   Arthritis/Rheumatoid Paternal Grandfather    Breast cancer Paternal Aunt 28   Social History   Socioeconomic History   Marital status: Married    Spouse name: Not on file   Number of children: 2   Years of education: Not on file   Highest education level: 12th grade  Occupational History   Not on file  Tobacco Use   Smoking status: Never   Smokeless tobacco: Never  Vaping Use   Vaping status: Never Used  Substance and Sexual Activity   Alcohol use: No    Alcohol/week: 0.0 standard drinks of alcohol   Drug use: No   Sexual activity: Not on file  Other Topics Concern   Not on file  Social History Narrative   Not on file   Social Determinants of Health   Financial Resource Strain: Low Risk  (12/28/2022)   Overall Financial Resource Strain (CARDIA)    Difficulty of Paying Living Expenses: Not hard at all  Food Insecurity: No  Food Insecurity (12/28/2022)   Hunger Vital Sign    Worried About Running Out of Food in the Last Year: Never true    Ran Out of Food in the Last Year: Never true  Transportation Needs: No Transportation Needs (12/28/2022)   PRAPARE - Administrator, Civil Service (Medical): No    Lack of Transportation (Non-Medical): No  Physical Activity: Sufficiently Active (12/28/2022)   Exercise Vital Sign    Days of Exercise per Week: 5 days    Minutes of Exercise per Session: 30 min  Stress: No Stress Concern Present (12/28/2022)   [employer] of Occupational Health - Occupational Stress Questionnaire    Feeling of Stress : Not at all  Social Connections: Socially Integrated (12/28/2022)   Social Connection and Isolation Panel [NHANES]    Frequency of Communication with Friends and Family: Twice a week    Frequency of  Social Gatherings with Friends and Family: More than three times a week    Attends Religious Services: More than 4 times per year    Active Member of [employer] or Organizations: Yes    Attends Engineer, structural: More than 4 times per year    Marital Status: Married     Review of Systems  Constitutional:  Negative for appetite change and unexpected weight change.  HENT:  Negative for congestion, sinus pressure and sore throat.   Eyes:  Negative for pain and visual disturbance.  Respiratory:  Negative for cough, chest tightness and shortness of breath.   Cardiovascular:  Negative for chest pain and palpitations.  Gastrointestinal:  Negative for abdominal pain, diarrhea, nausea and vomiting.  Genitourinary:  Negative for difficulty urinating and dysuria.  Musculoskeletal:  Negative for joint swelling and myalgias.  Skin:  Negative for color change and rash.       Bite - right arm.    Neurological:  Negative for dizziness and headaches.  Hematological:  Negative for adenopathy. Does not bruise/bleed easily.  Psychiatric/Behavioral:  Negative for agitation and dysphoric mood.        Objective:     BP 112/70   Pulse 88   Temp 98.2 F (36.8 C)   Resp 16   Ht 5\' 3"  (1.6 m)   Wt 179 lb (81.2 kg)   SpO2 98%   BMI 31.71 kg/m  Wt Readings from Last 3 Encounters:  12/31/22 180 lb 3.2 oz (81.7 kg)  12/29/22 179 lb (81.2 kg)  08/09/22 188 lb 3.2 oz (85.4 kg)    Physical Exam Vitals reviewed.  Constitutional:      General: She is not in acute distress.    Appearance: Normal appearance. She is well-developed.  HENT:     Head: Normocephalic and atraumatic.     Right Ear: External ear normal.     Left Ear: External ear normal.  Eyes:     General: No scleral icterus.       Right eye: No discharge.        Left eye: No discharge.     Conjunctiva/sclera: Conjunctivae normal.  Neck:     Thyroid: No thyromegaly.  Cardiovascular:     Rate and Rhythm: Normal rate and regular  rhythm.  Pulmonary:     Effort: No tachypnea, accessory muscle usage or respiratory distress.     Breath sounds: Normal breath sounds. No decreased breath sounds or wheezing.  Chest:  Breasts:    Right: No inverted nipple, mass, nipple discharge or tenderness (no axillary adenopathy).  Left: No inverted nipple, mass, nipple discharge or tenderness (no axilarry adenopathy).  Abdominal:     General: Bowel sounds are normal.     Palpations: Abdomen is soft.     Tenderness: There is no abdominal tenderness.  Musculoskeletal:        General: No swelling or tenderness.     Cervical back: Neck supple.  Lymphadenopathy:     Cervical: No cervical adenopathy.  Skin:    Findings: No rash.     Comments: Right forearm bite - minimal surrounding erythema.    Neurological:     Mental Status: She is alert and oriented to person, place, and time.  Psychiatric:        Mood and Affect: Mood normal.        Behavior: Behavior normal.      Outpatient Encounter Medications as of 12/29/2022  Medication Sig   amoxicillin-clavulanate (AUGMENTIN) 875-125 MG tablet Take 1 tablet by mouth 2 (two) times daily.   acetaminophen (TYLENOL) 500 MG tablet Take 1,000 mg by mouth every 6 (six) hours as needed for mild pain.   acyclovir ointment (ZOVIRAX) 5 % Apply 1 Application topically every 6 (six) hours.   albuterol (VENTOLIN HFA) 108 (90 Base) MCG/ACT inhaler Inhale 2 puffs into the lungs every 6 (six) hours as needed for wheezing or shortness of breath.   Blood Glucose Monitoring Suppl (ONE TOUCH ULTRA 2) w/Device KIT U D TO CHECK BLOOD SUGARS   cetirizine (ZYRTEC) 10 MG tablet Take 10 mg by mouth daily.   clotrimazole (MYCELEX) 10 MG troche Take 1 tablet (10 mg total) by mouth 3 (three) times daily.   cyclobenzaprine (FLEXERIL) 10 MG tablet 1/2 tablet q hs prn (do not take without wearing cpap)   dapagliflozin propanediol (FARXIGA) 10 MG TABS tablet TAKE 1 TABLET(10 MG) BY MOUTH EVERY MORNING    Dulaglutide (TRULICITY) 3 MG/0.5ML SOPN Inject 3 mg into the skin every 7 (seven) days as directed.   escitalopram (LEXAPRO) 20 MG tablet TAKE 1 TABLET(20 MG) BY MOUTH DAILY   esomeprazole (NEXIUM) 20 MG capsule Take 20 mg by mouth daily at 12 noon.   famotidine (PEPCID) 40 MG tablet Take 40 mg by mouth 2 (two) times daily.   Fe Fum-FePoly-Vit C-Vit B3 (INTEGRA) 62.5-62.5-40-3 MG CAPS Take 1 capsule by mouth 2 (two) times daily.   fluconazole (DIFLUCAN) 150 MG tablet Take one tablet by mouth for a total of one dose. If symptoms persist may repeat in 3 days.   fluticasone (FLONASE) 50 MCG/ACT nasal spray Place 2 sprays into both nostrils daily.   gabapentin (NEURONTIN) 100 MG capsule Take 100 mg by mouth daily.   glucose blood (ONETOUCH ULTRA) test strip USE AS DIRECTED TO CHECK BLOOD SUGAR TWICE DAILY   levocetirizine (XYZAL) 5 MG tablet SMARTSIG:1 Tablet(s) By Mouth Every Evening   lisinopril (ZESTRIL) 20 MG tablet TAKE 1 TABLET(20 MG) BY MOUTH DAILY   LORazepam (ATIVAN) 0.5 MG tablet Take 1/2 tablet x 1 prior to procedure. May repeat 1/2 tablet x 1 if needed.   Magnesium Oxide 400 MG CAPS Take one tablet q day   Melatonin 10 MG TABS Take 10 mg by mouth at bedtime as needed (sleep).   meloxicam (MOBIC) 15 MG tablet Take 15 mg by mouth daily.   metFORMIN (GLUCOPHAGE-XR) 500 MG 24 hr tablet Take two tablets bid   mometasone (ELOCON) 0.1 % cream Apply topically 2 (two) times daily.   montelukast (SINGULAIR) 10 MG tablet Take 1 tablet (10  mg total) by mouth at bedtime.   nystatin cream (MYCOSTATIN) Apply 1 application topically 2 (two) times daily.   Probiotic Product (PHILLIPS COLON HEALTH PO) Take 1 tablet by mouth daily as needed (colon health).    rosuvastatin (CRESTOR) 20 MG tablet TAKE 1 TABLET(20 MG) BY MOUTH DAILY   valACYclovir (VALTREX) 1000 MG tablet Take 1 tablet (1,000 mg total) by mouth 2 (two) times daily.   No facility-administered encounter medications on file as of 12/29/2022.      Lab Results  Component Value Date   WBC 10.0 12/07/2022   HGB 12.5 12/07/2022   HCT 39.4 12/07/2022   PLT 237.0 12/07/2022   GLUCOSE 94 12/07/2022   CHOL 152 12/07/2022   TRIG 116.0 12/07/2022   HDL 63.20 12/07/2022   LDLDIRECT 187.0 03/26/2021   LDLCALC 66 12/07/2022   ALT 11 12/07/2022   AST 11 12/07/2022   NA 140 12/07/2022   K 4.7 12/07/2022   CL 102 12/07/2022   CREATININE 0.73 12/07/2022   BUN 16 12/07/2022   CO2 30 12/07/2022   TSH 1.89 12/07/2022   HGBA1C 6.6 (H) 12/07/2022   MICROALBUR 3.2 (H) 12/07/2022    MM 3D SCREEN BREAST BILATERAL  Result Date: 07/07/2022 CLINICAL DATA:  Screening. EXAM: DIGITAL SCREENING BILATERAL MAMMOGRAM WITH TOMOSYNTHESIS AND CAD TECHNIQUE: Bilateral screening digital craniocaudal and mediolateral oblique mammograms were obtained. Bilateral screening digital breast tomosynthesis was performed. The images were evaluated with computer-aided detection. COMPARISON:  Previous exam(s). ACR Breast Density Category b: There are scattered areas of fibroglandular density. FINDINGS: There are no findings suspicious for malignancy. IMPRESSION: No mammographic evidence of malignancy. A result letter of this screening mammogram will be mailed directly to the patient. RECOMMENDATION: Screening mammogram in one year. (Code:SM-B-01Y) BI-RADS CATEGORY  1: Negative. Electronically Signed   By: Jacob Moores M.D.   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 Recommend cortisone injection q 3 months.  Naproxen.    Primary hypertension Assessment & Plan: On lisinopril. Blood pressure as outlined.  Follow metabolic panel.    Hypercholesterolemia Assessment & Plan: Crestor.  Low  cholesterol diet and exercise.  Follow lipid panel and liver function tests.   Lab Results  Component Value Date   CHOL 152 12/07/2022   HDL 63.20 12/07/2022   LDLCALC 66 12/07/2022   LDLDIRECT 187.0 03/26/2021   TRIG 116.0 12/07/2022   CHOLHDL 2 12/07/2022     Healthcare maintenance Assessment & Plan: Physical today 12/29/22.  Colonoscopy 05/2018 - recommended f/u colonoscopy in 5 years.  Mammogram 07/05/22 - Birads I.  PAP 12/03/21 - negative with negative HPV (atrophy)   Gastroesophageal reflux disease, unspecified whether esophagitis present Assessment & Plan: No acid reflux on current regimen.  Continue nexium.  Follow.   Type 2 diabetes mellitus with other circulatory complication, without long-term current use of insulin (HCC) Assessment & Plan: Continues low carb diet and exercise.  Continue trulicity, farxiga and metformin.  Changed to XR metformin previous visit. Follow sugars.  Follow met b and A1c.  Lab Results  Component Value Date   HGBA1C 6.6 (H) 12/07/2022      Coronary artery disease involving native coronary artery of native  heart with other form of angina pectoris [facility]) Assessment & Plan: Continue risk factor modification.  Crestor.    Mild persistent asthma, unspecified whether complicated Assessment & Plan: Breathing stable.    Anemia, unspecified type Assessment & Plan: Follow cbc.  EGD 05/20/13 - mild chronic gastritis.  No repeat EGD recommended at that time.  Colonoscopy as outlined.     Cat bite of right forearm, initial encounter Assessment & Plan: Feral cat.  Her and family member know the can and feed the cat, but she is unsure if the cat is up to date with vaccines.  Bite cleaned and covered.  Tetanus booster given.  Treat with augmentin.  Health dept contacted.  Recommended calling animal control.  Animal control was contacted and per discussion, will reach out to her for location of cat and further recommendations.     Other orders -      Amoxicillin-Pot Clavulanate; Take 1 tablet by mouth 2 (two) times daily.  Dispense: 14 tablet; Refill: 0     Dale [place], MD

## 2022-12-29 NOTE — Telephone Encounter (Signed)
LM for Evelyn James at [facility] Dept to return my call regarding animal bite.

## 2022-12-30 ENCOUNTER — Ambulatory Visit
Admission: EM | Admit: 2022-12-30 | Discharge: 2022-12-30 | Discharge status: Against Medical Advice | Payer: BC Managed Care – PPO

## 2022-12-30 NOTE — Telephone Encounter (Signed)
Patient just called and animal control told her they won't be reaching out to her. She just would like to know what she needs to do next. Her number is [phone].

## 2022-12-31 ENCOUNTER — Other Ambulatory Visit: Payer: Self-pay | Admitting: Internal Medicine

## 2022-12-31 ENCOUNTER — Ambulatory Visit
Admission: RE | Admit: 2022-12-31 | Discharge: 2022-12-31 | Disposition: A | Discharge status: Home / Self Care | Payer: BC Managed Care – PPO | Source: Ambulatory Visit

## 2022-12-31 VITALS — BP 111/73 | HR 100 | Temp 97.8°F | Resp 18 | Wt 180.2 lb

## 2022-12-31 DIAGNOSIS — Z2914 Encounter for prophylactic rabies immune globin: Secondary | ICD-10-CM

## 2022-12-31 DIAGNOSIS — Z203 Contact with and (suspected) exposure to rabies: Secondary | ICD-10-CM | POA: Diagnosis not present

## 2022-12-31 DIAGNOSIS — W5501XA Bitten by cat, initial encounter: Secondary | ICD-10-CM | POA: Diagnosis not present

## 2022-12-31 DIAGNOSIS — S51811A Laceration without foreign body of right forearm, initial encounter: Secondary | ICD-10-CM

## 2022-12-31 MED ORDER — RABIES VACCINE, PCEC IM SUSR
1.0000 mL | Freq: Once | INTRAMUSCULAR | Status: AC
  Administered 2022-12-31: 1 mL via INTRAMUSCULAR

## 2022-12-31 MED ORDER — RABIES IMMUNE GLOBULIN 150 UNIT/ML IM INJ
20.0000 [IU]/kg | INJECTION | Freq: Once | INTRAMUSCULAR | Status: AC
  Administered 2022-12-31: 1650 [IU]

## 2022-12-31 NOTE — Discharge Instructions (Addendum)
Meds ordered this encounter  Medications   rabies immune globulin (HYPERRAB/KEDRAB) injection 1,650 Units   rabies vaccine (RABAVERT) injection 1 mL    In addition to the human rabies immune globulin (HRIG), you were given the first dose of the rabies vaccine today (Day 0).  Please return here on the following dates to complete the rabies series: Day 3: 01/03/2023 Day 7: 01/07/2023 Day 14: 01/14/2023   Continue the antibiotic that was prescribed by your primary care to prevent infection.  Monitor this area for any signs of infection including redness, bleeding, drainage.  If you have any additional symptoms such as fever, nausea, vomiting you should be seen immediately.  Return as outlined above for your additional rabies vaccinations.  If anything changes please return for reevaluation.

## 2022-12-31 NOTE — ED Provider Notes (Signed)
Renaldo Fiddler    CSN: [id number] Arrival date & time: 12/31/22  1158      History   Chief Complaint Chief Complaint  Patient presents with   Animal Bite    HPI Evelyn James is a 62 y.o. female.   Patient presents today for initiation of rabies postexposure prophylaxis.  Reports that she was bitten by a feral cat in the neighborhood on 12/29/2022.  She has been in contact with animal control but they were unable to catch the cat.  While many people in the area are familiar with the cat, nobody takes it to the vet and so she does not believe it is up-to-date on vaccinations including rabies.  She was seen by her primary care who started her on Augmentin and updated her tetanus.  Unfortunately, they did not have the ability to start her rabies postexposure prophylaxis and so she was referred to our clinic.  She has never had rabies vaccines in the past.  Denies any fever, nausea, vomiting.  She is right-handed.    Past Medical History:  Diagnosis Date   Anemia    Noted 11/2011   Anxiety    Chest pain    Admitted 11/2011: cath with mild nonobstructive coronary plaque, normal EF, negative cardiac enzymes and negative d-dimer.   Diabetes mellitus    Fatty liver disease, nonalcoholic 2014   GERD (gastroesophageal reflux disease)    History of COVID-19 06/17/2019   Hypercholesterolemia    Hypertension    Reflux    TMJ dysfunction     Patient Active Problem List   Diagnosis Date Noted   Left knee pain 12/06/2022   Congestion of nasal sinus 04/10/2022   Fall 12/11/2021   Acute cystitis without hematuria 09/07/2021   Vaginal itching 09/07/2021   Idiopathic urticaria 04/04/2021   Sciatica 12/05/2020   Soreness of tongue 08/30/2020   Neck pain 06/29/2020   Asthma 12/16/2019   GERD (gastroesophageal reflux disease) 06/23/2019   Abnormal mammogram 11/18/2018   Vaginal lesion 04/30/2018   Cold sore 04/30/2018   Constipation 03/28/2018   Sinusitis 11/25/2017   Headache  07/09/2015   Healthcare maintenance 07/06/2014   Dysuria 02/05/2014   Obesity (BMI 30-39.9) 01/01/2014   IBS (irritable bowel syndrome) 09/30/2013   Environmental allergies 02/26/2013   Coronary artery disease involving native coronary artery of native heart with other form of angina pectoris (HCC) 03/05/2012   Sleep apnea 03/05/2012   Hypertension 02/27/2012   Anemia 02/27/2012   Hypercholesterolemia 02/27/2012   Diabetes mellitus with circulatory complication (HCC) 02/27/2012    Past Surgical History:  Procedure Laterality Date   ABDOMINAL HYSTERECTOMY  2009   total   BILATERAL SALPINGOOPHORECTOMY  2009   benign ovarian tumor   HERNIA REPAIR     umbilical   IMAGE GUIDED SINUS SURGERY N/A 10/22/2015   Procedure: IMAGE GUIDED SINUS SURGERY;  Surgeon: Bud Face, MD;  Location: [facility] ORS;  Service: ENT;  Laterality: N/A;   KNEE SURGERY Bilateral    LEFT HEART CATHETERIZATION WITH CORONARY ANGIOGRAM N/A 12/09/2011   Procedure: LEFT HEART CATHETERIZATION WITH CORONARY ANGIOGRAM;  Surgeon: Rollene Rotunda, MD;  Location: [facility] CATH LAB;  Service: Cardiovascular;  Laterality: N/A;   SPHENOIDECTOMY Left 10/22/2015   Procedure: SPHENOIDECTOMY;  Surgeon: Bud Face, MD;  Location: [facility] ORS;  Service: ENT;  Laterality: Left;   SUBMANDIBULAR MASS EXCISION  1996   [facility]     OB History     Gravida  2   Para  2   Term      Preterm      AB      Living  2      SAB      IAB      Ectopic      Multiple      Live Births           Obstetric Comments  Menstrual age: 14  Age 1st Pregnancy: 47           Home Medications    Prior to Admission medications   Medication Sig Start Date End Date Taking? Authorizing Provider  naproxen (NAPROSYN) 500 MG tablet Take 500 mg by mouth 2 (two) times daily. 12/27/22  Yes [provider]  acetaminophen (TYLENOL) 500 MG tablet Take 1,000 mg by mouth every 6 (six) hours as needed for mild pain.    [provider]  acyclovir ointment (ZOVIRAX) 5 % Apply 1 Application topically every 6 (six) hours. 04/05/22   Dale [place], MD  albuterol (VENTOLIN HFA) 108 (90 Base) MCG/ACT inhaler Inhale 2 puffs into the lungs every 6 (six) hours as needed for wheezing or shortness of breath. 12/15/20   Coralyn Helling, MD  amoxicillin-clavulanate (AUGMENTIN) 875-125 MG tablet Take 1 tablet by mouth 2 (two) times daily. 12/29/22   Dale [place], MD  Blood Glucose Monitoring Suppl (ONE TOUCH ULTRA 2) w/Device KIT U D TO CHECK BLOOD SUGARS 07/21/17   [provider]  cetirizine (ZYRTEC) 10 MG tablet Take 10 mg by mouth daily.    [provider]  clotrimazole (MYCELEX) 10 MG troche Take 1 tablet (10 mg total) by mouth 3 (three) times daily. 08/24/20   Dale [place], MD  cyclobenzaprine (FLEXERIL) 10 MG tablet 1/2 tablet q hs prn (do not take without wearing cpap) 08/09/22   Dale [place], MD  dapagliflozin propanediol (FARXIGA) 10 MG TABS tablet TAKE 1 TABLET(10 MG) BY MOUTH EVERY MORNING 08/09/22   Dale [place], MD  Dulaglutide (TRULICITY) 3 MG/0.5ML SOPN Inject 3 mg into the skin every 7 (seven) days as directed. 12/22/22   Dale [place], MD  escitalopram (LEXAPRO) 20 MG tablet TAKE 1 TABLET(20 MG) BY MOUTH DAILY 08/09/22   Dale [place], MD  esomeprazole (NEXIUM) 20 MG capsule Take 20 mg by mouth daily at 12 noon.    [provider]  famotidine (PEPCID) 40 MG tablet Take 40 mg by mouth 2 (two) times daily. 05/06/21   [provider]  Fe Fum-FePoly-Vit C-Vit B3 (INTEGRA) 62.5-62.5-40-3 MG CAPS Take 1 capsule by mouth 2 (two) times daily. 08/09/22   Dale [place], MD  fluconazole (DIFLUCAN) 150 MG tablet Take one tablet by mouth for a total of one dose. If symptoms persist may repeat in 3 days. 10/06/21   Dale [place], MD  fluticasone (FLONASE) 50 MCG/ACT nasal spray Place 2 sprays into both nostrils daily. 06/30/14   Dale [place], MD  gabapentin (NEURONTIN) 100 MG  capsule Take 100 mg by mouth daily. 03/11/21   [provider]  glucose blood (ONETOUCH ULTRA) test strip USE AS DIRECTED TO CHECK BLOOD SUGAR TWICE DAILY 06/20/22   Dale [place], MD  levocetirizine (XYZAL) 5 MG tablet SMARTSIG:1 Tablet(s) By Mouth Every Evening 05/06/21   [provider]  lisinopril (ZESTRIL) 20 MG tablet TAKE 1 TABLET(20 MG) BY MOUTH DAILY 08/09/22   Dale , MD  LORazepam (ATIVAN) 0.5 MG tablet Take 1/2 tablet x 1 prior to procedure. May repeat 1/2 tablet x 1 if needed. 04/26/22  Dale [place], MD  Magnesium Oxide 400 MG CAPS Take one tablet q day 08/09/22   Dale [place], MD  Melatonin 10 MG TABS Take 10 mg by mouth at bedtime as needed (sleep).    [provider]  meloxicam (MOBIC) 15 MG tablet Take 15 mg by mouth daily. 09/03/17   [provider]  metFORMIN (GLUCOPHAGE-XR) 500 MG 24 hr tablet Take two tablets bid 09/27/22   Dale [place], MD  mometasone (ELOCON) 0.1 % cream Apply topically 2 (two) times daily. 05/06/21   [provider]  montelukast (SINGULAIR) 10 MG tablet Take 1 tablet (10 mg total) by mouth at bedtime. 07/08/21   Coralyn Helling, MD  nystatin cream (MYCOSTATIN) Apply 1 application topically 2 (two) times daily. 06/23/20   Dale [place], MD  Probiotic Product (PHILLIPS COLON HEALTH PO) Take 1 tablet by mouth daily as needed (colon health).     [provider]  rosuvastatin (CRESTOR) 20 MG tablet TAKE 1 TABLET(20 MG) BY MOUTH DAILY 08/09/22   Dale [place], MD  valACYclovir (VALTREX) 1000 MG tablet Take 1 tablet (1,000 mg total) by mouth 2 (two) times daily. 07/10/18   Tracey Harries, FNP    Family History Family History  Problem Relation Age of Onset   Heart disease Father        Father had CABG in his 108s   Colon polyps Father    Lung cancer Mother 54   Breast cancer Paternal Grandmother 49   Arthritis/Rheumatoid Paternal Grandfather    Breast cancer Paternal Aunt 57    Social  History Social History   Tobacco Use   Smoking status: Never   Smokeless tobacco: Never  Vaping Use   Vaping status: Never Used  Substance Use Topics   Alcohol use: No    Alcohol/week: 0.0 standard drinks of alcohol   Drug use: No     Allergies   Sulfa antibiotics   Review of Systems Review of Systems  Constitutional:  Negative for activity change, appetite change, fatigue and fever.  Gastrointestinal:  Negative for abdominal pain, diarrhea, nausea and vomiting.  Musculoskeletal:  Negative for arthralgias and myalgias.  Skin:  Positive for wound.  Neurological:  Negative for weakness and numbness.     Physical Exam Triage Vital Signs ED Triage Vitals  Encounter Vitals Group     BP 12/31/22 1226 111/73     Systolic BP Percentile --      Diastolic BP Percentile --      Pulse Rate 12/31/22 1226 100     Resp 12/31/22 1226 18     Temp 12/31/22 1226 97.8 F (36.6 C)     Temp src --      SpO2 12/31/22 1226 97 %     Weight 12/31/22 1226 180 lb 3.2 oz (81.7 kg)     Height --      Head Circumference --      Peak Flow --      Pain Score 12/31/22 1214 0     Pain Loc --      Pain Education --      Exclude from Growth Chart --    No data found.  Updated Vital Signs BP 111/73   Pulse 100   Temp 97.8 F (36.6 C)   Resp 18   Wt 180 lb 3.2 oz (81.7 kg)   SpO2 97%   BMI 31.92 kg/m   Visual Acuity Right Eye Distance:   Left Eye Distance:   Bilateral Distance:  Right Eye Near:   Left Eye Near:    Bilateral Near:     Physical Exam Vitals reviewed.  Constitutional:      General: She is awake. She is not in acute distress.    Appearance: Normal appearance. She is well-developed. She is not ill-appearing.     Comments: Very pleasant female appears stated age in no acute distress sitting comfortably in exam room  HENT:     Head: Normocephalic and atraumatic.  Cardiovascular:     Rate and Rhythm: Normal rate and regular rhythm.     Heart sounds: Normal heart  sounds, S1 normal and S2 normal. No murmur heard. Pulmonary:     Effort: Pulmonary effort is normal.     Breath sounds: Normal breath sounds. No wheezing, rhonchi or rales.     Comments: Clear to auscultation bilaterally Skin:    Findings: Laceration present.          Comments: Approximately 3 cm laceration noted right dorsal forearm.  No active bleeding or drainage noted.  No streaking or evidence of lymphangitis.  Psychiatric:        Behavior: Behavior is cooperative.      UC Treatments / Results  Labs (all labs ordered are listed, but only abnormal results are displayed) Labs Reviewed - No data to display  EKG   Radiology No results found.  Procedures Procedures (including critical care time)  Medications Ordered in UC Medications  rabies immune globulin (HYPERRAB/KEDRAB) injection 1,650 Units (1,650 Units Infiltration Given 12/31/22 1239)  rabies vaccine (RABAVERT) injection 1 mL (1 mL Intramuscular Given 12/31/22 1237)    Initial Impression / Assessment and Plan / UC Course  I have reviewed the triage vital signs and the nursing notes.  Pertinent labs & imaging results that were available during my care of the patient were reviewed by me and considered in my medical decision making (see chart for details).     Patient is well-appearing, afebrile, nontoxic, nontachycardic.  She has already been started on antibiotics and so was encouraged to complete course of medication as previously prescribed.  Tetanus has also been updated by her primary care.  Rabies immunoglobulin was given today by nursing staff and rabies vaccination series was started.  Discussed the importance of returning to complete course of vaccination series.  Discussed that if anything changes or worsen she should return for reevaluation.  All questions were answered patient satisfaction.  Final Clinical Impressions(s) / UC Diagnoses   Final diagnoses:  Forearm laceration, right, initial encounter   Encounter for prophylactic administration of rabies immune globulin  Cat bite, initial encounter     Discharge Instructions       Meds ordered this encounter  Medications   rabies immune globulin (HYPERRAB/KEDRAB) injection 1,650 Units   rabies vaccine (RABAVERT) injection 1 mL    In addition to the human rabies immune globulin (HRIG), you were given the first dose of the rabies vaccine today (Day 0).  Please return here on the following dates to complete the rabies series: Day 3: 01/03/2023 Day 7: 01/07/2023 Day 14: 01/14/2023   Continue the antibiotic that was prescribed by your primary care to prevent infection.  Monitor this area for any signs of infection including redness, bleeding, drainage.  If you have any additional symptoms such as fever, nausea, vomiting you should be seen immediately.  Return as outlined above for your additional rabies vaccinations.  If anything changes please return for reevaluation.     ED Prescriptions  None    PDMP not reviewed this encounter.   Jeani Hawking, PA-C 12/31/22 1303

## 2022-12-31 NOTE — ED Triage Notes (Signed)
Patient to Urgent Care with complaints of a feral cat bite that occurred to right forearm. Band-Aid in place. Incident occurred 12/29/22 morning.  Patient already in contact with animal control and the health department. Bite reported.   TDAP 12/29/2022.

## 2022-12-31 NOTE — ED Notes (Signed)
Appointments scheduled for patient to return for follow-up vaccinations. Tolerated initial vaccines well. No other complaints. Reviewed d/c instructions with patient.

## 2023-01-01 ENCOUNTER — Encounter: Payer: Self-pay | Admitting: Internal Medicine

## 2023-01-01 DIAGNOSIS — W5501XA Bitten by cat, initial encounter: Secondary | ICD-10-CM | POA: Insufficient documentation

## 2023-01-01 DIAGNOSIS — S51851A Open bite of right forearm, initial encounter: Secondary | ICD-10-CM | POA: Insufficient documentation

## 2023-01-01 NOTE — Assessment & Plan Note (Signed)
Crestor.  Low cholesterol diet and exercise.  Follow lipid panel and liver function tests.   Lab Results  Component Value Date   CHOL 152 12/07/2022   HDL 63.20 12/07/2022   LDLCALC 66 12/07/2022   LDLDIRECT 187.0 03/26/2021   TRIG 116.0 12/07/2022   CHOLHDL 2 12/07/2022

## 2023-01-01 NOTE — Assessment & Plan Note (Signed)
Continue risk factor modification.  Crestor.  

## 2023-01-01 NOTE — Assessment & Plan Note (Signed)
Feral cat.  Her and family member know the can and feed the cat, but she is unsure if the cat is up to date with vaccines.  Bite cleaned and covered.  Tetanus booster given.  Treat with augmentin.  Health dept contacted.  Recommended calling animal control.  Animal control was contacted and per discussion, will reach out to her for location of cat and further recommendations.

## 2023-01-01 NOTE — Assessment & Plan Note (Signed)
Continues low carb diet and exercise.  Continue trulicity, farxiga and metformin.  Changed to XR metformin previous visit. Follow sugars.  Follow met b and A1c.  Lab Results  Component Value Date   HGBA1C 6.6 (H) 12/07/2022

## 2023-01-01 NOTE — Assessment & Plan Note (Signed)
Breathing stable.

## 2023-01-01 NOTE — Assessment & Plan Note (Signed)
No acid reflux on current regimen.  Continue nexium.  Follow.

## 2023-01-01 NOTE — Assessment & Plan Note (Addendum)
Follow cbc.  EGD 05/20/13 - mild chronic gastritis.  No repeat EGD recommended at that time.  Colonoscopy as outlined.

## 2023-01-01 NOTE — Assessment & Plan Note (Signed)
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 Recommend cortisone injection q 3 months.  Naproxen.

## 2023-01-01 NOTE — Assessment & Plan Note (Signed)
On lisinopril.  Blood pressure as outlined.  Follow metabolic panel.   

## 2023-01-01 NOTE — Assessment & Plan Note (Signed)
Physical today 12/29/22.  Colonoscopy 05/2018 - recommended f/u colonoscopy in 5 years.  Mammogram 07/05/22 - Birads I.  PAP 12/03/21 - negative with negative HPV (atrophy)

## 2023-01-02 NOTE — Plan of Care (Signed)
[facility] Tonsillectomy/Adenoidectomy, Postoperative PEDS care plan entered in error.

## 2023-01-03 ENCOUNTER — Ambulatory Visit
Admission: EM | Admit: 2023-01-03 | Discharge: 2023-01-03 | Disposition: A | Discharge status: Home / Self Care | Payer: BC Managed Care – PPO | Attending: Emergency Medicine | Admitting: Emergency Medicine

## 2023-01-03 DIAGNOSIS — Z203 Contact with and (suspected) exposure to rabies: Secondary | ICD-10-CM | POA: Insufficient documentation

## 2023-01-03 DIAGNOSIS — Z23 Encounter for immunization: Secondary | ICD-10-CM | POA: Diagnosis not present

## 2023-01-03 MED ORDER — RABIES VACCINE, PCEC IM SUSR
1.0000 mL | Freq: Once | INTRAMUSCULAR | Status: AC
  Administered 2023-01-03: 1 mL via INTRAMUSCULAR

## 2023-01-03 NOTE — Telephone Encounter (Signed)
Called patient. She said she never heard from anyone from health dept and animal control did not pick up the cat so she started the rabies vaccines and has done well with them

## 2023-01-03 NOTE — ED Notes (Signed)
Follow-up appointment reviewed with patient. Verbalizes understanding.

## 2023-01-03 NOTE — Progress Notes (Unsigned)
NEUROLOGY CONSULTATION NOTE  [name] MRN: [id number] DOB: 1960-09-29  Referring provider: Dale [place], MD Primary care provider: Dale Vincent, MD  Reason for consult:  headache  Assessment/Plan:   Migraine without aura, without status migrainosus, not intractable.  She had episode of status migrainosus back in February but that was an isolated event.  Currently doing well. She may not be having headaches due to use of naproxen  At this time, she will follow up as needed.  If headaches return, she will contact me.  We can start a preventative and have her follow up. Limit use of pain relievers to no more than 2 days out of week to prevent risk of rebound or medication-overuse headache. Keep headache diary Lifestyle modification:  continue exercise, increase water intake, modify diet, caffeine cessation, proper sleep hygiene   Subjective:  Evelyn James is a 62 year old right-handed female with HTN, DM 2, non-alcoholic fatty liver disease, asthma, hypercholesterolemia, anxiety, TMJ dysfunction and reflux who presents for headaches.  History supplemented by referring provider's notes.  She has had history of intermittent headaches for many years.  They are a 4/10 pressure headache that starts across the forehead and spreads to vertex.  Associated with nausea and photophobia but no vomiting, phonophobia or visual disturbance.  Typically would occur at night when she goes to bed and would have resolved when she wakes up in the morning.  Usually occurs once a week.  May treat with ibuprofen.  Around 2017, headaches became worse, more severe and pounding.  This went on for about a year until she saw ENT was found to have a fungal sinus infection.  Began experiencing sinus pressure and nasal congestion in January 2024.  Treated with ormnicef and nasal sprays.  Symptoms subsided.  She then developed one of her habitual headache on 04/17/2022 which was persistent and lasted a week.   MRI of brain with and without contrast on 05/02/2022 personally reviewed was unremarkable.  Afterwards, they resorted to occurring once a week again.    She started taking naproxen twice daily 3 months ago to treat her arthritic knee pain.  She has not had any headaches since then.   or weakness.  Past NSAIDS/analgesics:  none Past abortive triptans:  none Past abortive ergotamine:  none Past muscle relaxants:  none Past anti-emetic:  none Past antihypertensive medications:  none Past antidepressant medications:  none Past anticonvulsant medications:  gabapentin Past anti-CGRP:  none Past vitamins/Herbal/Supplements:  magnesium oxide Past antihistamines/decongestants:  none Other past therapies:  none  Current NSAIDS/analgesics:  ibuprofen, acetaminophen, naproxen 500mg  Current triptans:  none Current ergotamine:  none Current anti-emetic:  none Current muscle relaxants:  Flexeril Current Antihypertensive medications:  lisinopril  Current Antidepressant medications:  Lexapro 20mg  daily Current Anticonvulsant medications:  none Current anti-CGRP:  none Current Vitamins/Herbal/Supplements:  melatonin 10mg  at bedtime, Integra Current Antihistamines/Decongestants:  Zyrtec, Flonase Other therapy:  none   Caffeine:  2 cups coffee every morning.  Sometimes tea or diet cola Diet:  Water and juice.  Does not usually skip meals.  Light lunch. Exercise:  swims 30 minutes a day 5 days a week Depression:  no; Anxiety:  stable Other pain:  knee pain Sleep hygiene:  sometimes difficulty falling asleep. May fall asleep after midnight but will sleep until 8 AM.  Uses melatonin if needed Family history of headache:  mother, maternal grandfather      PAST MEDICAL HISTORY: Past Medical History:  Diagnosis Date   Anemia  Noted 11/2011   Anxiety    Chest pain    Admitted 11/2011: cath with mild nonobstructive coronary plaque, normal EF, negative cardiac enzymes and negative d-dimer.    Diabetes mellitus    Fatty liver disease, nonalcoholic 2014   GERD (gastroesophageal reflux disease)    History of COVID-19 06/17/2019   Hypercholesterolemia    Hypertension    Reflux    TMJ dysfunction     PAST SURGICAL HISTORY: Past Surgical History:  Procedure Laterality Date   ABDOMINAL HYSTERECTOMY  2009   total   BILATERAL SALPINGOOPHORECTOMY  2009   benign ovarian tumor   HERNIA REPAIR     umbilical   IMAGE GUIDED SINUS SURGERY N/A 10/22/2015   Procedure: IMAGE GUIDED SINUS SURGERY;  Surgeon: Bud Face, MD;  Location: [facility] ORS;  Service: ENT;  Laterality: N/A;   KNEE SURGERY Bilateral    LEFT HEART CATHETERIZATION WITH CORONARY ANGIOGRAM N/A 12/09/2011   Procedure: LEFT HEART CATHETERIZATION WITH CORONARY ANGIOGRAM;  Surgeon: Rollene Rotunda, MD;  Location: [facility] CATH LAB;  Service: Cardiovascular;  Laterality: N/A;   SPHENOIDECTOMY Left 10/22/2015   Procedure: SPHENOIDECTOMY;  Surgeon: Bud Face, MD;  Location: [facility] ORS;  Service: ENT;  Laterality: Left;   SUBMANDIBULAR MASS EXCISION  1996   [facility]     MEDICATIONS: Current Outpatient Medications on File Prior to Visit  Medication Sig Dispense Refill   acetaminophen (TYLENOL) 500 MG tablet Take 1,000 mg by mouth every 6 (six) hours as needed for mild pain.     acyclovir ointment (ZOVIRAX) 5 % Apply 1 Application topically every 6 (six) hours. 15 g 0   albuterol (VENTOLIN HFA) 108 (90 Base) MCG/ACT inhaler Inhale 2 puffs into the lungs every 6 (six) hours as needed for wheezing or shortness of breath. 8 g 6   amoxicillin-clavulanate (AUGMENTIN) 875-125 MG tablet Take 1 tablet by mouth 2 (two) times daily. 14 tablet 0   Blood Glucose Monitoring Suppl (ONE TOUCH ULTRA 2) w/Device KIT U D TO CHECK BLOOD SUGARS  0   cetirizine (ZYRTEC) 10 MG tablet Take 10 mg by mouth daily.     clotrimazole (MYCELEX) 10 MG troche Take 1 tablet (10 mg total) by mouth 3 (three) times daily. 21 Troche 0   cyclobenzaprine (FLEXERIL) 10 MG  tablet 1/2 tablet q hs prn (do not take without wearing cpap) 20 tablet 0   dapagliflozin propanediol (FARXIGA) 10 MG TABS tablet TAKE 1 TABLET(10 MG) BY MOUTH EVERY MORNING 90 tablet 1   Dulaglutide (TRULICITY) 3 MG/0.5ML SOPN Inject 3 mg into the skin every 7 (seven) days as directed. 6 mL 1   escitalopram (LEXAPRO) 20 MG tablet TAKE 1 TABLET(20 MG) BY MOUTH DAILY 90 tablet 1   esomeprazole (NEXIUM) 20 MG capsule Take 20 mg by mouth daily at 12 noon.     famotidine (PEPCID) 40 MG tablet Take 40 mg by mouth 2 (two) times daily.     Fe Fum-FePoly-Vit C-Vit B3 (INTEGRA) 62.5-62.5-40-3 MG CAPS Take 1 capsule by mouth 2 (two) times daily. 180 capsule 1   fluconazole (DIFLUCAN) 150 MG tablet Take one tablet by mouth for a total of one dose. If symptoms persist may repeat in 3 days. 2 tablet 0   fluticasone (FLONASE) 50 MCG/ACT nasal spray Place 2 sprays into both nostrils daily. 16 g 5   gabapentin (NEURONTIN) 100 MG capsule Take 100 mg by mouth daily.     glucose blood (ONETOUCH ULTRA) test strip USE AS DIRECTED TO CHECK BLOOD  SUGAR TWICE DAILY 100 strip 2   levocetirizine (XYZAL) 5 MG tablet SMARTSIG:1 Tablet(s) By Mouth Every Evening     lisinopril (ZESTRIL) 20 MG tablet TAKE 1 TABLET(20 MG) BY MOUTH DAILY 90 tablet 1   LORazepam (ATIVAN) 0.5 MG tablet Take 1/2 tablet x 1 prior to procedure. May repeat 1/2 tablet x 1 if needed. 2 tablet 0   Magnesium Oxide 400 MG CAPS Take one tablet q day 30 capsule 3   Melatonin 10 MG TABS Take 10 mg by mouth at bedtime as needed (sleep).     meloxicam (MOBIC) 15 MG tablet Take 15 mg by mouth daily.  3   metFORMIN (GLUCOPHAGE-XR) 500 MG 24 hr tablet Take two tablets bid 120 tablet 5   mometasone (ELOCON) 0.1 % cream Apply topically 2 (two) times daily.     montelukast (SINGULAIR) 10 MG tablet Take 1 tablet (10 mg total) by mouth at bedtime. 30 tablet 4   naproxen (NAPROSYN) 500 MG tablet Take 500 mg by mouth 2 (two) times daily.     nystatin cream (MYCOSTATIN)  Apply 1 application topically 2 (two) times daily. 30 g 0   Probiotic Product (PHILLIPS COLON HEALTH PO) Take 1 tablet by mouth daily as needed (colon health).      rosuvastatin (CRESTOR) 20 MG tablet TAKE 1 TABLET(20 MG) BY MOUTH DAILY 90 tablet 1   valACYclovir (VALTREX) 1000 MG tablet Take 1 tablet (1,000 mg total) by mouth 2 (two) times daily. 21 tablet 0   No current facility-administered medications on file prior to visit.    ALLERGIES: Allergies  Allergen Reactions   Sulfa Antibiotics Other (See Comments)    Topical--red itchy; oral--makes mouth raw Bleeding sores in mouth. Raw mouth and sores on gums     FAMILY HISTORY: Family History  Problem Relation Age of Onset   Heart disease Father        Father had CABG in his 67s   Colon polyps Father    Lung cancer Mother 19   Breast cancer Paternal Grandmother 37   Arthritis/Rheumatoid Paternal Grandfather    Breast cancer Paternal Aunt 39    Objective:  Blood pressure 103/61, pulse 85, height 5\' 3"  (1.6 m), weight 181 lb (82.1 kg), SpO2 96%. General: No acute distress.  Patient appears well-groomed.   Head:  Normocephalic/atraumatic  No TMJ tenderness Eyes:  fundi examined but not visualized Neck: supple, no paraspinal tenderness, full range of motion Back: No paraspinal tenderness Heart: regular rate and rhythm Neurological Exam: Mental status: alert and oriented to person, place, and time, speech fluent and not dysarthric, language intact. Cranial nerves: CN I: not tested CN II: pupils equal, round and reactive to light, visual fields intact CN III, IV, VI:  full range of motion, no nystagmus, no ptosis CN V: facial sensation intact. CN VII: upper and lower face symmetric CN VIII: hearing intact CN IX, X: gag intact, uvula midline CN XI: sternocleidomastoid and trapezius muscles intact CN XII: tongue midline Bulk & Tone: normal, no fasciculations. Motor:  muscle strength 5/5 throughout Sensation:  Pinprick and  vibratory sensation intact. Deep Tendon Reflexes:  2+ throughout,  toes downgoing.   Finger to nose testing:  Without dysmetria.   Gait:  Normal station and stride.  Romberg negative.    Thank you for allowing me to take part in the care of this patient.  Shon Millet, DO  CC: Dale Dugger, MD

## 2023-01-03 NOTE — ED Triage Notes (Signed)
Patient to Urgent Care for 2nd dose of Rabavert in Rabies series. Initial vaccines administered at this location 10/19 following a feral cat bite to her right forearm.  Denies any other complaints.  Tolerated initial vaccines well.

## 2023-01-04 ENCOUNTER — Ambulatory Visit: Payer: BC Managed Care – PPO | Admitting: Neurology

## 2023-01-04 ENCOUNTER — Encounter: Payer: Self-pay | Admitting: Neurology

## 2023-01-04 VITALS — BP 103/61 | HR 85 | Ht 63.0 in | Wt 181.0 lb

## 2023-01-04 DIAGNOSIS — G43009 Migraine without aura, not intractable, without status migrainosus: Secondary | ICD-10-CM | POA: Diagnosis not present

## 2023-01-04 NOTE — Patient Instructions (Addendum)
  Limit use of pain relievers to no more than 2 days out of the week.  These medications include acetaminophen, NSAIDs (ibuprofen/Advil/Motrin, naproxen/Aleve, triptans (Imitrex/sumatriptan), Excedrin, and narcotics.  This will help reduce risk of rebound headaches. Be aware of common food triggers Routine exercise Stay adequately hydrated (aim for 64 oz water daily) Keep headache diary Maintain proper stress management Maintain proper sleep hygiene Do not skip meals Consider supplements:  magnesium citrate 400mg  daily, riboflavin 400mg  daily, coenzyme Q10 300mg  daily If headaches return, contact me and we can start a preventative and have you follow up .

## 2023-01-07 ENCOUNTER — Ambulatory Visit
Admission: EM | Admit: 2023-01-07 | Discharge: 2023-01-07 | Disposition: A | Discharge status: Home / Self Care | Payer: BC Managed Care – PPO | Attending: Emergency Medicine | Admitting: Emergency Medicine

## 2023-01-07 DIAGNOSIS — Z203 Contact with and (suspected) exposure to rabies: Secondary | ICD-10-CM

## 2023-01-07 MED ORDER — RABIES VACCINE, PCEC IM SUSR
1.0000 mL | Freq: Once | INTRAMUSCULAR | Status: AC
  Administered 2023-01-07: 1 mL via INTRAMUSCULAR

## 2023-01-07 NOTE — ED Triage Notes (Signed)
Pt is here for 3rd dose of Rabavert in rabies series. First dose was gave at [facility] on 10/19 for cat bite on her right forearm. Pt states no reactions from prior vaccinees and no questions or concerns today.  Gave injection in right arm.

## 2023-01-14 ENCOUNTER — Ambulatory Visit
Admission: EM | Admit: 2023-01-14 | Discharge: 2023-01-14 | Disposition: A | Discharge status: Home / Self Care | Payer: BC Managed Care – PPO | Attending: Emergency Medicine | Admitting: Emergency Medicine

## 2023-01-14 ENCOUNTER — Other Ambulatory Visit (HOSPITAL_COMMUNITY): Payer: Self-pay

## 2023-01-14 DIAGNOSIS — Z203 Contact with and (suspected) exposure to rabies: Secondary | ICD-10-CM | POA: Diagnosis not present

## 2023-01-14 DIAGNOSIS — Z23 Encounter for immunization: Secondary | ICD-10-CM | POA: Diagnosis not present

## 2023-01-14 MED ORDER — RABIES VACCINE, PCEC IM SUSR
1.0000 mL | Freq: Once | INTRAMUSCULAR | Status: AC
  Administered 2023-01-14: 1 mL via INTRAMUSCULAR

## 2023-01-14 NOTE — ED Triage Notes (Signed)
Patient here for final Rabavert vaccine in rabies series.   Tolerated other vaccines well. Denies any other complaints.

## 2023-01-21 ENCOUNTER — Other Ambulatory Visit (HOSPITAL_COMMUNITY): Payer: Self-pay

## 2023-02-02 ENCOUNTER — Other Ambulatory Visit: Payer: Self-pay | Admitting: Internal Medicine

## 2023-02-14 ENCOUNTER — Other Ambulatory Visit: Payer: Self-pay

## 2023-02-15 ENCOUNTER — Other Ambulatory Visit (HOSPITAL_COMMUNITY): Payer: Self-pay

## 2023-03-01 ENCOUNTER — Encounter: Payer: Self-pay | Admitting: Internal Medicine

## 2023-03-01 DIAGNOSIS — K219 Gastro-esophageal reflux disease without esophagitis: Secondary | ICD-10-CM

## 2023-03-01 DIAGNOSIS — K760 Fatty (change of) liver, not elsewhere classified: Secondary | ICD-10-CM

## 2023-03-05 ENCOUNTER — Other Ambulatory Visit: Payer: Self-pay | Admitting: Internal Medicine

## 2023-03-11 ENCOUNTER — Other Ambulatory Visit: Payer: Self-pay | Admitting: Internal Medicine

## 2023-03-16 ENCOUNTER — Other Ambulatory Visit (HOSPITAL_COMMUNITY): Payer: Self-pay

## 2023-04-04 ENCOUNTER — Encounter: Payer: Self-pay | Admitting: Internal Medicine

## 2023-04-11 ENCOUNTER — Other Ambulatory Visit: Payer: Self-pay | Admitting: Gastroenterology

## 2023-04-11 DIAGNOSIS — K760 Fatty (change of) liver, not elsewhere classified: Secondary | ICD-10-CM

## 2023-04-12 ENCOUNTER — Other Ambulatory Visit (HOSPITAL_COMMUNITY): Payer: Self-pay

## 2023-04-19 ENCOUNTER — Telehealth: Payer: Self-pay | Admitting: Internal Medicine

## 2023-04-19 DIAGNOSIS — E7800 Pure hypercholesterolemia, unspecified: Secondary | ICD-10-CM

## 2023-04-19 DIAGNOSIS — I10 Essential (primary) hypertension: Secondary | ICD-10-CM

## 2023-04-19 DIAGNOSIS — E1159 Type 2 diabetes mellitus with other circulatory complications: Secondary | ICD-10-CM

## 2023-04-19 NOTE — Telephone Encounter (Signed)
 Patient need lab orders.

## 2023-04-20 NOTE — Telephone Encounter (Signed)
 Labs ordered.

## 2023-04-20 NOTE — Addendum Note (Signed)
 Addended by: Victorino Grates D on: 04/20/2023 03:15 PM   Modules accepted: Orders

## 2023-04-23 ENCOUNTER — Encounter: Payer: Self-pay | Admitting: Neurology

## 2023-04-24 MED ORDER — PREDNISONE 10 MG (21) PO TBPK: ORAL_TABLET | ORAL | 0 refills | Status: DC

## 2023-04-25 ENCOUNTER — Ambulatory Visit
Admission: RE | Admit: 2023-04-25 | Discharge: 2023-04-25 | Disposition: A | Discharge status: Home / Self Care | Payer: 59 | Source: Ambulatory Visit | Attending: Gastroenterology | Admitting: Gastroenterology

## 2023-04-25 DIAGNOSIS — K760 Fatty (change of) liver, not elsewhere classified: Secondary | ICD-10-CM

## 2023-04-26 ENCOUNTER — Other Ambulatory Visit: Payer: 59

## 2023-04-26 DIAGNOSIS — E7800 Pure hypercholesterolemia, unspecified: Secondary | ICD-10-CM

## 2023-04-26 DIAGNOSIS — I10 Essential (primary) hypertension: Secondary | ICD-10-CM | POA: Diagnosis not present

## 2023-04-26 DIAGNOSIS — E1159 Type 2 diabetes mellitus with other circulatory complications: Secondary | ICD-10-CM

## 2023-04-26 LAB — LIPID PANEL
Cholesterol: 175 mg/dL (ref 0–200)
HDL: 74.9 mg/dL (ref >39.00)
LDL Cholesterol: 82 mg/dL (ref 0–99)
NonHDL: 100.37
Total CHOL/HDL Ratio: 2
Triglycerides: 93 mg/dL (ref 0.0–149.0)
VLDL: 18.6 mg/dL (ref 0.0–40.0)

## 2023-04-26 LAB — BASIC METABOLIC PANEL
BUN: 16 mg/dL (ref 6–23)
CO2: 29 meq/L (ref 19–32)
Calcium: 9.7 mg/dL (ref 8.4–10.5)
Chloride: 99 meq/L (ref 96–112)
Creatinine, Ser: 0.55 mg/dL (ref 0.40–1.20)
GFR: 97.05 mL/min (ref >60.00)
Glucose, Bld: 150 mg/dL — ABNORMAL HIGH (ref 70–99)
Potassium: 4.2 meq/L (ref 3.5–5.1)
Sodium: 138 meq/L (ref 135–145)

## 2023-04-26 LAB — HEPATIC FUNCTION PANEL
ALT: 12 U/L (ref 0–35)
AST: 13 U/L (ref 0–37)
Albumin: 4.5 g/dL (ref 3.5–5.2)
Alkaline Phosphatase: 52 U/L (ref 39–117)
Bilirubin, Direct: 0.1 mg/dL (ref 0.0–0.3)
Total Bilirubin: 0.3 mg/dL (ref 0.2–1.2)
Total Protein: 7.4 g/dL (ref 6.0–8.3)

## 2023-04-26 LAB — HEMOGLOBIN A1C: Hgb A1c MFr Bld: 6.3 % (ref 4.6–6.5)

## 2023-04-30 ENCOUNTER — Other Ambulatory Visit: Payer: Self-pay | Admitting: Internal Medicine

## 2023-05-02 ENCOUNTER — Ambulatory Visit: Payer: 59 | Admitting: Internal Medicine

## 2023-05-02 ENCOUNTER — Other Ambulatory Visit (HOSPITAL_COMMUNITY): Payer: Self-pay

## 2023-05-02 ENCOUNTER — Other Ambulatory Visit: Payer: Self-pay

## 2023-05-02 VITALS — BP 128/76 | HR 76 | Temp 98.3°F | Resp 16 | Ht 63.0 in | Wt 172.2 lb

## 2023-05-02 DIAGNOSIS — Z7985 Long-term (current) use of injectable non-insulin antidiabetic drugs: Secondary | ICD-10-CM

## 2023-05-02 DIAGNOSIS — E7800 Pure hypercholesterolemia, unspecified: Secondary | ICD-10-CM | POA: Diagnosis not present

## 2023-05-02 DIAGNOSIS — I10 Essential (primary) hypertension: Secondary | ICD-10-CM

## 2023-05-02 DIAGNOSIS — E1159 Type 2 diabetes mellitus with other circulatory complications: Secondary | ICD-10-CM | POA: Diagnosis not present

## 2023-05-02 DIAGNOSIS — D649 Anemia, unspecified: Secondary | ICD-10-CM

## 2023-05-02 DIAGNOSIS — K219 Gastro-esophageal reflux disease without esophagitis: Secondary | ICD-10-CM

## 2023-05-02 DIAGNOSIS — I25118 Atherosclerotic heart disease of native coronary artery with other forms of angina pectoris: Secondary | ICD-10-CM

## 2023-05-02 MED ORDER — TRULICITY 3 MG/0.5ML ~~LOC~~ SOAJ
3.0000 mg | SUBCUTANEOUS | 1 refills | Status: DC
  Filled 2023-05-02: qty 2, 28d supply, fill #0
  Filled 2023-05-08: qty 6, 84d supply, fill #0
  Filled 2023-07-31: qty 6, 84d supply, fill #1

## 2023-05-02 NOTE — Progress Notes (Signed)
 Subjective:    Patient ID: Evelyn James, female    DOB: 09-Feb-1961, 63 y.o.   MRN: [id number]  Patient here for  Chief Complaint  Patient presents with   Medical Management of Chronic Issues    HPI Here for a scheduled follow up. Follow up with GI regarding GERD 04/05/23 - continues nexium 20mg  q day. Controlling upper symptoms. Recommended f/u colonoscopy. Scheduled for May. Limiting antiinflammatory intake. Has seen neurology (Dr Everlena Cooper) - f/u headache. Recently reported headache - persistent. Dr Everlena Cooper prescribed prednisone taper. Headache is better today. When first wakes up - notices more headache. Overall improved. Discussed taking MR. Blood pressure doing well. Breathing overall stable.    Past Medical History:  Diagnosis Date   Anemia    Noted 11/2011   Anxiety    Chest pain    Admitted 11/2011: cath with mild nonobstructive coronary plaque, normal EF, negative cardiac enzymes and negative d-dimer.   Diabetes mellitus    Fatty liver disease, nonalcoholic 2014   GERD (gastroesophageal reflux disease)    History of COVID-19 06/17/2019   Hypercholesterolemia    Hypertension    Reflux    TMJ dysfunction    Past Surgical History:  Procedure Laterality Date   ABDOMINAL HYSTERECTOMY  2009   total   BILATERAL SALPINGOOPHORECTOMY  2009   benign ovarian tumor   HERNIA REPAIR     umbilical   IMAGE GUIDED SINUS SURGERY N/A 10/22/2015   Procedure: IMAGE GUIDED SINUS SURGERY;  Surgeon: Bud Face, MD;  Location: [facility] ORS;  Service: ENT;  Laterality: N/A;   KNEE SURGERY Bilateral    LEFT HEART CATHETERIZATION WITH CORONARY ANGIOGRAM N/A 12/09/2011   Procedure: LEFT HEART CATHETERIZATION WITH CORONARY ANGIOGRAM;  Surgeon: Rollene Rotunda, MD;  Location: [facility] CATH LAB;  Service: Cardiovascular;  Laterality: N/A;   SPHENOIDECTOMY Left 10/22/2015   Procedure: SPHENOIDECTOMY;  Surgeon: Bud Face, MD;  Location: [facility] ORS;  Service: ENT;  Laterality: Left;   SUBMANDIBULAR MASS  EXCISION  1996   [facility]    Family History  Problem Relation Age of Onset   Lung cancer Mother 82   Parkinsonism Father    Neuropathy Father    Dementia Father    Heart disease Father        Father had CABG in his 3s   Colon polyps Father    Breast cancer Paternal Aunt 31   Breast cancer Paternal Grandmother 63   Arthritis/Rheumatoid Paternal Grandfather    Social History   Socioeconomic History   Marital status: Married    Spouse name: Not on file   Number of children: 2   Years of education: Not on file   Highest education level: 12th grade  Occupational History   Not on file  Tobacco Use   Smoking status: Never   Smokeless tobacco: Never  Vaping Use   Vaping status: Never Used  Substance and Sexual Activity   Alcohol use: No    Alcohol/week: 0.0 standard drinks of alcohol   Drug use: No   Sexual activity: Not on file  Other Topics Concern   Not on file  Social History Narrative   Not on file   Social Drivers of Health   Financial Resource Strain: Low Risk  (12/28/2022)   Overall Financial Resource Strain (CARDIA)    Difficulty of Paying Living Expenses: Not hard at all  Food Insecurity: No Food Insecurity (12/28/2022)   Hunger Vital Sign    Worried About Programme researcher, broadcasting/film/video in  the Last Year: Never true    Ran Out of Food in the Last Year: Never true  Transportation Needs: No Transportation Needs (12/28/2022)   PRAPARE - Administrator, Civil Service (Medical): No    Lack of Transportation (Non-Medical): No  Physical Activity: Sufficiently Active (12/28/2022)   Exercise Vital Sign    Days of Exercise per Week: 5 days    Minutes of Exercise per Session: 30 min  Stress: No Stress Concern Present (12/28/2022)   [employer] of Occupational Health - Occupational Stress Questionnaire    Feeling of Stress : Not at all  Social Connections: Socially Integrated (12/28/2022)   Social Connection and Isolation Panel [NHANES]    Frequency of  Communication with Friends and Family: Twice a week    Frequency of Social Gatherings with Friends and Family: More than three times a week    Attends Religious Services: More than 4 times per year    Active Member of [employer] or Organizations: Yes    Attends Engineer, structural: More than 4 times per year    Marital Status: Married     Review of Systems  Constitutional:  Negative for appetite change and unexpected weight change.  HENT:  Negative for congestion and sinus pressure.   Respiratory:  Negative for cough, chest tightness and shortness of breath.   Cardiovascular:  Negative for chest pain and palpitations.  Gastrointestinal:  Negative for abdominal pain, diarrhea, nausea and vomiting.  Genitourinary:  Negative for difficulty urinating and dysuria.  Musculoskeletal:  Negative for joint swelling and myalgias.  Skin:  Negative for color change and rash.  Neurological:  Positive for headaches. Negative for dizziness.  Psychiatric/Behavioral:  Negative for agitation and dysphoric mood.        Objective:     BP 128/76   Pulse 76   Temp 98.3 F (36.8 C)   Resp 16   Ht 5\' 3"  (1.6 m)   Wt 172 lb 3.2 oz (78.1 kg)   SpO2 98%   BMI 30.50 kg/m  Wt Readings from Last 3 Encounters:  05/02/23 172 lb 3.2 oz (78.1 kg)  01/04/23 181 lb (82.1 kg)  12/31/22 180 lb 3.2 oz (81.7 kg)    Physical Exam Vitals reviewed.  Constitutional:      General: She is not in acute distress.    Appearance: Normal appearance.  HENT:     Head: Normocephalic and atraumatic.     Right Ear: External ear normal.     Left Ear: External ear normal.  Eyes:     General: No scleral icterus.       Right eye: No discharge.        Left eye: No discharge.     Conjunctiva/sclera: Conjunctivae normal.  Neck:     Thyroid: No thyromegaly.  Cardiovascular:     Rate and Rhythm: Normal rate and regular rhythm.  Pulmonary:     Effort: No respiratory distress.     Breath sounds: Normal breath sounds.  No wheezing.  Abdominal:     General: Bowel sounds are normal.     Palpations: Abdomen is soft.     Tenderness: There is no abdominal tenderness.  Musculoskeletal:        General: No swelling or tenderness.     Cervical back: Neck supple. No tenderness.  Lymphadenopathy:     Cervical: No cervical adenopathy.  Skin:    Findings: No erythema or rash.  Neurological:     Mental Status:  She is alert.  Psychiatric:        Mood and Affect: Mood normal.        Behavior: Behavior normal.         Outpatient Encounter Medications as of 05/02/2023  Medication Sig   estradiol (ESTRACE) 0.1 MG/GM vaginal cream Place 1 Applicatorful vaginally 2 (two) times a week.   acetaminophen (TYLENOL) 500 MG tablet Take 1,000 mg by mouth every 6 (six) hours as needed for mild pain.   acyclovir ointment (ZOVIRAX) 5 % Apply 1 Application topically every 6 (six) hours.   Blood Glucose Monitoring Suppl (ONE TOUCH ULTRA 2) w/Device KIT U D TO CHECK BLOOD SUGARS   cetirizine (ZYRTEC) 10 MG tablet Take 10 mg by mouth daily.   cyclobenzaprine (FLEXERIL) 10 MG tablet 1/2 tablet q hs prn (do not take without wearing cpap)   Dulaglutide (TRULICITY) 3 MG/0.5ML SOAJ Inject 3 mg into the skin every 7 (seven) days as directed.   escitalopram (LEXAPRO) 20 MG tablet TAKE 1 TABLET(20 MG) BY MOUTH DAILY   esomeprazole (NEXIUM) 20 MG capsule Take 20 mg by mouth daily at 12 noon.   famotidine (PEPCID) 40 MG tablet Take 40 mg by mouth 2 (two) times daily.   FARXIGA 10 MG TABS tablet TAKE 1 TABLET(10 MG) BY MOUTH EVERY MORNING   Fe Fum-FePoly-Vit C-Vit B3 (INTEGRA) 62.5-62.5-40-3 MG CAPS Take 1 capsule by mouth 2 (two) times daily.   fluticasone (FLONASE) 50 MCG/ACT nasal spray Place 2 sprays into both nostrils daily.   gabapentin (NEURONTIN) 100 MG capsule Take 300 mg by mouth at bedtime.   glucose blood (ONETOUCH ULTRA) test strip USE TO CHECK BLOOD SUGAR TWICE DAILY AS DIRECTED   levocetirizine (XYZAL) 5 MG tablet  SMARTSIG:1 Tablet(s) By Mouth Every Evening   lisinopril (ZESTRIL) 20 MG tablet TAKE 1 TABLET(20 MG) BY MOUTH DAILY   Melatonin 10 MG TABS Take 10 mg by mouth at bedtime as needed (sleep).   metFORMIN (GLUCOPHAGE-XR) 500 MG 24 hr tablet TAKE 2 TABLETS BY MOUTH TWICE DAILY   mometasone (ELOCON) 0.1 % cream Apply topically 2 (two) times daily.   montelukast (SINGULAIR) 10 MG tablet Take 1 tablet (10 mg total) by mouth at bedtime.   naproxen (NAPROSYN) 500 MG tablet Take 500 mg by mouth 2 (two) times daily.   nystatin cream (MYCOSTATIN) Apply 1 application topically 2 (two) times daily.   Probiotic Product (PHILLIPS COLON HEALTH PO) Take 1 tablet by mouth daily as needed (colon health).    rosuvastatin (CRESTOR) 20 MG tablet TAKE 1 TABLET(20 MG) BY MOUTH DAILY   valACYclovir (VALTREX) 1000 MG tablet Take 1 tablet (1,000 mg total) by mouth 2 (two) times daily.   [DISCONTINUED] amoxicillin-clavulanate (AUGMENTIN) 875-125 MG tablet Take 1 tablet by mouth 2 (two) times daily.   [DISCONTINUED] clotrimazole (MYCELEX) 10 MG troche Take 1 tablet (10 mg total) by mouth 3 (three) times daily.   [DISCONTINUED] Dulaglutide (TRULICITY) 3 MG/0.5ML SOAJ Inject 3 mg into the skin every 7 (seven) days as directed.   [DISCONTINUED] fluconazole (DIFLUCAN) 150 MG tablet Take one tablet by mouth for a total of one dose. If symptoms persist may repeat in 3 days.   [DISCONTINUED] LORazepam (ATIVAN) 0.5 MG tablet Take 1/2 tablet x 1 prior to procedure. May repeat 1/2 tablet x 1 if needed.   [DISCONTINUED] Magnesium Oxide 400 MG CAPS Take one tablet q day (Patient not taking: Reported on 01/04/2023)   [DISCONTINUED] predniSONE (STERAPRED UNI-PAK 21 TAB) 10 MG (21)  TBPK tablet take 60mg  day 1, then 50mg  day 2, then 40mg  day 3, then 30mg  day 4, then 20mg  day 5, then 10mg  day 6, then STOP   No facility-administered encounter medications on file as of 05/02/2023.     Lab Results  Component Value Date   WBC 10.0 12/07/2022    HGB 12.5 12/07/2022   HCT 39.4 12/07/2022   PLT 237.0 12/07/2022   GLUCOSE 150 (H) 04/26/2023   CHOL 175 04/26/2023   TRIG 93.0 04/26/2023   HDL 74.90 04/26/2023   LDLDIRECT 187.0 03/26/2021   LDLCALC 82 04/26/2023   ALT 12 04/26/2023   AST 13 04/26/2023   NA 138 04/26/2023   K 4.2 04/26/2023   CL 99 04/26/2023   CREATININE 0.55 04/26/2023   BUN 16 04/26/2023   CO2 29 04/26/2023   TSH 1.89 12/07/2022   HGBA1C 6.3 04/26/2023   MICROALBUR 3.2 (H) 12/07/2022    US Abdomen Limited RUQ (LIVER/GB) Result Date: 04/25/2023 : PROCEDURE: ULTRASOUND ABDOMEN LIMITED HISTORY: Patient is a 63 y/o  F with fatty liver. COMPARISON: U/S abdomen 04/08/2021, CT AP 04/10/2018. TECHNIQUE: Two-dimensional grayscale and color Doppler ultrasound of the limited abdomen was performed. FINDINGS: The liver demonstrates an increased echotexture without intrahepatic biliary dilatation. No masses are visualized. There is normal hepatopetal flow visualized within the main portal vein. The gallbladder demonstrates normal anechoic echotexture without pericholecystic fluid or wall thickening. There are no gallstones. The common bile duct measures 0.2 cm. Negative sonographic Murphy's sign. IMPRESSION: 1. Increased hepatic echotexture, most commonly seen with steatosis. Correlation with LFT's is recommended. Thank you for allowing [place] to assist in the care of this patient. Electronically Signed   By: Lestine Box M.D.   On: 04/25/2023 16:17       Assessment & Plan:  Anemia, unspecified type Assessment & Plan: Follow up with GI regarding GERD 04/05/23 - continues nexium 20mg  q day. Controlling upper symptoms. Recommended f/u colonoscopy. Scheduled for May. Taking iron.   Orders: -     IBC + Ferritin; Future -     CBC with Differential/Platelet; Future  Hypercholesterolemia Assessment & Plan: Continue crestor. Low cholesterol diet and exercise. Follow lipid panel and liver function tests.   Lab Results  Component  Value Date   CHOL 175 04/26/2023   HDL 74.90 04/26/2023   LDLCALC 82 04/26/2023   LDLDIRECT 187.0 03/26/2021   TRIG 93.0 04/26/2023   CHOLHDL 2 04/26/2023     Orders: -     Lipid panel; Future -     Hepatic function panel; Future  Primary hypertension Assessment & Plan: Continue lisinopril. Blood pressure 118/76. No changes. Follow pressures.  Follow metabolic panel.   Orders: -     Basic metabolic panel; Future  Type 2 diabetes mellitus with other circulatory complication, without long-term current use of insulin (HCC) Assessment & Plan: Continue trulicity, farxiga and metformin. Low carb diet and exercise. Follow met b and A1c.   Lab Results  Component Value Date   HGBA1C 6.3 04/26/2023     Orders: -     Hemoglobin A1c; Future -     Trulicity; Inject 3 mg into the skin every 7 (seven) days as directed.  Dispense: 6 mL; Refill: 1  Gastroesophageal reflux disease, unspecified whether esophagitis present Assessment & Plan: Continue nexium. No upper symptoms reported.    Coronary artery disease involving native coronary artery of native heart with other form of angina pectoris [facility]) Assessment & Plan: Continue risk factor modification. Continue  crestor.       Dale Alston, MD

## 2023-05-05 NOTE — Progress Notes (Unsigned)
 NEUROLOGY FOLLOW UP OFFICE NOTE  Evelyn James [id number]  Assessment/Plan:   Migraine without aura, with status migrainosus, intractable. Interestingly, recurred in February, just like last year.  Often at night.  However, symptoms not consistent with cluster headache.   As it remains to be seen if her current dull headache will be chronic or if she will soon have recurrence of more severe migraines, would defer starting a preventative today.  If she still has a mild headache through the weekend, she will contact me next Monday and I will start topiramate 25mg  at bedtime.  If the headache subsides, then I would monitor and if she should have another attack soon, would start a preventative.  If she has another attack a year from now, ideally I would treat with another prednisone taper (pending close monitoring of glucose and approval from her PCP) Limit use of pain relievers to no more than 2 days out of week to prevent risk of rebound or medication-overuse headache. Keep headache diary Follow up 6 months.   Total time spent in chart and face to face with patient:  26 minutes.    Subjective:  Evelyn James is a 63 year old right-handed female with HTN, DM 2, non-alcoholic fatty liver disease, asthma, hypercholesterolemia, anxiety, TMJ dysfunction and reflux who follows up for migraines.  UPDATE: She started taking naproxen twice daily over the summer to treat her arthritic knee pain.  She has not had any headaches since then.  She then developed status migrainosus on 1/31.  Prescribed a prednisone taper on 2/10 (and advised to monitor glucose closely).  Severe headaches ended on Friday.  Today she has a mild headache, described as pressure in the temples.  She is treating headaches with a mask over her forehead that applies heat and air pressure to massage her head.  She takes Tylenol in morning and night and will take an Excedrin Migraine only if needed.  Stabbing on eyebrow Or side  of head Temples Mask over forehead with heat, air pressure to massage.  Tylenol morning and night and may take Excedrin Migraine if needed .  Takes naproxen for knees  Day 3 without headache today mild.   No autonomic symptoms   Current NSAIDS/analgesics:  acetaminophen, Excedrin (rarely), naproxen 500mg  (knee pain) Current triptans:  none Current ergotamine:  none Current anti-emetic:  none Current muscle relaxants:  Flexeril Current Antihypertensive medications:  lisinopril  Current Antidepressant medications:  Lexapro 20mg  daily Current Anticonvulsant medications:  none Current anti-CGRP:  none Current Vitamins/Herbal/Supplements:  melatonin 10mg  at bedtime, Integra Current Antihistamines/Decongestants:  Zyrtec, Flonase Other therapy:  none   Caffeine:  2 cups coffee every morning.  Sometimes tea or diet cola Diet:  Water and juice.  Does not usually skip meals.  Light lunch. Exercise:  swims 30 minutes a day 5 days a week Depression:  no; Anxiety:  stable Other pain:  knee pain Sleep hygiene:  sometimes difficulty falling asleep. May fall asleep after midnight but will sleep until 8 AM.  Uses melatonin if needed  HISTORY: She has had history of intermittent headaches for many years.  They are a 4/10 pressure headache that starts across the forehead and spreads to vertex.  Associated with nausea and photophobia but no vomiting, phonophobia or visual disturbance.  Typically would occur at night when she goes to bed and would have resolved when she wakes up in the morning.  Usually occurs once a week.  May treat with ibuprofen.  She  also has headaches described as stabbing over either eyebrow or throbbing on the side of her head.  No associated autonomic symptoms.    Around 2017, headaches became worse, more severe and pounding.  This went on for about a year until she saw ENT was found to have a fungal sinus infection.  Began experiencing sinus pressure and nasal congestion in  January 2024.  Treated with ormnicef and nasal sprays.  Symptoms subsided.  She then developed one of her habitual headache on 04/17/2022 which was persistent and lasted a week.  MRI of brain with and without contrast on 05/02/2022 personally reviewed was unremarkable.  Afterwards, they resorted to occurring once a week again.    Past NSAIDS/analgesics:  none Past abortive triptans:  none Past abortive ergotamine:  none Past muscle relaxants:  none Past anti-emetic:  none Past antihypertensive medications:  none Past antidepressant medications:  none Past anticonvulsant medications:  none Past anti-CGRP:  none Past vitamins/Herbal/Supplements:  magnesium oxide Past antihistamines/decongestants:  none Other past therapies:  prednisone taper    Family history of headache:  mother, maternal grandfather  PAST MEDICAL HISTORY: Past Medical History:  Diagnosis Date   Anemia    Noted 11/2011   Anxiety    Chest pain    Admitted 11/2011: cath with mild nonobstructive coronary plaque, normal EF, negative cardiac enzymes and negative d-dimer.   Diabetes mellitus    Fatty liver disease, nonalcoholic 2014   GERD (gastroesophageal reflux disease)    History of COVID-19 06/17/2019   Hypercholesterolemia    Hypertension    Reflux    TMJ dysfunction     MEDICATIONS: Current Outpatient Medications on File Prior to Visit  Medication Sig Dispense Refill   acetaminophen (TYLENOL) 500 MG tablet Take 1,000 mg by mouth every 6 (six) hours as needed for mild pain.     acyclovir ointment (ZOVIRAX) 5 % Apply 1 Application topically every 6 (six) hours. 15 g 0   Blood Glucose Monitoring Suppl (ONE TOUCH ULTRA 2) w/Device KIT U D TO CHECK BLOOD SUGARS  0   cetirizine (ZYRTEC) 10 MG tablet Take 10 mg by mouth daily.     clotrimazole (MYCELEX) 10 MG troche Take 1 tablet (10 mg total) by mouth 3 (three) times daily. 21 Troche 0   cyclobenzaprine (FLEXERIL) 10 MG tablet 1/2 tablet q hs prn (do not take without  wearing cpap) 20 tablet 0   Dulaglutide (TRULICITY) 3 MG/0.5ML SOAJ Inject 3 mg into the skin every 7 (seven) days as directed. 6 mL 1   escitalopram (LEXAPRO) 20 MG tablet TAKE 1 TABLET(20 MG) BY MOUTH DAILY 90 tablet 1   esomeprazole (NEXIUM) 20 MG capsule Take 20 mg by mouth daily at 12 noon.     estradiol (ESTRACE) 0.1 MG/GM vaginal cream Place 1 Applicatorful vaginally 2 (two) times a week.     famotidine (PEPCID) 40 MG tablet Take 40 mg by mouth 2 (two) times daily.     FARXIGA 10 MG TABS tablet TAKE 1 TABLET(10 MG) BY MOUTH EVERY MORNING 90 tablet 1   Fe Fum-FePoly-Vit C-Vit B3 (INTEGRA) 62.5-62.5-40-3 MG CAPS Take 1 capsule by mouth 2 (two) times daily. 180 capsule 1   fluticasone (FLONASE) 50 MCG/ACT nasal spray Place 2 sprays into both nostrils daily. 16 g 5   gabapentin (NEURONTIN) 100 MG capsule Take 300 mg by mouth at bedtime.     glucose blood (ONETOUCH ULTRA) test strip USE TO CHECK BLOOD SUGAR TWICE DAILY AS DIRECTED 100 strip  2   levocetirizine (XYZAL) 5 MG tablet SMARTSIG:1 Tablet(s) By Mouth Every Evening     lisinopril (ZESTRIL) 20 MG tablet TAKE 1 TABLET(20 MG) BY MOUTH DAILY 90 tablet 1   Magnesium Oxide 400 MG CAPS Take one tablet q day (Patient not taking: Reported on 01/04/2023) 30 capsule 3   Melatonin 10 MG TABS Take 10 mg by mouth at bedtime as needed (sleep).     metFORMIN (GLUCOPHAGE-XR) 500 MG 24 hr tablet TAKE 2 TABLETS BY MOUTH TWICE DAILY 120 tablet 5   mometasone (ELOCON) 0.1 % cream Apply topically 2 (two) times daily.     montelukast (SINGULAIR) 10 MG tablet Take 1 tablet (10 mg total) by mouth at bedtime. 30 tablet 4   naproxen (NAPROSYN) 500 MG tablet Take 500 mg by mouth 2 (two) times daily.     nystatin cream (MYCOSTATIN) Apply 1 application topically 2 (two) times daily. 30 g 0   Probiotic Product (PHILLIPS COLON HEALTH PO) Take 1 tablet by mouth daily as needed (colon health).      rosuvastatin (CRESTOR) 20 MG tablet TAKE 1 TABLET(20 MG) BY MOUTH DAILY  90 tablet 1   valACYclovir (VALTREX) 1000 MG tablet Take 1 tablet (1,000 mg total) by mouth 2 (two) times daily. 21 tablet 0   No current facility-administered medications on file prior to visit.    ALLERGIES: Allergies  Allergen Reactions   Sulfa Antibiotics Other (See Comments)    Topical--red itchy; oral--makes mouth raw Bleeding sores in mouth. Raw mouth and sores on gums     FAMILY HISTORY: Family History  Problem Relation Age of Onset   Lung cancer Mother 7   Parkinsonism Father    Neuropathy Father    Dementia Father    Heart disease Father        Father had CABG in his 74s   Colon polyps Father    Breast cancer Paternal Aunt 26   Breast cancer Paternal Grandmother 68   Arthritis/Rheumatoid Paternal Grandfather       Objective:  Blood pressure 112/74, pulse 95, height 5\' 3"  (1.6 m), weight 173 lb 12.8 oz (78.8 kg). General: No acute distress.  Patient appears well-groomed.     Shon Millet, DO  CC: Dale Crugers, MD

## 2023-05-06 ENCOUNTER — Encounter: Payer: Self-pay | Admitting: Internal Medicine

## 2023-05-06 NOTE — Assessment & Plan Note (Signed)
 Continue trulicity, farxiga and metformin. Low carb diet and exercise. Follow met b and A1c.   Lab Results  Component Value Date   HGBA1C 6.3 04/26/2023

## 2023-05-06 NOTE — Assessment & Plan Note (Signed)
 Continue risk factor modification. Continue crestor.

## 2023-05-06 NOTE — Assessment & Plan Note (Signed)
Continue nexium. No upper symptoms reported.  

## 2023-05-06 NOTE — Assessment & Plan Note (Signed)
 Continue lisinopril. Blood pressure 118/76. No changes. Follow pressures.  Follow metabolic panel.

## 2023-05-06 NOTE — Assessment & Plan Note (Addendum)
 Follow up with GI regarding GERD 04/05/23 - continues nexium 20mg  q day. Controlling upper symptoms. Recommended f/u colonoscopy. Scheduled for May. Taking iron.

## 2023-05-06 NOTE — Assessment & Plan Note (Signed)
 Continue crestor. Low cholesterol diet and exercise. Follow lipid panel and liver function tests.   Lab Results  Component Value Date   CHOL 175 04/26/2023   HDL 74.90 04/26/2023   LDLCALC 82 04/26/2023   LDLDIRECT 187.0 03/26/2021   TRIG 93.0 04/26/2023   CHOLHDL 2 04/26/2023

## 2023-05-08 ENCOUNTER — Other Ambulatory Visit (HOSPITAL_COMMUNITY): Payer: Self-pay

## 2023-05-08 ENCOUNTER — Ambulatory Visit: Payer: 59 | Admitting: Neurology

## 2023-05-08 ENCOUNTER — Other Ambulatory Visit: Payer: Self-pay

## 2023-05-08 ENCOUNTER — Encounter: Payer: Self-pay | Admitting: Neurology

## 2023-05-08 VITALS — BP 112/74 | HR 95 | Ht 63.0 in | Wt 173.8 lb

## 2023-05-08 DIAGNOSIS — G43019 Migraine without aura, intractable, without status migrainosus: Secondary | ICD-10-CM | POA: Diagnosis not present

## 2023-05-08 NOTE — Patient Instructions (Signed)
 If headache persists through the weekend, send my a message Monday and we will start topiramate.  If it resolves, continue to monitor off medication and if you should have another episode of daily headache, consider another prednisone taper (with careful sugar monitoring, pending okay with PCP) Limit use of pain relievers to no more than 2 days out of week to prevent risk of rebound or medication-overuse headache. Keep headache diary Follow up 6 months.

## 2023-05-15 ENCOUNTER — Encounter: Payer: Self-pay | Admitting: Neurology

## 2023-05-23 ENCOUNTER — Other Ambulatory Visit: Payer: Self-pay | Admitting: Neurology

## 2023-05-23 MED ORDER — TOPIRAMATE 25 MG PO TABS
25.0000 mg | ORAL_TABLET | Freq: Every day | ORAL | 5 refills | Status: DC
Start: 2023-05-23 — End: 2023-07-17

## 2023-06-12 ENCOUNTER — Encounter: Payer: Self-pay | Admitting: Neurology

## 2023-06-16 ENCOUNTER — Other Ambulatory Visit: Payer: Self-pay | Admitting: Internal Medicine

## 2023-06-21 ENCOUNTER — Ambulatory Visit: Payer: 59 | Admitting: Neurology

## 2023-07-03 ENCOUNTER — Encounter: Payer: Self-pay | Admitting: Internal Medicine

## 2023-07-03 LAB — HM DIABETES EYE EXAM

## 2023-07-03 MED ORDER — INTEGRA 62.5-62.5-40-3 MG PO CAPS
1.0000 | ORAL_CAPSULE | Freq: Every day | ORAL | 1 refills | Status: DC
  Filled 2023-07-03: qty 90, 90d supply, fill #0

## 2023-07-03 NOTE — Telephone Encounter (Signed)
 Please call her and let her know that I sent in the prescription to [facility]. I would like for her to decrease her iron (Integra) to one per day. (Instead of bid).

## 2023-07-04 ENCOUNTER — Other Ambulatory Visit (HOSPITAL_COMMUNITY): Payer: Self-pay

## 2023-07-04 NOTE — Telephone Encounter (Signed)
Leftdetailed message for patient and sent mychart

## 2023-07-05 ENCOUNTER — Other Ambulatory Visit: Payer: Self-pay

## 2023-07-05 ENCOUNTER — Other Ambulatory Visit (HOSPITAL_COMMUNITY): Payer: Self-pay

## 2023-07-06 ENCOUNTER — Other Ambulatory Visit: Payer: Self-pay

## 2023-07-16 ENCOUNTER — Encounter: Payer: Self-pay | Admitting: Neurology

## 2023-07-17 ENCOUNTER — Other Ambulatory Visit: Payer: Self-pay | Admitting: Neurology

## 2023-07-17 MED ORDER — TOPIRAMATE 50 MG PO TABS: 50.0000 mg | ORAL_TABLET | Freq: Every day | ORAL | 5 refills | Status: DC

## 2023-07-25 ENCOUNTER — Ambulatory Visit (INDEPENDENT_AMBULATORY_CARE_PROVIDER_SITE_OTHER): Payer: Self-pay

## 2023-07-25 DIAGNOSIS — Z860101 Personal history of adenomatous and serrated colon polyps: Secondary | ICD-10-CM | POA: Diagnosis not present

## 2023-07-25 DIAGNOSIS — K5730 Diverticulosis of large intestine without perforation or abscess without bleeding: Secondary | ICD-10-CM | POA: Diagnosis not present

## 2023-07-25 DIAGNOSIS — K640 First degree hemorrhoids: Secondary | ICD-10-CM | POA: Diagnosis not present

## 2023-07-25 DIAGNOSIS — Z09 Encounter for follow-up examination after completed treatment for conditions other than malignant neoplasm: Secondary | ICD-10-CM | POA: Diagnosis present

## 2023-08-01 ENCOUNTER — Other Ambulatory Visit (HOSPITAL_COMMUNITY): Payer: Self-pay

## 2023-08-05 ENCOUNTER — Other Ambulatory Visit: Payer: Self-pay | Admitting: Internal Medicine

## 2023-08-20 ENCOUNTER — Encounter: Payer: Self-pay | Admitting: Internal Medicine

## 2023-08-21 ENCOUNTER — Other Ambulatory Visit: Payer: 59

## 2023-08-21 ENCOUNTER — Other Ambulatory Visit: Payer: Self-pay

## 2023-08-21 DIAGNOSIS — D649 Anemia, unspecified: Secondary | ICD-10-CM

## 2023-08-21 DIAGNOSIS — I10 Essential (primary) hypertension: Secondary | ICD-10-CM

## 2023-08-21 DIAGNOSIS — E7800 Pure hypercholesterolemia, unspecified: Secondary | ICD-10-CM

## 2023-08-21 DIAGNOSIS — E1159 Type 2 diabetes mellitus with other circulatory complications: Secondary | ICD-10-CM

## 2023-08-21 MED ORDER — ONETOUCH ULTRA VI STRP: ORAL_STRIP | 2 refills | Status: AC

## 2023-08-21 NOTE — Addendum Note (Signed)
 Addended by: Lindle Rhea on: 08/21/2023 10:06 AM   Modules accepted: Orders

## 2023-08-22 ENCOUNTER — Other Ambulatory Visit: Payer: Self-pay | Admitting: Internal Medicine

## 2023-08-22 DIAGNOSIS — Z1231 Encounter for screening mammogram for malignant neoplasm of breast: Secondary | ICD-10-CM

## 2023-08-22 LAB — CBC WITH DIFFERENTIAL/PLATELET
Absolute Lymphocytes: 3370 {cells}/uL (ref 850–3900)
Absolute Monocytes: 704 {cells}/uL (ref 200–950)
Basophils Absolute: 62 {cells}/uL (ref 0–200)
Basophils Relative: 0.7 %
Eosinophils Absolute: 150 {cells}/uL (ref 15–500)
Eosinophils Relative: 1.7 %
HCT: 42 % (ref 35.0–45.0)
Hemoglobin: 12.9 g/dL (ref 11.7–15.5)
MCH: 28.4 pg (ref 27.0–33.0)
MCHC: 30.7 g/dL — ABNORMAL LOW (ref 32.0–36.0)
MCV: 92.5 fL (ref 80.0–100.0)
MPV: 11.8 fL (ref 7.5–12.5)
Monocytes Relative: 8 %
Neutro Abs: 4514 {cells}/uL (ref 1500–7800)
Neutrophils Relative %: 51.3 %
Platelets: 229 10*3/uL (ref 140–400)
RBC: 4.54 10*6/uL (ref 3.80–5.10)
RDW: 12.8 % (ref 11.0–15.0)
Total Lymphocyte: 38.3 %
WBC: 8.8 10*3/uL (ref 3.8–10.8)

## 2023-08-22 LAB — IRON,TIBC AND FERRITIN PANEL
%SAT: 20 % (ref 16–45)
Ferritin: 60 ng/mL (ref 16–288)
Iron: 69 ug/dL (ref 45–160)
TIBC: 343 ug/dL (ref 250–450)

## 2023-08-22 LAB — HEPATIC FUNCTION PANEL
AG Ratio: 1.9 (calc) (ref 1.0–2.5)
ALT: 13 U/L (ref 6–29)
AST: 13 U/L (ref 10–35)
Albumin: 4.4 g/dL (ref 3.6–5.1)
Alkaline phosphatase (APISO): 52 U/L (ref 37–153)
Bilirubin, Direct: 0.1 mg/dL (ref 0.0–0.2)
Globulin: 2.3 g/dL (ref 1.9–3.7)
Indirect Bilirubin: 0.3 mg/dL (ref 0.2–1.2)
Total Bilirubin: 0.4 mg/dL (ref 0.2–1.2)
Total Protein: 6.7 g/dL (ref 6.1–8.1)

## 2023-08-22 LAB — LIPID PANEL
Cholesterol: 157 mg/dL (ref <200)
HDL: 62 mg/dL (ref >=50)
LDL Cholesterol (Calc): 77 mg/dL
Non-HDL Cholesterol (Calc): 95 mg/dL (ref <130)
Total CHOL/HDL Ratio: 2.5 (calc) (ref <5.0)
Triglycerides: 99 mg/dL (ref <150)

## 2023-08-22 LAB — BASIC METABOLIC PANEL WITH GFR
BUN: 15 mg/dL (ref 7–25)
CO2: 25 mmol/L (ref 20–32)
Calcium: 9.4 mg/dL (ref 8.6–10.4)
Chloride: 105 mmol/L (ref 98–110)
Creat: 0.6 mg/dL (ref 0.50–1.05)
Glucose, Bld: 89 mg/dL (ref 65–99)
Potassium: 4.2 mmol/L (ref 3.5–5.3)
Sodium: 140 mmol/L (ref 135–146)
eGFR: 100 mL/min/{1.73_m2} (ref >=60)

## 2023-08-22 LAB — HEMOGLOBIN A1C
Hgb A1c MFr Bld: 6.3 % — ABNORMAL HIGH (ref <5.7)
Mean Plasma Glucose: 134 mg/dL
eAG (mmol/L): 7.4 mmol/L

## 2023-08-23 ENCOUNTER — Ambulatory Visit: Payer: Self-pay | Admitting: Internal Medicine

## 2023-08-23 ENCOUNTER — Ambulatory Visit
Admission: RE | Admit: 2023-08-23 | Discharge: 2023-08-23 | Disposition: A | Discharge status: Home / Self Care | Payer: Self-pay | Source: Ambulatory Visit | Attending: Internal Medicine | Admitting: Internal Medicine

## 2023-08-23 DIAGNOSIS — Z1231 Encounter for screening mammogram for malignant neoplasm of breast: Secondary | ICD-10-CM | POA: Diagnosis present

## 2023-08-24 ENCOUNTER — Ambulatory Visit: Payer: 59 | Admitting: Internal Medicine

## 2023-08-24 VITALS — BP 106/66 | HR 84 | Temp 98.0°F | Resp 16 | Ht 63.0 in | Wt 164.6 lb

## 2023-08-24 DIAGNOSIS — E1159 Type 2 diabetes mellitus with other circulatory complications: Secondary | ICD-10-CM | POA: Diagnosis not present

## 2023-08-24 DIAGNOSIS — J4530 Mild persistent asthma, uncomplicated: Secondary | ICD-10-CM | POA: Diagnosis not present

## 2023-08-24 DIAGNOSIS — E7800 Pure hypercholesterolemia, unspecified: Secondary | ICD-10-CM | POA: Diagnosis not present

## 2023-08-24 DIAGNOSIS — I10 Essential (primary) hypertension: Secondary | ICD-10-CM | POA: Diagnosis not present

## 2023-08-24 DIAGNOSIS — I25118 Atherosclerotic heart disease of native coronary artery with other forms of angina pectoris: Secondary | ICD-10-CM

## 2023-08-24 DIAGNOSIS — D649 Anemia, unspecified: Secondary | ICD-10-CM

## 2023-08-24 DIAGNOSIS — Z7984 Long term (current) use of oral hypoglycemic drugs: Secondary | ICD-10-CM

## 2023-08-24 DIAGNOSIS — K219 Gastro-esophageal reflux disease without esophagitis: Secondary | ICD-10-CM

## 2023-08-24 MED ORDER — INTEGRA 62.5-62.5-40-3 MG PO CAPS: 1.0000 | ORAL_CAPSULE | Freq: Every day | ORAL | 1 refills | Status: DC

## 2023-08-24 MED ORDER — ESCITALOPRAM OXALATE 20 MG PO TABS: ORAL_TABLET | ORAL | 1 refills | Status: DC

## 2023-08-24 MED ORDER — LISINOPRIL 20 MG PO TABS: ORAL_TABLET | ORAL | 1 refills | Status: DC

## 2023-08-24 MED ORDER — DAPAGLIFLOZIN PROPANEDIOL 10 MG PO TABS: 10.0000 mg | ORAL_TABLET | Freq: Every day | ORAL | 1 refills | Status: DC

## 2023-08-24 MED ORDER — METFORMIN HCL ER 500 MG PO TB24: 1000.0000 mg | ORAL_TABLET | Freq: Two times a day (BID) | ORAL | 5 refills | Status: DC

## 2023-08-24 NOTE — Progress Notes (Signed)
 Subjective:    Patient ID: Evelyn James, female    DOB: September 05, 1960, 63 y.o.   MRN: [id number]  Patient here for  Chief Complaint  Patient presents with   Medical Management of Chronic Issues    HPI Here for a scheduled follow up - follow up regarding diabetes, hypertension and hypercholesterolemia. Continues trulicity , farxiga  and metformin . Sees neurology - f/u migraine. Recommended to keep headache diary. Started topamax . Dose recently increased to 50mg . Headaches are better. Reports had a cold - several weeks ago. Some nasal congestion. Neck gland tender. Better. Notify if does not resolve. No chest congestion. No sob reported. No abdominal pain or bowel change reported.    Past Medical History:  Diagnosis Date   Anemia    Noted 11/2011   Anxiety    Chest pain    Admitted 11/2011: cath with mild nonobstructive coronary plaque, normal EF, negative cardiac enzymes and negative d-dimer.   Diabetes mellitus    Fatty liver disease, nonalcoholic 2014   GERD (gastroesophageal reflux disease)    History of COVID-19 06/17/2019   Hypercholesterolemia    Hypertension    Reflux    TMJ dysfunction    Past Surgical History:  Procedure Laterality Date   ABDOMINAL HYSTERECTOMY  2009   total   BILATERAL SALPINGOOPHORECTOMY  2009   benign ovarian tumor   HERNIA REPAIR     umbilical   IMAGE GUIDED SINUS SURGERY N/A 10/22/2015   Procedure: IMAGE GUIDED SINUS SURGERY;  Surgeon: Rogers Clayman, MD;  Location: [facility] ORS;  Service: ENT;  Laterality: N/A;   KNEE SURGERY Bilateral    LEFT HEART CATHETERIZATION WITH CORONARY ANGIOGRAM N/A 12/09/2011   Procedure: LEFT HEART CATHETERIZATION WITH CORONARY ANGIOGRAM;  Surgeon: Eilleen Grates, MD;  Location: [facility] CATH [facility];  Service: Cardiovascular;  Laterality: N/A;   SPHENOIDECTOMY Left 10/22/2015   Procedure: SPHENOIDECTOMY;  Surgeon: Rogers Clayman, MD;  Location: [facility] ORS;  Service: ENT;  Laterality: Left;   SUBMANDIBULAR MASS EXCISION  1996    [facility]    Family History  Problem Relation Age of Onset   Lung cancer Mother 71   Parkinsonism Father    Neuropathy Father    Dementia Father    Heart disease Father        Father had CABG in his 65s   Colon polyps Father    Breast cancer Paternal Aunt 73   Breast cancer Paternal Grandmother 71   Arthritis/Rheumatoid Paternal Grandfather    Social History   Socioeconomic History   Marital status: Married    Spouse name: Not on file   Number of children: 2   Years of education: Not on file   Highest education level: 12th grade  Occupational History   Not on file  Tobacco Use   Smoking status: Never   Smokeless tobacco: Never  Vaping Use   Vaping status: Never Used  Substance and Sexual Activity   Alcohol use: No    Alcohol/week: 0.0 standard drinks of alcohol   Drug use: No   Sexual activity: Not on file  Other Topics Concern   Not on file  Social History Narrative   Not on file   Social Drivers of Health   Financial Resource Strain: Low Risk  (08/23/2023)   Overall Financial Resource Strain (CARDIA)    Difficulty of Paying Living Expenses: Not hard at all  Food Insecurity: No Food Insecurity (08/23/2023)   Hunger Vital Sign    Worried About Running Out of Food in the  Last Year: Never true    Ran Out of Food in the Last Year: Never true  Transportation Needs: No Transportation Needs (08/23/2023)   PRAPARE - Administrator, Civil Service (Medical): No    Lack of Transportation (Non-Medical): No  Physical Activity: Inactive (08/23/2023)   Exercise Vital Sign    Days of Exercise per Week: 0 days    Minutes of Exercise per Session: 30 min  Stress: No Stress Concern Present (08/23/2023)   [employer] of Occupational Health - Occupational Stress Questionnaire    Feeling of Stress : Not at all  Social Connections: Socially Integrated (08/23/2023)   Social Connection and Isolation Panel    Frequency of Communication with Friends and Family: More than  three times a week    Frequency of Social Gatherings with Friends and Family: More than three times a week    Attends Religious Services: More than 4 times per year    Active Member of [employer] or Organizations: Yes    Attends Engineer, structural: More than 4 times per year    Marital Status: Married     Review of Systems  Constitutional:  Negative for appetite change and unexpected weight change.  HENT:  Positive for congestion. Negative for sinus pressure.   Respiratory:  Negative for cough, chest tightness and shortness of breath.   Cardiovascular:  Negative for chest pain, palpitations and leg swelling.  Gastrointestinal:  Negative for abdominal pain, diarrhea, nausea and vomiting.  Genitourinary:  Negative for difficulty urinating and dysuria.  Musculoskeletal:  Negative for joint swelling and myalgias.  Skin:  Negative for color change and rash.  Neurological:  Negative for dizziness and headaches.  Psychiatric/Behavioral:  Negative for agitation and dysphoric mood.        Objective:     BP 106/66   Pulse 84   Temp 98 F (36.7 C)   Resp 16   Ht 5' 3 (1.6 m)   Wt 164 lb 9.6 oz (74.7 kg)   SpO2 98%   BMI 29.16 kg/m  Wt Readings from Last 3 Encounters:  08/24/23 164 lb 9.6 oz (74.7 kg)  05/09/23 173 lb 12.8 oz (78.8 kg)  05/02/23 172 lb 3.2 oz (78.1 kg)    Physical Exam Vitals reviewed.  Constitutional:      General: She is not in acute distress.    Appearance: Normal appearance.  HENT:     Head: Normocephalic and atraumatic.     Right Ear: External ear normal.     Left Ear: External ear normal.     Mouth/Throat:     Pharynx: No oropharyngeal exudate or posterior oropharyngeal erythema.   Eyes:     General: No scleral icterus.       Right eye: No discharge.        Left eye: No discharge.     Conjunctiva/sclera: Conjunctivae normal.   Neck:     Thyroid : No thyromegaly.     Comments: No palpable nodule.  Cardiovascular:     Rate and Rhythm:  Normal rate and regular rhythm.  Pulmonary:     Effort: No respiratory distress.     Breath sounds: Normal breath sounds. No wheezing.  Abdominal:     General: Bowel sounds are normal.     Palpations: Abdomen is soft.     Tenderness: There is no abdominal tenderness.   Musculoskeletal:        General: No swelling or tenderness.     Cervical back:  Neck supple. No tenderness.  Lymphadenopathy:     Cervical: No cervical adenopathy.   Skin:    Findings: No erythema or rash.   Neurological:     Mental Status: She is alert.   Psychiatric:        Mood and Affect: Mood normal.        Behavior: Behavior normal.         Outpatient Encounter Medications as of 08/24/2023  Medication Sig   acetaminophen  (TYLENOL ) 500 MG tablet Take 1,000 mg by mouth every 6 (six) hours as needed for mild pain.   acyclovir  ointment (ZOVIRAX ) 5 % Apply 1 Application topically every 6 (six) hours.   Blood Glucose Monitoring Suppl (ONE TOUCH ULTRA 2) w/Device KIT U D TO CHECK BLOOD SUGARS   cetirizine (ZYRTEC) 10 MG tablet Take 10 mg by mouth daily.   cyclobenzaprine  (FLEXERIL ) 10 MG tablet 1/2 tablet q hs prn (do not take without wearing cpap)   dapagliflozin  propanediol (FARXIGA ) 10 MG TABS tablet Take 1 tablet (10 mg total) by mouth daily.   Dulaglutide  (TRULICITY ) 3 MG/0.5ML SOAJ Inject 3 mg into the skin every 7 (seven) days as directed.   escitalopram  (LEXAPRO ) 20 MG tablet TAKE 1 TABLET(20 MG) BY MOUTH DAILY   esomeprazole (NEXIUM) 20 MG capsule Take 20 mg by mouth daily at 12 noon.   estradiol  (ESTRACE ) 0.1 MG/GM vaginal cream Place 1 Applicatorful vaginally 2 (two) times a week.   famotidine (PEPCID) 40 MG tablet Take 40 mg by mouth 2 (two) times daily.   Fe Fum-FePoly-Vit C-Vit B3 (INTEGRA) 62.5-62.5-40-3 MG CAPS Take 1 capsule by mouth daily.   fluticasone  (FLONASE ) 50 MCG/ACT nasal spray Place 2 sprays into both nostrils daily.   glucose blood (ONETOUCH ULTRA) test strip USE TO CHECK BLOOD  SUGAR TWICE DAILY AS DIRECTED   levocetirizine (XYZAL) 5 MG tablet SMARTSIG:1 Tablet(s) By Mouth Every Evening   lisinopril  (ZESTRIL ) 20 MG tablet TAKE 1 TABLET(20 MG) BY MOUTH DAILY   Melatonin 10 MG TABS Take 10 mg by mouth at bedtime as needed (sleep).   metFORMIN  (GLUCOPHAGE -XR) 500 MG 24 hr tablet Take 2 tablets (1,000 mg total) by mouth 2 (two) times daily.   mometasone (ELOCON) 0.1 % cream Apply topically 2 (two) times daily.   montelukast  (SINGULAIR ) 10 MG tablet Take 1 tablet (10 mg total) by mouth at bedtime.   naproxen (NAPROSYN) 500 MG tablet Take 500 mg by mouth 2 (two) times daily.   nystatin  cream (MYCOSTATIN ) Apply 1 application topically 2 (two) times daily.   Probiotic Product (PHILLIPS COLON HEALTH PO) Take 1 tablet by mouth daily as needed (colon health).    rosuvastatin  (CRESTOR ) 20 MG tablet TAKE 1 TABLET(20 MG) BY MOUTH DAILY   topiramate  (TOPAMAX ) 50 MG tablet Take 1 tablet (50 mg total) by mouth at bedtime.   valACYclovir  (VALTREX ) 1000 MG tablet Take 1 tablet (1,000 mg total) by mouth 2 (two) times daily.   [DISCONTINUED] escitalopram  (LEXAPRO ) 20 MG tablet TAKE 1 TABLET(20 MG) BY MOUTH DAILY   [DISCONTINUED] FARXIGA  10 MG TABS tablet TAKE 1 TABLET(10 MG) BY MOUTH EVERY MORNING   [DISCONTINUED] Fe Fum-FePoly-Vit C-Vit B3 (INTEGRA) 62.5-62.5-40-3 MG CAPS Take 1 capsule by mouth daily.   [DISCONTINUED] gabapentin  (NEURONTIN ) 100 MG capsule Take 300 mg by mouth at bedtime.   [DISCONTINUED] lisinopril  (ZESTRIL ) 20 MG tablet TAKE 1 TABLET(20 MG) BY MOUTH DAILY   [DISCONTINUED] metFORMIN  (GLUCOPHAGE -XR) 500 MG 24 hr tablet TAKE 2 TABLETS BY MOUTH TWICE DAILY  No facility-administered encounter medications on file as of 08/24/2023.     Lab Results  Component Value Date   WBC 8.8 08/21/2023   HGB 12.9 08/21/2023   HCT 42.0 08/21/2023   PLT 229 08/21/2023   GLUCOSE 89 08/21/2023   CHOL 157 08/21/2023   TRIG 99 08/21/2023   HDL 62 08/21/2023   LDLDIRECT 187.0  03/26/2021   LDLCALC 77 08/21/2023   ALT 13 08/21/2023   AST 13 08/21/2023   NA 140 08/21/2023   K 4.2 08/21/2023   CL 105 08/21/2023   CREATININE 0.60 08/21/2023   BUN 15 08/21/2023   CO2 25 08/21/2023   TSH 1.89 12/07/2022   HGBA1C 6.3 (H) 08/21/2023   MICROALBUR 3.2 (H) 12/07/2022       Assessment & Plan:  Mild persistent asthma, unspecified whether complicated Assessment & Plan: Breathing stable.    Hypercholesterolemia Assessment & Plan: Continue crestor . Low cholesterol diet and exercise.  Follow lipid panel and liver function tests.  Lab Results  Component Value Date   CHOL 157 08/21/2023   HDL 62 08/21/2023   LDLCALC 77 08/21/2023   LDLDIRECT 187.0 03/26/2021   TRIG 99 08/21/2023   CHOLHDL 2.5 08/21/2023     Orders: -     Lipid panel; Future -     Hepatic function panel; Future -     TSH; Future  Primary hypertension Assessment & Plan: Continue lisinopril . Blood pressure 118/76. No changes. Follow pressures.  Follow metabolic panel.   Orders: -     Basic metabolic panel with GFR; Future  Type 2 diabetes mellitus with other circulatory complication, without long-term current use of insulin (HCC) Assessment & Plan: Continue trulicity , farxiga  and metformin . Low carb diet and exercise. Follow met b and A1c. No changes in medication  Lab Results  Component Value Date   HGBA1C 6.3 (H) 08/21/2023     Orders: -     Hemoglobin A1c; Future  Anemia, unspecified type Assessment & Plan: Hgb 08/21/23 - wnl.    Coronary artery disease involving native coronary artery of native heart with other form of angina pectoris [facility]) Assessment & Plan: Continue crestor . Continue risk factor modification.    Gastroesophageal reflux disease, unspecified whether esophagitis present Assessment & Plan: No upper symptoms reported. Continue nexium.    Other orders -     Escitalopram  Oxalate; TAKE 1 TABLET(20 MG) BY MOUTH DAILY  Dispense: 90 tablet; Refill: 1 -      Dapagliflozin  Propanediol; Take 1 tablet (10 mg total) by mouth daily.  Dispense: 90 tablet; Refill: 1 -     Integra; Take 1 capsule by mouth daily.  Dispense: 90 capsule; Refill: 1 -     Lisinopril ; TAKE 1 TABLET(20 MG) BY MOUTH DAILY  Dispense: 90 tablet; Refill: 1 -     metFORMIN  HCl ER; Take 2 tablets (1,000 mg total) by mouth 2 (two) times daily.  Dispense: 120 tablet; Refill: 5     Dellar Fenton, MD

## 2023-08-27 ENCOUNTER — Encounter: Payer: Self-pay | Admitting: Internal Medicine

## 2023-08-27 NOTE — Assessment & Plan Note (Signed)
 Continue trulicity , farxiga  and metformin . Low carb diet and exercise. Follow met b and A1c. No changes in medication  Lab Results  Component Value Date   HGBA1C 6.3 (H) 08/21/2023

## 2023-08-27 NOTE — Assessment & Plan Note (Signed)
 Breathing stable.

## 2023-08-27 NOTE — Assessment & Plan Note (Signed)
 Continue crestor . Continue risk factor modification.

## 2023-08-27 NOTE — Assessment & Plan Note (Signed)
 Continue lisinopril. Blood pressure 118/76. No changes. Follow pressures.  Follow metabolic panel.

## 2023-08-27 NOTE — Assessment & Plan Note (Signed)
 Continue crestor . Low cholesterol diet and exercise.  Follow lipid panel and liver function tests.  Lab Results  Component Value Date   CHOL 157 08/21/2023   HDL 62 08/21/2023   LDLCALC 77 08/21/2023   LDLDIRECT 187.0 03/26/2021   TRIG 99 08/21/2023   CHOLHDL 2.5 08/21/2023

## 2023-08-27 NOTE — Assessment & Plan Note (Signed)
No upper symptoms reported.  Continue nexium.  

## 2023-08-27 NOTE — Assessment & Plan Note (Signed)
 Hgb 08/21/23 - wnl.

## 2023-09-08 ENCOUNTER — Encounter: Payer: Self-pay | Admitting: Internal Medicine

## 2023-10-10 ENCOUNTER — Encounter: Payer: Self-pay | Admitting: Internal Medicine

## 2023-10-10 ENCOUNTER — Ambulatory Visit: Admitting: Internal Medicine

## 2023-10-10 VITALS — BP 102/68 | HR 85 | Resp 16 | Ht 63.0 in | Wt 157.0 lb

## 2023-10-10 DIAGNOSIS — Z7984 Long term (current) use of oral hypoglycemic drugs: Secondary | ICD-10-CM

## 2023-10-10 DIAGNOSIS — I10 Essential (primary) hypertension: Secondary | ICD-10-CM | POA: Diagnosis not present

## 2023-10-10 DIAGNOSIS — E1159 Type 2 diabetes mellitus with other circulatory complications: Secondary | ICD-10-CM | POA: Diagnosis not present

## 2023-10-10 DIAGNOSIS — M545 Low back pain, unspecified: Secondary | ICD-10-CM

## 2023-10-10 DIAGNOSIS — F439 Reaction to severe stress, unspecified: Secondary | ICD-10-CM | POA: Insufficient documentation

## 2023-10-10 DIAGNOSIS — Z7985 Long-term (current) use of injectable non-insulin antidiabetic drugs: Secondary | ICD-10-CM

## 2023-10-10 MED ORDER — CYCLOBENZAPRINE HCL 10 MG PO TABS: ORAL_TABLET | ORAL | 0 refills | Status: AC

## 2023-10-10 MED ORDER — LISINOPRIL 10 MG PO TABS: 10.0000 mg | ORAL_TABLET | Freq: Every day | ORAL | 3 refills | Status: AC

## 2023-10-10 NOTE — Assessment & Plan Note (Signed)
 Low back pain intermittent.  No pain now. prescribed refill of Flexeril  to have if needed.  Will notify me if any persistent problems.  Instructed not to drive or operate machinery with the Flexeril .

## 2023-10-10 NOTE — Assessment & Plan Note (Signed)
 Back on her Lexapro .  Feeling better.  Discussed medication.  Overall back to her baseline.  Follow.

## 2023-10-10 NOTE — Progress Notes (Signed)
 Subjective:    Patient ID: Evelyn James, female    DOB: 1960/10/30, 63 y.o.   MRN: [id number]  Patient here for  Chief Complaint  Patient presents with   Back Pain    HPI Here for work in appt - work in with concerns regarding back pain. Continues trulicity , farxiga  and metformin . Sees neurology - f/u migraine.  On questioning her she reports that she started noticing pain from her neck down to her lower back.  She saw her chiropractor.  Was doing stretches and exercise.  She then noticed becoming more emotional.  She felt like she had bugs crawling on her.  Some increased sweating.  She then realized that she had not been taking her Lexapro .  Abruptly stopped the medication.  Off for a few weeks.  She started back on the medication this past week.  Feels much better.  She is sleeping better.  No back pain now.  Feels like everything is leveling off.  Feels back to her normal.  She does request to have a refill on Flexeril  to have if needed for intermittent back flares.  Overall doing much better.  Previous dizziness and lightheadedness has near completely resolved.  Make it noticeable occasional lightheadedness now.  No chest pain or shortness of breath.   Past Medical History:  Diagnosis Date   Anemia    Noted 11/2011   Anxiety    Chest pain    Admitted 11/2011: cath with mild nonobstructive coronary plaque, normal EF, negative cardiac enzymes and negative d-dimer.   Diabetes mellitus    Fatty liver disease, nonalcoholic 2014   GERD (gastroesophageal reflux disease)    History of COVID-19 06/17/2019   Hypercholesterolemia    Hypertension    Reflux    TMJ dysfunction    Past Surgical History:  Procedure Laterality Date   ABDOMINAL HYSTERECTOMY  2009   total   BILATERAL SALPINGOOPHORECTOMY  2009   benign ovarian tumor   HERNIA REPAIR     umbilical   IMAGE GUIDED SINUS SURGERY N/A 10/22/2015   Procedure: IMAGE GUIDED SINUS SURGERY;  Surgeon: Carolee Hunter, MD;  Location: [facility]  ORS;  Service: ENT;  Laterality: N/A;   KNEE SURGERY Bilateral    LEFT HEART CATHETERIZATION WITH CORONARY ANGIOGRAM N/A 12/09/2011   Procedure: LEFT HEART CATHETERIZATION WITH CORONARY ANGIOGRAM;  Surgeon: Lynwood Schilling, MD;  Location: [facility] CATH LAB;  Service: Cardiovascular;  Laterality: N/A;   SPHENOIDECTOMY Left 10/22/2015   Procedure: SPHENOIDECTOMY;  Surgeon: Carolee Hunter, MD;  Location: [facility] ORS;  Service: ENT;  Laterality: Left;   SUBMANDIBULAR MASS EXCISION  1996   [facility]    Family History  Problem Relation Age of Onset   Lung cancer Mother 42   Parkinsonism Father    Neuropathy Father    Dementia Father    Heart disease Father        Father had CABG in his 75s   Colon polyps Father    Breast cancer Paternal Aunt 71   Breast cancer Paternal Grandmother 10   Arthritis/Rheumatoid Paternal Grandfather    Social History   Socioeconomic History   Marital status: Married    Spouse name: Not on file   Number of children: 2   Years of education: Not on file   Highest education level: 12th grade  Occupational History   Not on file  Tobacco Use   Smoking status: Never   Smokeless tobacco: Never  Vaping Use   Vaping status: Never Used  Substance and Sexual Activity   Alcohol use: No    Alcohol/week: 0.0 standard drinks of alcohol   Drug use: No   Sexual activity: Not on file  Other Topics Concern   Not on file  Social History Narrative   Not on file   Social Drivers of Health   Financial Resource Strain: Low Risk  (10/03/2023)   Overall Financial Resource Strain (CARDIA)    Difficulty of Paying Living Expenses: Not hard at all  Food Insecurity: No Food Insecurity (10/03/2023)   Hunger Vital Sign    Worried About Running Out of Food in the Last Year: Never true    Ran Out of Food in the Last Year: Never true  Transportation Needs: No Transportation Needs (10/03/2023)   PRAPARE - Administrator, Civil Service (Medical): No    Lack of Transportation  (Non-Medical): No  Physical Activity: Inactive (10/03/2023)   Exercise Vital Sign    Days of Exercise per Week: 0 days    Minutes of Exercise per Session: Not on file  Stress: No Stress Concern Present (10/03/2023)   [employer] of Occupational Health - Occupational Stress Questionnaire    Feeling of Stress: Not at all  Social Connections: Socially Integrated (10/03/2023)   Social Connection and Isolation Panel    Frequency of Communication with Friends and Family: More than three times a week    Frequency of Social Gatherings with Friends and Family: More than three times a week    Attends Religious Services: More than 4 times per year    Active Member of [employer] or Organizations: Yes    Attends Engineer, structural: More than 4 times per year    Marital Status: Married     Review of Systems  Constitutional:  Negative for appetite change and unexpected weight change.  HENT:  Negative for congestion and sinus pressure.   Respiratory:  Negative for cough, chest tightness and shortness of breath.   Cardiovascular:  Negative for chest pain, palpitations and leg swelling.  Gastrointestinal:  Negative for abdominal pain, diarrhea, nausea and vomiting.  Genitourinary:  Negative for difficulty urinating and dysuria.  Musculoskeletal:  Negative for joint swelling and myalgias.  Skin:  Negative for color change and rash.  Neurological:  Negative for headaches.       Occasional light headedness.   Psychiatric/Behavioral:  Negative for agitation and dysphoric mood.        Objective:     BP 102/68   Pulse 85   Resp 16   Ht 5' 3 (1.6 m)   Wt 157 lb (71.2 kg)   SpO2 98%   BMI 27.81 kg/m  Wt Readings from Last 3 Encounters:  10/10/23 157 lb (71.2 kg)  08/24/23 164 lb 9.6 oz (74.7 kg)  05/09/23 173 lb 12.8 oz (78.8 kg)    Physical Exam Vitals reviewed.  Constitutional:      General: She is not in acute distress.    Appearance: Normal appearance.  HENT:     Head:  Normocephalic and atraumatic.     Right Ear: External ear normal.     Left Ear: External ear normal.     Mouth/Throat:     Pharynx: No oropharyngeal exudate or posterior oropharyngeal erythema.  Eyes:     General: No scleral icterus.       Right eye: No discharge.        Left eye: No discharge.     Conjunctiva/sclera: Conjunctivae normal.  Neck:  Thyroid : No thyromegaly.  Cardiovascular:     Rate and Rhythm: Normal rate and regular rhythm.  Pulmonary:     Effort: No respiratory distress.     Breath sounds: Normal breath sounds. No wheezing.  Abdominal:     General: Bowel sounds are normal.     Palpations: Abdomen is soft.     Tenderness: There is no abdominal tenderness.  Musculoskeletal:        General: No swelling or tenderness.     Cervical back: Neck supple. No tenderness.  Lymphadenopathy:     Cervical: No cervical adenopathy.  Skin:    Findings: No erythema or rash.  Neurological:     Mental Status: She is alert.  Psychiatric:        Mood and Affect: Mood normal.        Behavior: Behavior normal.         Outpatient Encounter Medications as of 10/10/2023  Medication Sig   lisinopril  (ZESTRIL ) 10 MG tablet Take 1 tablet (10 mg total) by mouth daily.   acetaminophen  (TYLENOL ) 500 MG tablet Take 1,000 mg by mouth every 6 (six) hours as needed for mild pain.   acyclovir  ointment (ZOVIRAX ) 5 % Apply 1 Application topically every 6 (six) hours.   Blood Glucose Monitoring Suppl (ONE TOUCH ULTRA 2) w/Device KIT U D TO CHECK BLOOD SUGARS   cetirizine (ZYRTEC) 10 MG tablet Take 10 mg by mouth daily.   cyclobenzaprine  (FLEXERIL ) 10 MG tablet 1/2 tablet q hs prn (do not take without wearing cpap)   dapagliflozin  propanediol (FARXIGA ) 10 MG TABS tablet Take 1 tablet (10 mg total) by mouth daily.   Dulaglutide  (TRULICITY ) 3 MG/0.5ML SOAJ Inject 3 mg into the skin every 7 (seven) days as directed.   escitalopram  (LEXAPRO ) 20 MG tablet TAKE 1 TABLET(20 MG) BY MOUTH DAILY    esomeprazole (NEXIUM) 20 MG capsule Take 20 mg by mouth daily at 12 noon.   estradiol  (ESTRACE ) 0.1 MG/GM vaginal cream Place 1 Applicatorful vaginally 2 (two) times a week.   famotidine (PEPCID) 40 MG tablet Take 40 mg by mouth 2 (two) times daily.   Fe Fum-FePoly-Vit C-Vit B3 (INTEGRA) 62.5-62.5-40-3 MG CAPS Take 1 capsule by mouth daily.   fluticasone  (FLONASE ) 50 MCG/ACT nasal spray Place 2 sprays into both nostrils daily.   glucose blood (ONETOUCH ULTRA) test strip USE TO CHECK BLOOD SUGAR TWICE DAILY AS DIRECTED   levocetirizine (XYZAL) 5 MG tablet SMARTSIG:1 Tablet(s) By Mouth Every Evening   Melatonin 10 MG TABS Take 10 mg by mouth at bedtime as needed (sleep).   metFORMIN  (GLUCOPHAGE -XR) 500 MG 24 hr tablet Take 2 tablets (1,000 mg total) by mouth 2 (two) times daily.   mometasone (ELOCON) 0.1 % cream Apply topically 2 (two) times daily.   montelukast  (SINGULAIR ) 10 MG tablet Take 1 tablet (10 mg total) by mouth at bedtime.   naproxen (NAPROSYN) 500 MG tablet Take 500 mg by mouth 2 (two) times daily.   nystatin  cream (MYCOSTATIN ) Apply 1 application topically 2 (two) times daily.   Probiotic Product (PHILLIPS COLON HEALTH PO) Take 1 tablet by mouth daily as needed (colon health).    rosuvastatin  (CRESTOR ) 20 MG tablet TAKE 1 TABLET(20 MG) BY MOUTH DAILY   topiramate  (TOPAMAX ) 50 MG tablet Take 1 tablet (50 mg total) by mouth at bedtime.   valACYclovir  (VALTREX ) 1000 MG tablet Take 1 tablet (1,000 mg total) by mouth 2 (two) times daily.   [DISCONTINUED] cyclobenzaprine  (FLEXERIL ) 10 MG tablet 1/2  tablet q hs prn (do not take without wearing cpap)   [DISCONTINUED] lisinopril  (ZESTRIL ) 20 MG tablet TAKE 1 TABLET(20 MG) BY MOUTH DAILY   No facility-administered encounter medications on file as of 10/10/2023.     Lab Results  Component Value Date   WBC 8.8 08/21/2023   HGB 12.9 08/21/2023   HCT 42.0 08/21/2023   PLT 229 08/21/2023   GLUCOSE 89 08/21/2023   CHOL 157 08/21/2023   TRIG  99 08/21/2023   HDL 62 08/21/2023   LDLDIRECT 187.0 03/26/2021   LDLCALC 77 08/21/2023   ALT 13 08/21/2023   AST 13 08/21/2023   NA 140 08/21/2023   K 4.2 08/21/2023   CL 105 08/21/2023   CREATININE 0.60 08/21/2023   BUN 15 08/21/2023   CO2 25 08/21/2023   TSH 1.89 12/07/2022   HGBA1C 6.3 (H) 08/21/2023    MM 3D SCREENING MAMMOGRAM BILATERAL BREAST Result Date: 08/24/2023 CLINICAL DATA:  Screening. EXAM: DIGITAL SCREENING BILATERAL MAMMOGRAM WITH TOMOSYNTHESIS AND CAD TECHNIQUE: Bilateral screening digital craniocaudal and mediolateral oblique mammograms were obtained. Bilateral screening digital breast tomosynthesis was performed. The images were evaluated with computer-aided detection. COMPARISON:  Previous exam(s). ACR Breast Density Category b: There are scattered areas of fibroglandular density. FINDINGS: There are no findings suspicious for malignancy. IMPRESSION: No mammographic evidence of malignancy. A result letter of this screening mammogram will be mailed directly to the patient. RECOMMENDATION: Screening mammogram in one year. (Code:SM-B-01Y) BI-RADS CATEGORY  1: Negative. Electronically Signed   By: Craig Farr M.D.   On: 08/24/2023 15:00       Assessment & Plan:  Primary hypertension Assessment & Plan: Blood pressure as outlined.  Occasional lightheadedness.  Given low pressure we will decrease lisinopril  to 10 mg daily.  Follow pressures..   Type 2 diabetes mellitus with other circulatory complication, without long-term current use of insulin (HCC) Assessment & Plan: Continue trulicity , farxiga  and metformin . No changes in medication  Lab Results  Component Value Date   HGBA1C 6.3 (H) 08/21/2023      Stress Assessment & Plan: Back on her Lexapro .  Feeling better.  Discussed medication.  Overall back to her baseline.  Follow.   Low back pain, unspecified back pain laterality, unspecified chronicity, unspecified whether sciatica present Assessment &  Plan: Low back pain intermittent.  No pain now. prescribed refill of Flexeril  to have if needed.  Will notify me if any persistent problems.  Instructed not to drive or operate machinery with the Flexeril .    Other orders -     Lisinopril ; Take 1 tablet (10 mg total) by mouth daily.  Dispense: 90 tablet; Refill: 3 -     Cyclobenzaprine  HCl; 1/2 tablet q hs prn (do not take without wearing cpap)  Dispense: 20 tablet; Refill: 0     Allena Hamilton, MD

## 2023-10-10 NOTE — Assessment & Plan Note (Signed)
 Blood pressure as outlined.  Occasional lightheadedness.  Given low pressure we will decrease lisinopril  to 10 mg daily.  Follow pressures.SABRA

## 2023-10-10 NOTE — Assessment & Plan Note (Signed)
 Continue trulicity , farxiga  and metformin . No changes in medication  Lab Results  Component Value Date   HGBA1C 6.3 (H) 08/21/2023

## 2023-10-17 NOTE — Progress Notes (Unsigned)
 NEUROLOGY FOLLOW UP OFFICE NOTE  Evelyn James [id number]  Assessment/Plan:   Migraine without aura, with status migrainosus, intractable. Interestingly, recurred in February, just like last year.  Often at night.  However, symptoms not consistent with cluster headache.   Headache prevention:  topiramate  50mg  at bedtime *** Headache rescue:  *** Limit use of pain relievers to no more than 9 days out of the month to prevent risk of rebound or medication-overuse headache Keep headache diary Follow up 8 months.      Subjective:  Evelyn James is a 63 year old right-handed female with HTN, DM 2, non-alcoholic fatty liver disease, asthma, hypercholesterolemia, anxiety, TMJ dysfunction and reflux who follows up for migraines.  UPDATE: Due to ongoing headache that started on 1/31, she was started on topiramate .  ***   Current NSAIDS/analgesics:  acetaminophen , Excedrin (rarely), naproxen 500mg  (knee pain) Current triptans:  none Current ergotamine:  none Current anti-emetic:  none Current muscle relaxants:  Flexeril  Current Antihypertensive medications:  lisinopril   Current Antidepressant medications:  Lexapro  20mg  daily Current Anticonvulsant medications:  topiramate  50mg  at bedtime Current anti-CGRP:  none Current Vitamins/Herbal/Supplements:  melatonin 10mg  at bedtime, Integra Current Antihistamines/Decongestants:  Zyrtec, Flonase  Other therapy:  none   Caffeine:  2 cups coffee every morning.  Sometimes tea or diet cola Diet:  Water and juice.  Does not usually skip meals.  Light lunch. Exercise:  swims 30 minutes a day 5 days a week Depression:  no; Anxiety:  stable Other pain:  knee pain Sleep hygiene:  sometimes difficulty falling asleep. May fall asleep after midnight but will sleep until 8 AM.  Uses melatonin if needed  HISTORY: She has had history of intermittent headaches for many years.  They are a 4/10 pressure headache that starts across the forehead and  spreads to vertex.  Associated with nausea and photophobia but no vomiting, phonophobia or visual disturbance.  Typically would occur at night when she goes to bed and would have resolved when she wakes up in the morning.  Usually occurs once a week.  May treat with ibuprofen.  She also has headaches described as stabbing over either eyebrow or throbbing on the side of her head.  No associated autonomic symptoms.    Around 2017, headaches became worse, more severe and pounding.  This went on for about a year until she saw ENT was found to have a fungal sinus infection.  Began experiencing sinus pressure and nasal congestion in January 2024.  Treated with ormnicef and nasal sprays.  Symptoms subsided.  She then developed one of her habitual headache on 04/17/2022 which was persistent and lasted a week.  MRI of brain with and without contrast on 05/02/2022 personally reviewed was unremarkable.  Afterwards, they resorted to occurring once a week again.    In late 2024-early 2025, she began experiencing a stabbing pain on the eyebrow or temple.  No autonomic symptoms.    Past NSAIDS/analgesics:  none Past abortive triptans:  none Past abortive ergotamine:  none Past muscle relaxants:  none Past anti-emetic:  none Past antihypertensive medications:  none Past antidepressant medications:  none Past anticonvulsant medications:  none Past anti-CGRP:  none Past vitamins/Herbal/Supplements:  magnesium  oxide Past antihistamines/decongestants:  none Other past therapies:  prednisone  taper    Family history of headache:  mother, maternal grandfather  PAST MEDICAL HISTORY: Past Medical History:  Diagnosis Date   Anemia    Noted 11/2011   Anxiety    Chest pain  Admitted 11/2011: cath with mild nonobstructive coronary plaque, normal EF, negative cardiac enzymes and negative d-dimer.   Diabetes mellitus    Fatty liver disease, nonalcoholic 2014   GERD (gastroesophageal reflux disease)    History of  COVID-19 06/17/2019   Hypercholesterolemia    Hypertension    Reflux    TMJ dysfunction     MEDICATIONS: Current Outpatient Medications on File Prior to Visit  Medication Sig Dispense Refill   acetaminophen  (TYLENOL ) 500 MG tablet Take 1,000 mg by mouth every 6 (six) hours as needed for mild pain.     acyclovir  ointment (ZOVIRAX ) 5 % Apply 1 Application topically every 6 (six) hours. 15 g 0   Blood Glucose Monitoring Suppl (ONE TOUCH ULTRA 2) w/Device KIT U D TO CHECK BLOOD SUGARS  0   cetirizine (ZYRTEC) 10 MG tablet Take 10 mg by mouth daily.     cyclobenzaprine  (FLEXERIL ) 10 MG tablet 1/2 tablet q hs prn (do not take without wearing cpap) 20 tablet 0   dapagliflozin  propanediol (FARXIGA ) 10 MG TABS tablet Take 1 tablet (10 mg total) by mouth daily. 90 tablet 1   Dulaglutide  (TRULICITY ) 3 MG/0.5ML SOAJ Inject 3 mg into the skin every 7 (seven) days as directed. 6 mL 1   escitalopram  (LEXAPRO ) 20 MG tablet TAKE 1 TABLET(20 MG) BY MOUTH DAILY 90 tablet 1   esomeprazole (NEXIUM) 20 MG capsule Take 20 mg by mouth daily at 12 noon.     estradiol  (ESTRACE ) 0.1 MG/GM vaginal cream Place 1 Applicatorful vaginally 2 (two) times a week.     famotidine (PEPCID) 40 MG tablet Take 40 mg by mouth 2 (two) times daily.     Fe Fum-FePoly-Vit C-Vit B3 (INTEGRA) 62.5-62.5-40-3 MG CAPS Take 1 capsule by mouth daily. 90 capsule 1   fluticasone  (FLONASE ) 50 MCG/ACT nasal spray Place 2 sprays into both nostrils daily. 16 g 5   glucose blood (ONETOUCH ULTRA) test strip USE TO CHECK BLOOD SUGAR TWICE DAILY AS DIRECTED 100 strip 2   levocetirizine (XYZAL) 5 MG tablet SMARTSIG:1 Tablet(s) By Mouth Every Evening     lisinopril  (ZESTRIL ) 10 MG tablet Take 1 tablet (10 mg total) by mouth daily. 90 tablet 3   Melatonin 10 MG TABS Take 10 mg by mouth at bedtime as needed (sleep).     metFORMIN  (GLUCOPHAGE -XR) 500 MG 24 hr tablet Take 2 tablets (1,000 mg total) by mouth 2 (two) times daily. 120 tablet 5   mometasone  (ELOCON) 0.1 % cream Apply topically 2 (two) times daily.     montelukast  (SINGULAIR ) 10 MG tablet Take 1 tablet (10 mg total) by mouth at bedtime. 30 tablet 4   naproxen (NAPROSYN) 500 MG tablet Take 500 mg by mouth 2 (two) times daily.     nystatin  cream (MYCOSTATIN ) Apply 1 application topically 2 (two) times daily. 30 g 0   Probiotic Product (PHILLIPS COLON HEALTH PO) Take 1 tablet by mouth daily as needed (colon health).      rosuvastatin  (CRESTOR ) 20 MG tablet TAKE 1 TABLET(20 MG) BY MOUTH DAILY 90 tablet 1   topiramate  (TOPAMAX ) 50 MG tablet Take 1 tablet (50 mg total) by mouth at bedtime. 30 tablet 5   valACYclovir  (VALTREX ) 1000 MG tablet Take 1 tablet (1,000 mg total) by mouth 2 (two) times daily. 21 tablet 0   No current facility-administered medications on file prior to visit.    ALLERGIES: Allergies  Allergen Reactions   Sulfa Antibiotics Other (See Comments)  Topical--red itchy; oral--makes mouth raw Bleeding sores in mouth. Raw mouth and sores on gums     FAMILY HISTORY: Family History  Problem Relation Age of Onset   Lung cancer Mother 73   Parkinsonism Father    Neuropathy Father    Dementia Father    Heart disease Father        Father had CABG in his 45s   Colon polyps Father    Breast cancer Paternal Aunt 74   Breast cancer Paternal Grandmother 59   Arthritis/Rheumatoid Paternal Grandfather       Objective:  *** General: No acute distress.  Patient appears well-groomed.   Head:  Normocephalic/atraumatic Neck:  Supple.  No paraspinal tenderness.  Full range of motion. Heart:  Regular rate and rhythm. Neuro:  Alert and oriented.  Speech fluent and not dysarthric.  Language intact.  CN II-XII intact.  Bulk and tone normal.  Muscle strength 5/5 throughout.  Sensation to light touch intact.  Deep tendon reflexes 2+ throughout, toes downgoing.  Gait normal.  Romberg negative.   Juliene Dunnings, DO  CC: Allena Hamilton, MD

## 2023-10-18 ENCOUNTER — Encounter: Payer: Self-pay | Admitting: Neurology

## 2023-10-18 ENCOUNTER — Ambulatory Visit: Admitting: Neurology

## 2023-10-18 VITALS — BP 95/56 | HR 85 | Ht 63.0 in | Wt 161.0 lb

## 2023-10-18 DIAGNOSIS — G43009 Migraine without aura, not intractable, without status migrainosus: Secondary | ICD-10-CM

## 2023-10-30 ENCOUNTER — Other Ambulatory Visit (HOSPITAL_COMMUNITY): Payer: Self-pay

## 2023-10-30 ENCOUNTER — Other Ambulatory Visit: Payer: Self-pay

## 2023-10-30 ENCOUNTER — Other Ambulatory Visit: Payer: Self-pay | Admitting: Internal Medicine

## 2023-10-30 DIAGNOSIS — E1159 Type 2 diabetes mellitus with other circulatory complications: Secondary | ICD-10-CM

## 2023-10-30 MED ORDER — TRULICITY 3 MG/0.5ML ~~LOC~~ SOAJ
3.0000 mg | SUBCUTANEOUS | 1 refills | Status: DC
  Filled 2023-10-30: qty 6, 84d supply, fill #0
  Filled 2024-01-18: qty 6, 84d supply, fill #1

## 2023-11-07 ENCOUNTER — Ambulatory Visit: Payer: 59 | Admitting: Neurology

## 2023-11-15 ENCOUNTER — Other Ambulatory Visit (HOSPITAL_BASED_OUTPATIENT_CLINIC_OR_DEPARTMENT_OTHER): Payer: Self-pay

## 2023-11-28 ENCOUNTER — Ambulatory Visit: Admitting: Internal Medicine

## 2023-12-28 ENCOUNTER — Other Ambulatory Visit: Payer: Self-pay | Admitting: Internal Medicine

## 2023-12-29 ENCOUNTER — Ambulatory Visit
Admission: RE | Admit: 2023-12-29 | Discharge: 2023-12-29 | Disposition: A | Discharge status: Home / Self Care | Source: Ambulatory Visit | Attending: Emergency Medicine | Admitting: Emergency Medicine

## 2023-12-29 VITALS — BP 106/70 | HR 88 | Temp 98.0°F | Resp 14 | Ht 63.0 in | Wt 153.0 lb

## 2023-12-29 DIAGNOSIS — N3000 Acute cystitis without hematuria: Secondary | ICD-10-CM

## 2023-12-29 LAB — URINALYSIS, W/ REFLEX TO CULTURE (INFECTION SUSPECTED)
Bilirubin Urine: NEGATIVE
Glucose, UA: 500 mg/dL — AB
Hgb urine dipstick: NEGATIVE
Ketones, ur: NEGATIVE mg/dL
Leukocytes,Ua: NEGATIVE
Nitrite: NEGATIVE
Protein, ur: NEGATIVE mg/dL
Specific Gravity, Urine: 1.015 (ref 1.005–1.030)
pH: 6.5 (ref 5.0–8.0)

## 2023-12-29 MED ORDER — PHENAZOPYRIDINE HCL 200 MG PO TABS: 200.0000 mg | ORAL_TABLET | Freq: Three times a day (TID) | ORAL | 0 refills | Status: AC | PRN

## 2023-12-29 MED ORDER — NITROFURANTOIN MONOHYD MACRO 100 MG PO CAPS: 100.0000 mg | ORAL_CAPSULE | Freq: Two times a day (BID) | ORAL | 0 refills | Status: AC

## 2023-12-29 NOTE — ED Provider Notes (Signed)
 HPI  SUBJECTIVE:  Evelyn James is a 63 y.o. female who presents with 3 days of dysuria, urgency, frequency, lower midline abdominal cramping/pressure after urination, nausea.  No cloudy or odorous urine, hematuria, fevers, vomiting, back pain, vaginal complaints.  She is not sexually active.  No recent antibiotics.  She took Tylenol  within the past 6 hours.  She has also been taking Azo with improvement in her symptoms.  Symptoms are worse with urinating.  She has a past history of diabetes, hypertension, UTI, BV, yeast.  She uses vaginal estrogen for dryness.  No history of pyelonephritis, nephrolithiasis, chronic disease.  PCP: [facility].  Has an appointment with them in 1 week    Past Medical History:  Diagnosis Date   Anemia    Noted 11/2011   Anxiety    Chest pain    Admitted 11/2011: cath with mild nonobstructive coronary plaque, normal EF, negative cardiac enzymes and negative d-dimer.   Diabetes mellitus    Fatty liver disease, nonalcoholic 2014   GERD (gastroesophageal reflux disease)    History of COVID-19 06/17/2019   Hypercholesterolemia    Hypertension    Reflux    TMJ dysfunction     Past Surgical History:  Procedure Laterality Date   ABDOMINAL HYSTERECTOMY  2009   total   BILATERAL SALPINGOOPHORECTOMY  2009   benign ovarian tumor   HERNIA REPAIR     umbilical   IMAGE GUIDED SINUS SURGERY N/A 10/22/2015   Procedure: IMAGE GUIDED SINUS SURGERY;  Surgeon: Carolee Hunter, MD;  Location: [facility] ORS;  Service: ENT;  Laterality: N/A;   KNEE SURGERY Bilateral    LEFT HEART CATHETERIZATION WITH CORONARY ANGIOGRAM N/A 12/09/2011   Procedure: LEFT HEART CATHETERIZATION WITH CORONARY ANGIOGRAM;  Surgeon: Lynwood Schilling, MD;  Location: [facility] CATH LAB;  Service: Cardiovascular;  Laterality: N/A;   SPHENOIDECTOMY Left 10/22/2015   Procedure: SPHENOIDECTOMY;  Surgeon: Carolee Hunter, MD;  Location: [facility] ORS;  Service: ENT;  Laterality: Left;   SUBMANDIBULAR MASS EXCISION   1996   [facility]     Family History  Problem Relation Age of Onset   Lung cancer Mother 80   Parkinsonism Father    Neuropathy Father    Dementia Father    Heart disease Father        Father had CABG in his 50s   Colon polyps Father    Breast cancer Paternal Aunt 57   Breast cancer Paternal Grandmother 67   Arthritis/Rheumatoid Paternal Grandfather     Social History   Tobacco Use   Smoking status: Never   Smokeless tobacco: Never  Vaping Use   Vaping status: Never Used  Substance Use Topics   Alcohol use: No    Alcohol/week: 0.0 standard drinks of alcohol   Drug use: No    No current facility-administered medications for this encounter.  Current Outpatient Medications:    nitrofurantoin , macrocrystal-monohydrate, (MACROBID ) 100 MG capsule, Take 1 capsule (100 mg total) by mouth 2 (two) times daily for 5 days., Disp: 10 capsule, Rfl: 0   phenazopyridine (PYRIDIUM) 200 MG tablet, Take 1 tablet (200 mg total) by mouth 3 (three) times daily as needed for pain., Disp: 6 tablet, Rfl: 0   acetaminophen  (TYLENOL ) 500 MG tablet, Take 1,000 mg by mouth every 6 (six) hours as needed for mild pain., Disp: , Rfl:    acyclovir  ointment (ZOVIRAX ) 5 %, Apply 1 Application topically every 6 (six) hours., Disp: 15 g, Rfl: 0   Blood Glucose Monitoring Suppl (  ONE TOUCH ULTRA 2) w/Device KIT, U D TO CHECK BLOOD SUGARS, Disp: , Rfl: 0   cetirizine (ZYRTEC) 10 MG tablet, Take 10 mg by mouth daily., Disp: , Rfl:    cyclobenzaprine  (FLEXERIL ) 10 MG tablet, 1/2 tablet q hs prn (do not take without wearing cpap) (Patient taking differently: Take 10 mg by mouth as needed. 1/2 tablet q hs prn (do not take without wearing cpap)), Disp: 20 tablet, Rfl: 0   dapagliflozin  propanediol (FARXIGA ) 10 MG TABS tablet, Take 1 tablet (10 mg total) by mouth daily., Disp: 90 tablet, Rfl: 1   Dulaglutide  (TRULICITY ) 3 MG/0.5ML SOAJ, Inject 3 mg into the skin every 7 (seven) days as directed., Disp: 6 mL, Rfl: 1    escitalopram  (LEXAPRO ) 20 MG tablet, TAKE 1 TABLET(20 MG) BY MOUTH DAILY, Disp: 90 tablet, Rfl: 1   esomeprazole (NEXIUM) 20 MG capsule, Take 20 mg by mouth daily at 12 noon., Disp: , Rfl:    estradiol  (ESTRACE ) 0.1 MG/GM vaginal cream, Place 1 Applicatorful vaginally 2 (two) times a week., Disp: , Rfl:    famotidine (PEPCID) 40 MG tablet, Take 40 mg by mouth 2 (two) times daily., Disp: , Rfl:    Fe Fum-FePoly-Vit C-Vit B3 (INTEGRA) 62.5-62.5-40-3 MG CAPS, Take 1 capsule by mouth daily., Disp: 90 capsule, Rfl: 1   fluticasone  (FLONASE ) 50 MCG/ACT nasal spray, Place 2 sprays into both nostrils daily., Disp: 16 g, Rfl: 5   glucose blood (ONETOUCH ULTRA) test strip, USE TO CHECK BLOOD SUGAR TWICE DAILY AS DIRECTED, Disp: 100 strip, Rfl: 2   levocetirizine (XYZAL) 5 MG tablet, SMARTSIG:1 Tablet(s) By Mouth Every Evening, Disp: , Rfl:    lisinopril  (ZESTRIL ) 10 MG tablet, Take 1 tablet (10 mg total) by mouth daily., Disp: 90 tablet, Rfl: 3   Melatonin 10 MG TABS, Take 10 mg by mouth at bedtime as needed (sleep)., Disp: , Rfl:    metFORMIN  (GLUCOPHAGE -XR) 500 MG 24 hr tablet, Take 2 tablets (1,000 mg total) by mouth 2 (two) times daily., Disp: 120 tablet, Rfl: 5   mometasone (ELOCON) 0.1 % cream, Apply topically 2 (two) times daily., Disp: , Rfl:    montelukast  (SINGULAIR ) 10 MG tablet, Take 1 tablet (10 mg total) by mouth at bedtime., Disp: 30 tablet, Rfl: 4   naproxen (NAPROSYN) 500 MG tablet, Take 500 mg by mouth 2 (two) times daily., Disp: , Rfl:    nystatin  cream (MYCOSTATIN ), Apply 1 application topically 2 (two) times daily., Disp: 30 g, Rfl: 0   Probiotic Product (PHILLIPS COLON HEALTH PO), Take 1 tablet by mouth daily as needed (colon health).  (Patient not taking: Reported on 10/18/2023), Disp: , Rfl:    rosuvastatin  (CRESTOR ) 20 MG tablet, TAKE 1 TABLET(20 MG) BY MOUTH DAILY, Disp: 90 tablet, Rfl: 1   topiramate  (TOPAMAX ) 50 MG tablet, Take 1 tablet (50 mg total) by mouth at bedtime., Disp: 30  tablet, Rfl: 5   valACYclovir  (VALTREX ) 1000 MG tablet, Take 1 tablet (1,000 mg total) by mouth 2 (two) times daily., Disp: 21 tablet, Rfl: 0  Allergies  Allergen Reactions   Sulfa Antibiotics Other (See Comments)    Topical--red itchy; oral--makes mouth raw Bleeding sores in mouth. Raw mouth and sores on gums      ROS  As noted in HPI.   Physical Exam  BP 106/70 (BP Location: Left Arm)   Pulse 88   Temp 98 F (36.7 C) (Oral)   Resp 14   Ht 5' 3 (1.6 m)  Wt 69.4 kg   SpO2 95%   BMI 27.10 kg/m   Constitutional: Well developed, well nourished, no acute distress Eyes:  EOMI, conjunctiva normal bilaterally HENT: Normocephalic, atraumatic,mucus membranes moist Respiratory: Normal inspiratory effort Cardiovascular: Normal rate GI: nondistended.  Positive suprapubic tenderness.  No flank tenderness bilaterally. Back: No CVAT skin: No rash, skin intact Musculoskeletal: no deformities Neurologic: Alert & oriented x 3, no focal neuro deficits Psychiatric: Speech and behavior appropriate   ED Course   Medications - No data to display  Orders Placed This Encounter  Procedures   Urine Culture    Standing Status:   Standing    Number of Occurrences:   1    Indication:   Dysuria   Urinalysis, w/ Reflex to Culture (Infection Suspected) -Urine, Clean Catch    Standing Status:   Standing    Number of Occurrences:   1    Specimen Source:   Urine, Clean Catch [[id number]]    Results for orders placed or performed during the hospital encounter of 12/29/23 (from the past 24 hours)  Urinalysis, w/ Reflex to Culture (Infection Suspected) -Urine, Clean Catch     Status: Abnormal   Collection Time: 12/29/23  2:38 PM  Result Value Ref Range   Specimen Source URINE, CLEAN CATCH    Color, Urine YELLOW YELLOW   APPearance CLEAR CLEAR   Specific Gravity, Urine 1.015 1.005 - 1.030   pH 6.5 5.0 - 8.0   Glucose, UA 500 (A) NEGATIVE mg/dL   Hgb urine dipstick NEGATIVE NEGATIVE    Bilirubin Urine NEGATIVE NEGATIVE   Ketones, ur NEGATIVE NEGATIVE mg/dL   Protein, ur NEGATIVE NEGATIVE mg/dL   Nitrite NEGATIVE NEGATIVE   Leukocytes,Ua NEGATIVE NEGATIVE   Squamous Epithelial / HPF 11-20 0 - 5 /HPF   WBC, UA 0-5 0 - 5 WBC/hpf   RBC / HPF 0-5 0 - 5 RBC/hpf   Bacteria, UA RARE (A) NONE SEEN   No results found.  ED Clinical Impression  1. Acute cystitis without hematuria      ED Assessment/Plan    UA contaminated.  Positive glucose, but patient takes farxiga .  Rare bacteria.  No yeast.  Will send this off for culture.  hsitory consistent with a urinary tract infection.  Patient declined swab for testing for BV and yeast.  Atrophic vaginitis/vaginitis in the differential, but I would not expect it to cause lower abdominal pain and pressure after urinating.  Will go ahead and start treating with Pyridium and Macrobid .  Creatinine clearance 104 mL/min in June 2025.  Discussed labs, MDM, treatment plan, and plan for follow-up with patient. Discussed sn/sx that should prompt return to the ED. patient agrees with plan.   Meds ordered this encounter  Medications   nitrofurantoin , macrocrystal-monohydrate, (MACROBID ) 100 MG capsule    Sig: Take 1 capsule (100 mg total) by mouth 2 (two) times daily for 5 days.    Dispense:  10 capsule    Refill:  0   phenazopyridine (PYRIDIUM) 200 MG tablet    Sig: Take 1 tablet (200 mg total) by mouth 3 (three) times daily as needed for pain.    Dispense:  6 tablet    Refill:  0      *This clinic note was created using Scientist, clinical (histocompatibility and immunogenetics). Therefore, there may be occasional mistakes despite careful proofreading.  ?    Van Knee, MD 12/31/23 1218

## 2023-12-29 NOTE — ED Triage Notes (Signed)
 Patient c/o urinary frequency and urgency that started on Tuesday.  Patient denies dysuria.

## 2023-12-29 NOTE — Discharge Instructions (Signed)
 I have sent your urine off for culture to make sure that we have you on the right antibiotic.  Finish the Macrobid , even if you feel better.  Pyridium will turn your urine orange, but will help with your symptoms.  Make sure to drink plenty of extra fluids

## 2023-12-30 LAB — URINE CULTURE: Culture: <10000 — AB

## 2024-01-01 ENCOUNTER — Other Ambulatory Visit

## 2024-01-01 DIAGNOSIS — E7800 Pure hypercholesterolemia, unspecified: Secondary | ICD-10-CM

## 2024-01-01 DIAGNOSIS — E1159 Type 2 diabetes mellitus with other circulatory complications: Secondary | ICD-10-CM | POA: Diagnosis not present

## 2024-01-01 DIAGNOSIS — I10 Essential (primary) hypertension: Secondary | ICD-10-CM

## 2024-01-01 LAB — BASIC METABOLIC PANEL WITH GFR
BUN: 16 mg/dL (ref 6–23)
CO2: 29 meq/L (ref 19–32)
Calcium: 9.5 mg/dL (ref 8.4–10.5)
Chloride: 103 meq/L (ref 96–112)
Creatinine, Ser: 0.54 mg/dL (ref 0.40–1.20)
GFR: 97.01 mL/min (ref >60.00)
Glucose, Bld: 87 mg/dL (ref 70–99)
Potassium: 4.5 meq/L (ref 3.5–5.1)
Sodium: 141 meq/L (ref 135–145)

## 2024-01-01 LAB — HEPATIC FUNCTION PANEL
ALT: 10 U/L (ref 0–35)
AST: 10 U/L (ref 0–37)
Albumin: 4.5 g/dL (ref 3.5–5.2)
Alkaline Phosphatase: 47 U/L (ref 39–117)
Bilirubin, Direct: 0.1 mg/dL (ref 0.0–0.3)
Total Bilirubin: 0.4 mg/dL (ref 0.2–1.2)
Total Protein: 6.5 g/dL (ref 6.0–8.3)

## 2024-01-01 LAB — HEMOGLOBIN A1C: Hgb A1c MFr Bld: 6 % (ref 4.6–6.5)

## 2024-01-01 LAB — LIPID PANEL
Cholesterol: 147 mg/dL (ref 0–200)
HDL: 57.1 mg/dL (ref >39.00)
LDL Cholesterol: 69 mg/dL (ref 0–99)
NonHDL: 89.89
Total CHOL/HDL Ratio: 3
Triglycerides: 106 mg/dL (ref 0.0–149.0)
VLDL: 21.2 mg/dL (ref 0.0–40.0)

## 2024-01-01 LAB — TSH: TSH: 2.37 u[IU]/mL (ref 0.35–5.50)

## 2024-01-02 ENCOUNTER — Ambulatory Visit (HOSPITAL_COMMUNITY): Payer: Self-pay

## 2024-01-02 ENCOUNTER — Ambulatory Visit: Payer: Self-pay | Admitting: Internal Medicine

## 2024-01-05 ENCOUNTER — Ambulatory Visit: Admitting: Internal Medicine

## 2024-01-05 VITALS — BP 104/60 | HR 88 | Temp 98.4°F | Ht 63.0 in | Wt 153.2 lb

## 2024-01-05 DIAGNOSIS — Z23 Encounter for immunization: Secondary | ICD-10-CM | POA: Diagnosis not present

## 2024-01-05 DIAGNOSIS — I10 Essential (primary) hypertension: Secondary | ICD-10-CM

## 2024-01-05 DIAGNOSIS — E7800 Pure hypercholesterolemia, unspecified: Secondary | ICD-10-CM

## 2024-01-05 DIAGNOSIS — Z0000 Encounter for general adult medical examination without abnormal findings: Secondary | ICD-10-CM

## 2024-01-05 DIAGNOSIS — J4530 Mild persistent asthma, uncomplicated: Secondary | ICD-10-CM

## 2024-01-05 DIAGNOSIS — R8290 Unspecified abnormal findings in urine: Secondary | ICD-10-CM

## 2024-01-05 DIAGNOSIS — I25118 Atherosclerotic heart disease of native coronary artery with other forms of angina pectoris: Secondary | ICD-10-CM

## 2024-01-05 DIAGNOSIS — E1159 Type 2 diabetes mellitus with other circulatory complications: Secondary | ICD-10-CM

## 2024-01-05 DIAGNOSIS — H9201 Otalgia, right ear: Secondary | ICD-10-CM

## 2024-01-05 DIAGNOSIS — F439 Reaction to severe stress, unspecified: Secondary | ICD-10-CM

## 2024-01-05 DIAGNOSIS — K219 Gastro-esophageal reflux disease without esophagitis: Secondary | ICD-10-CM

## 2024-01-05 MED ORDER — DAPAGLIFLOZIN PROPANEDIOL 10 MG PO TABS: 10.0000 mg | ORAL_TABLET | Freq: Every day | ORAL | 1 refills | Status: AC

## 2024-01-05 MED ORDER — ROSUVASTATIN CALCIUM 20 MG PO TABS: ORAL_TABLET | ORAL | 1 refills | Status: AC

## 2024-01-05 MED ORDER — INTEGRA 62.5-62.5-40-3 MG PO CAPS: 1.0000 | ORAL_CAPSULE | Freq: Every day | ORAL | 1 refills | Status: AC

## 2024-01-05 MED ORDER — METFORMIN HCL ER 500 MG PO TB24: 1000.0000 mg | ORAL_TABLET | Freq: Two times a day (BID) | ORAL | 5 refills | Status: AC

## 2024-01-05 NOTE — Progress Notes (Signed)
 Subjective:    Patient ID: Evelyn James, female    DOB: [birth year], 63 y.o.   MRN: [id number]  Patient here for  Chief Complaint  Patient presents with   Annual Exam    HPI Here for a physical exam. Evaluated 12/29/23 - concern regarding UTI. Treated with pyridium and macrobid . Symptoms improved. Reports she is doing relatively well. Increased stress - husband's medical issues. Overall appears to be handling things relatively well. Breathing stable. Does report right ear discomfort. Started a few days ago. No fever. No increased sinus pressure. Some drainage. No chest congestion. No sob. No vomiting or diarrhea. Started using flonase  last night. Feels some better today.    Past Medical History:  Diagnosis Date   Anemia    Noted 11/2011   Anxiety    Chest pain    Admitted 11/2011: cath with mild nonobstructive coronary plaque, normal EF, negative cardiac enzymes and negative d-dimer.   Diabetes mellitus    Fatty liver disease, nonalcoholic 2014   GERD (gastroesophageal reflux disease)    History of COVID-19 06/17/2019   Hypercholesterolemia    Hypertension    Reflux    TMJ dysfunction    Past Surgical History:  Procedure Laterality Date   ABDOMINAL HYSTERECTOMY  2009   total   BILATERAL SALPINGOOPHORECTOMY  2009   benign ovarian tumor   HERNIA REPAIR     umbilical   IMAGE GUIDED SINUS SURGERY N/A 10/22/2015   Procedure: IMAGE GUIDED SINUS SURGERY;  Surgeon: Carolee Hunter, MD;  Location: [facility] ORS;  Service: ENT;  Laterality: N/A;   KNEE SURGERY Bilateral    LEFT HEART CATHETERIZATION WITH CORONARY ANGIOGRAM N/A 12/09/2011   Procedure: LEFT HEART CATHETERIZATION WITH CORONARY ANGIOGRAM;  Surgeon: Lynwood Schilling, MD;  Location: [facility] CATH LAB;  Service: Cardiovascular;  Laterality: N/A;   SPHENOIDECTOMY Left 10/22/2015   Procedure: SPHENOIDECTOMY;  Surgeon: Carolee Hunter, MD;  Location: [facility] ORS;  Service: ENT;  Laterality: Left;   SUBMANDIBULAR MASS EXCISION  1996   [facility]     Family History  Problem Relation Age of Onset   Lung cancer Mother 75   Parkinsonism Father    Neuropathy Father    Dementia Father    Heart disease Father        Father had CABG in his 39s   Colon polyps Father    Breast cancer Paternal Aunt 61   Breast cancer Paternal Grandmother 70   Arthritis/Rheumatoid Paternal Grandfather    Social History   Socioeconomic History   Marital status: Married    Spouse name: Not on file   Number of children: 2   Years of education: Not on file   Highest education level: 12th grade  Occupational History   Not on file  Tobacco Use   Smoking status: Never   Smokeless tobacco: Never  Vaping Use   Vaping status: Never Used  Substance and Sexual Activity   Alcohol use: No    Alcohol/week: 0.0 standard drinks of alcohol   Drug use: No   Sexual activity: Not on file  Other Topics Concern   Not on file  Social History Narrative   Not on file   Social Drivers of Health   Financial Resource Strain: Low Risk  (01/03/2024)   Overall Financial Resource Strain (CARDIA)    Difficulty of Paying Living Expenses: Not hard at all  Food Insecurity: No Food Insecurity (01/03/2024)   Hunger Vital Sign    Worried About Running Out of Food in  the Last Year: Never true    Ran Out of Food in the Last Year: Never true  Transportation Needs: No Transportation Needs (01/03/2024)   PRAPARE - Administrator, Civil Service (Medical): No    Lack of Transportation (Non-Medical): No  Physical Activity: Inactive (01/03/2024)   Exercise Vital Sign    Days of Exercise per Week: 0 days    Minutes of Exercise per Session: Not on file  Stress: Patient Declined (01/03/2024)   [employer] of Occupational Health - Occupational Stress Questionnaire    Feeling of Stress: Patient declined  Social Connections: Socially Integrated (01/03/2024)   Social Connection and Isolation Panel    Frequency of Communication with Friends and Family: More than  three times a week    Frequency of Social Gatherings with Friends and Family: More than three times a week    Attends Religious Services: More than 4 times per year    Active Member of [employer] or Organizations: Yes    Attends Engineer, Structural: More than 4 times per year    Marital Status: Married     Review of Systems  Constitutional:  Negative for appetite change and unexpected weight change.  HENT:  Positive for postnasal drip. Negative for congestion, sinus pressure and sore throat.        Left ear discomfort as outlined.   Eyes:  Negative for pain and visual disturbance.  Respiratory:  Negative for cough, chest tightness and shortness of breath.   Cardiovascular:  Negative for chest pain, palpitations and leg swelling.  Gastrointestinal:  Negative for abdominal pain, diarrhea, nausea and vomiting.  Genitourinary:  Negative for difficulty urinating and dysuria.  Musculoskeletal:  Negative for joint swelling and myalgias.  Skin:  Negative for color change and rash.  Neurological:  Negative for dizziness and headaches.  Hematological:  Negative for adenopathy. Does not bruise/bleed easily.  Psychiatric/Behavioral:  Negative for agitation and dysphoric mood.        Objective:     BP 104/60   Pulse 88   Temp 98.4 F (36.9 C)   Ht 5' 3 (1.6 m)   Wt 153 lb 3.2 oz (69.5 kg)   SpO2 97%   BMI 27.14 kg/m  Wt Readings from Last 3 Encounters:  01/05/24 153 lb 3.2 oz (69.5 kg)  12/29/23 153 lb (69.4 kg)  10/18/23 161 lb (73 kg)    Physical Exam Vitals reviewed.  Constitutional:      General: She is not in acute distress.    Appearance: Normal appearance. She is well-developed.  HENT:     Head: Normocephalic and atraumatic.     Right Ear: Tympanic membrane, ear canal and external ear normal. There is no impacted cerumen.     Left Ear: Tympanic membrane, ear canal and external ear normal. There is no impacted cerumen.     Mouth/Throat:     Pharynx: No  oropharyngeal exudate or posterior oropharyngeal erythema.  Eyes:     General: No scleral icterus.       Right eye: No discharge.        Left eye: No discharge.     Conjunctiva/sclera: Conjunctivae normal.  Neck:     Thyroid : No thyromegaly.  Cardiovascular:     Rate and Rhythm: Normal rate and regular rhythm.  Pulmonary:     Effort: No tachypnea, accessory muscle usage or respiratory distress.     Breath sounds: Normal breath sounds. No decreased breath sounds or wheezing.  Chest:  Breasts:    Right: No inverted nipple, mass, nipple discharge or tenderness (no axillary adenopathy).     Left: No inverted nipple, mass, nipple discharge or tenderness (no axilarry adenopathy).  Abdominal:     General: Bowel sounds are normal.     Palpations: Abdomen is soft.     Tenderness: There is no abdominal tenderness.  Musculoskeletal:        General: No swelling or tenderness.     Cervical back: Neck supple.  Lymphadenopathy:     Cervical: No cervical adenopathy.  Skin:    Findings: No erythema or rash.  Neurological:     Mental Status: She is alert and oriented to person, place, and time.  Psychiatric:        Mood and Affect: Mood normal.        Behavior: Behavior normal.         Outpatient Encounter Medications as of 01/05/2024  Medication Sig   acetaminophen  (TYLENOL ) 500 MG tablet Take 1,000 mg by mouth every 6 (six) hours as needed for mild pain.   acyclovir  ointment (ZOVIRAX ) 5 % Apply 1 Application topically every 6 (six) hours.   Blood Glucose Monitoring Suppl (ONE TOUCH ULTRA 2) w/Device KIT U D TO CHECK BLOOD SUGARS   cetirizine (ZYRTEC) 10 MG tablet Take 10 mg by mouth daily.   cyclobenzaprine  (FLEXERIL ) 10 MG tablet 1/2 tablet q hs prn (do not take without wearing cpap) (Patient taking differently: Take 10 mg by mouth as needed. 1/2 tablet q hs prn (do not take without wearing cpap))   Dulaglutide  (TRULICITY ) 3 MG/0.5ML SOAJ Inject 3 mg into the skin every 7 (seven) days  as directed.   escitalopram  (LEXAPRO ) 20 MG tablet TAKE 1 TABLET(20 MG) BY MOUTH DAILY   esomeprazole (NEXIUM) 20 MG capsule Take 20 mg by mouth daily at 12 noon.   estradiol  (ESTRACE ) 0.1 MG/GM vaginal cream Place 1 Applicatorful vaginally 2 (two) times a week.   famotidine (PEPCID) 40 MG tablet Take 40 mg by mouth 2 (two) times daily.   fluticasone  (FLONASE ) 50 MCG/ACT nasal spray Place 2 sprays into both nostrils daily.   glucose blood (ONETOUCH ULTRA) test strip USE TO CHECK BLOOD SUGAR TWICE DAILY AS DIRECTED   levocetirizine (XYZAL) 5 MG tablet SMARTSIG:1 Tablet(s) By Mouth Every Evening   lisinopril  (ZESTRIL ) 10 MG tablet Take 1 tablet (10 mg total) by mouth daily.   Melatonin 10 MG TABS Take 10 mg by mouth at bedtime as needed (sleep).   mometasone (ELOCON) 0.1 % cream Apply topically 2 (two) times daily.   montelukast  (SINGULAIR ) 10 MG tablet Take 1 tablet (10 mg total) by mouth at bedtime.   naproxen (NAPROSYN) 500 MG tablet Take 500 mg by mouth 2 (two) times daily.   nystatin  cream (MYCOSTATIN ) Apply 1 application topically 2 (two) times daily.   Olopatadine HCl 0.2 % SOLN 1 drop into affected eye Ophthalmic Once a day; Duration: 30 days   phenazopyridine (PYRIDIUM) 200 MG tablet Take 1 tablet (200 mg total) by mouth 3 (three) times daily as needed for pain.   topiramate  (TOPAMAX ) 50 MG tablet Take 1 tablet (50 mg total) by mouth at bedtime.   dapagliflozin  propanediol (FARXIGA ) 10 MG TABS tablet Take 1 tablet (10 mg total) by mouth daily.   Fe Fum-FePoly-Vit C-Vit B3 (INTEGRA) 62.5-62.5-40-3 MG CAPS Take 1 capsule by mouth daily.   metFORMIN  (GLUCOPHAGE -XR) 500 MG 24 hr tablet Take 2 tablets (1,000 mg total) by mouth 2 (two) times  daily.   rosuvastatin  (CRESTOR ) 20 MG tablet TAKE 1 TABLET(20 MG) BY MOUTH DAILY   [DISCONTINUED] dapagliflozin  propanediol (FARXIGA ) 10 MG TABS tablet Take 1 tablet (10 mg total) by mouth daily.   [DISCONTINUED] Fe Fum-FePoly-Vit C-Vit B3 (INTEGRA)  62.5-62.5-40-3 MG CAPS Take 1 capsule by mouth daily.   [DISCONTINUED] metFORMIN  (GLUCOPHAGE -XR) 500 MG 24 hr tablet Take 2 tablets (1,000 mg total) by mouth 2 (two) times daily.   [DISCONTINUED] Probiotic Product (PHILLIPS COLON HEALTH PO) Take 1 tablet by mouth daily as needed (colon health).  (Patient not taking: Reported on 10/18/2023)   [DISCONTINUED] rosuvastatin  (CRESTOR ) 20 MG tablet TAKE 1 TABLET(20 MG) BY MOUTH DAILY   [DISCONTINUED] valACYclovir  (VALTREX ) 1000 MG tablet Take 1 tablet (1,000 mg total) by mouth 2 (two) times daily.   No facility-administered encounter medications on file as of 01/05/2024.     Lab Results  Component Value Date   WBC 8.8 08/21/2023   HGB 12.9 08/21/2023   HCT 42.0 08/21/2023   PLT 229 08/21/2023   GLUCOSE 87 01/01/2024   CHOL 147 01/01/2024   TRIG 106.0 01/01/2024   HDL 57.10 01/01/2024   LDLDIRECT 187.0 03/26/2021   LDLCALC 69 01/01/2024   ALT 10 01/01/2024   AST 10 01/01/2024   NA 141 01/01/2024   K 4.5 01/01/2024   CL 103 01/01/2024   CREATININE 0.54 01/01/2024   BUN 16 01/01/2024   CO2 29 01/01/2024   TSH 2.37 01/01/2024   HGBA1C 6.0 01/01/2024       Assessment & Plan:  Routine general medical examination at a health care facility  Hypercholesterolemia Assessment & Plan: Continue crestor . Low cholesterol diet and exercise.  Follow lipid panel and liver function tests.  Lab Results  Component Value Date   CHOL 147 01/01/2024   HDL 57.10 01/01/2024   LDLCALC 69 01/01/2024   LDLDIRECT 187.0 03/26/2021   TRIG 106.0 01/01/2024   CHOLHDL 3 01/01/2024     Orders: -     Lipid panel; Future -     Hepatic function panel; Future  Primary hypertension Assessment & Plan: Blood pressure as outlined. Continues on lisinopril . Follow pressures. Follow metabolic panel.   Orders: -     Basic metabolic panel with GFR; Future  Type 2 diabetes mellitus with other circulatory complication, without long-term current use of insulin  (HCC) Assessment & Plan: Continue trulicity , farxiga  and metformin . Reviewed recent labs. Continue low carb diet and exercise.  Lab Results  Component Value Date   HGBA1C 6.0 01/01/2024     Orders: -     Hemoglobin A1c; Future -     Microalbumin / creatinine urine ratio; Future  Abnormal urine  Annual physical exam  Healthcare maintenance Assessment & Plan: Physical today 01/05/24.  Colonoscopy 05/2018 - recommended f/u colonoscopy in 5 years.  Colonoscopy 07/2023 - diverticulosis and internal hemorrhoids. Recommended f/u colonoscopy in 10 years. Mammogram 08/23/23 - Birads I.  PAP 12/03/21 - negative with negative HPV (atrophy)   Need for influenza vaccination -     Flu vaccine trivalent PF, 6mos and older(Flulaval,Afluria,Fluarix,Fluzone)  Stress Assessment & Plan: Continue lexapro . Reports handling things relatively well. Does not feel needs any further intervention. Follow.    Gastroesophageal reflux disease, unspecified whether esophagitis present Assessment & Plan: Continues on nexium. No upper symptoms reported.    Coronary artery disease involving native coronary artery of native heart with other form of angina pectoris Assessment & Plan: Continue crestor . Continue risk factor modification. Currently without symptoms  Mild persistent asthma, unspecified whether complicated Assessment & Plan: Breathing stable.    Right ear pain Assessment & Plan: No erythema. Canal clear. Started flonase  yesterday. Some better today. Continue flonase . Denies pain - TMJ. Follow.    Other orders -     Dapagliflozin  Propanediol; Take 1 tablet (10 mg total) by mouth daily.  Dispense: 90 tablet; Refill: 1 -     Integra; Take 1 capsule by mouth daily.  Dispense: 90 capsule; Refill: 1 -     metFORMIN  HCl ER; Take 2 tablets (1,000 mg total) by mouth 2 (two) times daily.  Dispense: 120 tablet; Refill: 5 -     Rosuvastatin  Calcium ; TAKE 1 TABLET(20 MG) BY MOUTH DAILY  Dispense: 90  tablet; Refill: 1     Allena Hamilton, MD

## 2024-01-05 NOTE — Assessment & Plan Note (Signed)
 Physical today 01/05/24.  Colonoscopy 05/2018 - recommended f/u colonoscopy in 5 years.  Colonoscopy 07/2023 - diverticulosis and internal hemorrhoids. Recommended f/u colonoscopy in 10 years. Mammogram 08/23/23 - Birads I.  PAP 12/03/21 - negative with negative HPV (atrophy)

## 2024-01-06 ENCOUNTER — Encounter: Payer: Self-pay | Admitting: Internal Medicine

## 2024-01-06 DIAGNOSIS — H9201 Otalgia, right ear: Secondary | ICD-10-CM | POA: Insufficient documentation

## 2024-01-06 IMAGING — MG MM DIGITAL SCREENING BILAT W/ TOMO AND CAD
6 of 12 series · 6 of 36 positions shown · non-contrast
Comparison: Previous exam(s).

ACR Breast Density Category a: The breast tissue is almost entirely
fatty.

CLINICAL DATA: Screening.

EXAM:
DIGITAL SCREENING BILATERAL MAMMOGRAM WITH TOMOSYNTHESIS AND CAD
TECHNIQUE: Bilateral screening digital craniocaudal and mediolateral oblique
mammograms were obtained. Bilateral screening digital breast
tomosynthesis was performed. The images were evaluated with
computer-aided detection.

[R CC synth-2D (1 of 2)]
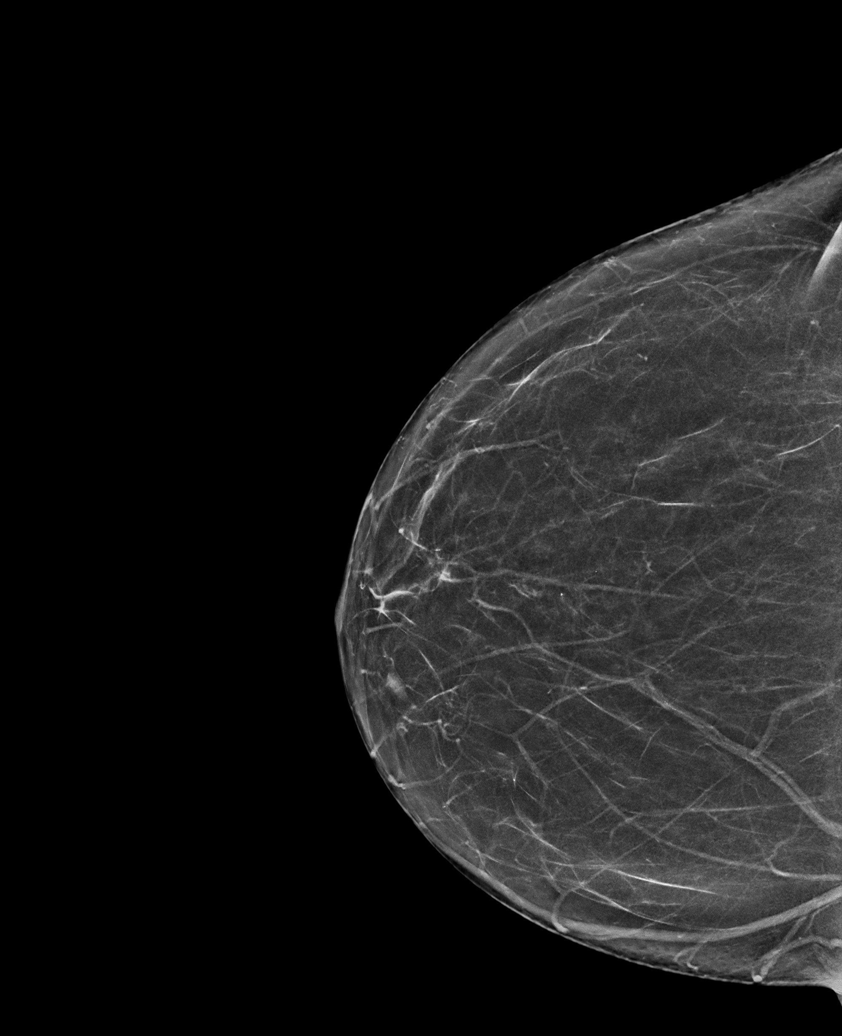

[R MLO synth-2D (1 of 2)]
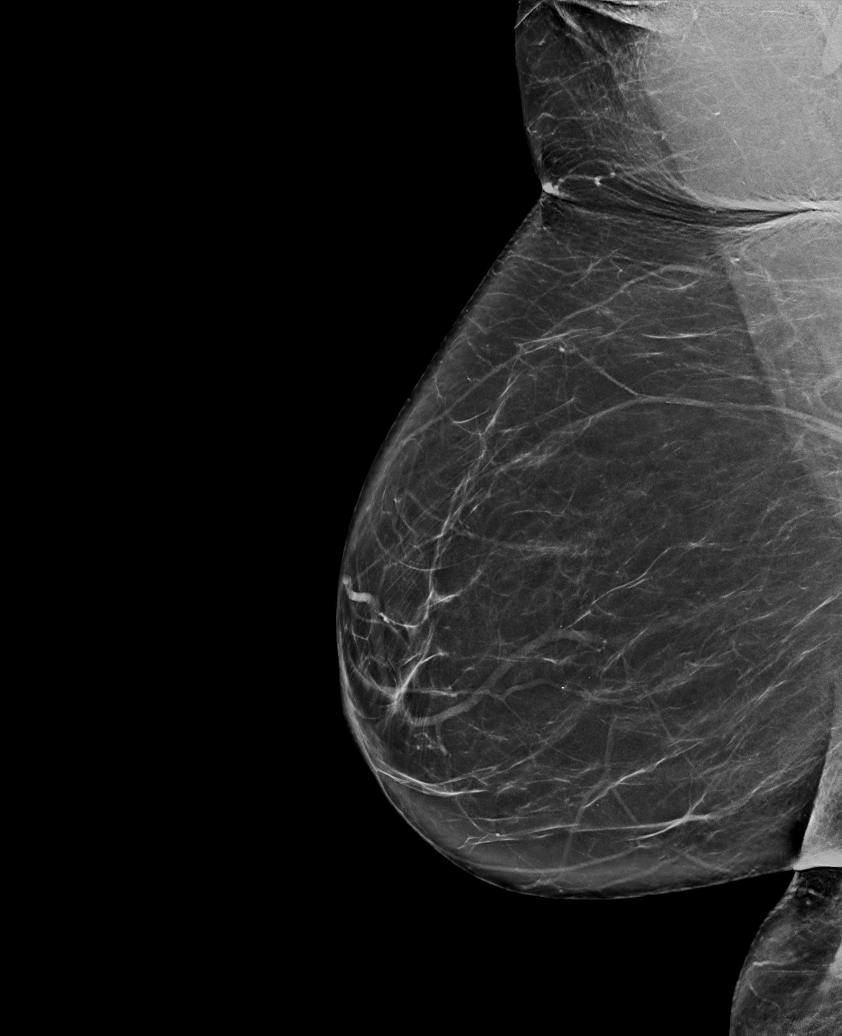

[L CC synth-2D]
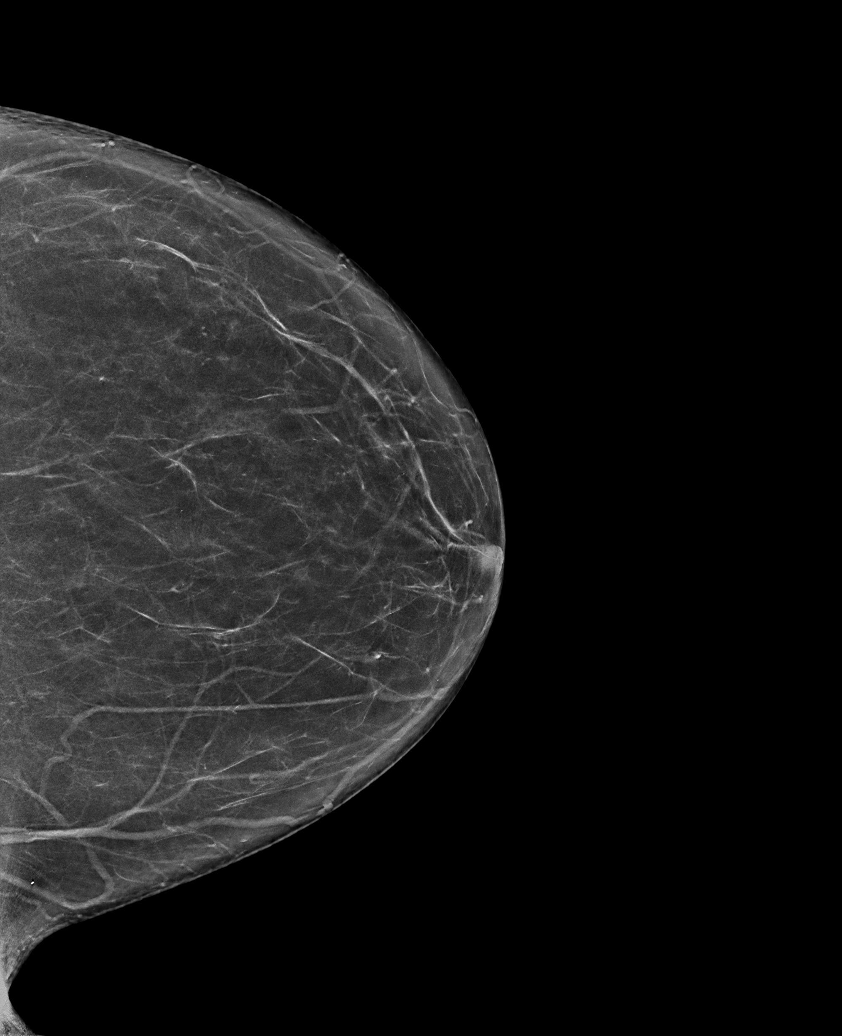

[R MLO synth-2D (2 of 2)]
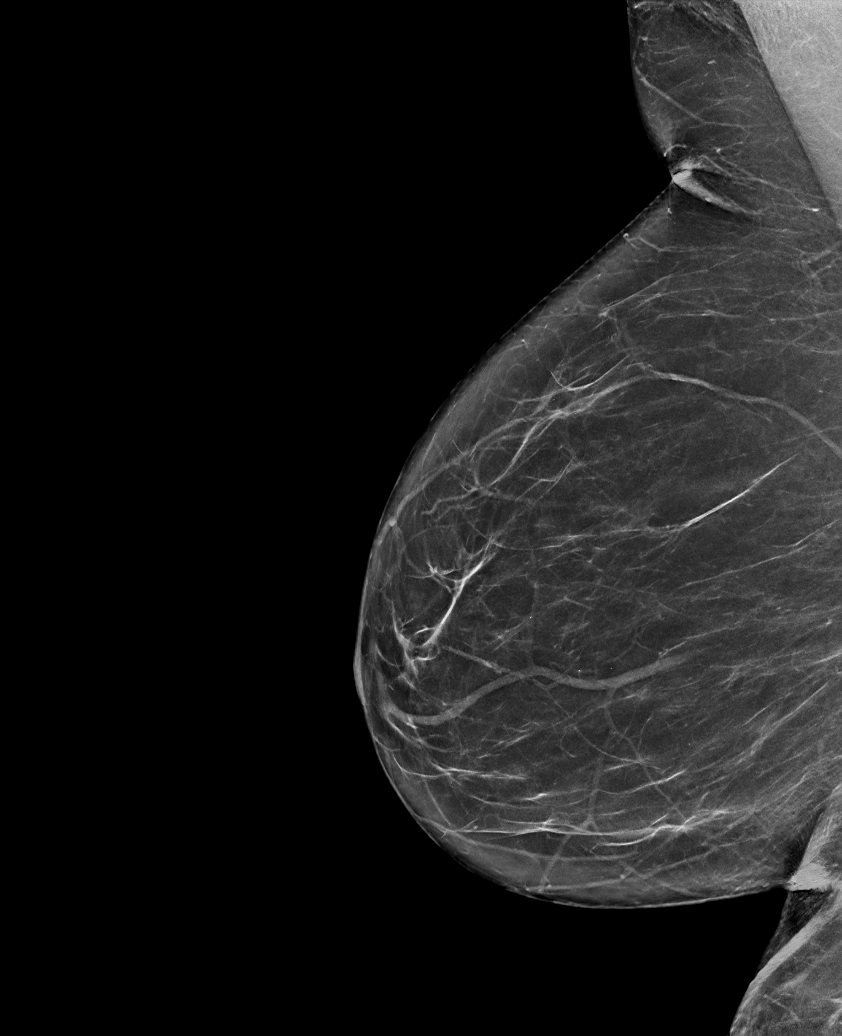

[R CC synth-2D (2 of 2)]
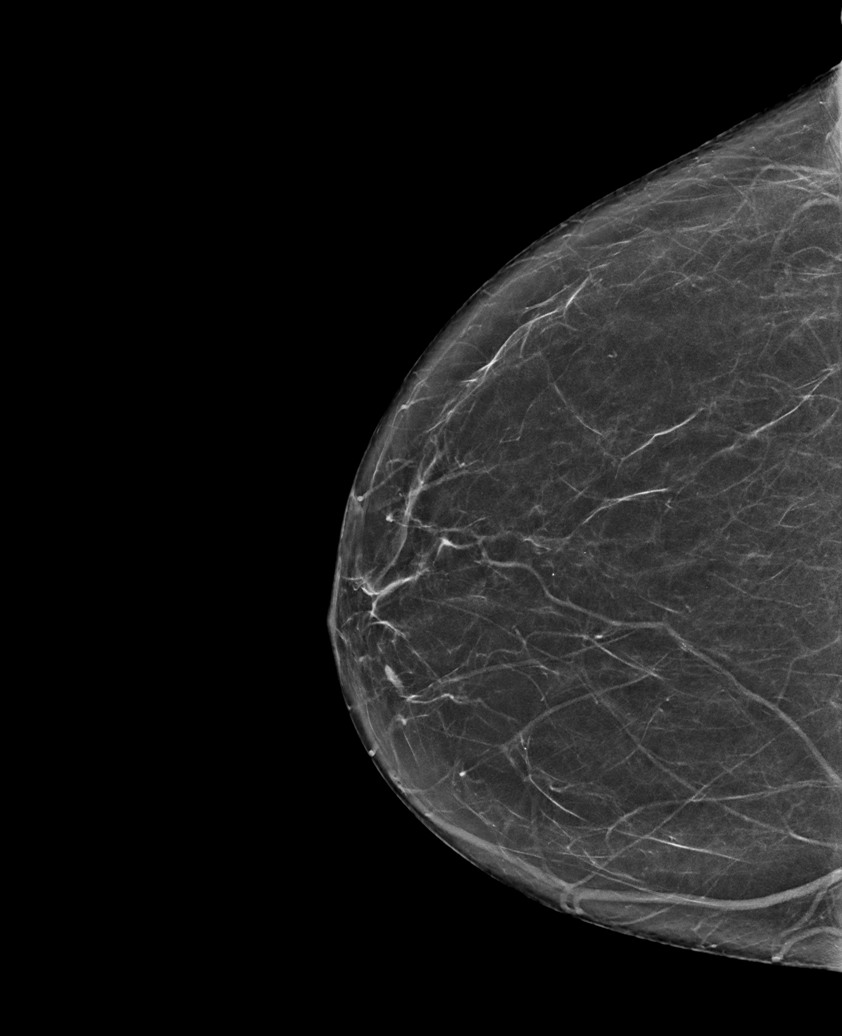

[L MLO synth-2D]
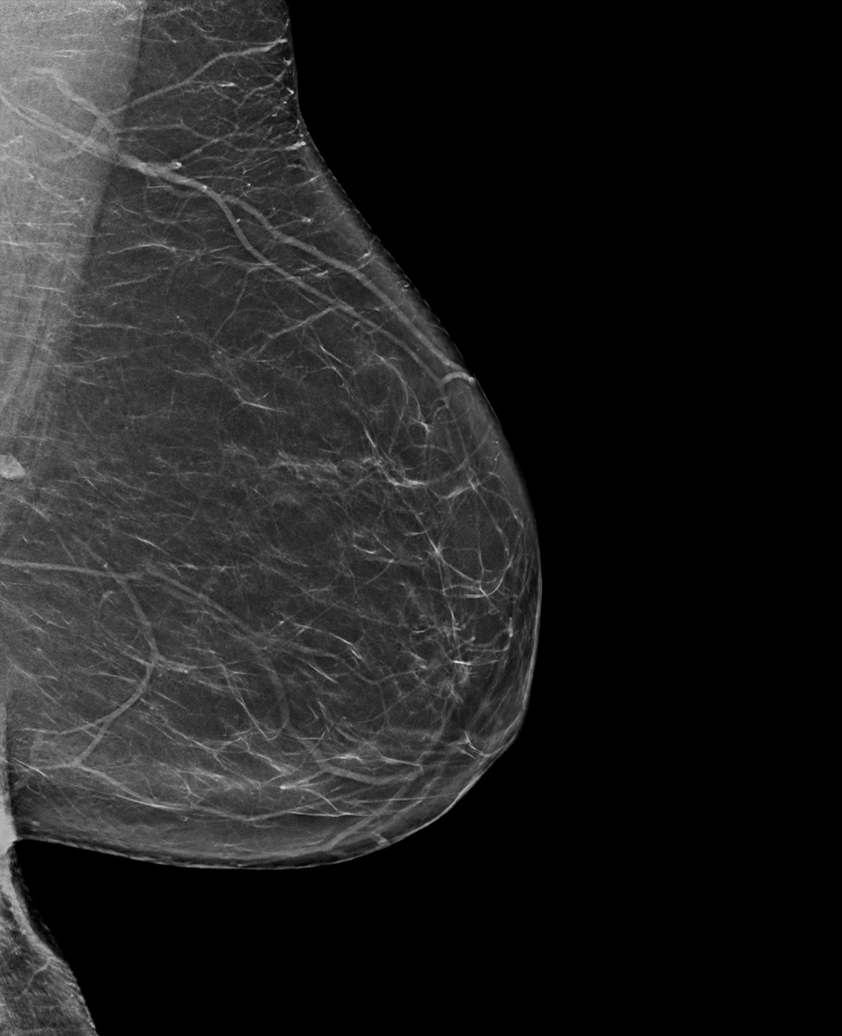

[6 of 36 positions shown; findings below may reference images not displayed]

FINDINGS: There are no findings suspicious for malignancy.
IMPRESSION: No mammographic evidence of malignancy. A result letter of this
screening mammogram will be mailed directly to the patient.

RECOMMENDATION:
Screening mammogram in one year. (Code:0E-3-N98)

BI-RADS CATEGORY  1: Negative.

## 2024-01-06 NOTE — Assessment & Plan Note (Signed)
 Continue crestor . Low cholesterol diet and exercise.  Follow lipid panel and liver function tests.  Lab Results  Component Value Date   CHOL 147 01/01/2024   HDL 57.10 01/01/2024   LDLCALC 69 01/01/2024   LDLDIRECT 187.0 03/26/2021   TRIG 106.0 01/01/2024   CHOLHDL 3 01/01/2024

## 2024-01-06 NOTE — Assessment & Plan Note (Signed)
 Continue lexapro . Reports handling things relatively well. Does not feel needs any further intervention. Follow.

## 2024-01-06 NOTE — Assessment & Plan Note (Signed)
 Continue crestor . Continue risk factor modification. Currently without symptoms

## 2024-01-06 NOTE — Assessment & Plan Note (Signed)
 Blood pressure as outlined. Continues on lisinopril . Follow pressures. Follow metabolic panel.

## 2024-01-06 NOTE — Assessment & Plan Note (Signed)
 No erythema. Canal clear. Started flonase  yesterday. Some better today. Continue flonase . Denies pain - TMJ. Follow.

## 2024-01-06 NOTE — Assessment & Plan Note (Signed)
Continues on nexium.  No upper symptoms reported.

## 2024-01-06 NOTE — Assessment & Plan Note (Signed)
 Breathing stable.

## 2024-01-06 NOTE — Assessment & Plan Note (Signed)
 Continue trulicity , farxiga  and metformin . Reviewed recent labs. Continue low carb diet and exercise.  Lab Results  Component Value Date   HGBA1C 6.0 01/01/2024

## 2024-01-18 ENCOUNTER — Other Ambulatory Visit: Payer: Self-pay

## 2024-01-29 ENCOUNTER — Other Ambulatory Visit: Payer: Self-pay | Admitting: Neurology

## 2024-03-26 ENCOUNTER — Encounter: Payer: Self-pay | Admitting: Internal Medicine

## 2024-03-27 NOTE — Telephone Encounter (Signed)
 Pa needed for Trulicity 

## 2024-03-29 ENCOUNTER — Other Ambulatory Visit (HOSPITAL_COMMUNITY): Payer: Self-pay

## 2024-03-29 ENCOUNTER — Telehealth: Payer: Self-pay

## 2024-03-29 NOTE — Telephone Encounter (Signed)
 Pharmacy Patient Advocate Encounter   Received notification from Physician's Office that prior authorization for Trulicity  3 mg/0.5 ml is required/requested.   Insurance verification completed.   The patient is insured through [employer].   Per test claim: The current 28 day co-pay is, $40.  No PA needed at this time. This test claim was processed through [facility]- copay amounts may vary at other pharmacies due to pharmacy/plan contracts, or as the patient moves through the different stages of their insurance plan.

## 2024-04-01 NOTE — Telephone Encounter (Signed)
 Called. She will let us  know if she needs anything.

## 2024-04-05 ENCOUNTER — Other Ambulatory Visit: Payer: Self-pay | Admitting: Internal Medicine

## 2024-04-05 DIAGNOSIS — E1159 Type 2 diabetes mellitus with other circulatory complications: Secondary | ICD-10-CM

## 2024-05-03 ENCOUNTER — Other Ambulatory Visit

## 2024-05-07 ENCOUNTER — Ambulatory Visit: Admitting: Internal Medicine

## 2024-06-18 ENCOUNTER — Ambulatory Visit: Admitting: Neurology
